# Patient Record
Sex: Female | Born: 1945
Health system: Southern US, Community
[De-identification: ages and names within clinical notes are randomized; demographics above are authoritative.]

## PROBLEM LIST (undated history)

## (undated) DIAGNOSIS — E785 Hyperlipidemia, unspecified: Secondary | ICD-10-CM

## (undated) DIAGNOSIS — I4719 Other supraventricular tachycardia: Secondary | ICD-10-CM

## (undated) DIAGNOSIS — F419 Anxiety disorder, unspecified: Secondary | ICD-10-CM

## (undated) DIAGNOSIS — Z5189 Encounter for other specified aftercare: Secondary | ICD-10-CM

## (undated) DIAGNOSIS — M1990 Unspecified osteoarthritis, unspecified site: Secondary | ICD-10-CM

## (undated) DIAGNOSIS — I10 Essential (primary) hypertension: Secondary | ICD-10-CM

## (undated) DIAGNOSIS — N1831 Chronic kidney disease, stage 3a: Secondary | ICD-10-CM

## (undated) DIAGNOSIS — IMO0002 Reserved for concepts with insufficient information to code with codable children: Secondary | ICD-10-CM

## (undated) DIAGNOSIS — K2270 Barrett's esophagus without dysplasia: Secondary | ICD-10-CM

## (undated) DIAGNOSIS — K219 Gastro-esophageal reflux disease without esophagitis: Secondary | ICD-10-CM

## (undated) DIAGNOSIS — M810 Age-related osteoporosis without current pathological fracture: Secondary | ICD-10-CM

## (undated) DIAGNOSIS — I491 Atrial premature depolarization: Secondary | ICD-10-CM

## (undated) DIAGNOSIS — I517 Cardiomegaly: Secondary | ICD-10-CM

## (undated) DIAGNOSIS — I2510 Atherosclerotic heart disease of native coronary artery without angina pectoris: Secondary | ICD-10-CM

## (undated) DIAGNOSIS — N200 Calculus of kidney: Secondary | ICD-10-CM

## (undated) DIAGNOSIS — H269 Unspecified cataract: Secondary | ICD-10-CM

## (undated) DIAGNOSIS — K449 Diaphragmatic hernia without obstruction or gangrene: Secondary | ICD-10-CM

## (undated) DIAGNOSIS — D689 Coagulation defect, unspecified: Secondary | ICD-10-CM

## (undated) HISTORY — DX: Diaphragmatic hernia without obstruction or gangrene: K44.9

## (undated) HISTORY — DX: Unspecified osteoarthritis, unspecified site: M19.90

## (undated) HISTORY — DX: Anxiety disorder, unspecified: F41.9

## (undated) HISTORY — DX: Chronic kidney disease, stage 3a: N18.31

## (undated) HISTORY — DX: Barrett's esophagus without dysplasia: K22.70

## (undated) HISTORY — DX: Age-related osteoporosis without current pathological fracture: M81.0

## (undated) HISTORY — PX: COLOSTOMY: SHX63

## (undated) HISTORY — DX: Unspecified cataract: H26.9

## (undated) HISTORY — PX: ABDOMINAL HYSTERECTOMY: SHX81

## (undated) HISTORY — DX: Other supraventricular tachycardia: I47.19

## (undated) HISTORY — DX: Atrial premature depolarization: I49.1

## (undated) HISTORY — PX: UPPER GASTROINTESTINAL ENDOSCOPY: SHX188

## (undated) HISTORY — PX: LITHOTRIPSY: SUR834

## (undated) HISTORY — DX: Cardiomegaly: I51.7

## (undated) HISTORY — DX: Encounter for other specified aftercare: Z51.89

## (undated) HISTORY — DX: Essential (primary) hypertension: I10

## (undated) HISTORY — DX: Hyperlipidemia, unspecified: E78.5

## (undated) HISTORY — DX: Atherosclerotic heart disease of native coronary artery without angina pectoris: I25.10

## (undated) HISTORY — PX: EYE SURGERY: SHX253

## (undated) HISTORY — DX: Calculus of kidney: N20.0

## (undated) HISTORY — DX: Reserved for concepts with insufficient information to code with codable children: IMO0002

## (undated) HISTORY — DX: Coagulation defect, unspecified: D68.9

## (undated) HISTORY — PX: HERNIA REPAIR: SHX51

---
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
## 2007-03-26 HISTORY — PX: CATARACT EXTRACTION: SUR2

## 2009-02-04 ENCOUNTER — Other Ambulatory Visit: Payer: Self-pay | Admitting: Emergency Medicine

## 2009-02-04 ENCOUNTER — Encounter: Payer: Self-pay | Admitting: Internal Medicine

## 2009-02-04 LAB — CONVERTED CEMR LAB
ALT: 20 units/L
Albumin: 4.3 g/dL
Alkaline Phosphatase: 101 units/L
Basophils Relative: 0 %
CO2: 40 meq/L
Creatinine, Ser: 1.5 mg/dL
Eosinophils Relative: 2 %
HCT: 47.9 %
Neutrophils Relative %: 71 %
RBC: 5.2 M/uL
Total Bilirubin: 0.7 mg/dL
WBC: 10.7 10*3/uL

## 2009-02-05 ENCOUNTER — Inpatient Hospital Stay (HOSPITAL_COMMUNITY): Admission: EM | Admit: 2009-02-05 | Discharge: 2009-02-07 | Payer: Self-pay | Admitting: Internal Medicine

## 2009-02-05 ENCOUNTER — Other Ambulatory Visit: Payer: Self-pay | Admitting: Emergency Medicine

## 2009-02-05 ENCOUNTER — Encounter: Payer: Self-pay | Admitting: Internal Medicine

## 2009-02-05 DIAGNOSIS — R55 Syncope and collapse: Secondary | ICD-10-CM

## 2009-02-05 LAB — CONVERTED CEMR LAB
BUN: 21 mg/dL
Chloride: 93 meq/L
Glucose, Bld: 111 mg/dL
MCV: 92.2 fL
Platelets: 288 10*3/uL
TSH: 2.532 microintl units/mL

## 2009-02-06 ENCOUNTER — Encounter (INDEPENDENT_AMBULATORY_CARE_PROVIDER_SITE_OTHER): Payer: Self-pay | Admitting: Internal Medicine

## 2009-02-06 ENCOUNTER — Ambulatory Visit: Payer: Self-pay | Admitting: Vascular Surgery

## 2009-02-14 ENCOUNTER — Encounter: Payer: Self-pay | Admitting: Internal Medicine

## 2009-02-14 DIAGNOSIS — K449 Diaphragmatic hernia without obstruction or gangrene: Secondary | ICD-10-CM | POA: Insufficient documentation

## 2009-02-14 DIAGNOSIS — I10 Essential (primary) hypertension: Secondary | ICD-10-CM | POA: Insufficient documentation

## 2009-02-20 ENCOUNTER — Encounter (INDEPENDENT_AMBULATORY_CARE_PROVIDER_SITE_OTHER): Payer: Self-pay | Admitting: *Deleted

## 2009-02-20 ENCOUNTER — Ambulatory Visit: Payer: Self-pay | Admitting: Internal Medicine

## 2009-02-20 DIAGNOSIS — K219 Gastro-esophageal reflux disease without esophagitis: Secondary | ICD-10-CM | POA: Insufficient documentation

## 2009-02-20 DIAGNOSIS — Z87442 Personal history of urinary calculi: Secondary | ICD-10-CM | POA: Insufficient documentation

## 2009-02-20 DIAGNOSIS — F4321 Adjustment disorder with depressed mood: Secondary | ICD-10-CM

## 2009-02-20 DIAGNOSIS — Z9189 Other specified personal risk factors, not elsewhere classified: Secondary | ICD-10-CM | POA: Insufficient documentation

## 2009-02-21 ENCOUNTER — Encounter (INDEPENDENT_AMBULATORY_CARE_PROVIDER_SITE_OTHER): Payer: Self-pay | Admitting: *Deleted

## 2009-03-13 ENCOUNTER — Telehealth: Payer: Self-pay | Admitting: Internal Medicine

## 2009-03-23 ENCOUNTER — Ambulatory Visit: Payer: Self-pay | Admitting: Internal Medicine

## 2009-03-25 HISTORY — PX: HIATAL HERNIA REPAIR: SHX195

## 2009-03-29 ENCOUNTER — Telehealth: Payer: Self-pay | Admitting: Gastroenterology

## 2009-03-29 ENCOUNTER — Encounter: Payer: Self-pay | Admitting: Internal Medicine

## 2009-04-12 ENCOUNTER — Ambulatory Visit: Payer: Self-pay | Admitting: Gastroenterology

## 2009-04-14 ENCOUNTER — Ambulatory Visit: Payer: Self-pay | Admitting: Gastroenterology

## 2009-04-18 ENCOUNTER — Encounter: Payer: Self-pay | Admitting: Gastroenterology

## 2009-04-20 ENCOUNTER — Telehealth: Payer: Self-pay | Admitting: Internal Medicine

## 2009-04-21 ENCOUNTER — Inpatient Hospital Stay (HOSPITAL_COMMUNITY): Admission: AD | Admit: 2009-04-21 | Discharge: 2009-04-25 | Payer: Self-pay | Admitting: Surgery

## 2009-05-12 ENCOUNTER — Ambulatory Visit: Payer: Self-pay | Admitting: Gastroenterology

## 2009-05-17 ENCOUNTER — Encounter: Payer: Self-pay | Admitting: Gastroenterology

## 2009-06-30 ENCOUNTER — Encounter: Payer: Self-pay | Admitting: Internal Medicine

## 2009-10-03 ENCOUNTER — Ambulatory Visit: Payer: Self-pay | Admitting: Internal Medicine

## 2009-10-31 ENCOUNTER — Ambulatory Visit: Payer: Self-pay | Admitting: Internal Medicine

## 2010-03-12 ENCOUNTER — Encounter: Payer: Self-pay | Admitting: Gastroenterology

## 2010-04-19 ENCOUNTER — Encounter: Payer: Self-pay | Admitting: Gastroenterology

## 2010-04-24 NOTE — Assessment & Plan Note (Signed)
Summary: 4 wk fu  stc   Vital Signs:  Patient profile:   65 year old female Height:      69 inches (175.26 cm) Weight:      195.12 pounds (88.69 kg) O2 Sat:      96 % on Room air Temp:     98.9 degrees F (37.17 degrees C) oral Pulse rate:   72 / minute BP sitting:   132 / 76  (left arm) Cuff size:   large  Vitals Entered By: Orlan Leavens RMA (October 31, 2009 3:53 PM)  O2 Flow:  Room air CC: 4 week follow-up Is Patient Diabetic? No Pain Assessment Patient in pain? no        Primary Care Provider:  Newt Lukes MD  CC:  4 week follow-up.  History of Present Illness: here for f/u -  HTN - reports compliance with ongoing medical treatment and no changes in medication dose or frequency. denies adverse side effects related to current therapy. believes usually controlled but now up due to stress - see next  insomnia  - worse with stress (moving demented spouse to nursing facility s/p hospitalization) - also increase stressors also at work - using temp xanax with good relief of symptoms -denies depression - no unexplained sadness, deep despair or unexplained tearfulness -  GERD - surg for [facility] reviewed - no daily symptoms - no meds at this time - for GI f/u soon  Clinical Review Panels:  CBC   WBC:  9.6 (02/05/2009)   RBC:  4.76 (02/05/2009)   Hgb:  14.9 (02/05/2009)   Hct:  43.9 (02/05/2009)   Platelets:  288 (02/05/2009)   MCV  92.2 (02/05/2009)   RDW  13.8 (02/05/2009)   PMN:  71 (02/04/2009)   Monos:  10 (02/04/2009)   Eosinophils:  2 (02/04/2009)   Basophil:  0 (02/04/2009)  Complete Metabolic Panel   Glucose:  111 (02/05/2009)   Sodium:  142 (02/05/2009)   Potassium:  3.3 (02/05/2009)   Chloride:  93 (02/05/2009)   CO2:  40 (02/05/2009)   BUN:  21 (02/05/2009)   Creatinine:  1.67 (02/05/2009)   Albumin:  4.3 (02/04/2009)   Total Protein:  8.0 (02/04/2009)   Calcium:  9.3 (02/05/2009)   Total Bili:  0.7 (02/04/2009)   Alk Phos:  101 (02/04/2009)  SGPT (ALT):  20 (02/04/2009)   SGOT (AST):  22 (02/04/2009)   Current Medications (verified): 1)  Amlodipine Besylate 2.5 Mg Tabs (Amlodipine Besylate) .Marland Kitchen.. 1 By Mouth Once Daily 2)  Alprazolam 0.5 Mg Tabs (Alprazolam) .Marland Kitchen.. 1 By Mouth Three Times A Day As Needed  Allergies (verified): No Known Drug Allergies  Past History:  Past Medical History: Hypertension GERD with HH (large)   MD roster: GI - [facility] - [facility]in  Review of Systems  The patient denies fever, vision loss, chest pain, and headaches.    Physical Exam  General:  alert, well-developed, well-nourished, and cooperative to examination.    Lungs:  normal respiratory effort, no intercostal retractions or use of accessory muscles; normal breath sounds bilaterally - no crackles and no wheezes.    Heart:  normal rate, regular rhythm, no murmur, and no rub. BLE without edema.  Psych:  Oriented X3, memory intact for recent and remote, normally interactive, good eye contact, not anxious appearing, not depressed appearing, and not agitated.     Impression & Recommendations:  Problem # 1:  HYPERTENSION (ICD-401.9) Assessment Improved  Her updated medication list for  this problem includes:    Amlodipine Besylate 2.5 Mg Tabs (Amlodipine besylate) .Marland Kitchen... 1 by mouth once daily  improved from pretx but inc now - ?stress related -  if not improved next OV, consider titrate amlodipine, add diuretic or other antiHTN med d/w pt who agrees  BP today: 132/76 Prior BP: 148/92 (10/03/2009)  Labs Reviewed: K+: 3.3 (02/05/2009) Creat: : 1.67 (02/05/2009)     Problem # 2:  DEPRESSION, SITUATIONAL (ICD-309.0)  sig stress and anxiety re: decline in spouse health and memory  previously stopped SSRI as not helpful and was not taking - (celexa) now using short term xanax  as needed - support provided  Complete Medication List: 1)  Amlodipine Besylate 2.5 Mg Tabs (Amlodipine besylate) .Marland Kitchen.. 1 by mouth once daily 2)   Alprazolam 0.5 Mg Tabs (Alprazolam) .Marland Kitchen.. 1 by mouth three times a day as needed  Patient Instructions: 1)  it was good to see you today. 2)  continue the low dose temporary xanax to use as discussed for stress and sleep - use as needed  - 3)  blood pressure looks better today - keep on amlodipine for blood pressure control 4)  Please schedule a follow-up appointment in 3-36months  to recheck blood pressure and stress, call sooner if problems.

## 2010-04-24 NOTE — Letter (Signed)
Summary: [facility]   Imported By: Lester Jermyn 07/17/2009 10:37:23  _____________________________________________________________________  External Attachment:    Type:   Image     Comment:   External Document

## 2010-04-24 NOTE — Assessment & Plan Note (Signed)
Summary: PER PT 6 MTH FU  STC  RS'D PER PT/NWS   Vital Signs:  Patient profile:   65 year old female Height:      69 inches (175.26 cm) Weight:      194.12 pounds (88.24 kg) O2 Sat:      95 % on Room air Temp:     98.8 degrees F (37.11 degrees C) oral Pulse rate:   66 / minute BP sitting:   148 / 92  (left arm) Cuff size:   large  Vitals Entered By: Orlan Leavens (October 03, 2009 3:46 PM)  O2 Flow:  Room air CC: 6 month follow-up Is Patient Diabetic? No Pain Assessment Patient in pain? no        Primary Care Provider:  Newt Lukes MD  CC:  6 month follow-up.  History of Present Illness: here for f/u -  HTN - reports compliance with ongoing medical treatment and no changes in medication dose or frequency. denies adverse side effects related to current therapy. believes usually controlled but now up due to stress - see next  insomnia  - worse in last few weeks with stress - moving demented spouse to nursing facility - increase stressors also at work - has uses temp xanax in past with good relief of symptoms - ?try again now - demnies depression - no unexplained sadness, deep despair or unexplained tearfulness -  GERD - surg for [facility] reviewed - no daily symptoms - no meds at this time - for GI f/u soon  Clinical Review Panels:  CBC   WBC:  9.6 (02/05/2009)   RBC:  4.76 (02/05/2009)   Hgb:  14.9 (02/05/2009)   Hct:  43.9 (02/05/2009)   Platelets:  288 (02/05/2009)   MCV  92.2 (02/05/2009)   RDW  13.8 (02/05/2009)   PMN:  71 (02/04/2009)   Monos:  10 (02/04/2009)   Eosinophils:  2 (02/04/2009)   Basophil:  0 (02/04/2009)  Complete Metabolic Panel   Glucose:  111 (02/05/2009)   Sodium:  142 (02/05/2009)   Potassium:  3.3 (02/05/2009)   Chloride:  93 (02/05/2009)   CO2:  40 (02/05/2009)   BUN:  21 (02/05/2009)   Creatinine:  1.67 (02/05/2009)   Albumin:  4.3 (02/04/2009)   Total Protein:  8.0 (02/04/2009)   Calcium:  9.3 (02/05/2009)   Total Bili:  0.7  (02/04/2009)   Alk Phos:  101 (02/04/2009)   SGPT (ALT):  20 (02/04/2009)   SGOT (AST):  22 (02/04/2009)   Current Medications (verified): 1)  Amlodipine Besylate 2.5 Mg Tabs (Amlodipine Besylate) .Marland Kitchen.. 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    Physical Exam  General:  alert, well-developed, well-nourished, and cooperative to examination.    Lungs:  normal respiratory effort, no intercostal retractions or use of accessory muscles; normal breath sounds bilaterally - no crackles and no wheezes.    Heart:  normal rate, regular rhythm, no murmur, and no rub. BLE without edema.  Psych:  Oriented X3, memory intact for recent and remote, normally interactive, good eye contact, not anxious appearing, not depressed appearing, and not agitated.   occ tearful discussing situation with spouse moving to nursing home   Impression & Recommendations:  Problem # 1:  HYPERTENSION (ICD-401.9) improved from pretx but inc now - ?stress related - tx anxiety/stress adn recheckfew weeks - if not improved titrate amlodipine, add diuretic or other antiHTN med d/w pt who agrees Her updated medication list for this problem includes:    Amlodipine Besylate  2.5 Mg Tabs (Amlodipine besylate) .Marland Kitchen... 1 by mouth once daily  BP today: 148/92 Prior BP: 130/62 (05/12/2009)  Labs Reviewed: K+: 3.3 (02/05/2009) Creat: : 1.67 (02/05/2009)     Problem # 2:  DEPRESSION, SITUATIONAL (ICD-309.0) sig stress and anxiety re: decline in spouse health and memory  prev stop SSRI as not helpful and was not taking - (celexa) short term xanax to use as needed - support provided  Problem # 3:  GERD (ICD-530.81)  dx by symptoms and CT changes -  s/p hernia surg by  dr. Daphine Deutscher 05/2009 -  f/u GI as planned  Complete Medication List: 1)  Amlodipine Besylate 2.5 Mg Tabs (Amlodipine besylate) .Marland Kitchen.. 1 by mouth once daily 2)  Alprazolam 0.5 Mg Tabs (Alprazolam) .Marland Kitchen.. 1 by mouth three times a day as needed  Patient Instructions: 1)  it was good to see you today. 2)  temporary xanax to use as discussed for stress and sleep - use as needed  - your prescription has been given to submit to your pharmacy. Please take as directed. Contact our office if you believe you're having problems with the medication(s).  3)  Please schedule a follow-up appointment in 4 weeks to recheck blood pressure, sooner if problems.  4)  good luck Prescriptions: ALPRAZOLAM 0.5 MG TABS (ALPRAZOLAM) 1 by mouth three times a day as needed  #40 x 1   Entered and Authorized by:   Newt Lukes MD   Signed by:   Newt Lukes MD on 10/03/2009   Method used:   Print then Give to Patient   RxID:   [id number]

## 2010-04-24 NOTE — Letter (Signed)
Summary: [facility]   Imported By: Sherian Rein 05/31/2009 14:19:28  _____________________________________________________________________  External Attachment:    Type:   Image     Comment:   External Document

## 2010-04-24 NOTE — Progress Notes (Signed)
Summary: ? egd  Phone Note Call from Patient Call back at [phone]   Caller: Patient Call For: Sara [name] Summary of Call: Dr. Daphine Deutscher and he suggested that she has a repeat EGD befre he repairs her [facility]. She would like to set that up. I that Sara [name] will have to get the note from [facility] and have Dr. Christella Hartigan look at it then give her a call back.  Initial call taken by: Harlow Mares CMA (AAMA),  March 29, 2009 12:58 PM  Follow-up for Phone Call        I spoke with the pt and she is going to have [facility] send over her records for Dr Christella Hartigan review. Follow-up by: Chales Abrahams CMA Duncan Dull),  March 29, 2009 1:48 PM     Appended Document: ? egd received faxed note from Dr. Daphine Deutscher about hiatal hernia repair surgery and ? of EGD before the surgery.  I see she has cancelled apts with myself last week and with Dr. Sheryn Bison 2-3 months ago.  Please call her, ask if she is still interested in office visit to discuss EGD.  Appended Document: ? egd appt scheduled for 04/12/09

## 2010-04-24 NOTE — Assessment & Plan Note (Signed)
  Review of gastrointestinal problems: 1. Barrett's esophagus, 10 cm, diagnosed originally January 2011. Biopsies showed intestinal metaplasia without dysplasia. Next EGD January 2012 2. large hiatal hernia, surgically repaired January 28th, 2011.  This significantly improved her SOB, energy level, eating more normally (although only on puree's still).    History of Present Illness Visit Type: Follow-up Visit Primary GI MD: Rob Bunting MD Primary Provider: Newt Lukes MD Requesting Provider: Renato Battles Chief Complaint: 4-5 week f/u History of Present Illness:      Very pleasant 65 year old woman whom I last saw about a month ago time of her EGD, preoperative before her very large symptomatic hiatal hernia was repaired by Dr. Luretha Murphy.  She has felt very well since the surgery however her diet has not been advanced to normal solid foods yet. She is much less short of breath, her stomach was impinging on her lung function.           Current Medications (verified): 1)  Amlodipine Besylate 2.5 Mg Tabs (Amlodipine Besylate) .Marland Kitchen.. 1 By Mouth Once Daily  Allergies (verified): No Known Drug Allergies  Vital Signs:  Patient profile:   65 year old female Height:      69 inches Weight:      176.50 pounds BMI:     26.16 Pulse rate:   72 / minute Pulse rhythm:   regular BP sitting:   130 / 62  (left arm)  Vitals Entered By: Chales Abrahams CMA Duncan Dull) (May 12, 2009 2:47 PM)  Physical Exam  Additional Exam:  Constitutional: generally well appearing Psychiatric: alert and oriented times 3 Abdomen: soft, non-tender, non-distended, normal bowel sounds    Impression & Recommendations:  Problem # 1:  Barrett's esophagus she has a long segment of Barrett's esophagus. I discussed with her that this probably puts her at increased risk for esophageal cancer and told her that this was probably about 0.5% per year. She understands that there is debate about how to  follow people with Barrett's esophagus such as hers and I recommended that we repeat her upper endoscopy at one year from now. At that point if no dysplasia is noted then I would probably put her out to a 3 year interval. She understands to call sooner if she has any concerns including worsening dysphasia, unexplained weight loss.  Patient Instructions: 1)  You will have repeat EGD in one year. 2)  Call Dr. Christella Hartigan office sooner if any concerns arise. 3)  A copy of this information will be sent to Drs. Maryjean Morn. 4)  The medication list was reviewed and reconciled.  All changed / newly prescribed medications were explained.  A complete medication list was provided to the patient / caregiver.

## 2010-04-24 NOTE — Letter (Signed)
Summary: EGD Instructions  [facility]  [address] [place], [place] [address]   Phone: [phone]  Fax: [phone]       Sara [name]    March 13, 1946    MRN: [id number]       Procedure Day /Date:04/14/09     Arrival Time: 730 am     Procedure Time:830 am     Location of Procedure:                    [facility] ([facility])   PREPARATION FOR ENDOSCOPY   On1/21/11THE DAY OF THE PROCEDURE:  1.   No solid foods, milk or milk products are allowed after midnight the night before your procedure.  2.   Do not drink anything colored red or purple.  Avoid juices with pulp.  No orange juice.  3.  You may drink clear liquids until630 am, which is 2 hours before your procedure.                                                                                                CLEAR LIQUIDS INCLUDE: Water Jello Ice Popsicles Tea (sugar ok, no milk/cream) Powdered fruit flavored drinks Coffee (sugar ok, no milk/cream) Gatorade Juice: apple, white grape, white cranberry  Lemonade Clear bullion, consomm, broth Carbonated beverages (any kind) Strained chicken noodle soup Hard Candy   MEDICATION INSTRUCTIONS  Unless otherwise instructed, you should take regular prescription medications with a small sip of water as early as possible the morning of your procedure.               OTHER INSTRUCTIONS  You will need a responsible adult at least 63 years of age to accompany you and drive you home.   This person must remain in the waiting room during your procedure.  Wear loose fitting clothing that is easily removed.  Leave jewelry and other valuables at home.  However, you may wish to bring a book to read or an iPod/MP3 player to listen to music as you wait for your procedure to start.  Remove all body piercing jewelry and leave at home.  Total time from sign-in until discharge is approximately 2-3 hours.  You should go home directly after your  procedure and rest.  You can resume normal activities the day after your procedure.  The day of your procedure you should not:   Drive   Make legal decisions   Operate machinery   Drink alcohol   Return to work  You will receive specific instructions about eating, activities and medications before you leave.    The above instructions have been reviewed and explained to me by   _______________________    I fully understand and can verbalize these instructions _____________________________ Date _________

## 2010-04-24 NOTE — Progress Notes (Signed)
Summary: BP side effect  Phone Note Call from Patient Call back at Home Phone [phone]   Caller: Patient Summary of Call: pt called stating that she has not had any problems with BP meds until this week when her legs started to swell. Pt is scheduled to have surgery tomorrow and had her pre-op appt today and was told that she may need to have BP medications changed. Initial call taken by: Margaret Pyle, CMA,  April 20, 2009 10:06 AM  Follow-up for Phone Call        it may or may not be necessary to change medications -  if anesthesia (preop evaluator) has cancelled her surgery because of this issue, please have pt schedule OV here to review and make changes as needed  -  if she is still on for surg tomorrow, continue medication as is, have surgery and then make OV to review in the week post op - thanks Follow-up by: Newt Lukes MD,  April 20, 2009 10:48 AM  Additional Follow-up for Phone Call Additional follow up Details #1::        pt's surgery is still scheduled for tomorrow. Pt advised to continue meds and make OV. pt will call back after she is released from hospital to schedule Additional Follow-up by: Margaret Pyle, CMA,  April 20, 2009 11:00 AM

## 2010-04-24 NOTE — Letter (Signed)
Summary: Large hiatal hernia/[facility]  Large hiatal hernia/[facility]   Imported By: Sherian Rein 04/10/2009 11:38:21  _____________________________________________________________________  External Attachment:    Type:   Image     Comment:   External Document

## 2010-04-24 NOTE — Miscellaneous (Signed)
Summary: recall EGD  Clinical Lists Changes  Observations: Added new observation of EGD DUE: 04/2010 (05/12/2009 15:07)

## 2010-04-24 NOTE — Letter (Signed)
Summary: Results Letter  [facility]  [address] [place], [place] [address]   Phone: [phone]  Fax: [phone]        April 18, 2009 MRN: [id number]    Sara [name] [address] [place], [place]  [address]    Dear Ms. Sara [name],   The biopsies during your recent procedure showed Barrett's mucosa, but NO sign of the pre-cancerous change called "dysplasia."   Therefore, unless new symptoms arise you will not need another upper endoscopy for 1 year.  We will therefore put your information in our reminder system and will contact you in 1 year to schedule a repeat procedure.  Please call with any questions or concerns.       Sincerely,  Rachael Fee MD  This letter has been electronically signed by your physician.  Appended Document: Results Letter Letter mailed 1.27.11

## 2010-04-24 NOTE — Letter (Signed)
Summary: Appt Reminder 2   [facility]  [address] [place], [place] [address]   Phone: [phone]  Fax: [phone]        April 14, 2009 MRN: [id number]    Sara [name] [address] [place], [place]  [address]    Dear Ms. Sara [name],   You have a return appointment with Dr. Christella Hartigan on 05/12/09 at 3:00pm.  Please remember to bring a complete list of the medicines you are taking, your insurance card and your co-pay.  If you have to cancel or reschedule this appointment, please call before 5:00 pm the evening before to avoid a cancellation fee.  If you have any questions or concerns, please call [phone].    Sincerely,    Chales Abrahams CMA (AAMA)  Appended Document: Appt Reminder 2 letter mailed

## 2010-04-24 NOTE — Assessment & Plan Note (Signed)
History of Present Illness Visit Type: Initial Consult Primary GI MD: Rob Bunting MD Primary Provider: Newt Lukes MD Requesting Provider: Renato Battles Chief Complaint: discuss EGD History of Present Illness:      very pleasant 65 year old woman who has had approximately 3-4 months of increasingly worsening nausea and vomitting. She has modified her diet because solid foods will reliably cause vomitting, regurge.  workup for this revealed a very large hiatal hernia on upper GI barium test. She has approximately 2/3 of her stomach in her chest.  she does have intermittent pyrosis but this is very well controlled on once daily proton pump inhibitor.  She saw a Careers adviser, Dr. Luretha Murphy [facility] who is planning to perform hiatal hernia repair for her next Friday.  he sent her here to consider EGD preoperatively.  Having HH repair on 04/21/09.  on PPI for about 3 months, helps her mild pyrosis.  she has never had EGD before, she has never had colonoscopy for colon cancer screening.  No overt bleeding, no constipation, diarrhea.           Current Medications (verified): 1)  Protonix 40 Mg Tbec (Pantoprazole Sodium) .... Take 1 By Mouth Once Daily 2)  Amlodipine Besylate 2.5 Mg Tabs (Amlodipine Besylate) .Marland Kitchen.. 1 By Mouth Once Daily  Allergies (verified): No Known Drug Allergies  Past History:  Past Medical History: Hypertension GERD with HH (large)  Past Surgical History: Cataract extraction 2007-08-13) Tonsillectomy 1966/08/13)   Family History: Family History Breast cancer 1st degree relative <50 (grandparent) Family History Hypertension (parent) Heart disease (parent & grandparent) mom died 2006-08-13 with "cancer everywhere - bones" - ?stomach or other GI origin suspected but not confirmed before death dad died "massive heart attack"   Social History: Never Smoked married and lives with spouse (who has mild-mod dementia) 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  All other review of systems was otherwise negative.   Vital Signs:  Patient profile:   65 year old female Height:      69 inches Weight:      187 pounds BMI:     27.71 BSA:     2.01 Pulse rate:   88 / minute Pulse rhythm:   regular BP sitting:   122 / 82  (left arm)  Vitals Entered By: Merri Ray CMA (AAMA) (April 12, 2009 8:14 AM)  Physical Exam  Additional Exam:  Constitutional: generally well appearing Psychiatric: alert and oriented times 3 Eyes: extraocular movements intact Mouth: oropharynx moist, no lesions Neck: supple, no lymphadenopathy Cardiovascular: heart regular rate and rythm Lungs: CTA bilaterally Abdomen: soft, non-tender, non-distended, no obvious ascites, no peritoneal signs, normal bowel sounds Extremities: no lower extremity edema bilaterally Skin: no lesions on visible extremities    Impression & Recommendations:  Problem # 1:  Large hiatal hernia she has symptoms of regurgitation, nausea, vomiting that are likely do to this quite large hiatal hernia. I do agree with the plan for surgical repair. I think EGD preoperatively is a very good idea to rule out other pathology of her stomach, ulcers, significant gastritis that could also contribute to her symptoms. We will arrange for EGD to be done later this week.  Problem # 2:  routine risk for colon  cancer I offered colonoscopy for at the same time as her upper endoscopy, she wants to hold on this until after she is through with her hiatal hernia repair. She will contact my office when she is ready to proceed with screening colonoscopy.  Patient Instructions: 1)  You will be scheduled to have an upper endoscopy this Friday. 2)  A copy of this information will be sent to Dr. Daphine Deutscher. 3)  Please  contact Dr. Christella Hartigan office when you are ready to reconsider colonoscopy for colon cancer screening. 4)  The medication list was reviewed and reconciled.  All changed / newly prescribed medications were explained.  A complete medication list was provided to the patient / caregiver.  Appended Document: Orders Update/EGD    Clinical Lists Changes  Orders: Added new Test order of EGD (EGD) - Signed

## 2010-04-24 NOTE — Procedures (Signed)
Summary: Upper Endoscopy  Patient: Sara [name] Note: All result statuses are Final unless otherwise noted.  Tests: (1) Upper Endoscopy (EGD)   EGD Upper Endoscopy       DONE     [facility]     [address]     [place], [place]  [address]           ENDOSCOPY PROCEDURE REPORT           PATIENT:  Sara, [name]  MR#:  [id number]     BIRTHDATE:  1945-10-14, 63 yrs. old  GENDER:  female           ENDOSCOPIST:  Rachael Fee, MD     Referred by:  Luretha Murphy, M.D.           PROCEDURE DATE:  04/14/2009     PROCEDURE:  EGD with biopsy     ASA CLASS:  Class II     INDICATIONS:  GERD, known hiatal hernia, chest pains           MEDICATIONS:  Fentanyl 75 mcg IV, Versed 7 mg IV     TOPICAL ANESTHETIC:  Exactacain Spray           DESCRIPTION OF PROCEDURE:   After the risks benefits and     alternatives of the procedure were thoroughly explained, informed     consent was obtained.  The LB GIF-H180 [id number] endoscope was     introduced through the mouth and advanced to the second portion of     the duodenum, without limitations.  The instrument was slowly     withdrawn as the mucosa was fully examined.     <<PROCEDUREIMAGES>>           There was a very large hiatal hernia, the majority of stomach was     above the diaphragm. There were some minor superficial erosions     within the hernia but no clear ulcers (see image3 and image7).     There was abnormal, salmon colored mucosa from GE junction (33cm)     up to 23cm from incisors. There were no associated nodules or     masses. Biopsies were taken to check for Barrett's mucosda,     dysplasia. These were done in 4 quadrants, every two centimeters,     6 separate pathology jars (see image8, image9, and image1).     Otherwise the examination was normal (see image4, image5, and     image6).    Retroflexed views revealed no abnormalities.    The     scope was then withdrawn from the patient and the procedure      completed.           COMPLICATIONS:  None           ENDOSCOPIC IMPRESSION:     1) Very large hiatal hernia, majority of stomach is above     diaphragm     2) Long segment of non-nodular, Barrett's appearing mucosa;     extensively biopseid to check for intestinal metaplasia and     dysplasia     3) Otherwise normal examination           RECOMMENDATIONS:     1) OK to proceed with hiatal hernia repair next week with Dr.     Daphine Deutscher     2) Await final pathology to determin follow up EGD interval for     (likely) Barrett's mucosa     3)  Dr. Christella Hartigan office will contact you about follow up office appt     in 4-5 weeks (will discuss surveillance strategies for Barrett's,     other treatment options).           ______________________________     Rachael Fee, MD           cc: Rene Paci, MD           n.     eSIGNED:   Rachael Fee at 04/14/2009 09:04 AM           Alessandra Bevels, [id number]  Note: An exclamation mark (!) indicates a result that was not dispersed into the flowsheet. Document Creation Date: 04/14/2009 9:05 AM _______________________________________________________________________  (1) Order result status: Final Collection or observation date-time: 04/14/2009 08:55 Requested date-time:  Receipt date-time:  Reported date-time:  Referring Physician:   Ordering Physician: Rob Bunting [phone]) Specimen Source:  Source: Launa Grill Order Number: [id number] Lab site:

## 2010-04-26 NOTE — Letter (Signed)
Summary: Endoscopy Letter  [facility]  [address] [place], [place] [address]   Phone: [phone]  Fax: [phone]      April 19, 2010 MRN: [id number]   Sara [name] [address] [place], [place]  [address]   Dear Ms. Sara [name],   According to your medical record, it is time for you to schedule an Endoscopy. Endoscopic screening is recommended for patients with certain upper digestive tract conditions because of associated increased risk for cancers of the upper digestive system.  This letter has been generated based on the recommendations made at the time of your prior procedure. If you feel that in your particular situation this may no longer apply, please contact our office.  Please call our office at [phone]) to schedule this appointment or to update your records at your earliest convenience.  Thank you for cooperating with [place] to provide you with the very best care possible.   Sincerely,  Rachael Fee, M.D.  [facility] [phone]

## 2010-05-02 NOTE — Procedures (Signed)
Summary: Recall Assessment/[facility]  Recall Assessment/[facility]   Imported By: Sherian Rein 04/23/2010 11:53:28  _____________________________________________________________________  External Attachment:    Type:   Image     Comment:   External Document

## 2010-05-29 ENCOUNTER — Ambulatory Visit: Payer: Self-pay | Admitting: Internal Medicine

## 2010-06-11 LAB — BASIC METABOLIC PANEL
BUN: 12 mg/dL (ref 6–23)
Chloride: 106 mEq/L (ref 96–112)
Glucose, Bld: 95 mg/dL (ref 70–99)
Potassium: 4.3 mEq/L (ref 3.5–5.1)

## 2010-06-11 LAB — CBC
HCT: 35.1 % — ABNORMAL LOW (ref 36.0–46.0)
MCHC: 33.1 g/dL (ref 30.0–36.0)
MCV: 91.8 fL (ref 78.0–100.0)
Platelets: 206 10*3/uL (ref 150–400)
WBC: 10.4 10*3/uL (ref 4.0–10.5)

## 2010-06-11 LAB — DIFFERENTIAL
Eosinophils Absolute: 0 10*3/uL (ref 0.0–0.7)
Lymphocytes Relative: 6 % — ABNORMAL LOW (ref 12–46)
Lymphs Abs: 0.6 10*3/uL — ABNORMAL LOW (ref 0.7–4.0)
Monocytes Relative: 7 % (ref 3–12)
Neutrophils Relative %: 87 % — ABNORMAL HIGH (ref 43–77)

## 2010-06-27 LAB — DIFFERENTIAL
Basophils Relative: 0 % (ref 0–1)
Eosinophils Absolute: 0.2 10*3/uL (ref 0.0–0.7)
Neutro Abs: 7.7 10*3/uL (ref 1.7–7.7)
Neutrophils Relative %: 71 % (ref 43–77)

## 2010-06-27 LAB — CBC
MCHC: 33.8 g/dL (ref 30.0–36.0)
MCV: 92 fL (ref 78.0–100.0)
Platelets: 319 10*3/uL (ref 150–400)
RBC: 4.76 MIL/uL (ref 3.87–5.11)
RBC: 5.2 MIL/uL — ABNORMAL HIGH (ref 3.87–5.11)
RDW: 12.7 % (ref 11.5–15.5)
WBC: 9.6 10*3/uL (ref 4.0–10.5)

## 2010-06-27 LAB — CARDIAC PANEL(CRET KIN+CKTOT+MB+TROPI)
CK, MB: 1 ng/mL (ref 0.3–4.0)
CK, MB: 1.1 ng/mL (ref 0.3–4.0)
Relative Index: INVALID (ref 0.0–2.5)
Total CK: 64 U/L (ref 7–177)
Total CK: 71 U/L (ref 7–177)
Total CK: 81 U/L (ref 7–177)
Troponin I: 0.01 ng/mL (ref 0.00–0.06)

## 2010-06-27 LAB — COMPREHENSIVE METABOLIC PANEL
ALT: 20 U/L (ref 0–35)
AST: 20 U/L (ref 0–37)
Albumin: 3.6 g/dL (ref 3.5–5.2)
BUN: 21 mg/dL (ref 6–23)
BUN: 26 mg/dL — ABNORMAL HIGH (ref 6–23)
CO2: 40 mEq/L — ABNORMAL HIGH (ref 19–32)
CO2: 40 mEq/L — ABNORMAL HIGH (ref 19–32)
Calcium: 9.3 mg/dL (ref 8.4–10.5)
Chloride: 91 mEq/L — ABNORMAL LOW (ref 96–112)
Chloride: 93 mEq/L — ABNORMAL LOW (ref 96–112)
Creatinine, Ser: 1.5 mg/dL — ABNORMAL HIGH (ref 0.4–1.2)
Creatinine, Ser: 1.67 mg/dL — ABNORMAL HIGH (ref 0.4–1.2)
GFR calc Af Amer: 43 mL/min — ABNORMAL LOW (ref >60)
GFR calc non Af Amer: 31 mL/min — ABNORMAL LOW (ref >60)
GFR calc non Af Amer: 35 mL/min — ABNORMAL LOW (ref >60)
Glucose, Bld: 146 mg/dL — ABNORMAL HIGH (ref 70–99)
Potassium: 3 mEq/L — ABNORMAL LOW (ref 3.5–5.1)
Potassium: 3.3 mEq/L — ABNORMAL LOW (ref 3.5–5.1)
Total Bilirubin: 0.7 mg/dL (ref 0.3–1.2)

## 2010-06-27 LAB — POCT CARDIAC MARKERS
Troponin i, poc: <0.05 ng/mL (ref 0.00–0.09)
Troponin i, poc: <0.05 ng/mL (ref 0.00–0.09)

## 2010-06-27 LAB — TSH: TSH: 2.532 u[IU]/mL (ref 0.350–4.500)

## 2010-06-27 LAB — LIPASE, BLOOD: Lipase: 25 U/L (ref 11–59)

## 2010-07-31 ENCOUNTER — Ambulatory Visit: Payer: Self-pay | Admitting: Internal Medicine

## 2010-08-06 ENCOUNTER — Ambulatory Visit: Payer: Self-pay | Admitting: Internal Medicine

## 2010-09-24 ENCOUNTER — Encounter: Payer: Self-pay | Admitting: Internal Medicine

## 2010-11-07 ENCOUNTER — Ambulatory Visit: Payer: Self-pay | Admitting: Internal Medicine

## 2011-01-21 ENCOUNTER — Ambulatory Visit: Payer: Self-pay | Admitting: Internal Medicine

## 2011-04-23 LAB — BASIC METABOLIC PANEL
Creatinine: 0.9 mg/dL (ref 0.5–1.1)
Glucose: 98 mg/dL
Potassium: 4.3 mmol/L (ref 3.4–5.3)

## 2011-04-23 LAB — TSH: TSH: 1.19 u[IU]/mL (ref 0.41–5.90)

## 2011-04-23 LAB — HEPATIC FUNCTION PANEL
ALT: 13 U/L (ref 7–35)
AST: 14 U/L (ref 13–35)
Bilirubin, Total: 0.5 mg/dL

## 2011-04-23 LAB — CBC AND DIFFERENTIAL: Platelets: 450 10*3/uL — AB (ref 150–399)

## 2011-04-23 LAB — HEMOGLOBIN A1C: Hgb A1c MFr Bld: 5.7 % (ref 4.0–6.0)

## 2011-09-11 ENCOUNTER — Ambulatory Visit: Payer: Self-pay | Admitting: Internal Medicine

## 2011-10-16 ENCOUNTER — Ambulatory Visit: Payer: Self-pay | Admitting: Internal Medicine

## 2011-12-06 ENCOUNTER — Encounter: Payer: Self-pay | Admitting: Gastroenterology

## 2012-02-07 ENCOUNTER — Encounter: Payer: Self-pay | Admitting: Internal Medicine

## 2012-02-07 ENCOUNTER — Ambulatory Visit (INDEPENDENT_AMBULATORY_CARE_PROVIDER_SITE_OTHER): Payer: PRIVATE HEALTH INSURANCE | Admitting: Internal Medicine

## 2012-02-07 VITALS — BP 132/78 | HR 69 | Temp 98.3°F | Ht 67.0 in | Wt 200.4 lb

## 2012-02-07 DIAGNOSIS — Z0000 Encounter for general adult medical examination without abnormal findings: Secondary | ICD-10-CM

## 2012-02-07 DIAGNOSIS — I10 Essential (primary) hypertension: Secondary | ICD-10-CM

## 2012-02-07 DIAGNOSIS — Z1239 Encounter for other screening for malignant neoplasm of breast: Secondary | ICD-10-CM

## 2012-02-07 DIAGNOSIS — Z23 Encounter for immunization: Secondary | ICD-10-CM

## 2012-02-07 DIAGNOSIS — Z1231 Encounter for screening mammogram for malignant neoplasm of breast: Secondary | ICD-10-CM

## 2012-02-07 DIAGNOSIS — N814 Uterovaginal prolapse, unspecified: Secondary | ICD-10-CM

## 2012-02-07 MED ORDER — VITAMIN D 1000 UNITS PO TABS: 1000.0000 [IU] | ORAL_TABLET | Freq: Every day | ORAL | Status: DC

## 2012-02-07 NOTE — Assessment & Plan Note (Signed)
BP Readings from Last 3 Encounters:  02/07/12 132/78  10/31/09 132/76  10/03/09 148/92   The current medical regimen is effective;  continue present plan and medications.

## 2012-02-07 NOTE — Progress Notes (Signed)
Subjective:    Patient ID: Sara [name], female    DOB: [birth year], 66 y.o.   MRN: [id number]  HPI  Last OV 10/2009 patient is here today for annual physical. Patient feels well and has no complaints.  also reviewed chronic medical issues:  HTN - reports compliance with ongoing medical treatment and no changes in medication dose or frequency. denies adverse side effects related to current therapy.    GERD - hx surg for [facility] 2011 - no meds at this time -  ?uterine prolapse - pressure sensation, worse with Valsalva during BM - no urinary incontinence or overflow symptoms    Past Medical History  Diagnosis Date  . DEPRESSION, SITUATIONAL   . GERD   . HIATAL HERNIA   . HYPERTENSION   . SYNCOPE    Family History  Problem Relation Age of Onset  . Cancer Mother   . Hypertension Other     Parent  . Cancer Other     Breast, Grandparent  . Heart disease Other     parent, grandparent   History  Substance Use Topics  . Smoking status: Never Smoker   . Smokeless tobacco: Not on file     Comment: widowed since late 2011 (spouse had mod-adv dementia) 5 grown children, 3 nearby-,many g-kids  . Alcohol Use: Not on file   Review of Systems Constitutional: Negative for fever or weight change.  Respiratory: Negative for cough and shortness of breath.   Cardiovascular: Negative for chest pain or palpitations.  Gastrointestinal: Negative for abdominal pain, no bowel changes.  Musculoskeletal: Negative for gait problem or joint swelling.  Skin: Negative for rash.  Neurological: Negative for dizziness or headache.  No other specific complaints in a complete review of systems (except as listed in HPI above).     Objective:   Physical Exam BP 132/78  Pulse 69  Temp 98.3 F (36.8 C) (Oral)  Ht 5\' 7"  (1.702 m)  Wt 200 lb 6.4 oz (90.901 kg)  BMI 31.39 kg/m2  SpO2 94% Wt Readings from Last 3 Encounters:  02/07/12 200 lb 6.4 oz (90.901 kg)  10/31/09 195 lb 1.9 oz (88.505 kg)    10/03/09 194 lb 1.9 oz (88.052 kg)   Constitutional: She appears well-developed and well-nourished. No distress.  HENT: Head: Normocephalic and atraumatic. Ears: B TMs ok, no erythema or effusion; Nose: Nose normal. Mouth/Throat: Oropharynx is clear and moist. No oropharyngeal exudate.  Eyes: Conjunctivae and EOM are normal. Pupils are equal, round, and reactive to light. No scleral icterus.  Neck: Normal range of motion. Neck supple. No JVD present. No thyromegaly present.  Cardiovascular: Normal rate, regular rhythm and normal heart sounds.  No murmur heard. No BLE edema. Pulmonary/Chest: Effort normal and breath sounds normal. No respiratory distress. She has no wheezes.  Abdominal: Soft. Bowel sounds are normal. She exhibits no distension. There is no tenderness. no masses GU: defer to gyn Musculoskeletal: Normal range of motion, no joint effusions. No gross deformities Neurological: She is alert and oriented to person, place, and time. No cranial nerve deficit. Coordination normal.  Skin: Skin is warm and dry. No rash noted. No erythema.  Psychiatric: She has a normal mood and affect. Her behavior is normal. Judgment and thought content normal.     Lab Results  Component Value Date   WBC 10.4 04/22/2009   HGB 11.6* 04/22/2009   HCT 35.1* 04/22/2009   PLT 206 04/22/2009   GLUCOSE 95 04/20/2009   ALT 15 02/05/2009  AST 20 02/05/2009   NA 146* 04/20/2009   K 4.3 04/20/2009   CL 106 04/20/2009   CREATININE 0.80 04/20/2009   BUN 12 04/20/2009   CO2 30 04/20/2009   TSH 2.532 *Test methodology is 3rd generation TSH* 02/05/2009       Assessment & Plan:  CPX/v70.0 - Patient has been counseled on age-appropriate routine health concerns for screening and prevention. These are reviewed and up-to-date. Immunizations are up-to-date or declined. Labs ordered (to be done at [facility] thru work) and reviewed.  ?uterine prolapse - refer to gn for eval/tx of same  Also see problem list. Medications  and labs reviewed today.

## 2012-02-07 NOTE — Patient Instructions (Addendum)
It was good to see you today. We have reviewed your prior records including labs and tests today Medications reviewed, no changes at this time. Test(s) ordered today. Prescription given to take to work -Your results will be called to you after review, usually within 72hours after test completion. If any changes need to be made, you will be notified at that same time. we'll make referral to gynecologist. Our office will contact you regarding appointment(s) once made. Health Maintenance reviewed - flu shot given today - refer for mammogram -all recommended immunizations and age-appropriate screenings are up-to-date. we'll make referral for mammogram . Our office will contact you regarding appointment(s) once made. Health Maintenance, Females A healthy lifestyle and preventative care can promote health and wellness.  Maintain regular health, dental, and eye exams.   Eat a healthy diet. Foods like vegetables, fruits, whole grains, low-fat dairy products, and lean protein foods contain the nutrients you need without too many calories. Decrease your intake of foods high in solid fats, added sugars, and salt. Get information about a proper diet from your caregiver, if necessary.   Regular physical exercise is one of the most important things you can do for your health. Most adults should get at least 150 minutes of moderate-intensity exercise (any activity that increases your heart rate and causes you to sweat) each week. In addition, most adults need muscle-strengthening exercises on 2 or more days a week.     Maintain a healthy weight. The body mass index (BMI) is a screening tool to identify possible weight problems. It provides an estimate of body fat based on height and weight. Your caregiver can help determine your BMI, and can help you achieve or maintain a healthy weight. For adults 20 years and older:   A BMI below 18.5 is considered underweight.   A BMI of 18.5 to 24.9 is normal.   A BMI of  25 to 29.9 is considered overweight.   A BMI of 30 and above is considered obese.   Maintain normal blood lipids and cholesterol by exercising and minimizing your intake of saturated fat. Eat a balanced diet with plenty of fruits and vegetables. Blood tests for lipids and cholesterol should begin at age 1 and be repeated every 5 years. If your lipid or cholesterol levels are high, you are over 50, or you are a high risk for heart disease, you may need your cholesterol levels checked more frequently. Ongoing high lipid and cholesterol levels should be treated with medicines if diet and exercise are not effective.   If you smoke, find out from your caregiver how to quit. If you do not use tobacco, do not start.   If you are pregnant, do not drink alcohol. If you are breastfeeding, be very cautious about drinking alcohol. If you are not pregnant and choose to drink alcohol, do not exceed 1 drink per day. One drink is considered to be 12 ounces (355 mL) of beer, 5 ounces (148 mL) of wine, or 1.5 ounces (44 mL) of liquor.   Avoid use of street drugs. Do not share needles with anyone. Ask for help if you need support or instructions about stopping the use of drugs.   High blood pressure causes heart disease and increases the risk of stroke. Blood pressure should be checked at least every 1 to 2 years. Ongoing high blood pressure should be treated with medicines, if weight loss and exercise are not effective.   If you are 63 to 64 years old,  ask your caregiver if you should take aspirin to prevent strokes.   Diabetes screening involves taking a blood sample to check your fasting blood sugar level. This should be done once every 3 years, after age 81, if you are within normal weight and without risk factors for diabetes. Testing should be considered at a younger age or be carried out more frequently if you are overweight and have at least 1 risk factor for diabetes.   Breast cancer screening is essential  preventative care for women. You should practice "breast self-awareness." This means understanding the normal appearance and feel of your breasts and may include breast self-examination. Any changes detected, no matter how small, should be reported to a caregiver. Women in their 42s and 30s should have a clinical breast exam (CBE) by a caregiver as part of a regular health exam every 1 to 3 years. After age 54, women should have a CBE every year. Starting at age 90, women should consider having a mammogram (breast X-ray) every year. Women who have a family history of breast cancer should talk to their caregiver about genetic screening. Women at a high risk of breast cancer should talk to their caregiver about having an MRI and a mammogram every year.   The Pap test is a screening test for cervical cancer. Women should have a Pap test starting at age 12. Between ages 72 and 48, Pap tests should be repeated every 2 years. Beginning at age 82, you should have a Pap test every 3 years as long as the past 3 Pap tests have been normal. If you had a hysterectomy for a problem that was not cancer or a condition that could lead to cancer, then you no longer need Pap tests. If you are between ages 19 and 13, and you have had normal Pap tests going back 10 years, you no longer need Pap tests. If you have had past treatment for cervical cancer or a condition that could lead to cancer, you need Pap tests and screening for cancer for at least 20 years after your treatment. If Pap tests have been discontinued, risk factors (such as a new sexual partner) need to be reassessed to determine if screening should be resumed. Some women have medical problems that increase the chance of getting cervical cancer. In these cases, your caregiver may recommend more frequent screening and Pap tests.   The human papillomavirus (HPV) test is an additional test that may be used for cervical cancer screening. The HPV test looks for the virus  that can cause the cell changes on the cervix. The cells collected during the Pap test can be tested for HPV. The HPV test could be used to screen women aged 21 years and older, and should be used in women of any age who have unclear Pap test results. After the age of 25, women should have HPV testing at the same frequency as a Pap test.   Colorectal cancer can be detected and often prevented. Most routine colorectal cancer screening begins at the age of 7 and continues through age 14. However, your caregiver may recommend screening at an earlier age if you have risk factors for colon cancer. On a yearly basis, your caregiver may provide home test kits to check for hidden blood in the stool. Use of a small camera at the end of a tube, to directly examine the colon (sigmoidoscopy or colonoscopy), can detect the earliest forms of colorectal cancer. Talk to your caregiver about this  at age 55, when routine screening begins. Direct examination of the colon should be repeated every 5 to 10 years through age 53, unless early forms of pre-cancerous polyps or small growths are found.   Hepatitis C blood testing is recommended for all people born from [birth year] through 1963 and any individual with known risks for hepatitis C.   Practice safe sex. Use condoms and avoid high-risk sexual practices to reduce the spread of sexually transmitted infections (STIs). Sexually active women aged 91 and younger should be checked for Chlamydia, which is a common sexually transmitted infection. Older women with new or multiple partners should also be tested for Chlamydia. Testing for other STIs is recommended if you are sexually active and at increased risk.   Osteoporosis is a disease in which the bones lose minerals and strength with aging. This can result in serious bone fractures. The risk of osteoporosis can be identified using a bone density scan. Women ages 71 and over and women at risk for fractures or osteoporosis should  discuss screening with their caregivers. Ask your caregiver whether you should be taking a calcium supplement or vitamin D to reduce the rate of osteoporosis.   Menopause can be associated with physical symptoms and risks. Hormone replacement therapy is available to decrease symptoms and risks. You should talk to your caregiver about whether hormone replacement therapy is right for you.   Use sunscreen with a sun protection factor (SPF) of 30 or greater. Apply sunscreen liberally and repeatedly throughout the day. You should seek shade when your shadow is shorter than you. Protect yourself by wearing long sleeves, pants, a wide-brimmed hat, and sunglasses year round, whenever you are outdoors.   Notify your caregiver of new moles or changes in moles, especially if there is a change in shape or color. Also notify your caregiver if a mole is larger than the size of a pencil eraser.   Stay current with your immunizations.  Document Released: 09/24/2010 Document Revised: 06/03/2011 Document Reviewed: 09/24/2010 [facility] Patient Information 2013 [place], [place].

## 2012-02-25 LAB — LIPID PANEL: LDL Cholesterol: 118 mg/dL

## 2012-02-25 LAB — HEPATIC FUNCTION PANEL
AST: 13 U/L (ref 13–35)
Alkaline Phosphatase: 84 U/L (ref 25–125)
Bilirubin, Total: 0.4 mg/dL

## 2012-02-25 LAB — CBC AND DIFFERENTIAL
HCT: 41 % (ref 36–46)
Hemoglobin: 14.2 g/dL (ref 12.0–16.0)
Platelets: 280 10*3/uL (ref 150–399)
WBC: 8.7 10^3/mL

## 2012-02-27 ENCOUNTER — Encounter: Payer: Self-pay | Admitting: Internal Medicine

## 2012-03-13 ENCOUNTER — Ambulatory Visit
Admission: RE | Admit: 2012-03-13 | Discharge: 2012-03-13 | Disposition: A | Discharge status: Home / Self Care | Payer: PRIVATE HEALTH INSURANCE | Source: Ambulatory Visit | Attending: Internal Medicine | Admitting: Internal Medicine

## 2012-03-13 DIAGNOSIS — Z1231 Encounter for screening mammogram for malignant neoplasm of breast: Secondary | ICD-10-CM

## 2012-05-29 ENCOUNTER — Encounter (HOSPITAL_COMMUNITY): Payer: Self-pay | Admitting: Pharmacist

## 2012-06-01 ENCOUNTER — Other Ambulatory Visit: Payer: Self-pay | Admitting: Obstetrics & Gynecology

## 2012-06-05 ENCOUNTER — Encounter (HOSPITAL_COMMUNITY): Payer: Self-pay

## 2012-06-05 ENCOUNTER — Encounter (HOSPITAL_COMMUNITY)
Admission: RE | Admit: 2012-06-05 | Discharge: 2012-06-05 | Disposition: A | Discharge status: Home / Self Care | Payer: PRIVATE HEALTH INSURANCE | Source: Ambulatory Visit | Attending: Obstetrics & Gynecology | Admitting: Obstetrics & Gynecology

## 2012-06-05 LAB — SURGICAL PCR SCREEN
MRSA, PCR: NEGATIVE
Staphylococcus aureus: POSITIVE — AB

## 2012-06-05 LAB — CBC
Hemoglobin: 14 g/dL (ref 12.0–15.0)
RBC: 4.71 MIL/uL (ref 3.87–5.11)

## 2012-06-05 LAB — BASIC METABOLIC PANEL
GFR calc Af Amer: 78 mL/min — ABNORMAL LOW (ref >90)
GFR calc non Af Amer: 67 mL/min — ABNORMAL LOW (ref >90)
Glucose, Bld: 93 mg/dL (ref 70–99)
Potassium: 4 mEq/L (ref 3.5–5.1)
Sodium: 139 mEq/L (ref 135–145)

## 2012-06-05 LAB — PROTIME-INR: INR: 0.95 (ref 0.00–1.49)

## 2012-06-05 NOTE — Patient Instructions (Addendum)
   Your procedure is scheduled [id number] March 21st  Enter through the Main Entrance of [facility] at: 7:15am Pick up the phone at the desk and dial [phone] and inform us of your arrival.  Please call this number if you have any problems the morning of surgery: [phone]  Remember: Do not eat or drink anything after midnight on Thursday Please take your Amlodipine morning of surgery with sips of water  Do not wear jewelry, make-up, or FINGER nail polish No metal in your hair or on your body. Do not wear lotions, powders, perfumes. You may wear deodorant.  Please use your CHG wash as directed prior to surgery.  Do not shave anywhere for at least 12 hours prior to first CHG shower.  Do not bring valuables to the hospital. Contacts, dentures or bridgework may not be worn into surgery.  Leave suitcase in the car. After Surgery it may be brought to your room. For patients being admitted to the hospital, checkout time is 11:00am the day of discharge.  Patients discharged on the day of surgery will not be allowed to drive home.

## 2012-06-11 NOTE — H&P (Signed)
Sara [name] is an 67 y.o. female G5P5, active woman with well controlled HTN, GERD, who has symptomatic uterovaginal prolapse; stage 2 uterovaginal prolapse and stage 3 cystocele who is here for surgical repair.  Declined pessary and expectant management. She is using Estrace vaginal cream but is not on systemic HRT.  She denies urinary incontinence but has large post-void residual and recent recurrent UTIs due to that. She has slight occult stress incontinence when cystocele corrected manually. Office Urodynamics noted abdominal strain for voiding and large PVR.   She had Urology consult, PVR seems to be mainly from large cystocele and was advised not to have stress incontinence prophylactic surgery due to high risk for post-op retention. Uro office Cystoscopy was normal with bilateral ureteral efflux.   Menopausal, no abnormal vaginal bleeding, normal Paps, currently single and not sexually active. No breast complaints, gets regular mammograms, has FamHx of breast cancer (grandmother) ObH- 5 SVDs, large babies.    No LMP recorded. Patient is postmenopausal.    Past Medical History  Diagnosis Date  . HIATAL HERNIA     s/p surgical repair  . HYPERTENSION   GERD Depression Renal stones  Past Surgical History  Procedure Laterality Date  . Tonsillectomy  1968  . Cataract extraction  2009  . Hernia repair  2011  . Lithotripsy      Family History  Problem Relation Age of Onset  . Cancer Mother   . Hypertension Other     Parent  . Cancer Other     Breast, Grandparent  . Heart disease Other     parent, grandparent    Social History:  reports that she has never smoked. She does not have any smokeless tobacco history on file. She reports that she does not drink alcohol or use illicit drugs.  Allergies: No Known Allergies  Review of Systems  Constitutional: Negative for fever.  Eyes: Negative for blurred vision.  Respiratory: Negative for shortness of breath.    Cardiovascular: Negative for chest pain.  Gastrointestinal: Negative for heartburn.  Genitourinary: Negative for dysuria.  Skin: Negative for rash.  Neurological: Negative for dizziness and headaches.  Endo/Heme/Allergies: Does not bruise/bleed easily.    There were no vitals taken for this visit. Physical Exam A&O x 3, no acute distress. Pleasant HEENT neg, no masses Lungs CTA bilat CV RRR, S1S2 normal Abdo soft, non tender, non acute Extr no edema/ tenderness Pelvic- Uterine prolapse stage 2, cystocele stage 3. No rectocele. No pelvic/adnexal masses. Healthy vaginal walls with slight atrophic changes.  Assessment/Plan: 67 yo menopausal woman with symptomatic utero-vaginal prolapse, large cystocele. Here for total vaginal hysterectomy, possible bilateral salpingoophorectomy, anterior colporrhaphy. Pt decline synthetic mesh for any repair.  Due to high post-residual volumes, she will not have concomitant mid-urethral sling for slight occult stress incontinence.   Risks/complications of surgery reviewed incl infection, bleeding, damage to internal organs including bladder, bowels, ureters, blood vessels, other risks from anesthesia, VTE and delayed complications of any surgery, complications in future surgery reviewed. Also reviewed possible recurrence of prolapse and need for additional surgeries as well as urinary incontinence surgery in future. Risk of retention due to high PVR and need for indwelling catheter until voiding improved reviewed.  Patient understands and agrees.    Luane Rochon R 06/11/2012, 7:41 PM

## 2012-06-12 ENCOUNTER — Ambulatory Visit (HOSPITAL_COMMUNITY): Payer: PRIVATE HEALTH INSURANCE | Admitting: Anesthesiology

## 2012-06-12 ENCOUNTER — Encounter (HOSPITAL_COMMUNITY): Payer: Self-pay | Admitting: Anesthesiology

## 2012-06-12 ENCOUNTER — Encounter (HOSPITAL_COMMUNITY): Payer: Self-pay | Admitting: *Deleted

## 2012-06-12 ENCOUNTER — Encounter (HOSPITAL_COMMUNITY)
Admission: RE | Disposition: A | Discharge status: Home / Self Care | Payer: Self-pay | Source: Ambulatory Visit | Attending: Obstetrics & Gynecology

## 2012-06-12 ENCOUNTER — Observation Stay (HOSPITAL_COMMUNITY)
Admission: RE | Admit: 2012-06-12 | Discharge: 2012-06-13 | Disposition: A | Discharge status: Home / Self Care | Payer: PRIVATE HEALTH INSURANCE | Source: Ambulatory Visit | Attending: Obstetrics & Gynecology | Admitting: Obstetrics & Gynecology

## 2012-06-12 DIAGNOSIS — Z9189 Other specified personal risk factors, not elsewhere classified: Secondary | ICD-10-CM

## 2012-06-12 DIAGNOSIS — Z87442 Personal history of urinary calculi: Secondary | ICD-10-CM

## 2012-06-12 DIAGNOSIS — R55 Syncope and collapse: Secondary | ICD-10-CM

## 2012-06-12 DIAGNOSIS — F4321 Adjustment disorder with depressed mood: Secondary | ICD-10-CM

## 2012-06-12 DIAGNOSIS — K219 Gastro-esophageal reflux disease without esophagitis: Secondary | ICD-10-CM

## 2012-06-12 DIAGNOSIS — Z90710 Acquired absence of both cervix and uterus: Secondary | ICD-10-CM | POA: Diagnosis not present

## 2012-06-12 DIAGNOSIS — D250 Submucous leiomyoma of uterus: Secondary | ICD-10-CM | POA: Insufficient documentation

## 2012-06-12 DIAGNOSIS — K449 Diaphragmatic hernia without obstruction or gangrene: Secondary | ICD-10-CM

## 2012-06-12 DIAGNOSIS — N812 Incomplete uterovaginal prolapse: Principal | ICD-10-CM | POA: Insufficient documentation

## 2012-06-12 DIAGNOSIS — N841 Polyp of cervix uteri: Secondary | ICD-10-CM | POA: Insufficient documentation

## 2012-06-12 DIAGNOSIS — N72 Inflammatory disease of cervix uteri: Secondary | ICD-10-CM | POA: Insufficient documentation

## 2012-06-12 DIAGNOSIS — I10 Essential (primary) hypertension: Secondary | ICD-10-CM | POA: Insufficient documentation

## 2012-06-12 HISTORY — PX: CYSTOCELE REPAIR: SHX163

## 2012-06-12 HISTORY — PX: VAGINAL HYSTERECTOMY: SHX2639

## 2012-06-12 LAB — BASIC METABOLIC PANEL
BUN: 16 mg/dL (ref 6–23)
CO2: 23 mEq/L (ref 19–32)
Glucose, Bld: 126 mg/dL — ABNORMAL HIGH (ref 70–99)
Potassium: 4 mEq/L (ref 3.5–5.1)
Sodium: 135 mEq/L (ref 135–145)

## 2012-06-12 LAB — CBC
HCT: 39.4 % (ref 36.0–46.0)
Hemoglobin: 13.2 g/dL (ref 12.0–15.0)
RBC: 4.43 MIL/uL (ref 3.87–5.11)

## 2012-06-12 SURGERY — HYSTERECTOMY, VAGINAL
Anesthesia: General | Site: Vagina | Wound class: Clean Contaminated

## 2012-06-12 MED ORDER — LIDOCAINE HCL (CARDIAC) 20 MG/ML IV SOLN
INTRAVENOUS | Status: AC
  Filled 2012-06-12: qty 5

## 2012-06-12 MED ORDER — PROPOFOL INFUSION 10 MG/ML OPTIME: INTRAVENOUS | Status: DC | PRN

## 2012-06-12 MED ORDER — LACTATED RINGERS IV SOLN
INTRAVENOUS | Status: DC
  Administered 2012-06-12 (×3): via INTRAVENOUS

## 2012-06-12 MED ORDER — KETOROLAC TROMETHAMINE 30 MG/ML IJ SOLN: 30.0000 mg | Freq: Four times a day (QID) | INTRAMUSCULAR | Status: AC | PRN

## 2012-06-12 MED ORDER — DEXAMETHASONE SODIUM PHOSPHATE 10 MG/ML IJ SOLN
INTRAMUSCULAR | Status: AC
  Filled 2012-06-12: qty 1

## 2012-06-12 MED ORDER — IBUPROFEN 600 MG PO TABS: 600.0000 mg | ORAL_TABLET | Freq: Four times a day (QID) | ORAL | Status: DC | PRN

## 2012-06-12 MED ORDER — ESTRADIOL 0.1 MG/GM VA CREA
TOPICAL_CREAM | VAGINAL | Status: DC | PRN
  Administered 2012-06-12: 1 via VAGINAL

## 2012-06-12 MED ORDER — MORPHINE SULFATE (PF) 0.5 MG/ML IJ SOLN
INTRAMUSCULAR | Status: DC | PRN
  Administered 2012-06-12: .1 mg via INTRATHECAL

## 2012-06-12 MED ORDER — SCOPOLAMINE 1 MG/3DAYS TD PT72
MEDICATED_PATCH | TRANSDERMAL | Status: AC
Start: 2012-06-12 — End: 2012-06-13
  Filled 2012-06-12: qty 1

## 2012-06-12 MED ORDER — DEXAMETHASONE SODIUM PHOSPHATE 4 MG/ML IJ SOLN
INTRAMUSCULAR | Status: DC | PRN
  Administered 2012-06-12: 5 mg via INTRAVENOUS

## 2012-06-12 MED ORDER — SCOPOLAMINE 1 MG/3DAYS TD PT72
1.0000 | MEDICATED_PATCH | Freq: Once | TRANSDERMAL | Status: DC
  Administered 2012-06-12: 1.5 mg via TRANSDERMAL

## 2012-06-12 MED ORDER — ONDANSETRON HCL 4 MG/2ML IJ SOLN: 4.0000 mg | Freq: Three times a day (TID) | INTRAMUSCULAR | Status: DC | PRN

## 2012-06-12 MED ORDER — MIDAZOLAM HCL 2 MG/2ML IJ SOLN
INTRAMUSCULAR | Status: AC
  Filled 2012-06-12: qty 2

## 2012-06-12 MED ORDER — MIDAZOLAM HCL 5 MG/5ML IJ SOLN
INTRAMUSCULAR | Status: DC | PRN
  Administered 2012-06-12: 2 mg via INTRAVENOUS
  Administered 2012-06-12 (×2): 1 mg via INTRAVENOUS

## 2012-06-12 MED ORDER — SODIUM BICARBONATE 8.4 % IV SOLN
INTRAVENOUS | Status: AC
  Filled 2012-06-12: qty 50

## 2012-06-12 MED ORDER — ONDANSETRON HCL 4 MG/2ML IJ SOLN: 4.0000 mg | Freq: Four times a day (QID) | INTRAMUSCULAR | Status: DC | PRN

## 2012-06-12 MED ORDER — DEXTROSE 5 % IV SOLN
1.0000 ug/kg/h | INTRAVENOUS | Status: DC | PRN
  Filled 2012-06-12: qty 2

## 2012-06-12 MED ORDER — ONDANSETRON HCL 4 MG PO TABS: 4.0000 mg | ORAL_TABLET | Freq: Four times a day (QID) | ORAL | Status: DC | PRN

## 2012-06-12 MED ORDER — ACETAMINOPHEN 160 MG/5ML PO SOLN
975.0000 mg | Freq: Once | ORAL | Status: AC
  Administered 2012-06-12: 975 mg via ORAL
  Filled 2012-06-12: qty 40.6

## 2012-06-12 MED ORDER — NITROFURANTOIN MONOHYD MACRO 100 MG PO CAPS
100.0000 mg | ORAL_CAPSULE | Freq: Every day | ORAL | Status: DC
  Filled 2012-06-12 (×2): qty 1

## 2012-06-12 MED ORDER — KETOROLAC TROMETHAMINE 30 MG/ML IJ SOLN: 30.0000 mg | Freq: Once | INTRAMUSCULAR | Status: AC

## 2012-06-12 MED ORDER — BUPIVACAINE IN DEXTROSE 0.75-8.25 % IT SOLN
INTRATHECAL | Status: DC | PRN
  Administered 2012-06-12: 1.5 mL via INTRATHECAL

## 2012-06-12 MED ORDER — DIPHENHYDRAMINE HCL 50 MG/ML IJ SOLN: 25.0000 mg | INTRAMUSCULAR | Status: DC | PRN

## 2012-06-12 MED ORDER — CEFAZOLIN SODIUM-DEXTROSE 2-3 GM-% IV SOLR
INTRAVENOUS | Status: AC
  Filled 2012-06-12: qty 50

## 2012-06-12 MED ORDER — SODIUM CHLORIDE 0.9 % IV SOLN
0.0000 mL/h | INTRAVENOUS | Status: DC
  Administered 2012-06-12: 10 mL/h via EPIDURAL
  Filled 2012-06-12 (×6): qty 17

## 2012-06-12 MED ORDER — PROPOFOL 10 MG/ML IV EMUL
INTRAVENOUS | Status: AC
  Filled 2012-06-12: qty 20

## 2012-06-12 MED ORDER — PROPOFOL 10 MG/ML IV BOLUS
INTRAVENOUS | Status: DC | PRN
  Administered 2012-06-12 (×7): 10 ug via INTRAVENOUS
  Administered 2012-06-12 (×2): 20 ug via INTRAVENOUS
  Administered 2012-06-12: 10 ug via INTRAVENOUS
  Administered 2012-06-12: 20 ug via INTRAVENOUS
  Administered 2012-06-12 (×2): 10 ug via INTRAVENOUS
  Administered 2012-06-12 (×2): 20 ug via INTRAVENOUS

## 2012-06-12 MED ORDER — NALBUPHINE HCL 10 MG/ML IJ SOLN
5.0000 mg | INTRAMUSCULAR | Status: DC | PRN
  Filled 2012-06-12: qty 1

## 2012-06-12 MED ORDER — MORPHINE SULFATE 0.5 MG/ML IJ SOLN
INTRAMUSCULAR | Status: AC
  Filled 2012-06-12: qty 10

## 2012-06-12 MED ORDER — GLYCOPYRROLATE 0.2 MG/ML IJ SOLN
INTRAMUSCULAR | Status: AC
  Filled 2012-06-12: qty 2

## 2012-06-12 MED ORDER — KETOROLAC TROMETHAMINE 30 MG/ML IJ SOLN
INTRAMUSCULAR | Status: AC
  Filled 2012-06-12: qty 1

## 2012-06-12 MED ORDER — FENTANYL CITRATE 0.05 MG/ML IJ SOLN
INTRAMUSCULAR | Status: DC | PRN
  Administered 2012-06-12 (×2): 50 ug via INTRAVENOUS
  Administered 2012-06-12: 15 ug via INTRATHECAL

## 2012-06-12 MED ORDER — CEFAZOLIN SODIUM-DEXTROSE 2-3 GM-% IV SOLR
2.0000 g | INTRAVENOUS | Status: AC
  Administered 2012-06-12: 2 g via INTRAVENOUS

## 2012-06-12 MED ORDER — VASOPRESSIN 20 UNIT/ML IJ SOLN
INTRAMUSCULAR | Status: AC
  Filled 2012-06-12: qty 1

## 2012-06-12 MED ORDER — MENTHOL 3 MG MT LOZG: 1.0000 | LOZENGE | OROMUCOSAL | Status: DC | PRN

## 2012-06-12 MED ORDER — SODIUM CHLORIDE 0.9 % IV SOLN
INTRAVENOUS | Status: DC
  Administered 2012-06-12: 10 mL/h via EPIDURAL
  Filled 2012-06-12 (×8): qty 20

## 2012-06-12 MED ORDER — NEOSTIGMINE METHYLSULFATE 1 MG/ML IJ SOLN
INTRAMUSCULAR | Status: AC
  Filled 2012-06-12: qty 1

## 2012-06-12 MED ORDER — FENTANYL CITRATE 0.05 MG/ML IJ SOLN
INTRAMUSCULAR | Status: AC
  Filled 2012-06-12: qty 5

## 2012-06-12 MED ORDER — DIPHENHYDRAMINE HCL 25 MG PO CAPS: 25.0000 mg | ORAL_CAPSULE | ORAL | Status: DC | PRN

## 2012-06-12 MED ORDER — KETOROLAC TROMETHAMINE 30 MG/ML IJ SOLN
INTRAMUSCULAR | Status: AC
  Administered 2012-06-12: 30 mg via INTRAVENOUS
  Filled 2012-06-12: qty 1

## 2012-06-12 MED ORDER — LIDOCAINE-EPINEPHRINE (PF) 2 %-1:200000 IJ SOLN
INTRAMUSCULAR | Status: AC
  Filled 2012-06-12: qty 20

## 2012-06-12 MED ORDER — SODIUM BICARBONATE 8.4 % IV SOLN
INTRAVENOUS | Status: DC | PRN
  Administered 2012-06-12: 3 mL via EPIDURAL

## 2012-06-12 MED ORDER — DIPHENHYDRAMINE HCL 50 MG/ML IJ SOLN: 12.5000 mg | INTRAMUSCULAR | Status: DC | PRN

## 2012-06-12 MED ORDER — ONDANSETRON HCL 4 MG/2ML IJ SOLN
INTRAMUSCULAR | Status: AC
  Filled 2012-06-12: qty 2

## 2012-06-12 MED ORDER — NALOXONE HCL 0.4 MG/ML IJ SOLN: 0.4000 mg | INTRAMUSCULAR | Status: DC | PRN

## 2012-06-12 MED ORDER — ESTRADIOL 0.1 MG/GM VA CREA
TOPICAL_CREAM | VAGINAL | Status: AC
  Filled 2012-06-12: qty 42.5

## 2012-06-12 MED ORDER — BUPIVACAINE HCL (PF) 0.25 % IJ SOLN
INTRAMUSCULAR | Status: AC
  Filled 2012-06-12: qty 30

## 2012-06-12 MED ORDER — SODIUM CHLORIDE 0.9 % IJ SOLN: 3.0000 mL | INTRAMUSCULAR | Status: DC | PRN

## 2012-06-12 MED ORDER — DEXTROSE IN LACTATED RINGERS 5 % IV SOLN
INTRAVENOUS | Status: DC
  Administered 2012-06-12 – 2012-06-13 (×2): via INTRAVENOUS

## 2012-06-12 MED ORDER — MEPERIDINE HCL 25 MG/ML IJ SOLN: 6.2500 mg | INTRAMUSCULAR | Status: DC | PRN

## 2012-06-12 MED ORDER — METOCLOPRAMIDE HCL 5 MG/ML IJ SOLN: 10.0000 mg | Freq: Three times a day (TID) | INTRAMUSCULAR | Status: DC | PRN

## 2012-06-12 MED ORDER — KETOROLAC TROMETHAMINE 30 MG/ML IJ SOLN
15.0000 mg | Freq: Once | INTRAMUSCULAR | Status: AC | PRN
  Administered 2012-06-12: 30 mg via INTRAVENOUS

## 2012-06-12 MED ORDER — ROCURONIUM BROMIDE 50 MG/5ML IV SOLN
INTRAVENOUS | Status: AC
  Filled 2012-06-12: qty 1

## 2012-06-12 MED ORDER — ACETAMINOPHEN 10 MG/ML IV SOLN
INTRAVENOUS | Status: AC
  Filled 2012-06-12: qty 100

## 2012-06-12 MED ORDER — AMLODIPINE BESYLATE 5 MG PO TABS
5.0000 mg | ORAL_TABLET | Freq: Every day | ORAL | Status: DC
  Filled 2012-06-12 (×2): qty 1

## 2012-06-12 MED ORDER — VASOPRESSIN 20 UNIT/ML IJ SOLN
INTRAMUSCULAR | Status: DC | PRN
  Administered 2012-06-12: 20 [IU]

## 2012-06-12 MED ORDER — 0.9 % SODIUM CHLORIDE (POUR BTL) OPTIME
TOPICAL | Status: DC | PRN
  Administered 2012-06-12: 1000 mL

## 2012-06-12 MED ORDER — ONDANSETRON HCL 4 MG/2ML IJ SOLN
INTRAMUSCULAR | Status: DC | PRN
  Administered 2012-06-12: 4 mg via INTRAVENOUS

## 2012-06-12 SURGICAL SUPPLY — 44 items
BLADE SURG 15 STRL LF C SS BP (BLADE) ×2 IMPLANT
BLADE SURG 15 STRL SS (BLADE) ×1
CANISTER SUCTION 2500CC (MISCELLANEOUS) ×3 IMPLANT
CLOTH BEACON ORANGE TIMEOUT ST (SAFETY) ×3 IMPLANT
CONT PATH 16OZ SNAP LID 3702 (MISCELLANEOUS) ×3 IMPLANT
CONTAINER PREFILL 10% NBF 60ML (FORM) IMPLANT
DECANTER SPIKE VIAL GLASS SM (MISCELLANEOUS) ×6 IMPLANT
DISSECTOR SPONGE CHERRY (GAUZE/BANDAGES/DRESSINGS) ×3 IMPLANT
DRAPE HYSTEROSCOPY (DRAPE) ×3 IMPLANT
DRAPE STERI URO 9X17 APER PCH (DRAPES) ×3 IMPLANT
DRESSING TELFA 8X3 (GAUZE/BANDAGES/DRESSINGS) IMPLANT
ELECT LIGASURE LONG (ELECTRODE) IMPLANT
ELECT LIGASURE SHORT 9 REUSE (ELECTRODE) ×3 IMPLANT
GAUZE PACKING 1 X5 YD ST (GAUZE/BANDAGES/DRESSINGS) ×3 IMPLANT
GLOVE BIO SURGEON STRL SZ7 (GLOVE) ×3 IMPLANT
GLOVE BIOGEL PI IND STRL 7.0 (GLOVE) ×2 IMPLANT
GLOVE BIOGEL PI INDICATOR 7.0 (GLOVE) ×1
GOWN PREVENTION PLUS LG XLONG (DISPOSABLE) ×12 IMPLANT
GOWN STRL REIN XL XLG (GOWN DISPOSABLE) ×9 IMPLANT
NEEDLE HYPO 22GX1.5 SAFETY (NEEDLE) ×3 IMPLANT
NEEDLE MAYO .5 CIRCLE (NEEDLE) ×3 IMPLANT
NEEDLE SPNL 22GX3.5 QUINCKE BK (NEEDLE) ×3 IMPLANT
NS IRRIG 1000ML POUR BTL (IV SOLUTION) ×3 IMPLANT
PACK VAGINAL WOMENS (CUSTOM PROCEDURE TRAY) ×3 IMPLANT
PAD OB MATERNITY 4.3X12.25 (PERSONAL CARE ITEMS) ×3 IMPLANT
RETRACTOR STAY HOOK 5MM (MISCELLANEOUS) IMPLANT
SUT CHROMIC 0 CT 1 (SUTURE) IMPLANT
SUT CHROMIC 2 0 SH (SUTURE) ×9 IMPLANT
SUT VIC AB 0 CT1 18XCR BRD8 (SUTURE) ×2 IMPLANT
SUT VIC AB 0 CT1 27 (SUTURE)
SUT VIC AB 0 CT1 27XBRD ANBCTR (SUTURE) IMPLANT
SUT VIC AB 0 CT1 8-18 (SUTURE) ×2
SUT VIC AB 2-0 CT1 (SUTURE) ×3 IMPLANT
SUT VIC AB 2-0 SH 27 (SUTURE) ×4
SUT VIC AB 2-0 SH 27XBRD (SUTURE) ×4 IMPLANT
SUT VIC AB 3-0 CT1 27 (SUTURE) ×1
SUT VIC AB 3-0 CT1 TAPERPNT 27 (SUTURE) ×2 IMPLANT
SUT VIC AB 3-0 SH 27 (SUTURE)
SUT VIC AB 3-0 SH 27X BRD (SUTURE) IMPLANT
SUT VICRYL 0 TIES 12 18 (SUTURE) ×3 IMPLANT
TOWEL OR 17X24 6PK STRL BLUE (TOWEL DISPOSABLE) ×6 IMPLANT
TRAY FOLEY CATH 14FR (SET/KITS/TRAYS/PACK) ×3 IMPLANT
TUBING CONNECTING 10 (TUBING) ×3 IMPLANT
WATER STERILE IRR 1000ML POUR (IV SOLUTION) IMPLANT

## 2012-06-12 NOTE — Anesthesia Postprocedure Evaluation (Signed)
  Anesthesia Post-op Note  Anesthesia Post Note  Patient: Sara [name]  Procedure(s) Performed: Procedure(s) (LRB): HYSTERECTOMY VAGINAL (N/A) ANTERIOR REPAIR (CYSTOCELE) (N/A)  Anesthesia type: Combined Spinal/Epidural  Patient location: PACU  Post pain: Pain level controlled  Post assessment: Post-op Vital signs reviewed  Last Vitals:  Filed Vitals:   06/12/12 1325  BP:   Pulse: 72  Temp: 36.3 C  Resp: 18    Post vital signs: stable  Level of consciousness: awake  Complications: No apparent anesthesia complications

## 2012-06-12 NOTE — Op Note (Signed)
06/12/2012  12:59 PM  PATIENT:  Sara [name]  67 y.o. female  PRE-OPERATIVE DIAGNOSIS:  Uterovaginal Prolapse, Cystocele  58260, [id number]  POST-OPERATIVE DIAGNOSIS:  Uterovaginal Prolapse, Cystocele    PROCEDURE:  TOTAL VAGINAL HYSTERECTOMY and ANTERIOR REPAIR (CYSTOCELE)  SURGEON: Robley Fries, MD      ASSISTANTS:  Genia Del, MD  ANESTHESIA:  Epidural   I/O:  In: 2000 LR, Out: 625 [Urine:475; Blood:150]  SPECIMEN:  Uterus and cervix  DISPOSITION OF SPECIMEN:  PATHOLOGY  COUNTS: Correct x 2  PATIENT DISPOSITION:  PACU - hemodynamically stable.  PLAN OF CARE: Admit for overnight observation  Procedure:  Patient brought to the Operating Room with informed written consent, with IV in place, She underwent Epidural anesthesia without complications. 2 gm Ancef given. SCDs placed, She was given dorsal lithotomy with gentle Trendelenburg position, prepped and draped in standard sterile fashion, Foley inserted, clear urine noted.  Weighted speculum placed. Cervix grasped with 2 tenaculums. Cervico-vaginal junction infiltrated with dilute Pitressin (20 units in 100 cc saline). A circumferential incision made with Bovie and vaginal walls dissected away from endocervical fascia. Posteriorly, dissection performed to find the area over cul de sac and grasped with Allis clamp, incised with Mayo scissors to open the cul de sac. Entry was confirmed and Deaver retractor placed. Bilateral uterosacral ligaments were clamped with Heaney and cut and transfixed with 0-Vicryl and ends were left long. Anterior dissection was continued and bladder reflexion was assessed by digital exam through cul de sac opening, was noted to be low due to large cystocele. Gentle blunt and sharp dissection performed to identify anterior peritoneal reflexion, was grasped and cut and peritoneal entry confirmed. Running stitch on posterior vaginal incision taken to control bleeding and then a weighted speculum placed in  posterior cul de sac and Deaver in anterior cul de sac. Using Ligasure device bilateral paracervical and parametrial tissue was clamped, desiccated and cut followed by uterine vessels. Round ligaments and cornual pedicles also clamped, desiccated and cut in steps. Hemostasis was excellent. Ovaries were palpated, were small with very short IP ligaments hence they were not removed. All pedicles were dry. Peritoneal edges grasped and peritoneal closure done with 2-0 Vicryl. Uterosacral sutured were tied to each other in the midline.  Anterior Colporrhaphy:  Anterior vaginal wall of Cystocele grasped and dilute Pitressin infiltrated to aid dissection. Allis clamps applied on either sides of the midline and vaginal walls undermined and dissected to cover the entire length of Cystocele (upto 2 cm below the urethral meatus). Vaginal walls dissected with Strully scissors until torn edges of pubovaginal fascia seen well and dissection continues laterally. Once vaginal dissection completed, the Cystocele was reduced with interrupted stitches of 0 Vicryl to approximate pubovaginal fascia in the midline incorporating fascia as well as utero-sacral ligaments as the closure was completed. Redundant vaginal walls excised and cut edges approximated with 0-Vicryl in continues vertical closure. At the end Cystocele was well reduced and vaginal vault was well suspended.   Estrace cream covered vaginal packing placed. Foley was left in placed, Urine noted to be clear.  Procedure was complete, excellent hemostasis, all counts correct x 2. Patient brought to PACU in stable condition and plan to use Epidural pain management.   I performed the entire surgery.  V.Umer Harig, MD

## 2012-06-12 NOTE — Anesthesia Preprocedure Evaluation (Addendum)
Anesthesia Evaluation  Patient identified by MRN, date of birth, ID band Patient awake    Reviewed: Allergy & Precautions, H&P , NPO status , Patient's Chart, lab work & pertinent test results, reviewed documented beta blocker date and time   History of Anesthesia Complications Negative for: history of anesthetic complications  Airway Mallampati: I TM Distance: >3 FB Neck ROM: full    Dental  (+) Teeth Intact   Pulmonary neg pulmonary ROS,  breath sounds clear to auscultation  Pulmonary exam normal       Cardiovascular hypertension, On Medications Rhythm:regular Rate:Normal     Neuro/Psych negative neurological ROS  negative psych ROS   GI/Hepatic negative GI ROS, Neg liver ROS,   Endo/Other  negative endocrine ROS  Renal/GU negative Renal ROS  Female GU complaint     Musculoskeletal   Abdominal   Peds  Hematology negative hematology ROS (+)   Anesthesia Other Findings   Reproductive/Obstetrics negative OB ROS                           Anesthesia Physical Anesthesia Plan  ASA: II  Anesthesia Plan: Combined Spinal and Epidural and MAC   Post-op Pain Management:    Induction:   Airway Management Planned:   Additional Equipment:   Intra-op Plan:   Post-operative Plan:   Informed Consent: I have reviewed the patients History and Physical, chart, labs and discussed the procedure including the risks, benefits and alternatives for the proposed anesthesia with the patient or authorized representative who has indicated his/her understanding and acceptance.   Dental Advisory Given  Plan Discussed with: CRNA and Surgeon  Anesthesia Plan Comments:        Anesthesia Quick Evaluation

## 2012-06-12 NOTE — Brief Op Note (Signed)
06/12/2012  11:48 AM  PATIENT:  Sara [name]  67 y.o. female  PRE-OPERATIVE DIAGNOSIS:  Uterovaginal Prolapse, Cystocele  58260, [id number]  POST-OPERATIVE DIAGNOSIS:  Uterovaginal Prolapse, Cystocele    PROCEDURE:  Procedure(s): HYSTERECTOMY VAGINAL (N/A) ANTERIOR REPAIR (CYSTOCELE) (N/A)  SURGEON: Robley Fries, MD      ASSISTANTS:  Genia Del, MD  ANESTHESIA:  Epidural   I/O:   Total I/O In: 1800 [I.V.:1800] Out: 625 [Urine:475; Blood:150]  SPECIMEN:  Uterus and cervix  DISPOSITION OF SPECIMEN:  PATHOLOGY  COUNTS:  YES  DICTATION: .Note written in EPIC  PLAN OF CARE: Admit for overnight observation  PATIENT DISPOSITION:  PACU - hemodynamically stable.   Delay start of Pharmacological VTE agent (>24hrs) due to surgical blood loss or risk of bleeding: not applicable

## 2012-06-12 NOTE — Transfer of Care (Signed)
Immediate Anesthesia Transfer of Care Note  Patient: Sara [name]  Procedure(s) Performed: Procedure(s): HYSTERECTOMY VAGINAL (N/A) ANTERIOR REPAIR (CYSTOCELE) (N/A)  Patient Location: PACU  Anesthesia Type:Spinal  Level of Consciousness: awake, alert  and oriented  Airway & Oxygen Therapy: Patient Spontanous Breathing  Post-op Assessment: Report given to PACU RN and Post -op Vital signs reviewed and stable  Post vital signs: Reviewed and stable  Complications: No apparent anesthesia complications

## 2012-06-12 NOTE — Anesthesia Procedure Notes (Addendum)
Spinal  Patient location during procedure: OR Start time: 06/12/2012 9:02 AM Staffing Performed by: anesthesiologist  Preanesthetic Checklist Completed: patient identified, site marked, surgical consent, pre-op evaluation, timeout performed, IV checked, risks and benefits discussed and monitors and equipment checked Spinal Block Patient position: sitting Prep: site prepped and draped and DuraPrep Patient monitoring: heart rate, cardiac monitor, continuous pulse ox and blood pressure Approach: midline Location: L3-4 Injection technique: single-shot Needle Needle type: Tuohy and Pencan  Needle gauge: 24 G Needle length: 9 cm Needle insertion depth: 5 cm Catheter type: closed end flexible Catheter size: 19 g Catheter at skin depth: 10 cm Assessment Sensory level: T4 Additional Notes LOR to air at 5 cm, 25 ga Pencan via tuohy with immediate return of clear, free flow CSF.  No paresthesia.  SAB dose given.  Pencan withdrawn, 2 ml saline to epidural space, epidural catheter threaded easily and secured at 10 cm at skin.  Epidural catheter NOT TESTED.  Patient tolerated procedure well with no apparent complications.  Jasmine December, MD

## 2012-06-13 DIAGNOSIS — Z90710 Acquired absence of both cervix and uterus: Secondary | ICD-10-CM | POA: Diagnosis not present

## 2012-06-13 LAB — CBC
HCT: 34.4 % — ABNORMAL LOW (ref 36.0–46.0)
Hemoglobin: 11.5 g/dL — ABNORMAL LOW (ref 12.0–15.0)
MCH: 29.8 pg (ref 26.0–34.0)
MCV: 89.1 fL (ref 78.0–100.0)
RBC: 3.86 MIL/uL — ABNORMAL LOW (ref 3.87–5.11)

## 2012-06-13 LAB — BASIC METABOLIC PANEL
BUN: 16 mg/dL (ref 6–23)
CO2: 25 mEq/L (ref 19–32)
Calcium: 8.9 mg/dL (ref 8.4–10.5)
Creatinine, Ser: 0.79 mg/dL (ref 0.50–1.10)
Glucose, Bld: 117 mg/dL — ABNORMAL HIGH (ref 70–99)

## 2012-06-13 MED ORDER — OXYCODONE-ACETAMINOPHEN 5-325 MG PO TABS: 1.0000 | ORAL_TABLET | Freq: Four times a day (QID) | ORAL | Status: DC | PRN

## 2012-06-13 MED ORDER — ONDANSETRON HCL 4 MG PO TABS: 4.0000 mg | ORAL_TABLET | Freq: Three times a day (TID) | ORAL | Status: DC | PRN

## 2012-06-13 NOTE — Progress Notes (Signed)
Patient ID: Sara [name], female   DOB: 06-Jun-1945, 67 y.o.   MRN: [id number] Subjective: Did well, tolerated gen diet and no nausea/vomiting. Pain well controlled, with epidural yesterday, turned off this am. Ambulated, awaiting 1st void since foley out at 7 am. Denies bleeding, vag packing out, minimal staining.   Objective: Vital signs in last 24 hours: Temp:  [97.3 F (36.3 C)-99 F (37.2 C)] 98.2 F (36.8 C) (03/22 1000) Pulse Rate:  [62-93] 62 (03/22 0547) Resp:  [11-18] 18 (03/22 1000) BP: (94-144)/(44-80) 101/65 mmHg (03/22 1000) SpO2:  [82 %-100 %] 96 % (03/22 1000) Weight:  [202 lb (91.627 kg)] 202 lb (91.627 kg) (03/21 1439) Weight change:  Last BM Date: 06/11/12  Intake/Output from previous day: 03/21 0701 - 03/22 0700 In: 3740 [P.O.:240; I.V.:3500] Out: 1595 [Urine:1445; Blood:150] Intake/Output this shift: Total I/O In: 360 [P.O.:360] Out: -   A&O x 3, no acute distress. Pleasant HEENT neg Lungs CTA bilat CV RRR, S1S2 normal Abdo soft, non tender, non acute Extr no edema/ tenderness Pelvic deferred but no active bleeding  Lab Results:  Recent Labs  06/12/12 1609 06/13/12 0532  WBC 15.4* 13.1*  HGB 13.2 11.5*  HCT 39.4 34.4*  PLT 233 212  BMET  Recent Labs  06/12/12 1609 06/13/12 0532  NA 135 137  K 4.0 4.0  CL 102 104  CO2 23 25  GLUCOSE 126* 117*  BUN 16 16  CREATININE 0.77 0.79  CALCIUM 8.9 8.9    Assessment/Plan: POD 1, s/p TVH/ A-repair. Needs to void, check PVR (since large PVR pre-op, otherwise stable for discharge.  Post op care reviewed, f/up 2 wks in office.   LOS: 1 day   Marin Milley R 06/13/2012, 10:59 AM

## 2012-06-13 NOTE — Progress Notes (Signed)
Vaginal packing d/c per Dr. Juliene Pina... Scant amt of brown drainage noted throughout.. Pt tolerated well.Marland KitchenMarland Kitchen

## 2012-06-13 NOTE — Discharge Summary (Signed)
Physician Discharge Summary  Patient ID: Sara [name] MRN: [id number] DOB/AGE: 1946-02-02 67 y.o.  Admit date: 06/12/2012 Discharge date: 06/13/2012  Admission Diagnoses:  Uterovaginal prolapse stage II and cystocele stage III  Discharge Diagnoses:  S/p Total vaginal hysterectomy Anterior Colporrhaphy on 06/12/2012  Discharged Condition: Stable  Hospital Course:  Patient has uncomplicated  Total vaginal hysterectomy Anterior Colporrhaphy on 06/12/2012. Vital remained stable, BP med was held due to low normal pressures. Epidural was continued for post-op pain management. She advanced diet without any post-op nausea/vomiting. Vaginal packing and foley catheter removed on post-op day 1. She voided with large post-void residuals (about 240-250 cc) but that was not different from pre-op values hence discharged home. Patient ambulated well and pain very well controlled.   Discharge Exam: Blood pressure 101/65, pulse 62, temperature 98.2 F (36.8 C), temperature source Oral, resp. rate 18, height 5\' 8"  (1.727 m), weight 202 lb (91.627 kg), SpO2 96.00%.  Disposition:  Home. Post-op care and reportable symptoms reviewed. Follow up in office in 2 weeks.    Discharge Orders   Future Orders Complete By Expires     (HEART FAILURE PATIENTS) Call MD:  Anytime you have any of the following symptoms: 1) 3 pound weight gain in 24 hours or 5 pounds in 1 week 2) shortness of breath, with or without a dry hacking cough 3) swelling in the hands, feet or stomach 4) if you have to sleep on extra pillows at night in order to breathe.  As directed     Call MD for:  difficulty breathing, headache or visual disturbances  As directed     Call MD for:  extreme fatigue  As directed     Call MD for:  hives  As directed     Call MD for:  persistant dizziness or light-headedness  As directed     Call MD for:  persistant nausea and vomiting  As directed     Call MD for:  redness, tenderness, or signs of infection  (pain, swelling, redness, odor or green/yellow discharge around incision site)  As directed     Call MD for:  severe uncontrolled pain  As directed     Call MD for:  temperature >100.4  As directed     Diet - low sodium heart healthy  As directed     Driving Restrictions  As directed     Comments:      2 wks    Increase activity slowly  As directed     Lifting restrictions  As directed     Comments:      6 wks    Sexual Activity Restrictions  As directed     Comments:      6 wks        Medication List    TAKE these medications       amLODipine 5 MG tablet  Commonly known as:  NORVASC  Take 5 mg by mouth daily.     Cholecalciferol 1000 UNITS tablet  Take 3,000 Units by mouth daily.     estradiol 0.1 MG/GM vaginal cream  Commonly known as:  ESTRACE  Place 0.5 g vaginally 2 (two) times a week.     multivitamin with minerals Tabs  Take 1 tablet by mouth daily.     nitrofurantoin (macrocrystal-monohydrate) 100 MG capsule  Commonly known as:  MACROBID  Take 100 mg by mouth daily at 6 PM.     ondansetron 4 MG tablet  Commonly known  as:  ZOFRAN  Take 1 tablet (4 mg total) by mouth every 8 (eight) hours as needed for nausea.     oxyCODONE-acetaminophen 5-325 MG per tablet  Commonly known as:  ROXICET  Take 1 tablet by mouth every 6 (six) hours as needed for pain.           Follow-up Information   Follow up with Zaynab Chipman R, MD. Schedule an appointment as soon as possible for a visit in 2 weeks.   Contact information:   [address] [place] [address] [phone]       Signed: Robley Fries 06/13/2012, 11:07 AM

## 2012-06-13 NOTE — Progress Notes (Signed)
Discharge instructions reviewed with patient.  Patient states understanding of home care, signs/symptoms to report to MD or seek help for, medications, activity and return MD office visit.  No home equipment needed.  Patient ambulated in stable condition with staff for discharge without incident.

## 2012-06-13 NOTE — Progress Notes (Signed)
Erasmo Downer, RN at bedside to pull epidural catheter per Dr. Juliene Pina & Dr. Malen Gauze... Pt tolerated well.Marland Kitchen

## 2012-06-15 ENCOUNTER — Encounter (HOSPITAL_COMMUNITY): Payer: Self-pay | Admitting: Obstetrics & Gynecology

## 2012-06-26 ENCOUNTER — Encounter (HOSPITAL_COMMUNITY): Payer: Self-pay | Admitting: *Deleted

## 2012-06-26 ENCOUNTER — Emergency Department (HOSPITAL_COMMUNITY): Payer: PRIVATE HEALTH INSURANCE

## 2012-06-26 ENCOUNTER — Emergency Department (HOSPITAL_COMMUNITY)
Admission: EM | Admit: 2012-06-26 | Discharge: 2012-06-27 | Disposition: A | Discharge status: Home / Self Care | Payer: PRIVATE HEALTH INSURANCE | Attending: Emergency Medicine | Admitting: Emergency Medicine

## 2012-06-26 DIAGNOSIS — Z79899 Other long term (current) drug therapy: Secondary | ICD-10-CM | POA: Insufficient documentation

## 2012-06-26 DIAGNOSIS — R112 Nausea with vomiting, unspecified: Secondary | ICD-10-CM | POA: Insufficient documentation

## 2012-06-26 DIAGNOSIS — I10 Essential (primary) hypertension: Secondary | ICD-10-CM | POA: Insufficient documentation

## 2012-06-26 DIAGNOSIS — Z8719 Personal history of other diseases of the digestive system: Secondary | ICD-10-CM | POA: Insufficient documentation

## 2012-06-26 DIAGNOSIS — R109 Unspecified abdominal pain: Secondary | ICD-10-CM | POA: Insufficient documentation

## 2012-06-26 DIAGNOSIS — M549 Dorsalgia, unspecified: Secondary | ICD-10-CM | POA: Insufficient documentation

## 2012-06-26 LAB — URINALYSIS, MICROSCOPIC ONLY
Protein, ur: NEGATIVE mg/dL
Urobilinogen, UA: 0.2 mg/dL (ref 0.0–1.0)

## 2012-06-26 LAB — LIPASE, BLOOD: Lipase: 14 U/L (ref 11–59)

## 2012-06-26 LAB — CBC WITH DIFFERENTIAL/PLATELET
Eosinophils Relative: 0 % (ref 0–5)
HCT: 41.8 % (ref 36.0–46.0)
Hemoglobin: 14.8 g/dL (ref 12.0–15.0)
Lymphocytes Relative: 6 % — ABNORMAL LOW (ref 12–46)
Lymphs Abs: 1 10*3/uL (ref 0.7–4.0)
MCH: 30.5 pg (ref 26.0–34.0)
MCV: 86 fL (ref 78.0–100.0)
Monocytes Absolute: 0.7 10*3/uL (ref 0.1–1.0)
Monocytes Relative: 4 % (ref 3–12)
RBC: 4.86 MIL/uL (ref 3.87–5.11)
WBC: 17.2 10*3/uL — ABNORMAL HIGH (ref 4.0–10.5)

## 2012-06-26 LAB — COMPREHENSIVE METABOLIC PANEL
ALT: 11 U/L (ref 0–35)
BUN: 15 mg/dL (ref 6–23)
CO2: 27 mEq/L (ref 19–32)
Calcium: 9.7 mg/dL (ref 8.4–10.5)
Creatinine, Ser: 0.83 mg/dL (ref 0.50–1.10)
GFR calc Af Amer: 85 mL/min — ABNORMAL LOW (ref >90)
GFR calc non Af Amer: 73 mL/min — ABNORMAL LOW (ref >90)
Glucose, Bld: 136 mg/dL — ABNORMAL HIGH (ref 70–99)
Sodium: 140 mEq/L (ref 135–145)

## 2012-06-26 LAB — POCT I-STAT TROPONIN I: Troponin i, poc: 0 ng/mL (ref 0.00–0.08)

## 2012-06-26 MED ORDER — ONDANSETRON HCL 4 MG/2ML IJ SOLN
4.0000 mg | Freq: Once | INTRAMUSCULAR | Status: AC
  Administered 2012-06-26: 4 mg via INTRAVENOUS
  Filled 2012-06-26: qty 2

## 2012-06-26 MED ORDER — SODIUM CHLORIDE 0.9 % IV BOLUS (SEPSIS)
1000.0000 mL | Freq: Once | INTRAVENOUS | Status: AC
  Administered 2012-06-26: 1000 mL via INTRAVENOUS

## 2012-06-26 MED ORDER — HYDROMORPHONE HCL PF 1 MG/ML IJ SOLN
0.5000 mg | Freq: Once | INTRAMUSCULAR | Status: AC
Start: 2012-06-26 — End: 2012-06-26
  Administered 2012-06-26: 0.5 mg via INTRAVENOUS
  Filled 2012-06-26: qty 1

## 2012-06-26 NOTE — ED Provider Notes (Addendum)
History     CSN: [id number]  Arrival date & time 06/26/12  1815   First MD Initiated Contact with Patient 06/26/12 2018      Chief Complaint  Patient presents with  . Abdominal Pain  . Back Pain    HPI  Patient presents with left upper quadrant and epigastric pain.  Symptoms began earlier today after eating.  Initially there is associated nausea, vomiting.  Subsequent tabs to use antiemetics, analgesics, resulted in additional vomiting.  The pain is nonradiating, sharp.  The pain is diminished since onset. There is no associated superior chest pain, dyspnea, no fever, no chills. The patient has a notable recent history of hysterectomy, cystoplexi. The hysterectomy was done secondary to prolapsed bladder. She notes that since the procedure she is doing generally well until today.   Past Medical History  Diagnosis Date  . HIATAL HERNIA     s/p surgical repair  . HYPERTENSION     Past Surgical History  Procedure Laterality Date  . Tonsillectomy  1968  . Cataract extraction  2009  . Hernia repair  2011  . Lithotripsy    . Vaginal hysterectomy N/A 06/12/2012    Procedure: HYSTERECTOMY VAGINAL;  Surgeon: Robley Fries, MD;  Location: [facility];  Service: Gynecology;  Laterality: N/A;  . Cystocele repair N/A 06/12/2012    Procedure: ANTERIOR REPAIR (CYSTOCELE);  Surgeon: Robley Fries, MD;  Location: [facility];  Service: Gynecology;  Laterality: N/A;    Family History  Problem Relation Age of Onset  . Cancer Mother   . Hypertension Other     Parent  . Cancer Other     Breast, Grandparent  . Heart disease Other     parent, grandparent    History  Substance Use Topics  . Smoking status: Never Smoker   . Smokeless tobacco: Never Used     Comment: widowed since late 2011 (spouse had mod-adv dementia) 5 grown children, 3 nearby-,many g-kids  . Alcohol Use: No    OB History   Grav Para Term Preterm Abortions TAB SAB Ect Mult Living                  Review of Systems   Constitutional:       Per HPI, otherwise negative  HENT:       Per HPI, otherwise negative  Respiratory:       Per HPI, otherwise negative  Cardiovascular:       Per HPI, otherwise negative  Gastrointestinal:       History of present illness  Endocrine:       Negative aside from HPI  Genitourinary:       Neg aside from HPI   Musculoskeletal:       Per HPI, otherwise negative  Skin: Negative.   Neurological: Negative for syncope.    Allergies  Review of patient's allergies indicates no known allergies.  Home Medications   Current Outpatient Rx  Name  Route  Sig  Dispense  Refill  . amLODipine (NORVASC) 5 MG tablet   Oral   Take 5 mg by mouth daily.         . Cholecalciferol 1000 UNITS tablet   Oral   Take 3,000 Units by mouth daily.         Marland Kitchen estradiol (ESTRACE) 0.1 MG/GM vaginal cream   Vaginal   Place 0.5 g vaginally 2 (two) times a week.         . Multiple Vitamin (MULTIVITAMIN  WITH MINERALS) TABS   Oral   Take 1 tablet by mouth daily.         . nitrofurantoin, macrocrystal-monohydrate, (MACROBID) 100 MG capsule   Oral   Take 100 mg by mouth daily at 6 PM.         . ondansetron (ZOFRAN) 4 MG tablet   Oral   Take 1 tablet (4 mg total) by mouth every 8 (eight) hours as needed for nausea.   15 tablet   0   . oxyCODONE-acetaminophen (ROXICET) 5-325 MG per tablet   Oral   Take 1 tablet by mouth every 6 (six) hours as needed for pain.   30 tablet   0     BP 140/73  Pulse 69  Temp(Src) 97.9 F (36.6 C) (Oral)  Resp 14  Ht 5\' 8"  (1.727 m)  Wt 200 lb (90.719 kg)  BMI 30.42 kg/m2  SpO2 96%  Physical Exam  Nursing note and vitals reviewed. Constitutional: She is oriented to person, place, and time. She appears well-developed and well-nourished. No distress.  HENT:  Head: Normocephalic and atraumatic.  Eyes: Conjunctivae and EOM are normal.  Cardiovascular: Normal rate and regular rhythm.   Pulmonary/Chest: Effort normal and breath  sounds normal. No stridor. No respiratory distress.  Abdominal: She exhibits no distension. There is no hepatomegaly. There is tenderness in the left upper quadrant. There is no rigidity, no rebound, no guarding and no CVA tenderness.  Minimal tenderness to palpation, with no guarding, no rebound.  The belly is not peritoneal.   surgical scars and lower abdomen are clean, dry, intact  Musculoskeletal: She exhibits no edema.  Neurological: She is alert and oriented to person, place, and time. No cranial nerve deficit.  Skin: Skin is warm and dry.  Psychiatric: She has a normal mood and affect.    ED Course  Procedures (including critical care time)  Labs Reviewed  CBC WITH DIFFERENTIAL - Abnormal; Notable for the following:    WBC 17.2 (*)    Neutrophils Relative 90 (*)    Neutro Abs 15.4 (*)    Lymphocytes Relative 6 (*)    All other components within normal limits  COMPREHENSIVE METABOLIC PANEL - Abnormal; Notable for the following:    Glucose, Bld 136 (*)    GFR calc non Af Amer 73 (*)    GFR calc Af Amer 85 (*)    All other components within normal limits  LIPASE, BLOOD  URINALYSIS, MICROSCOPIC ONLY  POCT I-STAT TROPONIN I   Dg Abd Acute W/chest  06/26/2012  *RADIOLOGY REPORT*  Clinical Data: Upper abdominal pain with nausea and vomiting.  ACUTE ABDOMEN SERIES (ABDOMEN 2 VIEW & CHEST 1 VIEW)  Comparison: CT of the abdomen and pelvis dated 02/05/2009.  Findings: The lungs are clear and show no evidence of edema or infiltrate.  Heart size is normal.  Abdominal films show no evidence of bowel obstruction or ileus.  No free air is identified.  No abnormal calcifications.  Mild degenerative changes are present in the spine.  IMPRESSION: No acute findings.   Original Report Authenticated By: Irish Lack, M.D.      No diagnosis found.  Pulse ox 98% room air normal   Date: 06/26/2012  Rate: 76  Rhythm: normal sinus rhythm  QRS Axis: normal  Intervals: normal  ST/T Wave  abnormalities: normal  Conduction Disutrbances: none  Narrative Interpretation: unremarkable     MDM  This patient presents several weeks after scheduled hysterectomy, now with acute  onset abdominal pain vomiting, following by mouth intake.  On my exam the patient is in no distress, afebrile, with unremarkable vital signs.  Given the patient's recent surgery, postoperative the patient is a consideration, though the aforementioned absence of distress is reassuring.  The patient's labs are reassuring.  There is abnormality of urinalysis consistent with urinary tract infection or possible stone.  The patient has history of kidney stones, denies any similarities between those and at this presentation.  She is also currently taking Macrobid.  This may represent urinary tract infection refractory to Macrobid, though this seems less likely.  She was counseled on return precautions, the need to follow up with her primary care physician, discharged in stable condition.        Gerhard Munch, MD 06/26/12 2231  Gerhard Munch, MD 06/27/12 Moses Manners

## 2012-06-26 NOTE — ED Notes (Signed)
PT c/o L flank pain that radiates to LUQ since 4 am.  She ate lunch and began vomiting and experiencing epigastric and sternal pain.  Unable to tolerate po since noon.  PT d/c'd from hospital after hysterectomy and bladder tack 2 weeks prior.

## 2012-06-26 NOTE — ED Notes (Addendum)
Pt stated that 2 weeks ago she had a hystorectomy and bladder tack.  She went for a follow up visit with Dr. Juliene Pina who performed the bladder tack and results were normal. After the apt she experienced severe pain with vomiting and nausea.  Unable to keep any food or medications down.  She called Dr.Mody and was instructed to come to  [facility] ER

## 2012-06-27 ENCOUNTER — Telehealth (HOSPITAL_COMMUNITY): Payer: Self-pay | Admitting: *Deleted

## 2012-06-27 MED ORDER — FAMOTIDINE 20 MG PO TABS: 20.0000 mg | ORAL_TABLET | Freq: Two times a day (BID) | ORAL | Status: DC

## 2012-06-27 MED ORDER — SUCRALFATE 1 G PO TABS: 1.0000 g | ORAL_TABLET | Freq: Four times a day (QID) | ORAL | Status: DC

## 2012-06-28 LAB — URINE CULTURE: Colony Count: 50000

## 2012-06-29 ENCOUNTER — Telehealth (HOSPITAL_COMMUNITY): Payer: Self-pay | Admitting: Emergency Medicine

## 2012-06-29 NOTE — ED Notes (Signed)
Patient calling about Urine culture results.

## 2012-07-01 ENCOUNTER — Encounter: Payer: Self-pay | Admitting: Internal Medicine

## 2012-07-01 ENCOUNTER — Ambulatory Visit (INDEPENDENT_AMBULATORY_CARE_PROVIDER_SITE_OTHER): Payer: PRIVATE HEALTH INSURANCE | Admitting: Internal Medicine

## 2012-07-01 VITALS — BP 120/72 | HR 79 | Temp 98.5°F | Wt 197.8 lb

## 2012-07-01 DIAGNOSIS — I10 Essential (primary) hypertension: Secondary | ICD-10-CM

## 2012-07-01 DIAGNOSIS — A084 Viral intestinal infection, unspecified: Secondary | ICD-10-CM

## 2012-07-01 DIAGNOSIS — A088 Other specified intestinal infections: Secondary | ICD-10-CM

## 2012-07-01 MED ORDER — AMLODIPINE BESYLATE 5 MG PO TABS: 5.0000 mg | ORAL_TABLET | Freq: Every day | ORAL | Status: DC

## 2012-07-01 NOTE — Patient Instructions (Addendum)
It was good to see you today. We have reviewed your prior records including labs and tests today Suspect your symptoms were related to viral infection ("GI bug") - no other workup needed at this time, but call if symptoms return Medications reviewed, no changes at this time. Ok to wean off Pepcid and Carafate as discussed - let me know if you have problems with this Refill on medication(s) as discussed today. Please schedule followup in 6-12 months for blood pressure check, call sooner if problems.

## 2012-07-01 NOTE — Assessment & Plan Note (Signed)
BP Readings from Last 3 Encounters:  07/01/12 120/72  06/27/12 144/67  06/13/12 101/65   The current medical regimen is effective;  continue present plan and medications.

## 2012-07-01 NOTE — Progress Notes (Signed)
  Subjective:    Patient ID: Sara [name], female    DOB: 1945/10/25, 67 y.o.   MRN: [id number]  HPI  Here for ER follow up  Since home, no residual pain or nausea and vomiting   also reviewed chronic medical issues:  HTN - reports compliance with ongoing medical treatment and no changes in medication dose or frequency. denies adverse side effects related to current therapy.    GERD - hx surg for [facility] 2011 - no meds at this time -    Past Medical History  Diagnosis Date  . HIATAL HERNIA     s/p surgical repair  . HYPERTENSION    Review of Systems  Constitutional: Negative for fever or weight change.  Respiratory: Negative for cough and shortness of breath.   Cardiovascular: Negative for chest pain or palpitations.      Objective:   Physical Exam  BP 120/72  Pulse 79  Temp(Src) 98.5 F (36.9 C) (Oral)  Wt 197 lb 12.8 oz (89.721 kg)  BMI 30.08 kg/m2  SpO2 95% Wt Readings from Last 3 Encounters:  07/01/12 197 lb 12.8 oz (89.721 kg)  06/26/12 200 lb (90.719 kg)  06/12/12 202 lb (91.627 kg)   Constitutional: She appears well-developed and well-nourished. No distress.   Neck: Normal range of motion. Neck supple. No JVD present. No thyromegaly present.  Cardiovascular: Normal rate, regular rhythm and normal heart sounds.  No murmur heard. No BLE edema. Pulmonary/Chest: Effort normal and breath sounds normal. No respiratory distress. She has no wheezes.  Abdominal: Soft. Bowel sounds are normal. She exhibits no distension. There is no tenderness. no masses Psychiatric: She has a normal mood and affect. Her behavior is normal. Judgment and thought content normal.   Lab Results  Component Value Date   WBC 17.2* 06/26/2012   HGB 14.8 06/26/2012   HCT 41.8 06/26/2012   PLT 309 06/26/2012   GLUCOSE 136* 06/26/2012   CHOL 196 02/25/2012   TRIG 159 02/25/2012   HDL 46 02/25/2012   LDLCALC 118 02/25/2012   ALT 11 06/26/2012   AST 13 06/26/2012   NA 140 06/26/2012   K 3.9 06/26/2012   CL 103  06/26/2012   CREATININE 0.83 06/26/2012   BUN 15 06/26/2012   CO2 27 06/26/2012   TSH 1.30 02/25/2012   INR 0.95 06/05/2012   HGBA1C 5.7 04/23/2011       Assessment & Plan:   L flank/LUQ pain - ER visit for same 06/29/12 reviewed, No recurrent symptoms Suspect symptoms related to viral gastroenteritis, now resolved   Labs reviewed, KUB without obstruction or stone  No symptoms of UTI, on Macrobid postoperatively pending GYN followup  Hemodynamically stable , oxygen saturation normal  No further testing or workup needed at this time, patient to call if symptoms return

## 2012-07-03 ENCOUNTER — Encounter: Payer: Self-pay | Admitting: Internal Medicine

## 2012-07-03 ENCOUNTER — Telehealth: Payer: Self-pay | Admitting: Internal Medicine

## 2012-07-03 ENCOUNTER — Ambulatory Visit (INDEPENDENT_AMBULATORY_CARE_PROVIDER_SITE_OTHER): Payer: PRIVATE HEALTH INSURANCE | Admitting: Internal Medicine

## 2012-07-03 VITALS — BP 128/60 | HR 88 | Temp 98.0°F | Ht 68.0 in | Wt 201.0 lb

## 2012-07-03 DIAGNOSIS — N390 Urinary tract infection, site not specified: Secondary | ICD-10-CM

## 2012-07-03 LAB — POCT URINALYSIS DIPSTICK
Bilirubin, UA: NEGATIVE
Ketones, UA: NEGATIVE
pH, UA: 6

## 2012-07-03 MED ORDER — CIPROFLOXACIN HCL 500 MG PO TABS: 500.0000 mg | ORAL_TABLET | Freq: Two times a day (BID) | ORAL | Status: DC

## 2012-07-03 NOTE — Progress Notes (Signed)
HPI  Pt presents to the clinic today with c/o urinary urgency, frequency and dysuria. This started 1 week ago. She recently had a bladder tack secondary to recurrent UTI's. She is on Macrobid daily. She has followed up with urology 2 weeks ago but was not having any of the symptoms. She would like a urinalysis today to see if she has a bladder infection.   Review of Systems  Past Medical History  Diagnosis Date  . HIATAL HERNIA     s/p surgical repair  . HYPERTENSION   . Kidney stone     1990s    Family History  Problem Relation Age of Onset  . Cancer Mother   . Hypertension Other     Parent  . Breast cancer Other   . Heart disease Other     parent, grandparent    History   Social History  . Marital Status: Widowed    Spouse Name: N/A    Number of Children: N/A  . Years of Education: N/A   Occupational History  . Not on file.   Social History Main Topics  . Smoking status: Never Smoker   . Smokeless tobacco: Never Used     Comment: widowed since late 2011 (spouse had mod-adv dementia) 5 grown children, 3 nearby-,many g-kids  . Alcohol Use: No  . Drug Use: No  . Sexually Active: Yes    Birth Control/ Protection: Post-menopausal   Other Topics Concern  . Not on file   Social History Narrative  . No narrative on file    No Known Allergies  Constitutional: Pt reports fever. Denies malaise, fatigue, headache or abrupt weight changes.   GU: Pt reports urgency, frequency and pain with urination. Denies burning sensation, blood in urine, odor or discharge. Skin: Denies redness, rashes, lesions or ulcercations.   No other specific complaints in a complete review of systems (except as listed in HPI above).    Objective:   Physical Exam  BP 128/60  Pulse 88  Temp(Src) 98 F (36.7 C) (Oral)  Ht 5\' 8"  (1.727 m)  Wt 201 lb (91.173 kg)  BMI 30.57 kg/m2  SpO2 96% Wt Readings from Last 3 Encounters:  07/03/12 201 lb (91.173 kg)  07/01/12 197 lb 12.8 oz  (89.721 kg)  06/26/12 200 lb (90.719 kg)    General: Appears her stated age, well developed, well nourished in NAD. Cardiovascular: Normal rate and rhythm. S1,S2 noted.  No murmur, rubs or gallops noted. No JVD or BLE edema. No carotid bruits noted. Pulmonary/Chest: Normal effort and positive vesicular breath sounds. No respiratory distress. No wheezes, rales or ronchi noted.  Abdomen: Soft and nontender. Normal bowel sounds, no bruits noted. No distention or masses noted. Liver, spleen and kidneys non palpable. Tender to palpation over the bladder area. No CVA tenderness.      Assessment & Plan:   Urinary Tract infection  eRx sent if for Cipro 500 mg BID x 5 days Tylenol as needed for fever Drink plenty of fluids  RTC as needed or if symptoms persist. If worse over the weekend, go to ER

## 2012-07-03 NOTE — Patient Instructions (Signed)
Urinary Tract Infection Urinary tract infections (UTIs) can develop anywhere along your urinary tract. Your urinary tract is your body's drainage system for removing wastes and extra water. Your urinary tract includes two kidneys, two ureters, a bladder, and a urethra. Your kidneys are a pair of bean-shaped organs. Each kidney is about the size of your fist. They are located below your ribs, one on each side of your spine. CAUSES Infections are caused by microbes, which are microscopic organisms, including fungi, viruses, and bacteria. These organisms are so small that they can only be seen through a microscope. Bacteria are the microbes that most commonly cause UTIs. SYMPTOMS  Symptoms of UTIs may vary by age and gender of the patient and by the location of the infection. Symptoms in young women typically include a frequent and intense urge to urinate and a painful, burning feeling in the bladder or urethra during urination. Older women and men are more likely to be tired, shaky, and weak and have muscle aches and abdominal pain. A fever may mean the infection is in your kidneys. Other symptoms of a kidney infection include pain in your back or sides below the ribs, nausea, and vomiting. DIAGNOSIS To diagnose a UTI, your caregiver will ask you about your symptoms. Your caregiver also will ask to provide a urine sample. The urine sample will be tested for bacteria and white blood cells. White blood cells are made by your body to help fight infection. TREATMENT  Typically, UTIs can be treated with medication. Because most UTIs are caused by a bacterial infection, they usually can be treated with the use of antibiotics. The choice of antibiotic and length of treatment depend on your symptoms and the type of bacteria causing your infection. HOME CARE INSTRUCTIONS  If you were prescribed antibiotics, take them exactly as your caregiver instructs you. Finish the medication even if you feel better after you  have only taken some of the medication.  Drink enough water and fluids to keep your urine clear or pale yellow.  Avoid caffeine, tea, and carbonated beverages. They tend to irritate your bladder.  Empty your bladder often. Avoid holding urine for long periods of time.  Empty your bladder before and after sexual intercourse.  After a bowel movement, women should cleanse from front to back. Use each tissue only once. SEEK MEDICAL CARE IF:   You have back pain.  You develop a fever.  Your symptoms do not begin to resolve within 3 days. SEEK IMMEDIATE MEDICAL CARE IF:   You have severe back pain or lower abdominal pain.  You develop chills.  You have nausea or vomiting.  You have continued burning or discomfort with urination. MAKE SURE YOU:   Understand these instructions.  Will watch your condition.  Will get help right away if you are not doing well or get worse. Document Released: 12/19/2004 Document Revised: 09/10/2011 Document Reviewed: 04/19/2011 Exit[employer] Patient Information 2013 [employer].  

## 2012-07-03 NOTE — Telephone Encounter (Signed)
Patient Information:  Caller Name: Alan  Phone: [phone]  Patient: Sara [name], Sara [name]  Gender: Female  DOB: Oct 03, 1945  Age: 67 Years  PCP: Rene Paci (Adults only)  Office Follow Up:  Does the office need to follow up with this patient?: No  Instructions For The Office: N/A  RN Note:  Drink fluids, urinate when needed. Do not delay urination.    Symptoms  Reason For Call & Symptoms: Patient states she had hysterectomy and bladder tack on 06/12/12. She has already been for her two week check up.  She states had a viral infection 06/27/12 which landed her in the ER.  She followed up with Dr. Felicity Coyer on 07/01/12.  She reports she has been having on/off issues with urination.  She is currently on macrobid 100mg  at bedtime  H.S.  She states since she was seen in the ER she has had low grade fever,  Temperature 99.5 (o) . She does not have a good "steady flow and pressure with stabbing pains"  Reviewed Health History In EMR: Yes  Reviewed Medications In EMR: Yes  Reviewed Allergies In EMR: Yes  Reviewed Surgeries / Procedures: Yes  Date of Onset of Symptoms: 07/03/2012  Treatments Tried: Macrobid  Treatments Tried Worked: No  Guideline(s) Used:  Urination Pain - Female  Disposition Per Guideline:   See Today in Office  Reason For Disposition Reached:   Age > 50 years  Advice Given:  N/A  Patient Will Follow Care Advice:  YES  Appointment Scheduled:  07/03/2012 15:45:00 Appointment Scheduled Provider:  Nicki Reaper

## 2012-08-26 ENCOUNTER — Encounter: Payer: Self-pay | Admitting: Internal Medicine

## 2012-08-26 ENCOUNTER — Ambulatory Visit (INDEPENDENT_AMBULATORY_CARE_PROVIDER_SITE_OTHER): Payer: PRIVATE HEALTH INSURANCE | Admitting: Internal Medicine

## 2012-08-26 VITALS — BP 132/84 | HR 73 | Temp 98.5°F | Wt 197.4 lb

## 2012-08-26 DIAGNOSIS — K449 Diaphragmatic hernia without obstruction or gangrene: Secondary | ICD-10-CM

## 2012-08-26 DIAGNOSIS — R1012 Left upper quadrant pain: Secondary | ICD-10-CM

## 2012-08-26 DIAGNOSIS — R1110 Vomiting, unspecified: Secondary | ICD-10-CM

## 2012-08-26 NOTE — Progress Notes (Signed)
Subjective:    Patient ID: Sara [name], female    DOB: Aug 02, 1945, 67 y.o.   MRN: [id number]  Abdominal Pain This is a recurrent problem. The current episode started 1 to 4 weeks ago. The onset quality is gradual. The problem occurs intermittently. The problem has been waxing and waning. The pain is located in the LUQ and epigastric region. The pain is moderate. The quality of the pain is dull, a sensation of fullness and colicky. The abdominal pain does not radiate. Associated symptoms include nausea and vomiting. Pertinent negatives include no anorexia, belching, constipation, diarrhea, fever, flatus, hematochezia or weight loss. The pain is aggravated by eating and movement. The pain is relieved by nothing. She has tried H2 blockers for the symptoms. The treatment provided no relief. Prior diagnostic workup includes GI consult and CT scan (In 2011 prior to hiatal hernia surgical repair). Her past medical history is significant for abdominal surgery. There is no history of gallstones or GERD.     Past Medical History  Diagnosis Date  . HIATAL HERNIA     s/p surgical repair  . HYPERTENSION   . Kidney stone     1990s     Review of Systems  Constitutional: Negative for fever and weight loss.  Gastrointestinal: Positive for nausea, vomiting and abdominal pain. Negative for diarrhea, constipation, hematochezia, anorexia and flatus.       Objective:   Physical Exam BP 132/84  Pulse 73  Temp(Src) 98.5 F (36.9 C) (Oral)  Wt 197 lb 6.4 oz (89.54 kg)  BMI 30.02 kg/m2  SpO2 97% Wt Readings from Last 3 Encounters:  08/26/12 197 lb 6.4 oz (89.54 kg)  07/03/12 201 lb (91.173 kg)  07/01/12 197 lb 12.8 oz (89.721 kg)   Constitutional: She appears well-developed and well-nourished. No distress.  Neck: Normal range of motion. Neck supple. No JVD present. No thyromegaly present.  Cardiovascular: Normal rate, regular rhythm and normal heart sounds.  No murmur heard. No BLE  edema. Pulmonary/Chest: Effort normal and breath sounds normal. No respiratory distress. She has no wheezes.  Abdominal: Soft. Bowel sounds are normal. She exhibits no distension. There is no tenderness. no masses Skin: Skin is warm and dry. No rash noted. No erythema.  Psychiatric: She has a normal mood and affect. Her behavior is normal. Judgment and thought content normal.   Lab Results  Component Value Date   WBC 17.2* 06/26/2012   HGB 14.8 06/26/2012   HCT 41.8 06/26/2012   PLT 309 06/26/2012   GLUCOSE 136* 06/26/2012   CHOL 196 02/25/2012   TRIG 159 02/25/2012   HDL 46 02/25/2012   LDLCALC 118 02/25/2012   ALT 11 06/26/2012   AST 13 06/26/2012   NA 140 06/26/2012   K 3.9 06/26/2012   CL 103 06/26/2012   CREATININE 0.83 06/26/2012   BUN 15 06/26/2012   CO2 27 06/26/2012   TSH 1.30 02/25/2012   INR 0.95 06/05/2012   HGBA1C 5.7 04/23/2011       Assessment & Plan:   Left upper quadrant and epigastric pain/fullness, postprandial History of large hiatal hernia status post surgical repair in 2011 Nausea with vomiting - described regurgitation of meals greater than 72 hours after ingestion  Recent lab data reviewed from ER evaluation for gastroenteritis Order recheck CBC, BMet and LFT - patient will have performed at her office and results faxed to [place] Check CT abdomen pelvis with contrast given prior surgical history for further anatomic evaluation Consider referral to gastroenterology  for upper endoscopy versus general surgery depending on symptoms and CT findings Medications reviewed, no changes recommended at this time

## 2012-08-26 NOTE — Patient Instructions (Signed)
It was good to see you today. Test(s) ordered today. Your results will be released to MyChart (or called to you) after review, usually within 72hours after test completion. If any changes need to be made, you will be notified at that same time. we'll make referral for CT scan . Our office will contact you regarding appointment(s) once made. Medications reviewed and updated, no changes recommended at this time. What we do next depends on what we find and how you feel - call sooner if worse

## 2012-08-31 LAB — CBC AND DIFFERENTIAL
Platelets: 391 10*3/uL (ref 150–399)
WBC: 8.3 10^3/mL

## 2012-08-31 LAB — BASIC METABOLIC PANEL
BUN: 17 mg/dL (ref 4–21)
Creatinine: 0.9 mg/dL (ref 0.5–1.1)

## 2012-09-01 ENCOUNTER — Encounter: Payer: Self-pay | Admitting: Internal Medicine

## 2012-09-02 ENCOUNTER — Ambulatory Visit (INDEPENDENT_AMBULATORY_CARE_PROVIDER_SITE_OTHER)
Admission: RE | Admit: 2012-09-02 | Discharge: 2012-09-02 | Disposition: A | Discharge status: Home / Self Care | Payer: PRIVATE HEALTH INSURANCE | Source: Ambulatory Visit | Attending: Internal Medicine | Admitting: Internal Medicine

## 2012-09-02 DIAGNOSIS — R1110 Vomiting, unspecified: Secondary | ICD-10-CM

## 2012-09-02 DIAGNOSIS — R1012 Left upper quadrant pain: Secondary | ICD-10-CM

## 2012-09-02 DIAGNOSIS — K449 Diaphragmatic hernia without obstruction or gangrene: Secondary | ICD-10-CM

## 2012-09-02 MED ORDER — IOHEXOL 300 MG/ML  SOLN
100.0000 mL | Freq: Once | INTRAMUSCULAR | Status: AC | PRN
  Administered 2012-09-02: 100 mL via INTRAVENOUS

## 2012-09-21 ENCOUNTER — Ambulatory Visit (INDEPENDENT_AMBULATORY_CARE_PROVIDER_SITE_OTHER): Payer: PRIVATE HEALTH INSURANCE | Admitting: Internal Medicine

## 2012-09-21 ENCOUNTER — Encounter: Payer: Self-pay | Admitting: Internal Medicine

## 2012-09-21 VITALS — BP 140/82 | HR 71 | Temp 98.7°F | Wt 195.8 lb

## 2012-09-21 DIAGNOSIS — R109 Unspecified abdominal pain: Secondary | ICD-10-CM

## 2012-09-21 DIAGNOSIS — K449 Diaphragmatic hernia without obstruction or gangrene: Secondary | ICD-10-CM

## 2012-09-21 DIAGNOSIS — I10 Essential (primary) hypertension: Secondary | ICD-10-CM

## 2012-09-21 DIAGNOSIS — K819 Cholecystitis, unspecified: Secondary | ICD-10-CM

## 2012-09-21 NOTE — Patient Instructions (Signed)
It was good to see you today. we'll make referral for general surgery eval to follow up on pain, hiatal hernia and gallbladder issues as seen on CT scan. Our office will contact you regarding appointment(s) once made. Medications reviewed and updated, no changes recommended at this time. What we do next depends on what we find and how you feel - call sooner if worse

## 2012-09-21 NOTE — Progress Notes (Signed)
Subjective:    Patient ID: Sara [name], female    DOB: [birth year], 67 y.o.   MRN: [id number]  HPI Patient presents for a follow up of LUQ pain.  Patient was seen in the office 08/26/12 with complaints of LUQ pain and vomiting. CT scan 09/02/12 revealed a thickened gall bladder wall and suspected cholecystitis. The presence of a hiatal hernia was also noted.  At today's visit patient reports that she has had two episodes of pain since. These episodes have been milder and tend to be located more in the epigastrium region and are unrelated to food consumption. Pain is described as shooting and stabbing and lasts for approximately 10-20 minutes. Has also noted more belching than usual and feels as though there is air trapped in her esophagus.    Review of Systems   Cardiac: Denies chest pain, chest tightness, palpitations Pulm: Denies SOB, cough GI: Reports some noted nausea, however no vomiting. Reports one incidence of diarrhea after pain episode, otherwise reports no melena, hematochezia, diarrhea, constipation, abdominal cramping, acid reflux Neuro: Denies paraesthesias  Objective:   Physical Exam BP 140/82  Pulse 71  Temp(Src) 98.7 F (37.1 C) (Oral)  Wt 195 lb 12.8 oz (88.814 kg)  BMI 29.78 kg/m2  SpO2 95% Wt Readings from Last 3 Encounters:  09/21/12 195 lb 12.8 oz (88.814 kg)  08/26/12 197 lb 6.4 oz (89.54 kg)  07/03/12 201 lb (91.173 kg)   Constitutional: She appears well-developed and well-nourished. No distress.  Cardiovascular: Normal rate, regular rhythm and normal heart sounds.  No murmur heard. No BLE edema. Pulmonary/Chest: Effort normal and breath sounds normal. No respiratory distress. She has no wheezes.  Abdominal: Soft. Bowel sounds are normal. She exhibits no distension. There is no tenderness. no masses Skin: Skin is warm and dry. No rash noted. No erythema.  Psychiatric: She has a normal mood and affect. Her behavior is normal. Judgment and thought content  normal.   Lab Results  Component Value Date   WBC 8.3 08/31/2012   HGB 13.4 08/31/2012   HCT 39 08/31/2012   PLT 391 08/31/2012   GLUCOSE 136* 06/26/2012   CHOL 196 02/25/2012   TRIG 159 02/25/2012   HDL 46 02/25/2012   LDLCALC 118 02/25/2012   ALT 10 08/31/2012   AST 14 08/31/2012   NA 143 08/31/2012   K 4.6 08/31/2012   CL 103 06/26/2012   CREATININE 0.9 08/31/2012   BUN 17 08/31/2012   CO2 27 06/26/2012   TSH 1.30 02/25/2012   INR 0.95 06/05/2012   HGBA1C 5.7 04/23/2011   Ct Abdomen Pelvis W Contrast  09/02/2012   *RADIOLOGY REPORT*  Clinical Data: Left upper abdomen , epigastric, and pelvic pain. Previous hiatal hernia repair.  CT ABDOMEN AND PELVIS WITH CONTRAST  Technique:  Multidetector CT imaging of the abdomen and pelvis was performed following the standard protocol during bolus administration of intravenous contrast.  Contrast: OMNIPAQUE IOHEXOL 300 MG/ML  SOLN  Comparison: 02/05/2009  Findings: Visualized lung bases clear.  Moderate hiatal hernia involving most the fundus, with distention of the visualized distal esophagus by ingested material.  Marked wall thickening of the gallbladder with adjacent inflammatory/edematous changes.  Mild central intrahepatic biliary ductal prominence.  The common duct can be seen down to the level of the ampulla.  Unremarkable liver parenchyma, spleen, adrenal glands, kidneys, pancreas.  Patchy aortoiliac arterial calcifications without aneurysm.  The small bowel and colon are nondilated. Normal appendix.  Scattered sigmoid diverticula without adjacent inflammatory/edematous  change.  Urinary bladder is physiologically distended.  No ascites.  No free air.  No adenopathy.  Normal bilateral renal excretion on delayed scans.  IMPRESSION:  1.  Marked circumferential gallbladder wall thickening with adjacent inflammatory/edematous changes suggesting cholecystitis. 2.  Moderate hiatal hernia. 3.  Sigmoid diverticulosis.   Original Report Authenticated By: D. Andria Rhein, MD       Assessment & Plan:  Abdominal pain, episodic Cholecystitis, changes on CT reviewed Hiatal hernia, prior repair reviewed  Referral to general surgery for assessment due to Episodic abdominal pain in setting of gall bladder wall thickening on CTas well as PMH of hiatal hernia repair with ?recurrence on CT. Patients past hiatal hernia repair surgery was done by Dr. Daphine Deutscher, and requests a referral back to him  Leandra Kern  PA-S  I have personally reviewed this case with PA student. I also personally examined this patient. I agree with history and findings as documented above. I reviewed, discussed and approve of the assessment and plan as listed above. Rene Paci, MD  Time spent with pt today 25 minutes, greater than 50% time spent counseling patient on hiatal hernia, cholecystitis on CT and abdominal pain -also medication review and review of prior records. Advised on small frequent meals and low-fat diet to help control pain symptoms given the above potential problems causing same

## 2012-09-21 NOTE — Assessment & Plan Note (Signed)
BP Readings from Last 3 Encounters:  09/21/12 140/82  08/26/12 132/84  07/03/12 128/60   The current medical regimen is effective;  continue present plan and medications.

## 2012-10-07 ENCOUNTER — Ambulatory Visit (INDEPENDENT_AMBULATORY_CARE_PROVIDER_SITE_OTHER): Payer: PRIVATE HEALTH INSURANCE | Admitting: Surgery

## 2012-10-07 ENCOUNTER — Other Ambulatory Visit (INDEPENDENT_AMBULATORY_CARE_PROVIDER_SITE_OTHER): Payer: Self-pay

## 2012-10-07 ENCOUNTER — Encounter (INDEPENDENT_AMBULATORY_CARE_PROVIDER_SITE_OTHER): Payer: Self-pay | Admitting: Surgery

## 2012-10-07 VITALS — BP 160/108 | HR 76 | Resp 14 | Ht 69.0 in | Wt 196.0 lb

## 2012-10-07 DIAGNOSIS — R109 Unspecified abdominal pain: Secondary | ICD-10-CM

## 2012-10-07 NOTE — Progress Notes (Signed)
Chief Complaint:  Thick walled gallbladder seen on CT scan. Hiatal hernia noted on CT scan as well  History of Present Illness:  Sara [name] is an 67 y.o. female who had two thirds of her stomach taken down from a large type III hiatal hernia in 2011. She has done well until recently she was having some left upper quadrant abdominal pain and underwent a CT scan which showed a very thick wall gallbladder. The patient has had no pain on the right side but just occasional sharp pains on the left. She also had a hiatal hernia noted in the CT scan and this is the site of her hiatal hernia repair 2011. I reviewed the scan and is small compared to what she had in 2011. He doesn't seem to be compromising her swallowing. I think it would be best we get an upper GI series to assess this and also to get a gallbladder ultrasound to see nature of this thickwalled change.  Past Medical History  Diagnosis Date  . HIATAL HERNIA     s/p surgical repair  . HYPERTENSION   . Kidney stone     1990s    Past Surgical History  Procedure Laterality Date  . Tonsillectomy  1968  . Cataract extraction  2009  . Hernia repair  2011  . Lithotripsy    . Vaginal hysterectomy N/A 06/12/2012    Procedure: HYSTERECTOMY VAGINAL;  Surgeon: Robley Fries, MD;  Location: [facility];  Service: Gynecology;  Laterality: N/A;  . Cystocele repair N/A 06/12/2012    Procedure: ANTERIOR REPAIR (CYSTOCELE);  Surgeon: Robley Fries, MD;  Location: [facility];  Service: Gynecology;  Laterality: N/A;    Current Outpatient Prescriptions  Medication Sig Dispense Refill  . amLODipine (NORVASC) 5 MG tablet Take 1 tablet (5 mg total) by mouth daily.  30 tablet  11  . Cholecalciferol 1000 UNITS tablet Take 3,000 Units by mouth daily.      Marland Kitchen estradiol (ESTRACE) 0.1 MG/GM vaginal cream Place 0.5 g vaginally 2 (two) times a week.      . Multiple Vitamin (MULTIVITAMIN WITH MINERALS) TABS Take 1 tablet by mouth daily.      . famotidine (PEPCID) 20  MG tablet Take 1 tablet (20 mg total) by mouth 2 (two) times daily.  30 tablet  0   No current facility-administered medications for this visit.   Review of patient's allergies indicates no known allergies. Family History  Problem Relation Age of Onset  . Cancer Mother   . Hypertension Other     Parent  . Breast cancer Other   . Heart disease Other     parent, grandparent   Social History:   reports that she has never smoked. She has never used smokeless tobacco. She reports that she does not drink alcohol or use illicit drugs.   REVIEW OF SYSTEMS - PERTINENT POSITIVES ONLY: noncontributory  Physical Exam:   Blood pressure 160/108, pulse 76, resp. rate 14, height 5\' 9"  (1.753 m), weight 196 lb (88.905 kg). Body mass index is 28.93 kg/(m^2).  Gen:  WDWN white female NAD  Neurological: Alert and oriented to person, place, and time. Motor and sensory function is grossly intact  Head: Normocephalic and atraumatic.  Eyes: Conjunctivae are normal. Pupils are equal, round, and reactive to light. No scleral icterus.  Neck: Normal range of motion. Neck supple. No tracheal deviation or thyromegaly present.   Abdomen:  nontender at present.  LABORATORY RESULTS: No results found for  this or any previous visit (from the past 48 hour(s)).  RADIOLOGY RESULTS: No results found.  Problem List: Patient Active Problem List   Diagnosis Date Noted  . S/P vaginal hysterectomy 06/13/2012  . DEPRESSION, SITUATIONAL 02/20/2009  . GERD 02/20/2009  . RENAL CALCULUS, HX OF 02/20/2009  . CHICKENPOX, HX OF 02/20/2009  . HYPERTENSION 02/14/2009  . HIATAL HERNIA 02/14/2009  . SYNCOPE 02/05/2009    Assessment & Plan: Unusual CT finding of thick wall gallbladder. Questionable recurrent hiatal hernia on CT needs to be examined with upper GI series. Will obtain those studies and then will see her back to review them.    Matt B. Daphine Deutscher, MD, [facility] [phone]  beeper [phone]  10/07/2012 1:54 PM

## 2012-10-07 NOTE — Patient Instructions (Signed)
Thanks for your patience.  If you need further assistance after leaving the office, please call our office and speak with a [facility] nurse.  [phone].  If you want to leave a message for Dr. Mackenzee Becvar, please call his office phone at [phone]. 

## 2012-10-12 ENCOUNTER — Telehealth (INDEPENDENT_AMBULATORY_CARE_PROVIDER_SITE_OTHER): Payer: Self-pay | Admitting: General Surgery

## 2012-10-12 NOTE — Telephone Encounter (Signed)
Spoke with patient and explained that she has an [place] and UGI scheduled on 10/15/12 at 7:45 at [facility].

## 2012-10-15 ENCOUNTER — Ambulatory Visit
Admission: RE | Admit: 2012-10-15 | Discharge: 2012-10-15 | Disposition: A | Discharge status: Home / Self Care | Payer: PRIVATE HEALTH INSURANCE | Source: Ambulatory Visit | Attending: Surgery | Admitting: Surgery

## 2012-10-20 ENCOUNTER — Ambulatory Visit (INDEPENDENT_AMBULATORY_CARE_PROVIDER_SITE_OTHER): Payer: PRIVATE HEALTH INSURANCE | Admitting: Surgery

## 2012-10-20 ENCOUNTER — Encounter (INDEPENDENT_AMBULATORY_CARE_PROVIDER_SITE_OTHER): Payer: Self-pay | Admitting: Surgery

## 2012-10-20 VITALS — BP 150/78 | HR 84 | Resp 16 | Ht 68.0 in | Wt 196.0 lb

## 2012-10-20 DIAGNOSIS — K811 Chronic cholecystitis: Secondary | ICD-10-CM | POA: Insufficient documentation

## 2012-10-20 NOTE — Patient Instructions (Signed)

## 2012-10-20 NOTE — Progress Notes (Signed)
Chief Complaint:  Gallbladder wall thickening and recurrent attacks of upper abdominal pain  History of Present Illness:  Sara [name] is an 67 y.o. female on whom I performed a large type III hiatal hernia repair in 2011. She has had some bouts of abdominal pain ultrasound has gallbladder wall thickening and evidence of chronic cholecystitis. I have discussed laparoscopic cholecystectomy with her and she was going proceed to have that repaired. She had an upper GI series that suggested that she might have some delayed gastric emptying. There is also question of some distal gastric ulcers. I think that at the time of her laparoscopic cholecystectomy on her perform an upper endoscopy. We'll go ahead and schedule the [employer] at her convenience.  Past Medical History  Diagnosis Date  . HIATAL HERNIA     s/p surgical repair  . HYPERTENSION   . Kidney stone     1990s    Past Surgical History  Procedure Laterality Date  . Tonsillectomy  1968  . Cataract extraction  2009  . Hernia repair  2011  . Lithotripsy    . Vaginal hysterectomy N/A 06/12/2012    Procedure: HYSTERECTOMY VAGINAL;  Surgeon: Robley Fries, MD;  Location: [facility];  Service: Gynecology;  Laterality: N/A;  . Cystocele repair N/A 06/12/2012    Procedure: ANTERIOR REPAIR (CYSTOCELE);  Surgeon: Robley Fries, MD;  Location: [facility];  Service: Gynecology;  Laterality: N/A;    Current Outpatient Prescriptions  Medication Sig Dispense Refill  . amLODipine (NORVASC) 5 MG tablet Take 1 tablet (5 mg total) by mouth daily.  30 tablet  11  . Cholecalciferol 1000 UNITS tablet Take 3,000 Units by mouth daily.      Marland Kitchen estradiol (ESTRACE) 0.1 MG/GM vaginal cream Place 0.5 g vaginally 2 (two) times a week.      . famotidine (PEPCID) 20 MG tablet Take 1 tablet (20 mg total) by mouth 2 (two) times daily.  30 tablet  0  . Multiple Vitamin (MULTIVITAMIN WITH MINERALS) TABS Take 1 tablet by mouth daily.       No current  facility-administered medications for this visit.   Review of patient's allergies indicates no known allergies. Family History  Problem Relation Age of Onset  . Cancer Mother   . Hypertension Other     Parent  . Breast cancer Other   . Heart disease Other     parent, grandparent   Social History:   reports that she has never smoked. She has never used smokeless tobacco. She reports that she does not drink alcohol or use illicit drugs.   REVIEW OF SYSTEMS - PERTINENT POSITIVES ONLY: No complaints of GERD   Physical Exam:   Blood pressure 150/78, pulse 84, resp. rate 16, height 5\' 8"  (1.727 m), weight 196 lb (88.905 kg). Body mass index is 29.81 kg/(m^2).  Gen:  WDWN WF NAD  Neurological: Alert and oriented to person, place, and time. Motor and sensory function is grossly intact  Head: Normocephalic and atraumatic.  Eyes: Conjunctivae are normal. Pupils are equal, round, and reactive to light. No scleral icterus.  Neck: Normal range of motion. Neck supple. No tracheal deviation or thyromegaly present.  Cardiovascular:  SR without murmurs or gallops.  No carotid bruits Respiratory: Effort normal.  No respiratory distress. No chest wall tenderness. Breath sounds normal.  No wheezes, rales or rhonchi.  Abdomen:  nontender GU: Musculoskeletal: Normal range of motion. Extremities are nontender. No cyanosis, edema or clubbing noted Lymphadenopathy: No  cervical, preauricular, postauricular or axillary adenopathy is present Skin: Skin is warm and dry. No rash noted. No diaphoresis. No erythema. No pallor. Pscyh: Normal mood and affect. Behavior is normal. Judgment and thought content normal.   LABORATORY RESULTS: No results found for this or any previous visit (from the past 48 hour(s)).  RADIOLOGY RESULTS: No results found.  Problem List: Patient Active Problem List   Diagnosis Date Noted  . Chronic cholecystitis 10/20/2012  . S/P vaginal hysterectomy 06/13/2012  . DEPRESSION,  SITUATIONAL 02/20/2009  . GERD 02/20/2009  . RENAL CALCULUS, HX OF 02/20/2009  . CHICKENPOX, HX OF 02/20/2009  . HYPERTENSION 02/14/2009  . HIATAL HERNIA 02/14/2009  . SYNCOPE 02/05/2009    Assessment & Plan: Chronic cholecystitis;  UGI suggesting distal gastritis  Lap chole and upper endoscopy at [facility] B. Daphine Deutscher, MD, [facility] [phone] beeper [phone]  10/20/2012 3:03 PM

## 2012-10-23 ENCOUNTER — Encounter (HOSPITAL_COMMUNITY): Payer: Self-pay | Admitting: Pharmacy Technician

## 2012-10-28 ENCOUNTER — Other Ambulatory Visit: Payer: Self-pay

## 2012-10-28 NOTE — Patient Instructions (Signed)
SHANTAY SONN  10/28/2012   Your procedure is scheduled on: 11/02/12              Surgery 0945am-1215pm   Report to [facility] a    [id number]  AM.  Call this number if you have problems the morning of surgery: [phone]   Remember: Fleets enema nite before surgery    Do not eat food or drink liquids after midnight.   Take these medicines the morning of surgery with A SIP OF WATER:    Do not wear jewelry, make-up or nail polish.  Do not wear lotions, powders, or perfumes.   Do not shave 48 hours prior to surgery.   Do not bring valuables to the hospital.  Contacts, dentures or bridgework may not be worn into surgery.      Patients discharged the day of surgery will not be allowed to drive  home.  Name and phone number of your driver:    SEE CHG INSTRUCTION SHEET    Please read over the following fact sheets that you were given:  coughing and deep breathing exercises, leg exercises               Failure to comply with these instructions may result in cancellation of your surgery.                Patient Signature ____________________________              Nurse Signature _____________________________

## 2012-10-29 ENCOUNTER — Inpatient Hospital Stay (HOSPITAL_COMMUNITY): Admission: RE | Admit: 2012-10-29 | Payer: PRIVATE HEALTH INSURANCE | Source: Ambulatory Visit

## 2012-10-29 ENCOUNTER — Encounter (HOSPITAL_COMMUNITY): Payer: Self-pay

## 2012-10-29 ENCOUNTER — Encounter (HOSPITAL_COMMUNITY)
Admission: RE | Admit: 2012-10-29 | Discharge: 2012-10-29 | Disposition: A | Discharge status: Home / Self Care | Payer: PRIVATE HEALTH INSURANCE | Source: Ambulatory Visit | Attending: Surgery | Admitting: Surgery

## 2012-10-29 DIAGNOSIS — Z01812 Encounter for preprocedural laboratory examination: Secondary | ICD-10-CM | POA: Insufficient documentation

## 2012-10-29 HISTORY — DX: Gastro-esophageal reflux disease without esophagitis: K21.9

## 2012-10-29 LAB — CBC
HCT: 41.7 % (ref 36.0–46.0)
MCHC: 32.9 g/dL (ref 30.0–36.0)
MCV: 85.3 fL (ref 78.0–100.0)
RDW: 15.3 % (ref 11.5–15.5)

## 2012-10-29 LAB — BASIC METABOLIC PANEL
BUN: 13 mg/dL (ref 6–23)
Creatinine, Ser: 0.95 mg/dL (ref 0.50–1.10)
GFR calc Af Amer: 71 mL/min — ABNORMAL LOW (ref >90)
GFR calc non Af Amer: 62 mL/min — ABNORMAL LOW (ref >90)

## 2012-11-02 ENCOUNTER — Encounter (HOSPITAL_COMMUNITY): Payer: Self-pay | Admitting: Anesthesiology

## 2012-11-02 ENCOUNTER — Ambulatory Visit (HOSPITAL_COMMUNITY): Payer: PRIVATE HEALTH INSURANCE

## 2012-11-02 ENCOUNTER — Encounter (HOSPITAL_COMMUNITY)
Admission: RE | Disposition: A | Discharge status: Home / Self Care | Payer: Self-pay | Source: Ambulatory Visit | Attending: Surgery

## 2012-11-02 ENCOUNTER — Encounter (HOSPITAL_COMMUNITY): Payer: Self-pay | Admitting: *Deleted

## 2012-11-02 ENCOUNTER — Inpatient Hospital Stay (HOSPITAL_COMMUNITY)
Admission: RE | Admit: 2012-11-02 | Discharge: 2012-11-05 | DRG: 416 | Disposition: A | Discharge status: Home / Self Care | Payer: PRIVATE HEALTH INSURANCE | Source: Ambulatory Visit | Attending: Surgery | Admitting: Surgery

## 2012-11-02 ENCOUNTER — Ambulatory Visit (HOSPITAL_COMMUNITY): Payer: PRIVATE HEALTH INSURANCE | Admitting: Anesthesiology

## 2012-11-02 DIAGNOSIS — K811 Chronic cholecystitis: Secondary | ICD-10-CM

## 2012-11-02 DIAGNOSIS — K828 Other specified diseases of gallbladder: Secondary | ICD-10-CM

## 2012-11-02 DIAGNOSIS — K8000 Calculus of gallbladder with acute cholecystitis without obstruction: Principal | ICD-10-CM | POA: Diagnosis present

## 2012-11-02 DIAGNOSIS — Z87442 Personal history of urinary calculi: Secondary | ICD-10-CM

## 2012-11-02 DIAGNOSIS — I10 Essential (primary) hypertension: Secondary | ICD-10-CM | POA: Diagnosis present

## 2012-11-02 DIAGNOSIS — Z9049 Acquired absence of other specified parts of digestive tract: Secondary | ICD-10-CM

## 2012-11-02 DIAGNOSIS — K8010 Calculus of gallbladder with chronic cholecystitis without obstruction: Secondary | ICD-10-CM | POA: Diagnosis present

## 2012-11-02 DIAGNOSIS — F4321 Adjustment disorder with depressed mood: Secondary | ICD-10-CM | POA: Diagnosis present

## 2012-11-02 DIAGNOSIS — Z5331 Laparoscopic surgical procedure converted to open procedure: Secondary | ICD-10-CM

## 2012-11-02 DIAGNOSIS — Z79899 Other long term (current) drug therapy: Secondary | ICD-10-CM

## 2012-11-02 DIAGNOSIS — K660 Peritoneal adhesions (postprocedural) (postinfection): Secondary | ICD-10-CM | POA: Diagnosis present

## 2012-11-02 DIAGNOSIS — K8020 Calculus of gallbladder without cholecystitis without obstruction: Secondary | ICD-10-CM

## 2012-11-02 DIAGNOSIS — K219 Gastro-esophageal reflux disease without esophagitis: Secondary | ICD-10-CM | POA: Diagnosis present

## 2012-11-02 HISTORY — PX: UPPER GI ENDOSCOPY: SHX6162

## 2012-11-02 HISTORY — PX: CHOLECYSTECTOMY: SHX55

## 2012-11-02 LAB — CREATININE, SERUM
Creatinine, Ser: 0.99 mg/dL (ref 0.50–1.10)
GFR calc non Af Amer: 59 mL/min — ABNORMAL LOW (ref >90)

## 2012-11-02 LAB — CBC
Hemoglobin: 12.7 g/dL (ref 12.0–15.0)
MCH: 28.4 pg (ref 26.0–34.0)
Platelets: 221 10*3/uL (ref 150–400)
RBC: 4.47 MIL/uL (ref 3.87–5.11)
WBC: 15.9 10*3/uL — ABNORMAL HIGH (ref 4.0–10.5)

## 2012-11-02 SURGERY — CHOLECYSTECTOMY
Anesthesia: General | Site: Esophagus | Wound class: Dirty or Infected

## 2012-11-02 MED ORDER — DEXAMETHASONE SODIUM PHOSPHATE 10 MG/ML IJ SOLN
INTRAMUSCULAR | Status: DC | PRN
  Administered 2012-11-02: 10 mg via INTRAVENOUS

## 2012-11-02 MED ORDER — CHLORHEXIDINE GLUCONATE 4 % EX LIQD
1.0000 "application " | Freq: Once | CUTANEOUS | Status: DC
  Filled 2012-11-02: qty 15

## 2012-11-02 MED ORDER — HYDROMORPHONE HCL PF 1 MG/ML IJ SOLN
INTRAMUSCULAR | Status: AC
  Filled 2012-11-02: qty 1

## 2012-11-02 MED ORDER — AMLODIPINE BESYLATE 5 MG PO TABS
5.0000 mg | ORAL_TABLET | Freq: Every morning | ORAL | Status: DC
  Administered 2012-11-03 – 2012-11-04 (×2): 5 mg via ORAL
  Filled 2012-11-02 (×3): qty 1

## 2012-11-02 MED ORDER — FENTANYL CITRATE 0.05 MG/ML IJ SOLN
INTRAMUSCULAR | Status: DC | PRN
  Administered 2012-11-02 (×3): 50 ug via INTRAVENOUS
  Administered 2012-11-02: 100 ug via INTRAVENOUS

## 2012-11-02 MED ORDER — CEFAZOLIN SODIUM-DEXTROSE 2-3 GM-% IV SOLR
INTRAVENOUS | Status: AC
  Filled 2012-11-02: qty 50

## 2012-11-02 MED ORDER — IOHEXOL 300 MG/ML  SOLN
INTRAMUSCULAR | Status: AC
  Filled 2012-11-02: qty 1

## 2012-11-02 MED ORDER — PROMETHAZINE HCL 25 MG/ML IJ SOLN: 6.2500 mg | INTRAMUSCULAR | Status: DC | PRN

## 2012-11-02 MED ORDER — LACTATED RINGERS IV SOLN
INTRAVENOUS | Status: DC
  Administered 2012-11-02: 1000 mL via INTRAVENOUS

## 2012-11-02 MED ORDER — NEOSTIGMINE METHYLSULFATE 1 MG/ML IJ SOLN
INTRAMUSCULAR | Status: DC | PRN
  Administered 2012-11-02: 5 mg via INTRAVENOUS

## 2012-11-02 MED ORDER — BUPIVACAINE-EPINEPHRINE 0.25% -1:200000 IJ SOLN
INTRAMUSCULAR | Status: DC | PRN
  Administered 2012-11-02: 30 mL

## 2012-11-02 MED ORDER — HEPARIN SODIUM (PORCINE) 5000 UNIT/ML IJ SOLN
5000.0000 [IU] | Freq: Three times a day (TID) | INTRAMUSCULAR | Status: DC
  Administered 2012-11-02 – 2012-11-05 (×8): 5000 [IU] via SUBCUTANEOUS
  Filled 2012-11-02 (×12): qty 1

## 2012-11-02 MED ORDER — BUPIVACAINE-EPINEPHRINE 0.25% -1:200000 IJ SOLN
INTRAMUSCULAR | Status: AC
  Filled 2012-11-02: qty 1

## 2012-11-02 MED ORDER — ONDANSETRON HCL 4 MG/2ML IJ SOLN
INTRAMUSCULAR | Status: DC | PRN
  Administered 2012-11-02: 4 mg via INTRAVENOUS

## 2012-11-02 MED ORDER — LACTATED RINGERS IV SOLN
INTRAVENOUS | Status: DC | PRN
  Administered 2012-11-02: 1000 mL via INTRAVENOUS

## 2012-11-02 MED ORDER — ROCURONIUM BROMIDE 100 MG/10ML IV SOLN
INTRAVENOUS | Status: DC | PRN
  Administered 2012-11-02 (×2): 10 mg via INTRAVENOUS
  Administered 2012-11-02: 30 mg via INTRAVENOUS
  Administered 2012-11-02: 10 mg via INTRAVENOUS

## 2012-11-02 MED ORDER — PROPOFOL 10 MG/ML IV BOLUS
INTRAVENOUS | Status: DC | PRN
  Administered 2012-11-02: 200 mg via INTRAVENOUS

## 2012-11-02 MED ORDER — GLYCOPYRROLATE 0.2 MG/ML IJ SOLN
INTRAMUSCULAR | Status: DC | PRN
  Administered 2012-11-02: 0.6 mg via INTRAVENOUS

## 2012-11-02 MED ORDER — ONDANSETRON HCL 4 MG PO TABS: 4.0000 mg | ORAL_TABLET | Freq: Four times a day (QID) | ORAL | Status: DC | PRN

## 2012-11-02 MED ORDER — ONDANSETRON HCL 4 MG/2ML IJ SOLN
4.0000 mg | Freq: Four times a day (QID) | INTRAMUSCULAR | Status: DC | PRN
  Administered 2012-11-02 – 2012-11-03 (×3): 4 mg via INTRAVENOUS
  Filled 2012-11-02 (×3): qty 2

## 2012-11-02 MED ORDER — HYDROMORPHONE HCL PF 1 MG/ML IJ SOLN
INTRAMUSCULAR | Status: DC | PRN
  Administered 2012-11-02: 1 mg via INTRAVENOUS

## 2012-11-02 MED ORDER — EPHEDRINE SULFATE 50 MG/ML IJ SOLN
INTRAMUSCULAR | Status: DC | PRN
  Administered 2012-11-02: 15 mg via INTRAVENOUS
  Administered 2012-11-02 (×2): 5 mg via INTRAVENOUS
  Administered 2012-11-02: 10 mg via INTRAVENOUS

## 2012-11-02 MED ORDER — CEFAZOLIN SODIUM-DEXTROSE 2-3 GM-% IV SOLR
2.0000 g | INTRAVENOUS | Status: AC
  Administered 2012-11-02: 2 g via INTRAVENOUS

## 2012-11-02 MED ORDER — LACTATED RINGERS IV SOLN
INTRAVENOUS | Status: DC | PRN
  Administered 2012-11-02 (×2): via INTRAVENOUS

## 2012-11-02 MED ORDER — DEXTROSE 5 % IV SOLN
1.0000 g | Freq: Four times a day (QID) | INTRAVENOUS | Status: AC
  Administered 2012-11-02 – 2012-11-03 (×3): 1 g via INTRAVENOUS
  Filled 2012-11-02 (×3): qty 1

## 2012-11-02 MED ORDER — HYDROMORPHONE HCL PF 1 MG/ML IJ SOLN
0.2500 mg | INTRAMUSCULAR | Status: DC | PRN
  Administered 2012-11-02: 0.25 mg via INTRAVENOUS
  Administered 2012-11-02: 0.5 mg via INTRAVENOUS
  Administered 2012-11-02: 0.25 mg via INTRAVENOUS
  Administered 2012-11-02: 0.5 mg via INTRAVENOUS

## 2012-11-02 MED ORDER — MORPHINE SULFATE 2 MG/ML IJ SOLN
1.0000 mg | INTRAMUSCULAR | Status: DC | PRN
  Administered 2012-11-02 – 2012-11-04 (×9): 1 mg via INTRAVENOUS
  Filled 2012-11-02 (×9): qty 1

## 2012-11-02 MED ORDER — HEPARIN SODIUM (PORCINE) 5000 UNIT/ML IJ SOLN
5000.0000 [IU] | Freq: Once | INTRAMUSCULAR | Status: AC
  Administered 2012-11-02: 5000 [IU] via SUBCUTANEOUS
  Filled 2012-11-02: qty 1

## 2012-11-02 MED ORDER — SUCCINYLCHOLINE CHLORIDE 20 MG/ML IJ SOLN
INTRAMUSCULAR | Status: DC | PRN
  Administered 2012-11-02: 100 mg via INTRAVENOUS

## 2012-11-02 MED ORDER — LIDOCAINE HCL (CARDIAC) 20 MG/ML IV SOLN
INTRAVENOUS | Status: DC | PRN
  Administered 2012-11-02: 100 mg via INTRAVENOUS

## 2012-11-02 MED ORDER — MIDAZOLAM HCL 5 MG/5ML IJ SOLN
INTRAMUSCULAR | Status: DC | PRN
  Administered 2012-11-02: 2 mg via INTRAVENOUS

## 2012-11-02 SURGICAL SUPPLY — 53 items
ADH SKN CLS APL DERMABOND .7 (GAUZE/BANDAGES/DRESSINGS) ×1
APPLIER CLIP 5 13 M/L LIGAMAX5 (MISCELLANEOUS)
APPLIER CLIP ROT 10 11.4 M/L (STAPLE) ×4
APR CLP MED LRG 5 ANG JAW (MISCELLANEOUS)
BENZOIN TINCTURE PRP APPL 2/3 (GAUZE/BANDAGES/DRESSINGS) IMPLANT
CABLE HIGH FREQUENCY MONO STRZ (ELECTRODE) ×4 IMPLANT
CANISTER SUCTION 2500CC (MISCELLANEOUS) IMPLANT
CATH REDDICK CHOLANGI 4FR 50CM (CATHETERS) IMPLANT
CLIP APPLIE 5 13 M/L LIGAMAX5 (MISCELLANEOUS) IMPLANT
CLIP APPLIE ROT 10 11.4 M/L (STAPLE) ×3 IMPLANT
CLOTH BEACON ORANGE TIMEOUT ST (SAFETY) ×4 IMPLANT
COVER MAYO STAND STRL (DRAPES) ×4 IMPLANT
COVER SURGICAL LIGHT HANDLE (MISCELLANEOUS) IMPLANT
DECANTER SPIKE VIAL GLASS SM (MISCELLANEOUS) ×4 IMPLANT
DERMABOND ADVANCED (GAUZE/BANDAGES/DRESSINGS) ×1
DERMABOND ADVANCED .7 DNX12 (GAUZE/BANDAGES/DRESSINGS) ×3 IMPLANT
DRAIN CHANNEL 19F RND (DRAIN) ×4 IMPLANT
DRAPE C-ARM 42X120 X-RAY (DRAPES) ×4 IMPLANT
DRAPE LAPAROSCOPIC ABDOMINAL (DRAPES) ×4 IMPLANT
ELECT REM PT RETURN 9FT ADLT (ELECTROSURGICAL) ×4
ELECTRODE REM PT RTRN 9FT ADLT (ELECTROSURGICAL) ×3 IMPLANT
EVACUATOR DRAINAGE 7X20 100CC (MISCELLANEOUS) ×3 IMPLANT
EVACUATOR SILICONE 100CC (MISCELLANEOUS) ×2
GLOVE BIOGEL M 8.0 STRL (GLOVE) ×4 IMPLANT
GOWN STRL NON-REIN LRG LVL3 (GOWN DISPOSABLE) IMPLANT
GOWN STRL REIN XL XLG (GOWN DISPOSABLE) ×20 IMPLANT
HEMOSTAT SURGICEL 4X8 (HEMOSTASIS) IMPLANT
IV CATH 14GX2 1/4 (CATHETERS) ×4 IMPLANT
KIT BASIN OR (CUSTOM PROCEDURE TRAY) ×4 IMPLANT
MANIFOLD NEPTUNE II (INSTRUMENTS) ×4 IMPLANT
NEEDLE BIOPSY 14GX4.5 SOFT TIS (NEEDLE) ×4 IMPLANT
NS IRRIG 1000ML POUR BTL (IV SOLUTION) ×4 IMPLANT
POUCH SPECIMEN RETRIEVAL 10MM (ENDOMECHANICALS) ×4 IMPLANT
SET CHOLANGIOGRAPH MIX (MISCELLANEOUS) ×4 IMPLANT
SET IRRIG TUBING LAPAROSCOPIC (IRRIGATION / IRRIGATOR) ×4 IMPLANT
SLEEVE ENDOPATH XCEL 5M (ENDOMECHANICALS) ×4 IMPLANT
SLEEVE Z-THREAD 5X100MM (TROCAR) IMPLANT
SOLUTION ANTI FOG 6CC (MISCELLANEOUS) ×4 IMPLANT
SPONGE GAUZE 4X4 12PLY (GAUZE/BANDAGES/DRESSINGS) ×4 IMPLANT
STRIP CLOSURE SKIN 1/2X4 (GAUZE/BANDAGES/DRESSINGS) IMPLANT
SUT ETHILON 3 0 PS 1 (SUTURE) ×4 IMPLANT
SUT PDS AB 1 CTX 36 (SUTURE) ×12 IMPLANT
SUT VIC AB 4-0 SH 18 (SUTURE) ×4 IMPLANT
SYR 30ML LL (SYRINGE) ×4 IMPLANT
SYR BULB IRRIGATION 50ML (SYRINGE) ×4 IMPLANT
TAPE CLOTH SURG 4X10 WHT LF (GAUZE/BANDAGES/DRESSINGS) ×4 IMPLANT
TRAY LAP CHOLE (CUSTOM PROCEDURE TRAY) ×4 IMPLANT
TROCAR BLADELESS OPT 5 100 (ENDOMECHANICALS) IMPLANT
TROCAR BLADELESS OPT 5 75 (ENDOMECHANICALS) IMPLANT
TROCAR XCEL BLUNT TIP 100MML (ENDOMECHANICALS) ×4 IMPLANT
TROCAR XCEL NON-BLD 11X100MML (ENDOMECHANICALS) ×4 IMPLANT
TROCAR XCEL NON-BLD 5MMX100MML (ENDOMECHANICALS) ×4 IMPLANT
TUBING INSUFFLATION 10FT LAP (TUBING) ×4 IMPLANT

## 2012-11-02 NOTE — H&P (View-Only) (Signed)
Chief Complaint:  Gallbladder wall thickening and recurrent attacks of upper abdominal pain  History of Present Illness:  Sara [name] is an 67 y.o. female on whom I performed a large type III hiatal hernia repair in 2011. She has had some bouts of abdominal pain ultrasound has gallbladder wall thickening and evidence of chronic cholecystitis. I have discussed laparoscopic cholecystectomy with her and she was going proceed to have that repaired. She had an upper GI series that suggested that she might have some delayed gastric emptying. There is also question of some distal gastric ulcers. I think that at the time of her laparoscopic cholecystectomy on her perform an upper endoscopy. We'll go ahead and schedule the [place] at her convenience.  Past Medical History  Diagnosis Date  . HIATAL HERNIA     s/p surgical repair  . HYPERTENSION   . Kidney stone     1990s    Past Surgical History  Procedure Laterality Date  . Tonsillectomy  1968  . Cataract extraction  2009  . Hernia repair  2011  . Lithotripsy    . Vaginal hysterectomy N/A 06/12/2012    Procedure: HYSTERECTOMY VAGINAL;  Surgeon: Vaishali R Mody, MD;  Location: [facility];  Service: Gynecology;  Laterality: N/A;  . Cystocele repair N/A 06/12/2012    Procedure: ANTERIOR REPAIR (CYSTOCELE);  Surgeon: Vaishali R Mody, MD;  Location: [facility];  Service: Gynecology;  Laterality: N/A;    Current Outpatient Prescriptions  Medication Sig Dispense Refill  . amLODipine (NORVASC) 5 MG tablet Take 1 tablet (5 mg total) by mouth daily.  30 tablet  11  . Cholecalciferol 1000 UNITS tablet Take 3,000 Units by mouth daily.      . estradiol (ESTRACE) 0.1 MG/GM vaginal cream Place 0.5 g vaginally 2 (two) times a week.      . famotidine (PEPCID) 20 MG tablet Take 1 tablet (20 mg total) by mouth 2 (two) times daily.  30 tablet  0  . Multiple Vitamin (MULTIVITAMIN WITH MINERALS) TABS Take 1 tablet by mouth daily.       No current  facility-administered medications for this visit.   Review of patient's allergies indicates no known allergies. Family History  Problem Relation Age of Onset  . Cancer Mother   . Hypertension Other     Parent  . Breast cancer Other   . Heart disease Other     parent, grandparent   Social History:   reports that she has never smoked. She has never used smokeless tobacco. She reports that she does not drink alcohol or use illicit drugs.   REVIEW OF SYSTEMS - PERTINENT POSITIVES ONLY: No complaints of GERD   Physical Exam:   Blood pressure 150/78, pulse 84, resp. rate 16, height 5' 8" (1.727 m), weight 196 lb (88.905 kg). Body mass index is 29.81 kg/(m^2).  Gen:  WDWN WF NAD  Neurological: Alert and oriented to person, place, and time. Motor and sensory function is grossly intact  Head: Normocephalic and atraumatic.  Eyes: Conjunctivae are normal. Pupils are equal, round, and reactive to light. No scleral icterus.  Neck: Normal range of motion. Neck supple. No tracheal deviation or thyromegaly present.  Cardiovascular:  SR without murmurs or gallops.  No carotid bruits Respiratory: Effort normal.  No respiratory distress. No chest wall tenderness. Breath sounds normal.  No wheezes, rales or rhonchi.  Abdomen:  nontender GU: Musculoskeletal: Normal range of motion. Extremities are nontender. No cyanosis, edema or clubbing noted Lymphadenopathy: No   cervical, preauricular, postauricular or axillary adenopathy is present Skin: Skin is warm and dry. No rash noted. No diaphoresis. No erythema. No pallor. Pscyh: Normal mood and affect. Behavior is normal. Judgment and thought content normal.   LABORATORY RESULTS: No results found for this or any previous visit (from the past 48 hour(s)).  RADIOLOGY RESULTS: No results found.  Problem List: Patient Active Problem List   Diagnosis Date Noted  . Chronic cholecystitis 10/20/2012  . S/P vaginal hysterectomy 06/13/2012  . DEPRESSION,  SITUATIONAL 02/20/2009  . GERD 02/20/2009  . RENAL CALCULUS, HX OF 02/20/2009  . CHICKENPOX, HX OF 02/20/2009  . HYPERTENSION 02/14/2009  . HIATAL HERNIA 02/14/2009  . SYNCOPE 02/05/2009    Assessment & Plan: Chronic cholecystitis;  UGI suggesting distal gastritis  Lap chole and upper endoscopy at [facility]    Matt B. Lavontay Kirk, MD, FACS  [facility] [phone] beeper [phone]  10/20/2012 3:03 PM     

## 2012-11-02 NOTE — Interval H&P Note (Signed)
History and Physical Interval Note:  11/02/2012 9:06 AM  Rosemary Holms  has presented today for surgery, with the diagnosis of gallstones  The various methods of treatment have been discussed with the patient and family. After consideration of risks, benefits and other options for treatment, the patient has consented to  Procedure(s): LAPAROSCOPIC CHOLECYSTECTOMY WITH INTRA OP ENDOSCOPY (N/A) as a surgical intervention .  The patient's history has been reviewed, patient examined, no change in status, stable for surgery.  I have reviewed the patient's chart and labs.  Questions were answered to the patient's satisfaction.     Najmah Carradine B

## 2012-11-02 NOTE — Preoperative (Signed)
Beta Blockers   Reason not to administer Beta Blockers:Not Applicable 

## 2012-11-02 NOTE — Brief Op Note (Signed)
11/02/2012  1:05 PM  PATIENT:  Rosemary Holms  67 y.o. female  PRE-OPERATIVE DIAGNOSIS:  gallstones  POST-OPERATIVE DIAGNOSIS:  gallstones  PROCEDURE:  Procedure(s): CHOLECYSTECTOMY UPPER GI ENDOSCOPY  SURGEON:  Surgeon(s) and Role:    * Valarie Merino, MD - Primary    * Kandis Cocking, MD - Assisting  PHYSICIAN ASSISTANT:   ASSISTANTS: Ovidio Kin, MD, FACS   ANESTHESIA:   general  EBL:  Total I/O In: 1500 [I.V.:1500] Out: 25 [Blood:25]  BLOOD ADMINISTERED:none  DRAINS: (19) Jackson-Pratt drain(s) with closed bulb suction in the GB fossae   LOCAL MEDICATIONS USED:  MARCAINE     SPECIMEN:  Excision  DISPOSITION OF SPECIMEN:  PATHOLOGY  COUNTS:  YES  TOURNIQUET:  * No tourniquets in log *  DICTATION: .Other Dictation: Dictation Number [id number]  PLAN OF CARE: Admit to inpatient   PATIENT DISPOSITION:  PACU - hemodynamically stable.   Delay start of Pharmacological VTE agent (>24hrs) due to surgical blood loss or risk of bleeding: no

## 2012-11-02 NOTE — Anesthesia Preprocedure Evaluation (Addendum)
Anesthesia Evaluation  Patient identified by MRN, date of birth, ID band Patient awake    Reviewed: Allergy & Precautions, H&P , NPO status , Patient's Chart, lab work & pertinent test results  Airway Mallampati: II TM Distance: >3 FB Neck ROM: Full    Dental no notable dental hx.    Pulmonary neg pulmonary ROS,  breath sounds clear to auscultation  Pulmonary exam normal       Cardiovascular Exercise Tolerance: Good hypertension, Pt. on medications negative cardio ROS  Rhythm:Regular Rate:Normal     Neuro/Psych PSYCHIATRIC DISORDERS  Neuromuscular disease    GI/Hepatic Neg liver ROS, GERD-  Medicated,  Endo/Other  negative endocrine ROS  Renal/GU Renal disease  negative genitourinary   Musculoskeletal negative musculoskeletal ROS (+)   Abdominal   Peds negative pediatric ROS (+)  Hematology negative hematology ROS (+)   Anesthesia Other Findings   Reproductive/Obstetrics negative OB ROS                           Anesthesia Physical Anesthesia Plan  ASA: II  Anesthesia Plan: General   Post-op Pain Management:    Induction: Intravenous  Airway Management Planned: Oral ETT  Additional Equipment:   Intra-op Plan:   Post-operative Plan: Extubation in OR  Informed Consent: I have reviewed the patients History and Physical, chart, labs and discussed the procedure including the risks, benefits and alternatives for the proposed anesthesia with the patient or authorized representative who has indicated his/her understanding and acceptance.   Dental advisory given  Plan Discussed with: CRNA  Anesthesia Plan Comments:         Anesthesia Quick Evaluation

## 2012-11-02 NOTE — Anesthesia Postprocedure Evaluation (Signed)
  Anesthesia Post-op Note  Patient: Sara [name]  Procedure(s) Performed: Procedure(s): CHOLECYSTECTOMY UPPER GI ENDOSCOPY  Patient Location: PACU  Anesthesia Type: General  Level of Consciousness: awake and alert   Airway and Oxygen Therapy: Patient Spontanous Breathing  Post-op Pain: mild  Post-op Assessment: Post-op Vital signs reviewed, Patient's Cardiovascular Status Stable, Respiratory Function Stable, Patent Airway and No signs of Nausea or vomiting  Last Vitals:  Filed Vitals:   11/02/12 1430  BP: 107/70  Pulse: 103  Temp: 36.3 C  Resp: 20    Post-op Vital Signs: stable   Complications: No apparent anesthesia complications

## 2012-11-02 NOTE — Transfer of Care (Signed)
Immediate Anesthesia Transfer of Care Note  Patient: Sara [name]  Procedure(s) Performed: Procedure(s): CHOLECYSTECTOMY UPPER GI ENDOSCOPY  Patient Location: PACU  Anesthesia Type:General  Level of Consciousness: sedated, patient cooperative and responds to stimulation  Airway & Oxygen Therapy: Patient Spontanous Breathing and Patient connected to face mask oxygen  Post-op Assessment: Report given to PACU RN and Post -op Vital signs reviewed and stable  Post vital signs: Reviewed and stable  Complications: No apparent anesthesia complications

## 2012-11-03 ENCOUNTER — Encounter (HOSPITAL_COMMUNITY): Payer: Self-pay | Admitting: Surgery

## 2012-11-03 LAB — BASIC METABOLIC PANEL
CO2: 27 mEq/L (ref 19–32)
Calcium: 9.1 mg/dL (ref 8.4–10.5)
Chloride: 100 mEq/L (ref 96–112)
Creatinine, Ser: 0.94 mg/dL (ref 0.50–1.10)
GFR calc Af Amer: 72 mL/min — ABNORMAL LOW (ref >90)
Sodium: 134 mEq/L — ABNORMAL LOW (ref 135–145)

## 2012-11-03 LAB — CBC
MCV: 85.4 fL (ref 78.0–100.0)
Platelets: 252 10*3/uL (ref 150–400)
RBC: 4.26 MIL/uL (ref 3.87–5.11)
RDW: 15.5 % (ref 11.5–15.5)
WBC: 15.9 10*3/uL — ABNORMAL HIGH (ref 4.0–10.5)

## 2012-11-03 NOTE — Op Note (Signed)
NAMEJOHANY, HANSMAN               ACCOUNT NO.:  [id number]  MEDICAL RECORD NO.:  [id number]  LOCATION:  1524                         FACILITY:  [facility]  PHYSICIAN:  Thornton Park. Daphine Deutscher, MD  DATE OF BIRTH:  17-Dec-1945  DATE OF PROCEDURE:  11/02/2012 DATE OF DISCHARGE:                              OPERATIVE REPORT   PREOPERATIVE DIAGNOSIS:  Gallstones with irregularities on upper gastrointestinal series.  POSTOPERATIVE DIAGNOSIS:  Severe subacute and chronic cholecystitis with early cholecystoduodenal fistula formation.  PROCEDURE:  Laparoscopic converted to open cholecystectomy with upper endoscopy and placement of a drain.  FINDINGS:  Hard, fixed, puckered, scarred-down gallbladder that apparently had an abscess and had been walled off by the duodenum and omentum.  SURGEON:  Thornton Park. Daphine Deutscher, MD  ASSISTANT:  Dr. Ezzard Standing  ANESTHESIA:  General endotracheal.  DESCRIPTION OF PROCEDURE:  The patient was taken to room 1, given general anesthesia.  The abdomen was prepped with PCMX and draped sterilely.  After a time-out, I entered the abdomen through the right upper quadrant using a 5-mm 0-degree Optiview without difficulty.  The abdomen was insufflated, and at first glance looked unremarkable except for the liver appeared puckered where the gallbladder was, and there was omentum walling that area off.  I surveyed and saw I had performed a previous Nissen fundoplication on her and without having problems with reflux, she was doing well, but I do see some evidence that there is a hiatus opening visible.  The pyloric channel and duodenum were stuck over to the gallbladder as I began exposing this.  We had a total of 4 trocars in place, and I began by taking down the omentum using sharp dissection, cutting it off this hard fibrotic mass.  Initially, I was thinking this might be a gallbladder cancer.  We were unable to grasp it but held it and pulled it up gently to the point where the  duodenum was fused to the infundibulum, and at that point, I opened the patient.  Dr. Ezzard Standing endoscoped the patient while I was concomitantly dissecting this area, as I suspected there was a fistula.  We were able to take the gallbladder wall and leave it on the duodenum but saw no fistula in the end.  We elected to leave that in place after cauterizing the mucosa of gallbladder tissue that was left on the duodenum.  Meanwhile, I took off and amputated the top portion of this gallbladder and sent it for frozen section which showed severe chronic fibrosis and inflammation.  I then pulled out several well-formed 1 cm cholesterol stones.  I never got any bile refluxing back up from the infundibulum cystic duct region which is likely there are small stones that get impacted in that region.  I elected to remove the anterior portion of the gallbladder but did not try to get down near the common duct to maintain that as it was well walled off and to keep out of harm's way.  I elected to put a 19 Blake drain in this gallbladder bed which I also cauterized with the electrocautery again to ablate the mucosa.  This was secured, and it was brought out through a lateral port  on the right.  Prior to closing, we also re-endoscoped the patient, inflated, and I looked for a evidence of a leak, but no bubbles were seen.  The posterior sheath was then closed with running #1 PDS.  The anterior sheath was closed with running #1 PDS and the fascia was injected with 0.25% Marcaine with epinephrine. Wound was irrigated and closed with staples.  The patient tolerated the procedure well and was taken to the recovery room in satisfactory condition.     Thornton Park Daphine Deutscher, MD     MBM/MEDQ  D:  11/02/2012  T:  11/03/2012  Job:  [id number]

## 2012-11-04 LAB — CBC
HCT: 36.2 % (ref 36.0–46.0)
MCV: 87.4 fL (ref 78.0–100.0)
Platelets: 221 10*3/uL (ref 150–400)
RBC: 4.14 MIL/uL (ref 3.87–5.11)
RDW: 16.1 % — ABNORMAL HIGH (ref 11.5–15.5)
WBC: 9.8 10*3/uL (ref 4.0–10.5)

## 2012-11-04 LAB — BASIC METABOLIC PANEL
CO2: 31 mEq/L (ref 19–32)
Chloride: 103 mEq/L (ref 96–112)
GFR calc Af Amer: 60 mL/min — ABNORMAL LOW (ref >90)
Potassium: 3.9 mEq/L (ref 3.5–5.1)

## 2012-11-04 NOTE — Progress Notes (Signed)
Patient ID: Sara [name], female   DOB: [birth year], 67 y.o.   MRN: [id number] [facility] Surgery Progress Note:   2 Days Post-Op  Subjective: Mental status is clear.  Had some nausea last night with attempts to advance diet Objective: Vital signs in last 24 hours: Temp:  [97.9 F (36.6 C)-99.2 F (37.3 C)] 97.9 F (36.6 C) (08/13 0610) Pulse Rate:  [69-84] 84 (08/13 0610) Resp:  [16-20] 18 (08/13 0610) BP: (110-136)/(67-79) 124/77 mmHg (08/13 0610) SpO2:  [93 %-99 %] 98 % (08/13 0610)  Intake/Output from previous day: 08/12 0701 - 08/13 0700 In: 780 [P.O.:780] Out: 3940 [Urine:3850; Drains:90] Intake/Output this shift:    Physical Exam: Work of breathing is normal.  JP is serosanguinous  Lab Results:  Results for orders placed during the hospital encounter of 11/02/12 (from the past 48 hour(s))  CBC     Status: Abnormal   Collection Time    11/02/12  2:22 PM      Result Value Range   WBC 15.9 (*) 4.0 - 10.5 K/uL   RBC 4.47  3.87 - 5.11 MIL/uL   Hemoglobin 12.7  12.0 - 15.0 g/dL   HCT 40.9  81.1 - 91.4 %   MCV 85.2  78.0 - 100.0 fL   MCH 28.4  26.0 - 34.0 pg   MCHC 33.3  30.0 - 36.0 g/dL   RDW 78.2  95.6 - 21.3 %   Platelets 221  150 - 400 K/uL  CREATININE, SERUM     Status: Abnormal   Collection Time    11/02/12  2:22 PM      Result Value Range   Creatinine, Ser 0.99  0.50 - 1.10 mg/dL   GFR calc non Af Amer 59 (*) >90 mL/min   GFR calc Af Amer 68 (*) >90 mL/min   Comment:            The eGFR has been calculated     using the CKD EPI equation.     This calculation has not been     validated in all clinical     situations.     eGFR's persistently     <90 mL/min signify     possible Chronic Kidney Disease.  CBC     Status: Abnormal   Collection Time    11/03/12  3:42 AM      Result Value Range   WBC 15.9 (*) 4.0 - 10.5 K/uL   RBC 4.26  3.87 - 5.11 MIL/uL   Hemoglobin 11.9 (*) 12.0 - 15.0 g/dL   HCT 08.6  57.8 - 46.9 %   MCV 85.4  78.0 - 100.0 fL    MCH 27.9  26.0 - 34.0 pg   MCHC 32.7  30.0 - 36.0 g/dL   RDW 62.9  52.8 - 41.3 %   Platelets 252  150 - 400 K/uL  BASIC METABOLIC PANEL     Status: Abnormal   Collection Time    11/03/12  3:42 AM      Result Value Range   Sodium 134 (*) 135 - 145 mEq/L   Potassium 4.1  3.5 - 5.1 mEq/L   Chloride 100  96 - 112 mEq/L   CO2 27  19 - 32 mEq/L   Glucose, Bld 129 (*) 70 - 99 mg/dL   BUN 15  6 - 23 mg/dL   Creatinine, Ser 2.44  0.50 - 1.10 mg/dL   Calcium 9.1  8.4 - 01.0 mg/dL   GFR calc  non Af Amer 62 (*) >90 mL/min   GFR calc Af Amer 72 (*) >90 mL/min   Comment:            The eGFR has been calculated     using the CKD EPI equation.     This calculation has not been     validated in all clinical     situations.     eGFR's persistently     <90 mL/min signify     possible Chronic Kidney Disease.  CBC     Status: Abnormal   Collection Time    11/04/12  3:50 AM      Result Value Range   WBC 9.8  4.0 - 10.5 K/uL   RBC 4.14  3.87 - 5.11 MIL/uL   Hemoglobin 11.8 (*) 12.0 - 15.0 g/dL   HCT 16.1  09.6 - 04.5 %   MCV 87.4  78.0 - 100.0 fL   MCH 28.5  26.0 - 34.0 pg   MCHC 32.6  30.0 - 36.0 g/dL   RDW 40.9 (*) 81.1 - 91.4 %   Platelets 221  150 - 400 K/uL  BASIC METABOLIC PANEL     Status: Abnormal   Collection Time    11/04/12  3:50 AM      Result Value Range   Sodium 140  135 - 145 mEq/L   Potassium 3.9  3.5 - 5.1 mEq/L   Chloride 103  96 - 112 mEq/L   CO2 31  19 - 32 mEq/L   Glucose, Bld 94  70 - 99 mg/dL   BUN 14  6 - 23 mg/dL   Creatinine, Ser 7.82  0.50 - 1.10 mg/dL   Calcium 9.4  8.4 - 95.6 mg/dL   GFR calc non Af Amer 52 (*) >90 mL/min   GFR calc Af Amer 60 (*) >90 mL/min   Comment:            The eGFR has been calculated     using the CKD EPI equation.     This calculation has not been     validated in all clinical     situations.     eGFR's persistently     <90 mL/min signify     possible Chronic Kidney Disease.    Radiology/Results: No results  found.  Anti-infectives: Anti-infectives   Start     Dose/Rate Route Frequency Ordered Stop   11/02/12 1600  cefOXitin (MEFOXIN) 1 g in dextrose 5 % 50 mL IVPB     1 g 100 mL/hr over 30 Minutes Intravenous Every 6 hours 11/02/12 1350 11/03/12 0419   11/02/12 0715  ceFAZolin (ANCEF) IVPB 2 g/50 mL premix     2 g 100 mL/hr over 30 Minutes Intravenous On call to O.R. 11/02/12 0701 11/02/12 0945      Assessment/Plan: Problem List: Patient Active Problem List   Diagnosis Date Noted  . Chronic cholecystitis 10/20/2012  . S/P vaginal hysterectomy 06/13/2012  . DEPRESSION, SITUATIONAL 02/20/2009  . GERD 02/20/2009  . RENAL CALCULUS, HX OF 02/20/2009  . CHICKENPOX, HX OF 02/20/2009  . HYPERTENSION 02/14/2009  . HIATAL HERNIA 02/14/2009  . SYNCOPE 02/05/2009    Not ready for discharge today.  Will hopefully discharge tomorrow 2 Days Post-Op    LOS: 2 days   Matt B. Daphine Deutscher, MD, [facility] [phone] beeper [phone]  11/04/2012 8:35 AM

## 2012-11-05 DIAGNOSIS — Z9049 Acquired absence of other specified parts of digestive tract: Secondary | ICD-10-CM

## 2012-11-05 LAB — CBC WITH DIFFERENTIAL/PLATELET
Basophils Absolute: 0 10*3/uL (ref 0.0–0.1)
Eosinophils Relative: 3 % (ref 0–5)
Lymphocytes Relative: 25 % (ref 12–46)
MCV: 86.7 fL (ref 78.0–100.0)
Neutro Abs: 5.4 10*3/uL (ref 1.7–7.7)
Neutrophils Relative %: 63 % (ref 43–77)
Platelets: 236 10*3/uL (ref 150–400)
RDW: 15.7 % — ABNORMAL HIGH (ref 11.5–15.5)
WBC: 8.6 10*3/uL (ref 4.0–10.5)

## 2012-11-05 LAB — COMPREHENSIVE METABOLIC PANEL
ALT: 11 U/L (ref 0–35)
AST: 15 U/L (ref 0–37)
CO2: 30 mEq/L (ref 19–32)
Calcium: 9.3 mg/dL (ref 8.4–10.5)
GFR calc non Af Amer: 56 mL/min — ABNORMAL LOW (ref >90)
Potassium: 3.8 mEq/L (ref 3.5–5.1)
Sodium: 138 mEq/L (ref 135–145)
Total Protein: 6.5 g/dL (ref 6.0–8.3)

## 2012-11-05 NOTE — Discharge Summary (Signed)
Physician Discharge Summary  Patient ID: Sara [name] MRN: [id number] DOB/AGE: Jul 27, 1945 67 y.o.  Admit date: 11/02/2012 Discharge date: 11/05/2012  Admission Diagnoses:  cholecystitis  Discharge Diagnoses:  Severe cholecystitis with marked adhesions to duodenum  Principal Problem:   S/P laparoscopic cholecystectomy-for advanced necrotic gallbladder August 2014   Surgery:  Lap/open cholecystectomy with placement of drain  Discharged Condition: improved  Hospital Course:   Had surgery; JP left in place because cystic duct could not be isolated.    Consults: none  Significant Diagnostic Studies: IOC    Discharge Exam: Blood pressure 116/68, pulse 62, temperature 98.4 F (36.9 C), temperature source Oral, resp. rate 16, height 5\' 8"  (1.727 m), weight 196 lb 12.5 oz (89.26 kg), SpO2 98.00%. JP with serosanguinous drainage  Disposition: 01-Home or Self Care  Discharge Orders   Future Appointments Provider Department Dept Phone   11/25/2012 3:20 PM Valarie Merino, MD [facility], [place] [phone]   Future Orders Complete By Expires   Diet - low sodium heart healthy  As directed    Discharge instructions  As directed    Comments:     May shower ad lib. Recharge JP as necessary Make "nurse only appt" at [facility] for Mon/Tues for staple removal   Discharge wound care:  As directed    Comments:     Staples out next week May leave incision undressed and apply neosporin ointment until staples out   Increase activity slowly  As directed        Medication List         amLODipine 5 MG tablet  Commonly known as:  NORVASC  Take 5 mg by mouth every morning.     calcium-vitamin D 500-200 MG-UNIT per tablet  Take 1 tablet by mouth 2 (two) times daily with a meal.     Cholecalciferol 1000 UNITS tablet  Take 1,000 Units by mouth daily.     estradiol 0.1 MG/GM vaginal cream  Commonly known as:  ESTRACE  Place 0.5 g vaginally 2 (two) times a week. Tuesday and  Thursday     multivitamin with minerals Tabs tablet  Take 1 tablet by mouth daily.           Follow-up Information   Call Valarie Merino, MD. (nurse only next [facility])    Specialty:  General Surgery   Contact information:   [address] [place] [address] [phone]       Signed: Valarie Merino 11/05/2012, 8:16 AM

## 2012-11-10 ENCOUNTER — Ambulatory Visit (INDEPENDENT_AMBULATORY_CARE_PROVIDER_SITE_OTHER): Payer: PRIVATE HEALTH INSURANCE

## 2012-11-10 ENCOUNTER — Encounter (INDEPENDENT_AMBULATORY_CARE_PROVIDER_SITE_OTHER): Payer: Self-pay

## 2012-11-10 VITALS — BP 140/88 | HR 68 | Temp 97.8°F | Resp 16 | Ht 68.0 in | Wt 187.8 lb

## 2012-11-10 DIAGNOSIS — Z4802 Encounter for removal of sutures: Secondary | ICD-10-CM

## 2012-11-10 DIAGNOSIS — Z4803 Encounter for change or removal of drains: Secondary | ICD-10-CM

## 2012-11-10 DIAGNOSIS — Z4889 Encounter for other specified surgical aftercare: Secondary | ICD-10-CM

## 2012-11-10 NOTE — Progress Notes (Signed)
Patient comes into office today for staple removal and possible drain removal.  Patient s/p Cholecystectomy w/IOC and drain placement on 11/02/12.  Patient incisions appear to be healing well without any redness, drainage or swelling.  Proceeded to remove staples.  Patient incision intact, placed steri-strips over 3 different intact abdominal incisions.  Patient tolerated staple removal well.  Patient reports that her drain output has been less than 10 cc's over the last 72 hours.  Proceeded to remove suture and then proceeded to removed JP drain.  Placed dry gauze over drain site.  Patient tolerated well.  Patient was verbally given instructions that she may shower, pat area dry and if she has any drainage from drain site to place band aid or dry gauze over the area.  Patient advised that the steri-strips that were placed may peel off and this is to be expected.  Patient was also instructed that if she develops a fever, redness, swelling or an increase in drainage to contact our office for further assessment.  Patient is scheduled to return for post op appointment on 11/25/12 @ 3:20 pm w/Dr. Daphine Deutscher. Patient verbalized understanding.

## 2012-11-25 ENCOUNTER — Ambulatory Visit (INDEPENDENT_AMBULATORY_CARE_PROVIDER_SITE_OTHER): Payer: PRIVATE HEALTH INSURANCE | Admitting: Surgery

## 2012-11-25 ENCOUNTER — Encounter (INDEPENDENT_AMBULATORY_CARE_PROVIDER_SITE_OTHER): Payer: Self-pay | Admitting: Surgery

## 2012-11-25 VITALS — BP 138/82 | HR 60 | Resp 16 | Ht 68.0 in | Wt 188.8 lb

## 2012-11-25 DIAGNOSIS — Z9049 Acquired absence of other specified parts of digestive tract: Secondary | ICD-10-CM

## 2012-11-25 NOTE — Patient Instructions (Addendum)
Cholecystitis Cholecystitis is an inflammation of your gallbladder. It is usually caused by a buildup of gallstones or sludge (cholelithiasis) in your gallbladder. The gallbladder stores a fluid that helps digest fats (bile). Cholecystitis is serious and needs treatment right away.  CAUSES   Gallstones. Gallstones can block the tube that leads to your gallbladder, causing bile to build up. As bile builds up, the gallbladder becomes inflamed.  Bile duct problems, such as blockage from scarring or kinking.  Tumors. Tumors can stop bile from leaving your gallbladder correctly, causing bile to build up. As bile builds up, the gallbladder becomes inflamed. SYMPTOMS   Nausea.  Vomiting.  Abdominal pain, especially in the upper right area of your abdomen.  Abdominal tenderness or bloating.  Sweating.  Chills.  Fever.  Yellowing of the skin and the whites of the eyes (jaundice). DIAGNOSIS  Your caregiver may order blood tests to look for infection or gallbladder problems. Your caregiver may also order imaging tests, such as an ultrasound or computed tomography (CT) scan. Further tests may include a hepatobiliary iminodiacetic acid (HIDA) scan. This scan allows your caregiver to see your bile move from the liver to the gallbladder and to the small intestine. TREATMENT  A hospital stay is usually necessary to lessen the inflammation of your gallbladder. You may be required to not eat or drink (fast) for a certain amount of time. You may be given medicine to treat pain or an antibiotic medicine to treat an infection. Surgery may be needed to remove your gallbladder (cholecystectomy) once the inflammation has gone down. Surgery may be needed right away if you develop complications such as death of gallbladder tissue (gangrene) or a tear (perforation) of the gallbladder.  HOME CARE INSTRUCTIONS  Home care will depend on your treatment. In general:  If you were given antibiotics, take them as  directed. Finish them even if you start to feel better.  Only take over-the-counter or prescription medicines for pain, discomfort, or fever as directed by your caregiver.  Follow a low-fat diet until you see your caregiver again.  Keep all follow-up visits as directed by your caregiver. SEEK IMMEDIATE MEDICAL CARE IF:   Your pain is increasing and not controlled by medicines.  Your pain moves to another part of your abdomen or to your back.  You have a fever.  You have nausea and vomiting. MAKE SURE YOU:  Understand these instructions.  Will watch your condition.  Will get help right away if you are not doing well or get worse. Document Released: 03/11/2005 Document Revised: 06/03/2011 Document Reviewed: 01/25/2011 [employer] Patient Information 2014 [employer].  

## 2012-11-25 NOTE — Progress Notes (Signed)
Sara [name] 67 y.o.  Body mass index is 28.71 kg/(m^2).  Patient Active Problem List   Diagnosis Date Noted  . S/P laparoscopic cholecystectomy-for advanced necrotic gallbladder August 2014 11/05/2012  . S/P vaginal hysterectomy 06/13/2012  . DEPRESSION, SITUATIONAL 02/20/2009  . GERD 02/20/2009  . RENAL CALCULUS, HX OF 02/20/2009  . CHICKENPOX, HX OF 02/20/2009  . HYPERTENSION 02/14/2009  . HIATAL HERNIA 02/14/2009  . SYNCOPE 02/05/2009    No Known Allergies  Past Surgical History  Procedure Laterality Date  . Tonsillectomy  1968  . Cataract extraction  2009  . Hernia repair  2011  . Lithotripsy    . Vaginal hysterectomy N/A 06/12/2012    Procedure: HYSTERECTOMY VAGINAL;  Surgeon: Robley Fries, MD;  Location: [facility];  Service: Gynecology;  Laterality: N/A;  . Cystocele repair N/A 06/12/2012    Procedure: ANTERIOR REPAIR (CYSTOCELE);  Surgeon: Robley Fries, MD;  Location: [facility];  Service: Gynecology;  Laterality: N/A;  . Hiatal hernia repair    . Abdominal hysterectomy    . Cholecystectomy  11/02/2012    Procedure: CHOLECYSTECTOMY;  Surgeon: Valarie Merino, MD;  Location: [facility];  Service: General;;  . Upper gi endoscopy  11/02/2012    Procedure: UPPER GI ENDOSCOPY;  Surgeon: Valarie Merino, MD;  Location: [facility];  Service: General;;   Rene Paci, MD No diagnosis found.  Doing very well after open cholecystectomy for severely fibrotic GB with attempted fistula formation.  JP was removed in the office.   I think that she is doing very well.  Will see again PRN.   Matt B. Daphine Deutscher, MD, [facility] [phone] beeper [phone]  11/25/2012 4:55 PM

## 2013-01-28 ENCOUNTER — Other Ambulatory Visit: Payer: Self-pay

## 2013-06-02 ENCOUNTER — Encounter: Payer: Self-pay | Admitting: Physician Assistant

## 2013-06-02 ENCOUNTER — Other Ambulatory Visit: Payer: Medicare Other

## 2013-06-02 ENCOUNTER — Ambulatory Visit (INDEPENDENT_AMBULATORY_CARE_PROVIDER_SITE_OTHER): Payer: Medicare Other | Admitting: Physician Assistant

## 2013-06-02 VITALS — BP 142/80 | HR 82 | Temp 98.4°F | Ht 66.0 in | Wt 195.0 lb

## 2013-06-02 DIAGNOSIS — R300 Dysuria: Secondary | ICD-10-CM

## 2013-06-02 DIAGNOSIS — N390 Urinary tract infection, site not specified: Secondary | ICD-10-CM

## 2013-06-02 DIAGNOSIS — R309 Painful micturition, unspecified: Secondary | ICD-10-CM

## 2013-06-02 LAB — POCT URINALYSIS DIPSTICK
Bilirubin, UA: NEGATIVE
Glucose, UA: NEGATIVE
KETONES UA: NEGATIVE
NITRITE UA: NEGATIVE
PH UA: 5
PROTEIN UA: NEGATIVE
Spec Grav, UA: 1.015
UROBILINOGEN UA: 0.2

## 2013-06-02 MED ORDER — NITROFURANTOIN MONOHYD MACRO 100 MG PO CAPS: 100.0000 mg | ORAL_CAPSULE | Freq: Two times a day (BID) | ORAL | Status: DC

## 2013-06-02 MED ORDER — AMLODIPINE BESYLATE 5 MG PO TABS: 5.0000 mg | ORAL_TABLET | Freq: Every morning | ORAL | Status: DC

## 2013-06-02 NOTE — Progress Notes (Signed)
   Subjective:    Patient ID: Sara [name], female    DOB: [birth year], 68 y.o.   MRN: [id number]  Urinary Tract Infection  This is a new problem. The current episode started in the past 7 days. The problem occurs intermittently. The problem has been unchanged. The quality of the pain is described as aching and burning. The pain is mild. There has been no fever. Associated symptoms include frequency. Pertinent negatives include no chills, discharge, flank pain, hematuria, hesitancy, nausea, sweats, urgency or vomiting. She has tried increased fluids for the symptoms. The treatment provided no relief. bladder tack procedure nearly one year ago, had frequent UTI's prior to procedure    Review of Systems  Constitutional: Negative for chills.  Gastrointestinal: Negative for nausea and vomiting.  Genitourinary: Positive for frequency. Negative for hesitancy, urgency, hematuria and flank pain.       Urine is cloudy   Past Medical History  Diagnosis Date  . HIATAL HERNIA     s/p surgical repair  . HYPERTENSION   . Kidney stone     1990s  . GERD (gastroesophageal reflux disease)     hx of        Objective:   Physical Exam  Vitals reviewed. Constitutional: She is oriented to person, place, and time. She appears well-developed and well-nourished. No distress.  HENT:  Head: Normocephalic and atraumatic.  Eyes: Conjunctivae are normal.  Neck: Normal range of motion.  Cardiovascular: Normal rate and regular rhythm.  Exam reveals no gallop and no friction rub.   No murmur heard. Pulmonary/Chest: Effort normal and breath sounds normal. She has no wheezes. She has no rales.  Abdominal: Soft. Normal appearance and bowel sounds are normal. There is no tenderness. There is no rigidity, no rebound, no guarding and no CVA tenderness.  Musculoskeletal: Normal range of motion.  Neurological: She is alert and oriented to person, place, and time.  Skin: Skin is warm and dry. She is not  diaphoretic.  Psychiatric: She has a normal mood and affect.    Results for orders placed in visit on 06/02/13  POCT URINALYSIS DIPSTICK      Result Value Ref Range   Color, UA light yellow     Clarity, UA cloudy     Glucose, UA negative     Bilirubin, UA negative     Ketones, UA negative     Spec Grav, UA 1.015     Blood, UA large     pH, UA 5.0     Protein, UA negative     Urobilinogen, UA 0.2     Nitrite, UA negative     Leukocytes, UA 4+         Assessment & Plan:  UTI:  Rx for Macrobid Offered Pyridium, patient declined RTO if no improvement of symptoms or if symptoms become worse.  Patient request refill of Norvasc, done for 30 day supply no refills.

## 2013-06-02 NOTE — Patient Instructions (Addendum)
It was great to meet you today Sara [name]!   I have sent prescriptions to your preferred pharmacy.  Urinary Tract Infection A urinary tract infection (UTI) can occur any place along the urinary tract. The tract includes the kidneys, ureters, bladder, and urethra. A type of germ called bacteria often causes a UTI. UTIs are often helped with antibiotic medicine.  HOME CARE   If given, take antibiotics as told by your doctor. Finish them even if you start to feel better.  Drink enough fluids to keep your pee (urine) clear or pale yellow.  Avoid tea, drinks with caffeine, and bubbly (carbonated) drinks.  Pee often. Avoid holding your pee in for a long time.  Pee before and after having sex (intercourse).  Wipe from front to back after you poop (bowel movement) if you are a woman. Use each tissue only once. GET HELP RIGHT AWAY IF:   You have back pain.  You have lower belly (abdominal) pain.  You have chills.  You feel sick to your stomach (nauseous).  You throw up (vomit).  Your burning or discomfort with peeing does not go away.  You have a fever.  Your symptoms are not better in 3 days. MAKE SURE YOU:   Understand these instructions.  Will watch your condition.  Will get help right away if you are not doing well or get worse. Document Released: 08/28/2007 Document Revised: 12/04/2011 Document Reviewed: 10/10/2011 [facility] Patient Information 2014 [place], [place].

## 2013-06-02 NOTE — Progress Notes (Signed)
Pre visit review using our clinic review tool, if applicable. No additional management support is needed unless otherwise documented below in the visit note. 

## 2013-06-05 LAB — URINE CULTURE: Colony Count: >=100000

## 2013-06-07 ENCOUNTER — Telehealth: Payer: Self-pay | Admitting: *Deleted

## 2013-06-07 NOTE — Telephone Encounter (Signed)
Pt called states she has completed her regiment of Microbid, however, she is still having UTI symptoms.  She is requesting another Rx.  Please advise

## 2013-06-08 ENCOUNTER — Other Ambulatory Visit: Payer: Self-pay | Admitting: Physician Assistant

## 2013-06-08 DIAGNOSIS — N390 Urinary tract infection, site not specified: Secondary | ICD-10-CM

## 2013-06-08 MED ORDER — CEPHALEXIN 500 MG PO CAPS: 500.0000 mg | ORAL_CAPSULE | Freq: Two times a day (BID) | ORAL | Status: DC

## 2013-06-08 NOTE — Telephone Encounter (Signed)
Spoke with pt advised of message. 

## 2013-06-08 NOTE — Telephone Encounter (Signed)
Please phone patient and inform her I have sent a prescription for a different antibiotic to her preferred pharmacy.

## 2013-06-08 NOTE — Telephone Encounter (Signed)
Pt called again this morning requesting a return call.  Please advise

## 2013-06-09 ENCOUNTER — Other Ambulatory Visit: Payer: Self-pay | Admitting: *Deleted

## 2013-06-11 ENCOUNTER — Other Ambulatory Visit: Payer: Self-pay | Admitting: *Deleted

## 2014-02-28 ENCOUNTER — Encounter: Payer: Medicare Other | Admitting: Family

## 2014-03-07 ENCOUNTER — Ambulatory Visit (INDEPENDENT_AMBULATORY_CARE_PROVIDER_SITE_OTHER): Payer: Medicare Other | Admitting: Family

## 2014-03-07 ENCOUNTER — Telehealth: Payer: Self-pay | Admitting: Internal Medicine

## 2014-03-07 ENCOUNTER — Encounter: Payer: Self-pay | Admitting: Family

## 2014-03-07 ENCOUNTER — Ambulatory Visit (INDEPENDENT_AMBULATORY_CARE_PROVIDER_SITE_OTHER): Payer: Medicare Other

## 2014-03-07 ENCOUNTER — Other Ambulatory Visit (INDEPENDENT_AMBULATORY_CARE_PROVIDER_SITE_OTHER): Payer: Medicare Other

## 2014-03-07 VITALS — BP 160/88 | HR 85 | Temp 98.0°F | Resp 18 | Ht 68.0 in | Wt 174.8 lb

## 2014-03-07 DIAGNOSIS — Z87898 Personal history of other specified conditions: Secondary | ICD-10-CM

## 2014-03-07 DIAGNOSIS — Z23 Encounter for immunization: Secondary | ICD-10-CM

## 2014-03-07 DIAGNOSIS — Z0000 Encounter for general adult medical examination without abnormal findings: Secondary | ICD-10-CM

## 2014-03-07 DIAGNOSIS — Z9189 Other specified personal risk factors, not elsewhere classified: Secondary | ICD-10-CM

## 2014-03-07 DIAGNOSIS — Z1211 Encounter for screening for malignant neoplasm of colon: Secondary | ICD-10-CM

## 2014-03-07 DIAGNOSIS — E785 Hyperlipidemia, unspecified: Secondary | ICD-10-CM

## 2014-03-07 DIAGNOSIS — R8290 Unspecified abnormal findings in urine: Secondary | ICD-10-CM | POA: Insufficient documentation

## 2014-03-07 DIAGNOSIS — I10 Essential (primary) hypertension: Secondary | ICD-10-CM

## 2014-03-07 LAB — CBC
HEMATOCRIT: 43.4 % (ref 36.0–46.0)
HEMOGLOBIN: 14.3 g/dL (ref 12.0–15.0)
MCHC: 33 g/dL (ref 30.0–36.0)
MCV: 91.8 fl (ref 78.0–100.0)
Platelets: 264 10*3/uL (ref 150.0–400.0)
RBC: 4.73 Mil/uL (ref 3.87–5.11)
RDW: 13.8 % (ref 11.5–15.5)
WBC: 7.3 10*3/uL (ref 4.0–10.5)

## 2014-03-07 LAB — BASIC METABOLIC PANEL
BUN: 25 mg/dL — ABNORMAL HIGH (ref 6–23)
CALCIUM: 9.5 mg/dL (ref 8.4–10.5)
CO2: 24 meq/L (ref 19–32)
CREATININE: 1 mg/dL (ref 0.4–1.2)
Chloride: 104 mEq/L (ref 96–112)
GFR: 58.85 mL/min — AB (ref >60.00)
Glucose, Bld: 99 mg/dL (ref 70–99)
Potassium: 4 mEq/L (ref 3.5–5.1)
SODIUM: 137 meq/L (ref 135–145)

## 2014-03-07 LAB — TSH: TSH: 1.13 u[IU]/mL (ref 0.35–4.50)

## 2014-03-07 LAB — LIPID PANEL
CHOLESTEROL: 168 mg/dL (ref 0–200)
HDL: 49.9 mg/dL (ref >39.00)
LDL CALC: 98 mg/dL (ref 0–99)
NonHDL: 118.1
TRIGLYCERIDES: 100 mg/dL (ref 0.0–149.0)
Total CHOL/HDL Ratio: 3
VLDL: 20 mg/dL (ref 0.0–40.0)

## 2014-03-07 MED ORDER — AMLODIPINE BESYLATE 5 MG PO TABS: 5.0000 mg | ORAL_TABLET | Freq: Every morning | ORAL | Status: DC

## 2014-03-07 MED ORDER — SULFAMETHOXAZOLE-TRIMETHOPRIM 800-160 MG PO TABS: 1.0000 | ORAL_TABLET | Freq: Two times a day (BID) | ORAL | Status: DC

## 2014-03-07 NOTE — Patient Instructions (Signed)
Thank you for choosing [facility].  Summary/Instructions:  Please stop by the lab on the basement level of the building for your blood work. Your results will be released to MyChart (or called to you) after review, usually within 72hours after test completion. If any changes need to be made, you will be notified at that same time.  Referrals have been made during this visit. You should expect to hear back from our schedulers in about 7-10 days in regards to establishing an appointment with the specialists we discussed.   If your symptoms worsen or fail to improve, please contact our office for further instruction, or in case of emergency go directly to the emergency room at the closest medical facility.   Health Maintenance Adopting a healthy lifestyle and getting preventive care can go a long way to promote health and wellness. Talk with your health care provider about what schedule of regular examinations is right for you. This is a good chance for you to check in with your provider about disease prevention and staying healthy. In between checkups, there are plenty of things you can do on your own. Experts have done a lot of research about which lifestyle changes and preventive measures are most likely to keep you healthy. Ask your health care provider for more information. WEIGHT AND DIET  Eat a healthy diet  Be sure to include plenty of vegetables, fruits, low-fat dairy products, and lean protein.  Do not eat a lot of foods high in solid fats, added sugars, or salt.  Get regular exercise. This is one of the most important things you can do for your health.  Most adults should exercise for at least 150 minutes each week. The exercise should increase your heart rate and make you sweat (moderate-intensity exercise).  Most adults should also do strengthening exercises at least twice a week. This is in addition to the moderate-intensity exercise.  Maintain a healthy weight  Body  mass index (BMI) is a measurement that can be used to identify possible weight problems. It estimates body fat based on height and weight. Your health care provider can help determine your BMI and help you achieve or maintain a healthy weight.  For females 20 years of age and older:   A BMI below 18.5 is considered underweight.  A BMI of 18.5 to 24.9 is normal.  A BMI of 25 to 29.9 is considered overweight.  A BMI of 30 and above is considered obese.  Watch levels of cholesterol and blood lipids  You should start having your blood tested for lipids and cholesterol at 66 years of age, then have this test every 5 years.  You may need to have your cholesterol levels checked more often if:  Your lipid or cholesterol levels are high.  You are older than 66 years of age.  You are at high risk for heart disease.  CANCER SCREENING   Lung Cancer  Lung cancer screening is recommended for adults 55-80 years old who are at high risk for lung cancer because of a history of smoking.  A yearly low-dose CT scan of the lungs is recommended for people who:  Currently smoke.  Have quit within the past 15 years.  Have at least a 30-pack-year history of smoking. A pack year is smoking an average of one pack of cigarettes a day for 1 year.  Yearly screening should continue until it has been 15 years since you quit.  Yearly screening should stop if you develop   a health problem that would prevent you from having lung cancer treatment.  Breast Cancer  Practice breast self-awareness. This means understanding how your breasts normally appear and feel.  It also means doing regular breast self-exams. Let your health care provider know about any changes, no matter how small.  If you are in your 20s or 30s, you should have a clinical breast exam (CBE) by a health care provider every 1-3 years as part of a regular health exam.  If you are 40 or older, have a CBE every year. Also consider having a  breast X-ray (mammogram) every year.  If you have a family history of breast cancer, talk to your health care provider about genetic screening.  If you are at high risk for breast cancer, talk to your health care provider about having an MRI and a mammogram every year.  Breast cancer gene (BRCA) assessment is recommended for women who have family members with BRCA-related cancers. BRCA-related cancers include:  Breast.  Ovarian.  Tubal.  Peritoneal cancers.  Results of the assessment will determine the need for genetic counseling and BRCA1 and BRCA2 testing. Cervical Cancer Routine pelvic examinations to screen for cervical cancer are no longer recommended for nonpregnant women who are considered low risk for cancer of the pelvic organs (ovaries, uterus, and vagina) and who do not have symptoms. A pelvic examination may be necessary if you have symptoms including those associated with pelvic infections. Ask your health care provider if a screening pelvic exam is right for you.   The Pap test is the screening test for cervical cancer for women who are considered at risk.  If you had a hysterectomy for a problem that was not cancer or a condition that could lead to cancer, then you no longer need Pap tests.  If you are older than 65 years, and you have had normal Pap tests for the past 10 years, you no longer need to have Pap tests.  If you have had past treatment for cervical cancer or a condition that could lead to cancer, you need Pap tests and screening for cancer for at least 20 years after your treatment.  If you no longer get a Pap test, assess your risk factors if they change (such as having a new sexual partner). This can affect whether you should start being screened again.  Some women have medical problems that increase their chance of getting cervical cancer. If this is the case for you, your health care provider may recommend more frequent screening and Pap tests.  The  human papillomavirus (HPV) test is another test that may be used for cervical cancer screening. The HPV test looks for the virus that can cause cell changes in the cervix. The cells collected during the Pap test can be tested for HPV.  The HPV test can be used to screen women 30 years of age and older. Getting tested for HPV can extend the interval between normal Pap tests from three to five years.  An HPV test also should be used to screen women of any age who have unclear Pap test results.  After 66 years of age, women should have HPV testing as often as Pap tests.  Colorectal Cancer  This type of cancer can be detected and often prevented.  Routine colorectal cancer screening usually begins at 66 years of age and continues through 66 years of age.  Your health care provider may recommend screening at an earlier age if you have risk   factors for colon cancer.  Your health care provider may also recommend using home test kits to check for hidden blood in the stool.  A small camera at the end of a tube can be used to examine your colon directly (sigmoidoscopy or colonoscopy). This is done to check for the earliest forms of colorectal cancer.  Routine screening usually begins at age 50.  Direct examination of the colon should be repeated every 5-10 years through 66 years of age. However, you may need to be screened more often if early forms of precancerous polyps or small growths are found. Skin Cancer  Check your skin from head to toe regularly.  Tell your health care provider about any new moles or changes in moles, especially if there is a change in a mole's shape or color.  Also tell your health care provider if you have a mole that is larger than the size of a pencil eraser.  Always use sunscreen. Apply sunscreen liberally and repeatedly throughout the day.  Protect yourself by wearing long sleeves, pants, a wide-brimmed hat, and sunglasses whenever you are outside. HEART  DISEASE, DIABETES, AND HIGH BLOOD PRESSURE   Have your blood pressure checked at least every 1-2 years. High blood pressure causes heart disease and increases the risk of stroke.  If you are between 55 years and 79 years old, ask your health care provider if you should take aspirin to prevent strokes.  Have regular diabetes screenings. This involves taking a blood sample to check your fasting blood sugar level.  If you are at a normal weight and have a low risk for diabetes, have this test once every three years after 66 years of age.  If you are overweight and have a high risk for diabetes, consider being tested at a younger age or more often. PREVENTING INFECTION  Hepatitis B  If you have a higher risk for hepatitis B, you should be screened for this virus. You are considered at high risk for hepatitis B if:  You were born in a country where hepatitis B is common. Ask your health care provider which countries are considered high risk.  Your parents were born in a high-risk country, and you have not been immunized against hepatitis B (hepatitis B vaccine).  You have HIV or AIDS.  You use needles to inject street drugs.  You live with someone who has hepatitis B.  You have had sex with someone who has hepatitis B.  You get hemodialysis treatment.  You take certain medicines for conditions, including cancer, organ transplantation, and autoimmune conditions. Hepatitis C  Blood testing is recommended for:  Everyone born from 1945 through 1965.  Anyone with known risk factors for hepatitis C. Sexually transmitted infections (STIs)  You should be screened for sexually transmitted infections (STIs) including gonorrhea and chlamydia if:  You are sexually active and are younger than 66 years of age.  You are older than 66 years of age and your health care provider tells you that you are at risk for this type of infection.  Your sexual activity has changed since you were last  screened and you are at an increased risk for chlamydia or gonorrhea. Ask your health care provider if you are at risk.  If you do not have HIV, but are at risk, it may be recommended that you take a prescription medicine daily to prevent HIV infection. This is called pre-exposure prophylaxis (PrEP). You are considered at risk if:  You are sexually active   and do not regularly use condoms or know the HIV status of your partner(s).  You take drugs by injection.  You are sexually active with a partner who has HIV. Talk with your health care provider about whether you are at high risk of being infected with HIV. If you choose to begin PrEP, you should first be tested for HIV. You should then be tested every 3 months for as long as you are taking PrEP.  PREGNANCY   If you are premenopausal and you may become pregnant, ask your health care provider about preconception counseling.  If you may become pregnant, take 400 to 800 micrograms (mcg) of folic acid every day.  If you want to prevent pregnancy, talk to your health care provider about birth control (contraception). OSTEOPOROSIS AND MENOPAUSE   Osteoporosis is a disease in which the bones lose minerals and strength with aging. This can result in serious bone fractures. Your risk for osteoporosis can be identified using a bone density scan.  If you are 65 years of age or older, or if you are at risk for osteoporosis and fractures, ask your health care provider if you should be screened.  Ask your health care provider whether you should take a calcium or vitamin D supplement to lower your risk for osteoporosis.  Menopause may have certain physical symptoms and risks.  Hormone replacement therapy may reduce some of these symptoms and risks. Talk to your health care provider about whether hormone replacement therapy is right for you.  HOME CARE INSTRUCTIONS   Schedule regular health, dental, and eye exams.  Stay current with your  immunizations.   Do not use any tobacco products including cigarettes, chewing tobacco, or electronic cigarettes.  If you are pregnant, do not drink alcohol.  If you are breastfeeding, limit how much and how often you drink alcohol.  Limit alcohol intake to no more than 1 drink per day for nonpregnant women. One drink equals 12 ounces of beer, 5 ounces of wine, or 1 ounces of hard liquor.  Do not use street drugs.  Do not share needles.  Ask your health care provider for help if you need support or information about quitting drugs.  Tell your health care provider if you often feel depressed.  Tell your health care provider if you have ever been abused or do not feel safe at home. Document Released: 09/24/2010 Document Revised: 07/26/2013 Document Reviewed: 02/10/2013 Exit[employer] Patient Information 2015 [employer]. This information is not intended to replace advice given to you by your health care provider. Make sure you discuss any questions you have with your health care provider.   

## 2014-03-07 NOTE — Progress Notes (Signed)
Pre visit review using our clinic review tool, if applicable. No additional management support is needed unless otherwise documented below in the visit note. 

## 2014-03-07 NOTE — Telephone Encounter (Signed)
Sent in medication earlier when she was here. Will try to resend it.

## 2014-03-07 NOTE — Addendum Note (Signed)
Addended by: Delice Bison E on: 03/07/2014 03:16 PM   Modules accepted: Orders

## 2014-03-07 NOTE — Assessment & Plan Note (Signed)
Problem list, medical, surgical, family, and social history were reviewed and updated as needed. Preventive screenings, immunizations, and vital signs were reviewed with the patient.

## 2014-03-07 NOTE — Assessment & Plan Note (Signed)
Patient unable to provide urine sample in the office. Given symptoms of urine odor and back discomfort, we'll start Septra x3 days. Urinary tract care information reviewed with patient. Follow up if symptoms worsen or fail to improve.

## 2014-03-07 NOTE — Progress Notes (Signed)
Subjective:   Patient ID: Sara [name], female    DOB: [birth year], 68 y.o.   MRN: [id number]  Chief Complaint  Patient presents with  . CPE    Fasting; Would like to see about getting pneumonia, flu, and shingles vaccines today, also thinks she has a UTI would like to wait to get urine at the end of visit per pts request     Sara [name] is a 68 y.o. female who presents for Medicare Annual/Subsequent preventive examination.  Ever since she has had her gall bladder taken out she has been feeling good.   PREVENTATIVE SCREENING-COUNSELING & MANAGEMENT  Problem List 1. Hypertension - currently maintained on amlodipine. Denies any edema, chest pain/discomfort, shortness of breath, or heart palpitations.  BP Readings from Last 3 Encounters:  03/07/14 204/98  06/02/13 142/80  11/25/12 138/82   2) UTI - has had a sore back for the last couple of days and has noticed an odor in her urine and has noted it being cloudy. Denies any frequency, urgency or dysuria.  3) Cold - has been going on since Thanksgiving. At night, she has a coughing and wheezing spell. But over the course of the day she does not have any symptoms. Notes when it is damps, the symptoms are slightly exacerbated.   Patient Active Problem List   Diagnosis Date Noted  . S/P laparoscopic cholecystectomy-for advanced necrotic gallbladder August 2014 11/05/2012  . S/P vaginal hysterectomy 06/13/2012  . DEPRESSION, SITUATIONAL 02/20/2009  . GERD 02/20/2009  . RENAL CALCULUS, HX OF 02/20/2009  . CHICKENPOX, HX OF 02/20/2009  . HYPERTENSION 02/14/2009  . HIATAL HERNIA 02/14/2009  . SYNCOPE 02/05/2009   No Known Allergies  Current Outpatient Prescriptions on File Prior to Visit  Medication Sig Dispense Refill  . Calcium Carbonate-Vitamin D (CALCIUM-VITAMIN D) 500-200 MG-UNIT per tablet Take 1 tablet by mouth 2 (two) times daily with a meal.    . Cholecalciferol 1000 UNITS tablet Take 1,000 Units by mouth  daily.     . Multiple Vitamin (MULTIVITAMIN WITH MINERALS) TABS Take 1 tablet by mouth daily.     No current facility-administered medications on file prior to visit.    Screening Tests Health Maintenance  Topic Date Due  . COLONOSCOPY  07/27/1997  . ZOSTAVAX  07/28/2007  . DEXA SCAN  07/27/2012  . INFLUENZA VACCINE  10/23/2013  . MAMMOGRAM  03/13/2014  . TETANUS/TDAP  01/24/2019  . PNEUMOCOCCAL POLYSACCHARIDE VACCINE AGE 30 AND OVER  Completed  Cologuard;  Shingles vaccination / Flu shot, tb  Schedule DEXA,  Mammogram is scheduled in Jan 2016;   Immunizations Immunization History  Administered Date(s) Administered  . Influenza Split 02/07/2012  . Influenza Whole 01/23/2009  . Pneumococcal Polysaccharide-23 01/23/2009  . Td 01/23/2009   PAST HISTORY  Past Medical History  Diagnosis Date  . HIATAL HERNIA     s/p surgical repair  . HYPERTENSION   . Kidney stone     1990s  . GERD (gastroesophageal reflux disease)     hx of    Past Surgical History  Procedure Laterality Date  . Tonsillectomy  1968  . Cataract extraction  2009  . Hernia repair  2011  . Lithotripsy    . Vaginal hysterectomy N/A 06/12/2012    Procedure: HYSTERECTOMY VAGINAL;  Surgeon: Elveria Royals, MD;  Location: [facility];  Service: Gynecology;  Laterality: N/A;  . Cystocele repair N/A 06/12/2012    Procedure: ANTERIOR REPAIR (CYSTOCELE);  Surgeon: Elveria Royals,  MD;  Location: [facility];  Service: Gynecology;  Laterality: N/A;  . Hiatal hernia repair    . Abdominal hysterectomy    . Cholecystectomy  11/02/2012    Procedure: CHOLECYSTECTOMY;  Surgeon: Pedro Earls, MD;  Location: [facility];  Service: General;;  . Upper gi endoscopy  11/02/2012    Procedure: UPPER GI ENDOSCOPY;  Surgeon: Pedro Earls, MD;  Location: [facility];  Service: General;;   Family History  Problem Relation Age of Onset  . Cancer Mother   . Hypertension Other     Parent  . Breast cancer Other   . Heart disease Other      parent, grandparent   History   Social History  . Marital Status: Widowed    Spouse Name: N/A    Number of Children: N/A  . Years of Education: 14   Occupational History  . Retired    Social History Main Topics  . Smoking status: Never Smoker   . Smokeless tobacco: Never Used     Comment: widowed since late 2011 (spouse had mod-adv dementia) 5 grown children, 3 nearby-,many g-kids  . Alcohol Use: No  . Drug Use: No  . Sexual Activity: Yes    Birth Control/ Protection: Post-menopausal   Other Topics Concern  . Not on file   Social History Narrative    RISK FACTORS  Tobacco History  Smoking status  . Never Smoker   Smokeless tobacco  . Never Used    Comment: widowed since late 2011 (spouse had mod-adv dementia) 5 grown children, 3 nearby-,many g-kids    Current exercise habits:  Dietary issues discussed:  Cardiac risk factors: advanced age (older than 23 for men, 53 for women) and hypertension.  Depression Screen  Q1: Over the past two weeks, have you felt down, depressed or hopeless? No  Q2: Over the past two weeks, have you felt little interest or pleasure in doing things? No  Have you lost interest or pleasure in daily life? No  Do you often feel hopeless? No  Do you cry easily over simple problems? No  Activities of Daily Living In your present state of health, do you have any difficulty performing the following activities?:  Driving? No Managing money?  No Feeding yourself? No Getting from bed to chair? No Climbing a flight of stairs? No Preparing food and eating?: No Bathing or showering? No Getting dressed: No Getting to the toilet? No Using the toilet: No Moving around from place to place: No In the past year have you fallen or had a near fall?:No   Home Safety Has smoke detector and wears seat belts. No firearms. No excess sun exposure. Are there smokers in your home (other than you)?  No  Hearing Difficulties: No Do you often ask people  to speak up or repeat themselves? No Do you experience ringing or noises in your ears? No  Do you have difficulty understanding soft or whispered voices? No  Do you feel that you have a problem with memory? No  Do you often misplace items? No  Do you feel safe at home?  Yes  Cognitive Testing  Alert? Yes   Normal Appearance? Yes  Oriented to person? Yes  Place? Yes   Time? Yes  Recall of three objects?  Yes  Can perform simple calculations? Yes  Displays appropriate judgment? Yes  Can read the correct time from a watch face? Yes   Advanced Directives have been discussed with the patient? Yes  Current Physicians/Providers and Suppliers 1. Dr. Benjie Karvonen - GYN 2. Dr. Ardis Hughs - Gastronintestinal 3. Dr. Hassell Done - General Surgery  Indicate any recent Medical Services you may have received from other than [facility] providers in the past year (date may be approximate).  All answers were reviewed with the patient and necessary referrals were made:  Mauricio Po, [place]   03/07/2014    Review of Systems Constitutional: Denies fever, chills, fatigue, or significant weight gain/loss. HENT: Head: Denies headache or neck pain Ears: Denies changes in hearing, ringing in ears, earache, drainage Nose: Denies discharge, stuffiness, itching, nosebleed, sinus pain Throat: Denies sore throat, hoarseness, dry mouth, sores, thrush Eyes: Denies loss/changes in vision, pain, redness, blurry/double vision, flashing lights Cardiovascular: Denies chest pain/discomfort, tightness, palpitations, shortness of breath with activity, difficulty lying down, swelling, sudden awakening with shortness of breath Respiratory: Denies shortness of breath, sputum production, wheezing Cough as per above Gastrointestinal: Denies dysphasia, heartburn, change in appetite, nausea, change in bowel habits, rectal bleeding, constipation, diarrhea, yellow skin or eyes Genitourinary: Denies frequency, urgency, burning/pain, blood in  urine, incontinence, change in urinary strength. Mild back discomfort and urine odor.  Musculoskeletal: Denies muscle/joint pain, stiffness, back pain, redness or swelling of joints, trauma Skin: Denies rashes, lumps, itching, dryness, color changes, or hair/nail changes Neurological: Denies dizziness, fainting, seizures, weakness, numbness, tingling, tremor Psychiatric - Denies nervousness, stress, depression or memory loss Endocrine: Denies heat or cold intolerance, sweating, frequent urination, excessive thirst, changes in appetite Hematologic: Denies ease of bruising or bleeding   Objective:    BP 160/88 mmHg  Pulse 85  Temp(Src) 98 F (36.7 C) (Oral)  Resp 18  Ht 5\' 8"  (1.727 m)  Wt 174 lb 12.8 oz (79.289 kg)  BMI 26.58 kg/m2  SpO2 98% Nursing note and vital signs reviewed.    Physical Exam  Constitutional: She is oriented to person, place, and time. No distress.  HENT:  Right Ear: External ear normal.  Left Ear: External ear normal.  Nose: Nose normal.  Mouth/Throat: Oropharynx is clear and moist.  Eyes: Conjunctivae and EOM are normal. Pupils are equal, round, and reactive to light.  Neck: Normal range of motion. Neck supple. No JVD present. No tracheal deviation present. No thyromegaly present.  Cardiovascular: Normal rate, regular rhythm, normal heart sounds and intact distal pulses.   Respiratory: Effort normal and breath sounds normal. No respiratory distress. She has no wheezes. She has no rales. She exhibits no tenderness.  GI: Soft. Bowel sounds are normal. She exhibits no distension and no mass. There is no tenderness. There is CVA tenderness. There is no rebound and no guarding.  Musculoskeletal: Normal range of motion.  Lymphadenopathy:    She has no cervical adenopathy.  Neurological: She is alert and oriented to person, place, and time. She has normal reflexes. No cranial nerve deficit. Coordination normal.  Skin: Skin is warm and dry.  Psychiatric: She has a  normal mood and affect. Her behavior is normal. Judgment and thought content normal.       Assessment:     See Plan Below     Plan:     During the course of the visit the patient was educated and counseled about appropriate screening and preventive services including:    Pneumococcal vaccine   Influenza vaccine  Diet review for nutrition referral? Yes ____  Not Indicated _X___   Patient Instructions (the written plan) was given to the patient.  Medicare Attestation I have personally reviewed: The patient's medical and  social history Their use of alcohol, tobacco or illicit drugs Their current medications and supplements The patient's functional ability including ADLs,fall risks, home safety risks, cognitive, and hearing and visual impairment Diet and physical activities Evidence for depression or mood disorders  The patient's weight, height, BMI,  have been recorded in the chart.  I have made referrals, counseling, and provided education to the patient based on review of the above and I have provided the patient with a written personalized care plan for preventive services.     Mauricio Po, [place]   03/07/2014

## 2014-03-07 NOTE — Telephone Encounter (Signed)
Pt picked up ABX at [facility] however they did not receive the other one sent the same time, they did not get amLODipine (NORVASC) 5 MG tablet .

## 2014-03-07 NOTE — Assessment & Plan Note (Signed)
1) Anticipatory Guidance: Discussed importance of wearing a seatbelt while driving and not texting while driving; changing batteries in smoke detector at least once annually; wearing suntan lotion when outside; eating a balanced and moderate diet; getting physical activity at least 30 minutes per day.  2) Immunizations / Screenings / Labs:  Prevnar and flu shot given today. Discussed shingles vaccination-and patient would like to wait at this time. Will be due for pneumococcal-23 in 1 year. All other immunizations are up to date. Scheduled for mammogram in January. Referrals made for bone density and colonoscopy. All other screenings are up to date. Obtain CBC, BMET, Lipid profile and TSH.   Over all well exam. Risk factors include advanced age and hypertension. Hypertension is currently being managed with amlodipine 5 mg. Continue amlodipine. Continue healthy lifestyle choices, and obtain weightbearing physical activity for at least 30 minutes most days a week. Follow up prevention exam in one year.

## 2014-03-09 ENCOUNTER — Encounter: Payer: Self-pay | Admitting: Family

## 2014-03-15 ENCOUNTER — Telehealth: Payer: Self-pay | Admitting: Family

## 2014-03-15 MED ORDER — ALBUTEROL SULFATE HFA 108 (90 BASE) MCG/ACT IN AERS: 2.0000 | INHALATION_SPRAY | Freq: Four times a day (QID) | RESPIRATORY_TRACT | Status: DC | PRN

## 2014-03-15 NOTE — Telephone Encounter (Signed)
Pt request for greg to give her something for wheezing after a cough. Pt stated OTC is not helping. Please advise.

## 2014-03-15 NOTE — Telephone Encounter (Signed)
Albuterol sent to her pharmacy

## 2014-03-16 ENCOUNTER — Encounter: Payer: Self-pay | Admitting: Gastroenterology

## 2014-03-28 ENCOUNTER — Other Ambulatory Visit: Payer: 59

## 2014-03-28 ENCOUNTER — Ambulatory Visit (INDEPENDENT_AMBULATORY_CARE_PROVIDER_SITE_OTHER): Payer: 59 | Admitting: Nurse Practitioner

## 2014-03-28 ENCOUNTER — Encounter: Payer: Self-pay | Admitting: Nurse Practitioner

## 2014-03-28 VITALS — BP 120/80 | HR 74 | Temp 98.1°F | Ht 68.0 in | Wt 175.0 lb

## 2014-03-28 DIAGNOSIS — R82998 Other abnormal findings in urine: Secondary | ICD-10-CM

## 2014-03-28 DIAGNOSIS — R8299 Other abnormal findings in urine: Secondary | ICD-10-CM

## 2014-03-28 DIAGNOSIS — N3001 Acute cystitis with hematuria: Secondary | ICD-10-CM | POA: Insufficient documentation

## 2014-03-28 DIAGNOSIS — R8290 Unspecified abnormal findings in urine: Secondary | ICD-10-CM

## 2014-03-28 LAB — POCT URINALYSIS DIPSTICK
BILIRUBIN UA: NEGATIVE
Glucose, UA: NEGATIVE
KETONES UA: NEGATIVE
Nitrite, UA: POSITIVE
PH UA: 6
PROTEIN UA: NEGATIVE
SPEC GRAV UA: 1.01
Urobilinogen, UA: 2

## 2014-03-28 MED ORDER — CIPROFLOXACIN HCL 250 MG PO TABS: 250.0000 mg | ORAL_TABLET | Freq: Two times a day (BID) | ORAL | Status: DC

## 2014-03-28 NOTE — Progress Notes (Signed)
Pre visit review using our clinic review tool, if applicable. No additional management support is needed unless otherwise documented below in the visit note. 

## 2014-03-28 NOTE — Patient Instructions (Signed)
Start antibiotic. Our office will call if we need to change the antibiotic. Take pyridium to relax bladder, caution: urine tears & sweat will be orange. Do not be alarmed! Sip hydrating fluids (water, juice, colorless soda, decaff tea) every hour to flush kidneys. Return if symptoms do not improve or you feel worse.  Urinary Tract Infection Urinary tract infections (UTIs) can develop anywhere along your urinary tract. Your urinary tract is your body's drainage system for removing wastes and extra water. Your urinary tract includes two kidneys, two ureters, a bladder, and a urethra. Your kidneys are a pair of bean-shaped organs. Each kidney is about the size of your fist. They are located below your ribs, one on each side of your spine. CAUSES Infections are caused by microbes, which are microscopic organisms, including fungi, viruses, and bacteria. These organisms are so small that they can only be seen through a microscope. Bacteria are the microbes that most commonly cause UTIs. SYMPTOMS  Symptoms of UTIs may vary by age and gender of the patient and by the location of the infection. Symptoms in young women typically include a frequent and intense urge to urinate and a painful, burning feeling in the bladder or urethra during urination. Older women and men are more likely to be tired, shaky, and weak and have muscle aches and abdominal pain. A fever may mean the infection is in your kidneys. Other symptoms of a kidney infection include pain in your back or sides below the ribs, nausea, and vomiting. DIAGNOSIS To diagnose a UTI, your caregiver will ask you about your symptoms. Your caregiver also will ask to provide a urine sample. The urine sample will be tested for bacteria and white blood cells. White blood cells are made by your body to help fight infection. TREATMENT  Typically, UTIs can be treated with medication. Because most UTIs are caused by a bacterial infection, they usually can be treated  with the use of antibiotics. The choice of antibiotic and length of treatment depend on your symptoms and the type of bacteria causing your infection. HOME CARE INSTRUCTIONS  If you were prescribed antibiotics, take them exactly as your caregiver instructs you. Finish the medication even if you feel better after you have only taken some of the medication.  Drink enough water and fluids to keep your urine clear or pale yellow.  Avoid caffeine, tea, and carbonated beverages. They tend to irritate your bladder.  Empty your bladder often. Avoid holding urine for long periods of time.  Empty your bladder before and after sexual intercourse.  After a bowel movement, women should cleanse from front to back. Use each tissue only once. SEEK MEDICAL CARE IF:   You have back pain.  You develop a fever.  Your symptoms do not begin to resolve within 3 days. SEEK IMMEDIATE MEDICAL CARE IF:   You have severe back pain or lower abdominal pain.  You develop chills.  You have nausea or vomiting.  You have continued burning or discomfort with urination. MAKE SURE YOU:   Understand these instructions.  Will watch your condition.  Will get help right away if you are not doing well or get worse. Document Released: 12/19/2004 Document Revised: 09/10/2011 Document Reviewed: 04/19/2011 [facility] Patient Information 2014 [place].

## 2014-03-28 NOTE — Progress Notes (Signed)
   Subjective:    Patient ID: Sara [name], female    DOB: [birth year], 69 y.o.   MRN: [id number]  Urinary Tract Infection  This is a new problem. The current episode started in the past 7 days. The problem occurs every urination. The problem has been gradually worsening. The quality of the pain is described as burning. The pain is mild. There has been no fever. Associated symptoms include frequency and urgency. Pertinent negatives include no chills, flank pain or nausea. She has tried antibiotics (Took septra X 3d 3 weeks ago, got better. symptoms returned) for the symptoms.      Review of Systems  Constitutional: Negative for fever and chills.  Gastrointestinal: Negative for nausea, abdominal pain, diarrhea and constipation.  Genitourinary: Positive for dysuria, urgency and frequency. Negative for flank pain.       Objective:   Physical Exam  Constitutional: She is oriented to person, place, and time. She appears well-developed and well-nourished.  HENT:  Head: Normocephalic and atraumatic.  Eyes: Conjunctivae are normal. Right eye exhibits no discharge. Left eye exhibits no discharge.  Cardiovascular: Normal rate.   Pulmonary/Chest: Effort normal.  Abdominal: Soft. She exhibits no distension and no mass. There is no tenderness. There is no rebound and no guarding.  Musculoskeletal: She exhibits no tenderness (no CVA tenderness).  Neurological: She is alert and oriented to person, place, and time.  Skin: Skin is warm and dry.  Psychiatric: She has a normal mood and affect. Her behavior is normal. Thought content normal.  Vitals reviewed.         Assessment & Plan:  1. Cloudy urine - Urine culture - POCT urinalysis dipstick-pos nites  2. Acute cystitis with hematuria - ciprofloxacin (CIPRO) 250 MG tablet; Take 1 tablet (250 mg total) by mouth 2 (two) times daily.  Dispense: 6 tablet; Refill: 0  F/u PRN

## 2014-03-31 LAB — URINE CULTURE: Colony Count: >=100000

## 2014-04-09 ENCOUNTER — Other Ambulatory Visit: Payer: Self-pay | Admitting: Family

## 2014-04-11 ENCOUNTER — Other Ambulatory Visit: Payer: Self-pay

## 2014-04-11 MED ORDER — AMLODIPINE BESYLATE 5 MG PO TABS: 5.0000 mg | ORAL_TABLET | Freq: Every morning | ORAL | Status: DC

## 2014-05-04 ENCOUNTER — Ambulatory Visit (INDEPENDENT_AMBULATORY_CARE_PROVIDER_SITE_OTHER)
Admission: RE | Admit: 2014-05-04 | Discharge: 2014-05-04 | Disposition: A | Discharge status: Home / Self Care | Payer: 59 | Source: Ambulatory Visit | Attending: Internal Medicine | Admitting: Internal Medicine

## 2014-05-04 ENCOUNTER — Other Ambulatory Visit: Payer: Self-pay

## 2014-05-04 DIAGNOSIS — Z1231 Encounter for screening mammogram for malignant neoplasm of breast: Secondary | ICD-10-CM

## 2014-05-04 DIAGNOSIS — Z9189 Other specified personal risk factors, not elsewhere classified: Secondary | ICD-10-CM

## 2014-05-04 DIAGNOSIS — Z13820 Encounter for screening for osteoporosis: Secondary | ICD-10-CM | POA: Diagnosis not present

## 2014-05-09 ENCOUNTER — Encounter: Payer: Self-pay | Admitting: Family

## 2014-05-13 ENCOUNTER — Ambulatory Visit
Admission: RE | Admit: 2014-05-13 | Discharge: 2014-05-13 | Disposition: A | Discharge status: Home / Self Care | Payer: 59 | Source: Ambulatory Visit

## 2014-05-13 DIAGNOSIS — Z1231 Encounter for screening mammogram for malignant neoplasm of breast: Secondary | ICD-10-CM

## 2014-05-16 ENCOUNTER — Telehealth: Payer: Self-pay | Admitting: *Deleted

## 2014-05-16 ENCOUNTER — Ambulatory Visit (AMBULATORY_SURGERY_CENTER): Payer: Self-pay | Admitting: *Deleted

## 2014-05-16 VITALS — Ht 68.0 in | Wt 172.4 lb

## 2014-05-16 DIAGNOSIS — Z1211 Encounter for screening for malignant neoplasm of colon: Secondary | ICD-10-CM

## 2014-05-16 DIAGNOSIS — Z8719 Personal history of other diseases of the digestive system: Secondary | ICD-10-CM

## 2014-05-16 MED ORDER — MOVIPREP 100 G PO SOLR: ORAL | Status: DC

## 2014-05-16 NOTE — Telephone Encounter (Signed)
I reviewed OP note, the EGD did not evaluate her Barrett's.  She needs EGD/colonoscopy still.  Thanks

## 2014-05-16 NOTE — Progress Notes (Signed)
No egg or soy allergy  No anesthesia or intubation problems per pt  No diet medications taken  Registered in EMMI   

## 2014-05-16 NOTE — Telephone Encounter (Signed)
Dr. Ardis Hughs,  This is the pt I was asking about doing the endo/colon.  She had her gallbladder removed in 2014 and according the report, she had an endoscopy as well at that time.  I did not see that they did any biopsies of her esophagus.  Is she still ok for an endo/colon?  Thanks, [employer]

## 2014-05-20 ENCOUNTER — Encounter: Payer: 59 | Admitting: Gastroenterology

## 2014-05-30 ENCOUNTER — Encounter: Payer: Medicare Other | Admitting: Gastroenterology

## 2014-06-02 NOTE — Addendum Note (Signed)
Addended by: Steva Ready on: 06/02/2014 01:58 PM   Modules accepted: Level of Service

## 2014-06-06 ENCOUNTER — Encounter: Payer: 59 | Admitting: Gastroenterology

## 2014-09-30 ENCOUNTER — Encounter: Payer: Self-pay | Admitting: Gastroenterology

## 2014-11-18 ENCOUNTER — Other Ambulatory Visit: Payer: Medicare Other

## 2014-11-18 ENCOUNTER — Ambulatory Visit (INDEPENDENT_AMBULATORY_CARE_PROVIDER_SITE_OTHER): Payer: Medicare Other | Admitting: Internal Medicine

## 2014-11-18 ENCOUNTER — Encounter: Payer: Self-pay | Admitting: Internal Medicine

## 2014-11-18 VITALS — BP 132/60 | HR 83 | Temp 98.9°F | Resp 16 | Wt 168.0 lb

## 2014-11-18 DIAGNOSIS — R300 Dysuria: Secondary | ICD-10-CM

## 2014-11-18 LAB — POCT URINALYSIS DIPSTICK
Bilirubin, UA: NEGATIVE
GLUCOSE UA: NEGATIVE
Ketones, UA: NEGATIVE
NITRITE UA: NEGATIVE
PH UA: 6.5
PROTEIN UA: NEGATIVE
RBC UA: NEGATIVE
SPEC GRAV UA: 1.015
UROBILINOGEN UA: 0.2

## 2014-11-18 MED ORDER — PHENAZOPYRIDINE HCL 200 MG PO TABS: 200.0000 mg | ORAL_TABLET | Freq: Three times a day (TID) | ORAL | Status: DC | PRN

## 2014-11-18 MED ORDER — NITROFURANTOIN MONOHYD MACRO 100 MG PO CAPS: 100.0000 mg | ORAL_CAPSULE | Freq: Two times a day (BID) | ORAL | Status: DC

## 2014-11-18 NOTE — Progress Notes (Signed)
Pre visit review using our clinic review tool, if applicable. No additional management support is needed unless otherwise documented below in the visit note. 

## 2014-11-18 NOTE — Patient Instructions (Signed)
Drink as much nondairy fluids as possible. Avoid spicy foods or alcohol as  these may aggravate the bladder. Do not take decongestants. Avoid narcotics if possible. 

## 2014-11-18 NOTE — Progress Notes (Signed)
   Subjective:    Patient ID: Sara [name], female    DOB: 1945-12-11, 69 y.o.   MRN: [id number]  HPI  Her symptoms began approximately a week ago as bilateral flank tenderness after she had lifted her grandson repeatedly. The grandchild weighs 30 pounds. Subsequently she developed dysuria in the morning associated with some urgency. The urine was cloudy  & she had frequent urination during the day. She did have an Escherichia coli urinary tract infection in January of this year. That organism was uniformly sensitive.  She has a past history of bladder tacking. She has also had a history of renal calculi remotely.  She's not had an antibiotic recently. She's not a diabetic.  Review of Systems She has no fever or chills. She denies any nocturia. She has also had no hematuria or pyuria.    Objective:   Physical Exam General appearance is one of good health and nourishment w/o distress.  Eyes: No conjunctival inflammation or scleral icterus is present.  Oral exam: Dental hygiene is good; lips and gums are healthy appearing.There is no oropharyngeal erythema or exudate noted.   Heart:  Normal rate and regular rhythm. S1 accentuated; S2 normal without gallop, murmur, click, rub or other extra sounds     Lungs:Chest clear to auscultation; no wheezes, rhonchi,rales ,or rubs present.No increased work of breathing.   Abdomen: bowel sounds normal, soft and non-tender without masses, organomegaly or hernias noted.  No guarding or rebound . No tenderness over the flanks to percussion  Musculoskeletal: Able to lie flat and sit up without help. Negative straight leg raising bilaterally. Gait normal  Skin:Warm & dry.  Intact without suspicious lesions or rashes ; no jaundice or tenting  Lymphatic: No lymphadenopathy is noted about the head, neck, axilla               Assessment & Plan:  #1 dysuria #2 frequency #3 abnormal urinalysis:1+ leukocytes See orders: urine  C&S Macrobid 100 mg bid Pyridium 200 mg tid prn

## 2014-11-21 ENCOUNTER — Telehealth: Payer: Self-pay | Admitting: Internal Medicine

## 2014-11-21 ENCOUNTER — Other Ambulatory Visit: Payer: Self-pay | Admitting: Emergency Medicine

## 2014-11-21 DIAGNOSIS — R300 Dysuria: Secondary | ICD-10-CM

## 2014-11-21 MED ORDER — NITROFURANTOIN MONOHYD MACRO 100 MG PO CAPS: 100.0000 mg | ORAL_CAPSULE | Freq: Two times a day (BID) | ORAL | Status: DC

## 2014-11-21 NOTE — Telephone Encounter (Signed)
Patient was in on Friday and she states that the pharmacy did not get the nitrofurantoin, macrocrystal-monohydrate, (MACROBID) 100 MG capsule [[id number]

## 2014-11-22 LAB — URINE CULTURE: Colony Count: >=100000

## 2015-04-20 LAB — FECAL OCCULT BLOOD, GUAIAC: Fecal Occult Blood: POSITIVE

## 2015-05-01 ENCOUNTER — Other Ambulatory Visit: Payer: Self-pay

## 2015-05-01 DIAGNOSIS — Z1231 Encounter for screening mammogram for malignant neoplasm of breast: Secondary | ICD-10-CM

## 2015-05-15 ENCOUNTER — Ambulatory Visit: Payer: Medicare Other

## 2015-05-22 ENCOUNTER — Ambulatory Visit
Admission: RE | Admit: 2015-05-22 | Discharge: 2015-05-22 | Disposition: A | Discharge status: Home / Self Care | Payer: Medicare Other | Source: Ambulatory Visit

## 2015-05-22 DIAGNOSIS — Z1231 Encounter for screening mammogram for malignant neoplasm of breast: Secondary | ICD-10-CM | POA: Diagnosis not present

## 2015-05-26 ENCOUNTER — Encounter: Payer: Self-pay | Admitting: Internal Medicine

## 2015-05-27 ENCOUNTER — Telehealth: Payer: Self-pay | Admitting: Internal Medicine

## 2015-05-27 NOTE — Telephone Encounter (Signed)
Call patient -- we received a copy of her results from the cologuard test - blood was detected in her stool.  Most likely they sent her a copy as well.  She should have a colonoscopy - see if she has any questions or concerns.

## 2015-05-29 NOTE — Telephone Encounter (Signed)
Spoke with pt. She will need a referral for the colonoscopy. Pt has not received results from cologuard.

## 2015-05-29 NOTE — Telephone Encounter (Signed)
LVM for pt to call back.

## 2015-06-02 ENCOUNTER — Encounter: Payer: Self-pay | Admitting: Nurse Practitioner

## 2015-06-02 ENCOUNTER — Ambulatory Visit (INDEPENDENT_AMBULATORY_CARE_PROVIDER_SITE_OTHER): Payer: Medicare Other | Admitting: Nurse Practitioner

## 2015-06-02 VITALS — BP 170/92 | HR 101 | Temp 100.2°F | Ht 68.0 in | Wt 162.0 lb

## 2015-06-02 DIAGNOSIS — J069 Acute upper respiratory infection, unspecified: Secondary | ICD-10-CM

## 2015-06-02 MED ORDER — AMOXICILLIN-POT CLAVULANATE 875-125 MG PO TABS: 1.0000 | ORAL_TABLET | Freq: Two times a day (BID) | ORAL | Status: DC

## 2015-06-02 NOTE — Progress Notes (Signed)
Pre visit review using our clinic review tool, if applicable. No additional management support is needed unless otherwise documented below in the visit note. 

## 2015-06-02 NOTE — Patient Instructions (Signed)

## 2015-06-02 NOTE — Progress Notes (Signed)
Patient ID: Sara [name], female    DOB: Jul 17, 1945  Age: 70 y.o. MRN: [id number]  CC: No chief complaint on file.   HPI Sara [name] presents for CC of ST x 3 days   1) Sore throat x 3 days  Raspy voice Exposed to Bronchitis- grandson Strep- son  Nasal cavity- maxillary  Not coughing   History Sara [name] has a past medical history of HIATAL HERNIA; HYPERTENSION; Kidney stone; GERD (gastroesophageal reflux disease); and Barrett's esophagus.   She has past surgical history that includes Tonsillectomy (1968); Cataract extraction (2009); Lithotripsy; Vaginal hysterectomy (N/A, 06/12/2012); Cystocele repair (N/A, 06/12/2012); Hiatal hernia repair; Cholecystectomy (11/02/2012); Upper gi endoscopy (11/02/2012); and Upper gastrointestinal endoscopy.   Her family history includes Breast cancer in her other; Cancer in her mother; Colon cancer in her paternal aunt; Heart disease in her other; Hypertension in her other. There is no history of Esophageal cancer, Rectal cancer, or Stomach cancer.She reports that she has never smoked. She has never used smokeless tobacco. She reports that she does not drink alcohol or use illicit drugs.  Outpatient Prescriptions Prior to Visit  Medication Sig Dispense Refill  . amLODipine (NORVASC) 5 MG tablet Take 1 tablet (5 mg total) by mouth every morning. 90 tablet 3  . Calcium Carbonate-Vitamin D (CALCIUM-VITAMIN D) 500-200 MG-UNIT per tablet Take 1 tablet by mouth 2 (two) times daily with a meal.    . Cholecalciferol 1000 UNITS tablet Take 1,000 Units by mouth daily.     . Cyanocobalamin (VITAMIN B-12 CR PO) Take by mouth daily.    Marland Kitchen MOVIPREP 100 G SOLR Moviprep as directed, no substitutions 1 kit 0  . Multiple Vitamin (MULTIVITAMIN WITH MINERALS) TABS Take 1 tablet by mouth daily.    . nitrofurantoin, macrocrystal-monohydrate, (MACROBID) 100 MG capsule Take 1 capsule (100 mg total) by mouth 2 (two) times daily. 14 capsule 0  . phenazopyridine (PYRIDIUM)  200 MG tablet Take 1 tablet (200 mg total) by mouth 3 (three) times daily as needed for pain. 12 tablet 0  . Probiotic Product (PROBIOTIC DAILY PO) Take by mouth daily.     No facility-administered medications prior to visit.    ROS Review of Systems  Constitutional: Positive for fatigue. Negative for fever, chills and diaphoresis.  HENT: Positive for congestion, postnasal drip, rhinorrhea, sinus pressure, sore throat and voice change. Negative for trouble swallowing.   Respiratory: Negative for cough, chest tightness, shortness of breath and wheezing.   Cardiovascular: Negative for chest pain, palpitations and leg swelling.  Gastrointestinal: Negative for nausea, vomiting and diarrhea.  Skin: Negative for rash.  Neurological: Negative for dizziness and headaches.    Objective:  BP 170/92 mmHg  Pulse 101  Temp(Src) 100.2 F (37.9 C) (Oral)  Ht '5\' 8"'$  (1.727 m)  Wt 162 lb (73.483 kg)  BMI 24.64 kg/m2  SpO2 96%  Physical Exam  Constitutional: She is oriented to person, place, and time. She appears well-developed and well-nourished. No distress.  HENT:  Head: Normocephalic and atraumatic.  Right Ear: External ear normal.  Left Ear: External ear normal.  Mouth/Throat: Oropharynx is clear and moist. No oropharyngeal exudate.  TMs clear bilaterally  Eyes: EOM are normal. Pupils are equal, round, and reactive to light. Right eye exhibits no discharge. Left eye exhibits no discharge. No scleral icterus.  Neck: Normal range of motion. Neck supple.  Cardiovascular: Regular rhythm and normal heart sounds.  Exam reveals no gallop and no friction rub.   No murmur heard. Slightly tachy  today  Pulmonary/Chest: Effort normal and breath sounds normal. No respiratory distress. She has no wheezes. She has no rales. She exhibits no tenderness.  Lymphadenopathy:    She has no cervical adenopathy.  Neurological: She is alert and oriented to person, place, and time. No cranial nerve deficit. She  exhibits normal muscle tone. Coordination normal.  Skin: Skin is warm and dry. No rash noted. She is not diaphoretic.  Psychiatric: She has a normal mood and affect. Her behavior is normal. Judgment and thought content normal.   Assessment & Plan:   Diagnoses and all orders for this visit:  Acute URI  Other orders -     amoxicillin-clavulanate (AUGMENTIN) 875-125 MG tablet; Take 1 tablet by mouth 2 (two) times daily.   I am having Ms. Deist start on amoxicillin-clavulanate. I am also having her maintain her Cholecalciferol, multivitamin with minerals, calcium-vitamin D, amLODipine, Cyanocobalamin (VITAMIN B-12 CR PO), Probiotic Product (PROBIOTIC DAILY PO), MOVIPREP, phenazopyridine, and nitrofurantoin (macrocrystal-monohydrate).  Meds ordered this encounter  Medications  . amoxicillin-clavulanate (AUGMENTIN) 875-125 MG tablet    Sig: Take 1 tablet by mouth 2 (two) times daily.    Dispense:  14 tablet    Refill:  0    Order Specific Question:  Supervising Provider    Answer:  Crecencio Mc [[id number]]     Follow-up: Return if symptoms worsen or fail to improve.

## 2015-06-06 ENCOUNTER — Telehealth: Payer: Self-pay | Admitting: Emergency Medicine

## 2015-06-06 DIAGNOSIS — Z1211 Encounter for screening for malignant neoplasm of colon: Secondary | ICD-10-CM

## 2015-06-06 DIAGNOSIS — J069 Acute upper respiratory infection, unspecified: Secondary | ICD-10-CM | POA: Insufficient documentation

## 2015-06-06 NOTE — Telephone Encounter (Signed)
LVm informing pt.  

## 2015-06-06 NOTE — Telephone Encounter (Signed)
ordered

## 2015-06-06 NOTE — Assessment & Plan Note (Signed)
New Onset Due to length of symptoms with worsening will treat empirically  Augmentin was sent to the pharmacy Encouraged Probiotics Continue OTC measures  FU prn worsening/failure to improve.

## 2015-06-06 NOTE — Telephone Encounter (Signed)
Pt called in and is requesting orders be placed for Colonoscopy.

## 2015-06-07 ENCOUNTER — Encounter: Payer: Self-pay | Admitting: Gastroenterology

## 2015-07-04 ENCOUNTER — Ambulatory Visit (INDEPENDENT_AMBULATORY_CARE_PROVIDER_SITE_OTHER): Payer: Medicare Other | Admitting: Internal Medicine

## 2015-07-04 ENCOUNTER — Encounter: Payer: Self-pay | Admitting: Internal Medicine

## 2015-07-04 VITALS — BP 140/72 | HR 59 | Temp 97.7°F | Resp 16 | Wt 158.0 lb

## 2015-07-04 DIAGNOSIS — Z1159 Encounter for screening for other viral diseases: Secondary | ICD-10-CM

## 2015-07-04 DIAGNOSIS — I10 Essential (primary) hypertension: Secondary | ICD-10-CM | POA: Diagnosis not present

## 2015-07-04 MED ORDER — AMLODIPINE BESYLATE 5 MG PO TABS: 5.0000 mg | ORAL_TABLET | Freq: Every morning | ORAL | Status: DC

## 2015-07-04 NOTE — Assessment & Plan Note (Addendum)
BP well controlled Current regimen effective and well tolerated Continue current medications at current doses Check cmp and other basic blood work Continue monitor BP at home

## 2015-07-04 NOTE — Progress Notes (Signed)
Subjective:    Patient ID: Sara [name], female    DOB: [birth year], 70 y.o.   MRN: [id number]  HPI She is here to establish with a new pcp.  Her BP was elevated when she was here last - it was for a sick visit.   Hypertension: She is taking her medication daily. She is compliant with a low sodium diet.  She denies chest pain, palpitations, edema, shortness of breath and regular headaches. She is exercising -walking.  She does monitor her blood pressure at home 135-140/78-80.      She has no other concerns.   Medications and allergies reviewed with patient and updated if appropriate.  Patient Active Problem List   Diagnosis Date Noted  . S/P laparoscopic cholecystectomy-for advanced necrotic gallbladder August 2014 11/05/2012  . S/P vaginal hysterectomy 06/13/2012  . DEPRESSION, SITUATIONAL 02/20/2009  . GERD 02/20/2009  . RENAL CALCULUS, HX OF 02/20/2009  . CHICKENPOX, HX OF 02/20/2009  . HYPERTENSION 02/14/2009  . HIATAL HERNIA 02/14/2009  . SYNCOPE 02/05/2009    Current Outpatient Prescriptions on File Prior to Visit  Medication Sig Dispense Refill  . amLODipine (NORVASC) 5 MG tablet Take 1 tablet (5 mg total) by mouth every morning. 90 tablet 3  . Calcium Carbonate-Vitamin D (CALCIUM-VITAMIN D) 500-200 MG-UNIT per tablet Take 1 tablet by mouth 2 (two) times daily with a meal.    . Cholecalciferol 1000 UNITS tablet Take 1,000 Units by mouth daily.     . Cyanocobalamin (VITAMIN B-12 CR PO) Take by mouth daily.    Marland Kitchen MOVIPREP 100 G SOLR Moviprep as directed, no substitutions 1 kit 0  . Multiple Vitamin (MULTIVITAMIN WITH MINERALS) TABS Take 1 tablet by mouth daily.    . Probiotic Product (PROBIOTIC DAILY PO) Take by mouth daily.     No current facility-administered medications on file prior to visit.    Past Medical History  Diagnosis Date  . HIATAL HERNIA     s/p surgical repair  . HYPERTENSION   . Kidney stone     1990s  . GERD (gastroesophageal reflux  disease)     hx of   . Barrett's esophagus     Past Surgical History  Procedure Laterality Date  . Tonsillectomy  1968  . Cataract extraction  2009    both eyes  . Lithotripsy    . Vaginal hysterectomy N/A 06/12/2012    Procedure: HYSTERECTOMY VAGINAL;  Surgeon: Elveria Royals, MD;  Location: [facility];  Service: Gynecology;  Laterality: N/A;  . Cystocele repair N/A 06/12/2012    Procedure: ANTERIOR REPAIR (CYSTOCELE);  Surgeon: Elveria Royals, MD;  Location: [facility];  Service: Gynecology;  Laterality: N/A;  . Hiatal hernia repair    . Cholecystectomy  11/02/2012    Procedure: CHOLECYSTECTOMY;  Surgeon: Pedro Earls, MD;  Location: [facility];  Service: General;;  . Upper gi endoscopy  11/02/2012    Procedure: UPPER GI ENDOSCOPY;  Surgeon: Pedro Earls, MD;  Location: [facility];  Service: General;;  . Upper gastrointestinal endoscopy      Social History   Social History  . Marital Status: Widowed    Spouse Name: N/A  . Number of Children: N/A  . Years of Education: 14   Occupational History  . Retired    Social History Main Topics  . Smoking status: Never Smoker   . Smokeless tobacco: Never Used     Comment: widowed since late 2011 (spouse had mod-adv dementia) 5 grown  children, 3 nearby-,many g-kids  . Alcohol Use: No  . Drug Use: No  . Sexual Activity: Yes    Birth Control/ Protection: Post-menopausal   Other Topics Concern  . None   Social History Narrative    Family History  Problem Relation Age of Onset  . Cancer Mother   . Hypertension Other     Parent  . Breast cancer Other   . Heart disease Other     parent, grandparent  . Colon cancer Paternal Aunt   . Esophageal cancer Neg Hx   . Rectal cancer Neg Hx   . Stomach cancer Neg Hx     Review of Systems  Constitutional: Negative for fever and chills.  Respiratory: Positive for cough (allergies). Negative for shortness of breath and wheezing.   Cardiovascular: Positive for leg swelling (sometimes in  summer). Negative for chest pain and palpitations.  Neurological: Negative for dizziness, light-headedness and headaches.       Objective:   Filed Vitals:   07/04/15 0828  BP: 140/72  Pulse: 59  Temp: 97.7 F (36.5 C)  Resp: 16   Filed Weights   07/04/15 0828  Weight: 158 lb (71.668 kg)   Body mass index is 24.03 kg/(m^2).   Physical Exam Constitutional: Appears well-developed and well-nourished. No distress.  Neck: Neck supple. No tracheal deviation present. No thyromegaly present.  No carotid bruit. No cervical adenopathy.   Cardiovascular: Normal rate, regular rhythm and normal heart sounds.   No murmur heard.  No edema Pulmonary/Chest: Effort normal and breath sounds normal. No respiratory distress. No wheezes.       Assessment & Plan:   See Problem List for Assessment and Plan of chronic medical problems.  Follow up annually

## 2015-07-04 NOTE — Progress Notes (Signed)
Pre visit review using our clinic review tool, if applicable. No additional management support is needed unless otherwise documented below in the visit note. 

## 2015-07-04 NOTE — Patient Instructions (Addendum)
  Test(s) ordered today. Your results will be released to MyChart (or called to you) after review, usually within 72hours after test completion. If any changes need to be made, you will be notified at that same time.  All other Health Maintenance issues reviewed.   All recommended immunizations and age-appropriate screenings are up-to-date or discussed.  No immunizations administered today.   Medications reviewed and updated.  No changes recommended at this time.  Your prescription(s) have been submitted to your pharmacy. Please take as directed and contact our office if you believe you are having problem(s) with the medication(s).   Please followup in one year   

## 2015-07-24 ENCOUNTER — Encounter: Payer: Medicare Other | Admitting: Gastroenterology

## 2015-08-24 ENCOUNTER — Encounter: Payer: Self-pay | Admitting: Gastroenterology

## 2015-09-11 ENCOUNTER — Encounter: Payer: Medicare Other | Admitting: Gastroenterology

## 2015-10-05 ENCOUNTER — Ambulatory Visit: Payer: Medicare Other | Admitting: Family

## 2015-10-06 ENCOUNTER — Other Ambulatory Visit: Payer: Medicare Other

## 2015-10-06 ENCOUNTER — Encounter: Payer: Self-pay | Admitting: Internal Medicine

## 2015-10-06 ENCOUNTER — Ambulatory Visit (INDEPENDENT_AMBULATORY_CARE_PROVIDER_SITE_OTHER): Payer: Medicare Other | Admitting: Internal Medicine

## 2015-10-06 VITALS — BP 138/78 | HR 81 | Temp 98.4°F | Resp 20 | Wt 163.0 lb

## 2015-10-06 DIAGNOSIS — M545 Low back pain, unspecified: Secondary | ICD-10-CM

## 2015-10-06 DIAGNOSIS — N390 Urinary tract infection, site not specified: Secondary | ICD-10-CM | POA: Insufficient documentation

## 2015-10-06 DIAGNOSIS — R8290 Unspecified abnormal findings in urine: Secondary | ICD-10-CM

## 2015-10-06 DIAGNOSIS — I10 Essential (primary) hypertension: Secondary | ICD-10-CM | POA: Diagnosis not present

## 2015-10-06 DIAGNOSIS — R300 Dysuria: Secondary | ICD-10-CM

## 2015-10-06 LAB — POCT URINALYSIS DIPSTICK
BILIRUBIN UA: NEGATIVE
GLUCOSE UA: NEGATIVE
Ketones, UA: NEGATIVE
Leukocytes, UA: NEGATIVE
Nitrite, UA: NEGATIVE
Protein, UA: NEGATIVE
RBC UA: NEGATIVE
Spec Grav, UA: 1.02
UROBILINOGEN UA: NEGATIVE
pH, UA: 6

## 2015-10-06 NOTE — Patient Instructions (Signed)
It does not appear you have a UTI based on your exam today  The specimen will be sent for culture, however, and an antibiotic should be sent (and you should be notified) by Monday if the culture returns as significantly abnormal  Please continue all other medications as before, and refills have been done if requested.  Please have the pharmacy call with any other refills you may need.  Please keep your appointments with your specialists as you may have planned

## 2015-10-06 NOTE — Assessment & Plan Note (Signed)
?   Cloudy per pt, udip neg, for urine cx, and tx pending results

## 2015-10-06 NOTE — Assessment & Plan Note (Signed)
stable overall by history and exam, recent data reviewed with pt, and pt to continue medical treatment as before,  to f/u any worsening symptoms or concerns BP Readings from Last 3 Encounters:  10/06/15 138/78  07/04/15 140/72  06/02/15 170/92

## 2015-10-06 NOTE — Progress Notes (Signed)
Subjective:    Patient ID: Sara [name], female    DOB: [birth year], 70 y.o.   MRN: [id number]  HPI  Here to f/u, wondering about another UTI similar to that she had in aug 2016; came in early due to primarily an aching of the lower back intermittent, mild, without radicular symptoms or gait change, also noted some ? Cloudy urine.  Leaving for [place] next tues for several days, just trying to be treated if needed prior to the trip.  Denies urinary symptoms such as dysuria, frequency, urgency, flank pain, hematuria or n/v, fever, chills. Pt denies fever, wt loss, night sweats, loss of appetite, or other constitutional symptoms   Past Medical History  Diagnosis Date  . HIATAL HERNIA     s/p surgical repair  . HYPERTENSION   . Kidney stone     1990s  . GERD (gastroesophageal reflux disease)     hx of   . Barrett's esophagus    Past Surgical History  Procedure Laterality Date  . Tonsillectomy  1968  . Cataract extraction  2009    both eyes  . Lithotripsy    . Vaginal hysterectomy N/A 06/12/2012    Procedure: HYSTERECTOMY VAGINAL;  Surgeon: Elveria Royals, MD;  Location: [facility];  Service: Gynecology;  Laterality: N/A;  . Cystocele repair N/A 06/12/2012    Procedure: ANTERIOR REPAIR (CYSTOCELE);  Surgeon: Elveria Royals, MD;  Location: [facility];  Service: Gynecology;  Laterality: N/A;  . Hiatal hernia repair    . Cholecystectomy  11/02/2012    Procedure: CHOLECYSTECTOMY;  Surgeon: Pedro Earls, MD;  Location: [facility];  Service: General;;  . Upper gi endoscopy  11/02/2012    Procedure: UPPER GI ENDOSCOPY;  Surgeon: Pedro Earls, MD;  Location: [facility];  Service: General;;  . Upper gastrointestinal endoscopy      reports that she has never smoked. She has never used smokeless tobacco. She reports that she does not drink alcohol or use illicit drugs. family history includes Breast cancer in her other; Cancer in her mother; Colon cancer in her paternal aunt; Heart disease in her  other; Hypertension in her other. There is no history of Esophageal cancer, Rectal cancer, or Stomach cancer. No Known Allergies Current Outpatient Prescriptions on File Prior to Visit  Medication Sig Dispense Refill  . amLODipine (NORVASC) 5 MG tablet Take 1 tablet (5 mg total) by mouth every morning. 90 tablet 3  . Calcium Carbonate-Vitamin D (CALCIUM-VITAMIN D) 500-200 MG-UNIT per tablet Take 1 tablet by mouth 2 (two) times daily with a meal.    . Cholecalciferol 1000 UNITS tablet Take 1,000 Units by mouth daily.     . Cyanocobalamin (VITAMIN B-12 CR PO) Take by mouth daily.    Marland Kitchen MOVIPREP 100 G SOLR Moviprep as directed, no substitutions 1 kit 0  . Multiple Vitamin (MULTIVITAMIN WITH MINERALS) TABS Take 1 tablet by mouth daily.    . Probiotic Product (PROBIOTIC DAILY PO) Take by mouth daily.     No current facility-administered medications on file prior to visit.   Review of Systems All otherwise neg per pt     Objective:   Physical Exam BP 138/78 mmHg  Pulse 81  Temp(Src) 98.4 F (36.9 C) (Oral)  Resp 20  Wt 163 lb (73.936 kg)  SpO2 95% VS noted, not ill appearing Constitutional: Pt appears in no apparent distress HENT: Head: NCAT.  Right Ear: External ear normal.  Left Ear: External ear normal.  Eyes: .  Pupils are equal, round, and reactive to light. Conjunctivae and EOM are normal Neck: Normal range of motion. Neck supple.  Cardiovascular: Normal rate and regular rhythm.   Pulmonary/Chest: Effort normal and breath sounds without rales or wheezing.  Abd:  Soft, NT, ND, + BS, no flank tender Neurological: Pt is alert. Not confused , motor grossly intact Skin: Skin is warm. No rash, no LE edema Psychiatric: Pt behavior is normal. No agitation.   POCT Urinalysis Dipstick  Status: Finalresult Visible to patient:  Not Released Dx:  Dysuria   Normal           Ref Range 11:31 AM  62moago  117yrgo  [id number]     Color, UA  yellow yellow Yellow light  yellow    Clarity, UA  cloudy cloudy Cloudy cloudy    Glucose, UA  negative neg Neg negative    Bilirubin, UA  negative neg Neg negative    Ketones, UA  negative neg Neg negative    Spec Grav, UA  1.020 1.015 1.010 1.015    Blood, UA  negative neg Trace large    pH, UA  6.0 6.5 6.0 5.0    Protein, UA  negative neg Neg negative    Urobilinogen, UA  negative 0.2 2.0 0.2    Nitrite, UA  negative neg Positive negative    Leukocytes, UA Negative  Negative               Assessment & Plan:

## 2015-10-06 NOTE — Assessment & Plan Note (Signed)
Etiology unclear, exam benign, for tylenol prn, asympt now, for urine studies as well as above

## 2015-10-07 LAB — CULTURE, URINE COMPREHENSIVE

## 2015-11-20 ENCOUNTER — Encounter: Payer: Medicare Other | Admitting: Gastroenterology

## 2015-12-12 ENCOUNTER — Other Ambulatory Visit (INDEPENDENT_AMBULATORY_CARE_PROVIDER_SITE_OTHER): Payer: Medicare Other

## 2015-12-12 DIAGNOSIS — I10 Essential (primary) hypertension: Secondary | ICD-10-CM | POA: Diagnosis not present

## 2015-12-12 LAB — COMPREHENSIVE METABOLIC PANEL
ALK PHOS: 70 U/L (ref 39–117)
ALT: 10 U/L (ref 0–35)
AST: 14 U/L (ref 0–37)
Albumin: 3.7 g/dL (ref 3.5–5.2)
BUN: 32 mg/dL — AB (ref 6–23)
CHLORIDE: 105 meq/L (ref 96–112)
CO2: 29 meq/L (ref 19–32)
Calcium: 9.2 mg/dL (ref 8.4–10.5)
Creatinine, Ser: 1.1 mg/dL (ref 0.40–1.20)
GFR: 52.44 mL/min — ABNORMAL LOW (ref >60.00)
GLUCOSE: 90 mg/dL (ref 70–99)
POTASSIUM: 4.5 meq/L (ref 3.5–5.1)
SODIUM: 140 meq/L (ref 135–145)
TOTAL PROTEIN: 6.7 g/dL (ref 6.0–8.3)
Total Bilirubin: 0.5 mg/dL (ref 0.2–1.2)

## 2015-12-12 LAB — LIPID PANEL
CHOL/HDL RATIO: 3
Cholesterol: 162 mg/dL (ref 0–200)
HDL: 48 mg/dL (ref >39.00)
LDL CALC: 93 mg/dL (ref 0–99)
NONHDL: 113.73
Triglycerides: 105 mg/dL (ref 0.0–149.0)
VLDL: 21 mg/dL (ref 0.0–40.0)

## 2015-12-12 LAB — CBC WITH DIFFERENTIAL/PLATELET
BASOS PCT: 0.4 % (ref 0.0–3.0)
Basophils Absolute: 0 10*3/uL (ref 0.0–0.1)
EOS ABS: 0.4 10*3/uL (ref 0.0–0.7)
EOS PCT: 6 % — AB (ref 0.0–5.0)
HEMATOCRIT: 42 % (ref 36.0–46.0)
Hemoglobin: 14.4 g/dL (ref 12.0–15.0)
LYMPHS PCT: 27 % (ref 12.0–46.0)
Lymphs Abs: 1.9 10*3/uL (ref 0.7–4.0)
MCHC: 34.3 g/dL (ref 30.0–36.0)
MCV: 89.9 fl (ref 78.0–100.0)
MONOS PCT: 7.3 % (ref 3.0–12.0)
Monocytes Absolute: 0.5 10*3/uL (ref 0.1–1.0)
NEUTROS ABS: 4.1 10*3/uL (ref 1.4–7.7)
Neutrophils Relative %: 59.3 % (ref 43.0–77.0)
PLATELETS: 265 10*3/uL (ref 150.0–400.0)
RBC: 4.68 Mil/uL (ref 3.87–5.11)
RDW: 14.2 % (ref 11.5–15.5)
WBC: 7 10*3/uL (ref 4.0–10.5)

## 2015-12-12 LAB — TSH: TSH: 0.84 u[IU]/mL (ref 0.35–4.50)

## 2015-12-16 ENCOUNTER — Encounter: Payer: Self-pay | Admitting: Internal Medicine

## 2015-12-16 DIAGNOSIS — N183 Chronic kidney disease, stage 3 unspecified: Secondary | ICD-10-CM | POA: Insufficient documentation

## 2015-12-20 ENCOUNTER — Ambulatory Visit (INDEPENDENT_AMBULATORY_CARE_PROVIDER_SITE_OTHER): Payer: Medicare Other | Admitting: Internal Medicine

## 2015-12-20 ENCOUNTER — Encounter: Payer: Self-pay | Admitting: Internal Medicine

## 2015-12-20 VITALS — BP 152/82 | HR 93 | Temp 99.5°F | Resp 16 | Ht 68.5 in | Wt 168.0 lb

## 2015-12-20 DIAGNOSIS — R300 Dysuria: Secondary | ICD-10-CM

## 2015-12-20 DIAGNOSIS — N3000 Acute cystitis without hematuria: Secondary | ICD-10-CM

## 2015-12-20 LAB — POCT URINALYSIS DIPSTICK
Bilirubin, UA: NEGATIVE
Glucose, UA: NEGATIVE
NITRITE UA: POSITIVE
PH UA: 6
Spec Grav, UA: >=1.03
UROBILINOGEN UA: NEGATIVE

## 2015-12-20 MED ORDER — CIPROFLOXACIN HCL 500 MG PO TABS: 500.0000 mg | ORAL_TABLET | Freq: Two times a day (BID) | ORAL | 0 refills | Status: DC

## 2015-12-20 NOTE — Progress Notes (Signed)
Pre visit review using our clinic review tool, if applicable. No additional management support is needed unless otherwise documented below in the visit note. 

## 2015-12-20 NOTE — Patient Instructions (Signed)
You do have signs of infection in the urine.   We have sent in ciprofloxacin for the infection. Take 1 pill twice a day for 5 days to clear the infection.

## 2015-12-20 NOTE — Progress Notes (Signed)
   Subjective:    Patient ID: Sara [name], female    DOB: June 08, 1945, 70 y.o.   MRN: [id number]  HPI The patient is a 70 YO female coming in for possible UTI, she is having fevers and frequency the last 2 days. She is also having some pressure with urination and some mild pain in the mid back during this time. Has not tried anything for it. Overall it is worsening.   Review of Systems  Constitutional: Positive for fever. Negative for activity change, appetite change, chills and unexpected weight change.  Respiratory: Negative.   Cardiovascular: Negative.   Gastrointestinal: Negative.   Genitourinary: Positive for dysuria, flank pain, frequency and urgency. Negative for difficulty urinating.  Skin: Negative.       Objective:   Physical Exam  Constitutional: She is oriented to person, place, and time. She appears well-developed and well-nourished.  HENT:  Head: Normocephalic and atraumatic.  Eyes: EOM are normal.  Cardiovascular: Normal rate and regular rhythm.   Pulmonary/Chest: Effort normal and breath sounds normal.  Abdominal: Soft. Bowel sounds are normal.  Mild suprapubic tenderness, mild flank discomfort  Musculoskeletal: She exhibits no edema.  Neurological: She is alert and oriented to person, place, and time.   Vitals:   12/20/15 1111  BP: (!) 152/82  Pulse: 93  Resp: 16  Temp: 99.5 F (37.5 C)  TempSrc: Oral  SpO2: 98%  Weight: 168 lb (76.2 kg)  Height: 5' 8.5" (1.74 m)      Assessment & Plan:

## 2015-12-21 NOTE — Assessment & Plan Note (Signed)
U/A with signs of infection. Given the mild flank pain will treat with ciprofloxacin 5 days.

## 2015-12-22 ENCOUNTER — Telehealth: Payer: Self-pay | Admitting: Internal Medicine

## 2015-12-22 MED ORDER — SULFAMETHOXAZOLE-TRIMETHOPRIM 800-160 MG PO TABS: 1.0000 | ORAL_TABLET | Freq: Two times a day (BID) | ORAL | 0 refills | Status: DC

## 2015-12-22 NOTE — Telephone Encounter (Signed)
Pt called stating that she took Cipro to help with uti, but this med making her nausea and diarrhea. Please advise,

## 2015-12-22 NOTE — Telephone Encounter (Signed)
Sent in bactrim 2 day supply.

## 2015-12-22 NOTE — Telephone Encounter (Signed)
Patient said she only took four doses. Please send in something else.

## 2015-12-22 NOTE — Telephone Encounter (Signed)
How many doses has she taken? If 5 then she can just stop. Symptoms should resolve. If not 5 forward back to me and can rx something else for another 1-2 days of treatment.

## 2015-12-25 NOTE — Telephone Encounter (Signed)
Patient took the medication and she says she thinks the infection is not all the way cleared up yet. She is going to drink plenty of water and will call us if she is still having problems.

## 2016-02-29 ENCOUNTER — Telehealth: Payer: Self-pay | Admitting: Internal Medicine

## 2016-02-29 NOTE — Telephone Encounter (Signed)
Spoke with patient regarding annual wellness appt. Pt stated that she will call office back to schedule appt.

## 2016-04-11 ENCOUNTER — Telehealth: Payer: Medicare Other | Admitting: Physician Assistant

## 2016-04-11 DIAGNOSIS — B9789 Other viral agents as the cause of diseases classified elsewhere: Secondary | ICD-10-CM

## 2016-04-11 DIAGNOSIS — J329 Chronic sinusitis, unspecified: Secondary | ICD-10-CM

## 2016-04-11 MED ORDER — FLUTICASONE PROPIONATE 50 MCG/ACT NA SUSP: 2.0000 | Freq: Every day | NASAL | 6 refills | Status: DC

## 2016-04-11 NOTE — Progress Notes (Signed)

## 2016-05-10 ENCOUNTER — Encounter: Payer: Self-pay | Admitting: Internal Medicine

## 2016-05-10 ENCOUNTER — Ambulatory Visit (INDEPENDENT_AMBULATORY_CARE_PROVIDER_SITE_OTHER): Payer: Medicare Other | Admitting: Internal Medicine

## 2016-05-10 VITALS — BP 142/78 | HR 84 | Temp 98.5°F | Ht 68.5 in | Wt 169.8 lb

## 2016-05-10 DIAGNOSIS — R0789 Other chest pain: Secondary | ICD-10-CM | POA: Diagnosis not present

## 2016-05-10 DIAGNOSIS — Z23 Encounter for immunization: Secondary | ICD-10-CM | POA: Diagnosis not present

## 2016-05-10 DIAGNOSIS — R300 Dysuria: Secondary | ICD-10-CM

## 2016-05-10 LAB — POC URINALSYSI DIPSTICK (AUTOMATED)
BILIRUBIN UA: NEGATIVE
GLUCOSE UA: NEGATIVE
KETONES UA: NEGATIVE
Nitrite, UA: NEGATIVE
PH UA: 5
Protein, UA: NEGATIVE
RBC UA: NEGATIVE
Spec Grav, UA: >=1.03
Urobilinogen, UA: 0.2

## 2016-05-10 MED ORDER — SULFAMETHOXAZOLE-TRIMETHOPRIM 800-160 MG PO TABS: 1.0000 | ORAL_TABLET | Freq: Two times a day (BID) | ORAL | 0 refills | Status: DC

## 2016-05-10 NOTE — Progress Notes (Signed)
Pre visit review using our clinic review tool, if applicable. No additional management support is needed unless otherwise documented below in the visit note. 

## 2016-05-10 NOTE — Assessment & Plan Note (Signed)
Suspect related to UTI. EKG without changes. Will treat UTI and if symptoms recurrent stress test is reasonable.

## 2016-05-10 NOTE — Patient Instructions (Addendum)
We have sent in the bactrim for the bladder infection. Take 1 pill twice a day for 3 days.   We have done the EKG and it looks the same as before.   We have given you the flu and pneumonia shot today.

## 2016-05-10 NOTE — Progress Notes (Signed)
   Subjective:    Patient ID: Sara [name], female    DOB: 08/25/1945, 71 y.o.   MRN: [id number]  HPI The patient is a 71 YO female coming in for mid back pain and cloudy urine. She has had kidney infection in the past and this feels similar. She is also having frequency and urgency since Monday. Overall stable since onset. Tried taking azo and this helped slightly. She is slightly concerned because the pain in the left mid back is coming around the flank and she is worried about the heart. She does have significant family history for cardiac disease.   She denies that the pain in her chest worsens with activity and has been relieved with ibuprofen. She has several family members that died of heart attack in 64s-60. She is able to walk a flight of stairs without symptoms. No diaphoresis.   Review of Systems  Constitutional: Negative for activity change, appetite change, fatigue, fever and unexpected weight change.  Respiratory: Negative.   Cardiovascular: Negative.   Gastrointestinal: Positive for abdominal distention.  Genitourinary: Positive for dysuria and frequency.  Musculoskeletal: Positive for back pain. Negative for arthralgias, gait problem, joint swelling and myalgias.  Skin: Negative.       Objective:   Physical Exam  Constitutional: She is oriented to person, place, and time. She appears well-developed and well-nourished.  HENT:  Head: Normocephalic and atraumatic.  Eyes: EOM are normal.  Neck: Normal range of motion.  Cardiovascular: Normal rate and regular rhythm.   Pulmonary/Chest: Effort normal and breath sounds normal.  Abdominal: Soft. Bowel sounds are normal. She exhibits no distension. There is no tenderness. There is no rebound.  Musculoskeletal: She exhibits no tenderness.  Neurological: She is alert and oriented to person, place, and time.  Skin: Skin is warm and dry.   Vitals:   05/10/16 0810  BP: (!) 142/78  Pulse: 84  Temp: 98.5 F (36.9 C)    TempSrc: Oral  SpO2: 99%  Weight: 169 lb 12 oz (77 kg)  Height: 5' 8.5" (1.74 m)   EKG: Rate 76, axis normal, intervals normal, no st or t wave changes, no change from 2014 EKG    Assessment & Plan:  Flu and and pneumonia 23 given at visit.

## 2016-05-13 ENCOUNTER — Telehealth: Payer: Self-pay | Admitting: Internal Medicine

## 2016-05-13 ENCOUNTER — Other Ambulatory Visit: Payer: Self-pay | Admitting: Internal Medicine

## 2016-05-13 DIAGNOSIS — Z1231 Encounter for screening mammogram for malignant neoplasm of breast: Secondary | ICD-10-CM

## 2016-05-13 MED ORDER — SULFAMETHOXAZOLE-TRIMETHOPRIM 800-160 MG PO TABS: 1.0000 | ORAL_TABLET | Freq: Two times a day (BID) | ORAL | 0 refills | Status: DC

## 2016-05-13 NOTE — Telephone Encounter (Signed)
Yes Dr. Quay Burow, please enjoy vacation. Watt Climes that is why I sent this to you. Please just talk with Dr. Sharlet Salina about her. Dr. Sharlet Salina is the one who told her if no better to call back. Thank you.

## 2016-05-13 NOTE — Telephone Encounter (Signed)
Patient states she is still not feeling any better since seeing you. She states you wanted her to call back if no better. She wants to know what she can do next. Please advise. Thank you.

## 2016-05-13 NOTE — Telephone Encounter (Signed)
She saw Dr Sharlet Salina last week.  I last saw her 06/2015.  Find out what symptoms she is still having and consult with Dr Sharlet Salina please since I am out of the office.

## 2016-05-13 NOTE — Telephone Encounter (Signed)
Accidentally sent to Dr.Burns, couldn't get it back about I sent it. sorry

## 2016-05-13 NOTE — Telephone Encounter (Signed)
Patient contacted and stated awareness 

## 2016-05-13 NOTE — Telephone Encounter (Signed)
Have sent in another 4 days of bactrim for her and if still symptoms afterwards will have her bring in another sample.

## 2016-05-17 ENCOUNTER — Encounter: Payer: Self-pay | Admitting: Internal Medicine

## 2016-05-17 ENCOUNTER — Other Ambulatory Visit (INDEPENDENT_AMBULATORY_CARE_PROVIDER_SITE_OTHER): Payer: Medicare Other

## 2016-05-17 ENCOUNTER — Ambulatory Visit (INDEPENDENT_AMBULATORY_CARE_PROVIDER_SITE_OTHER): Payer: Medicare Other | Admitting: Internal Medicine

## 2016-05-17 VITALS — BP 160/86 | HR 86 | Temp 98.2°F | Ht 68.5 in | Wt 167.0 lb

## 2016-05-17 DIAGNOSIS — R1013 Epigastric pain: Secondary | ICD-10-CM

## 2016-05-17 LAB — COMPREHENSIVE METABOLIC PANEL
ALT: 12 U/L (ref 0–35)
AST: 18 U/L (ref 0–37)
Albumin: 4 g/dL (ref 3.5–5.2)
Alkaline Phosphatase: 74 U/L (ref 39–117)
BUN: 25 mg/dL — ABNORMAL HIGH (ref 6–23)
CALCIUM: 10.7 mg/dL — AB (ref 8.4–10.5)
CHLORIDE: 99 meq/L (ref 96–112)
CO2: 29 meq/L (ref 19–32)
Creatinine, Ser: 2.1 mg/dL — ABNORMAL HIGH (ref 0.40–1.20)
GFR: 24.83 mL/min — AB (ref >60.00)
GLUCOSE: 97 mg/dL (ref 70–99)
Potassium: 5.1 mEq/L (ref 3.5–5.1)
Sodium: 134 mEq/L — ABNORMAL LOW (ref 135–145)
Total Bilirubin: 0.3 mg/dL (ref 0.2–1.2)
Total Protein: 7 g/dL (ref 6.0–8.3)

## 2016-05-17 LAB — CBC
HCT: 40.2 % (ref 36.0–46.0)
HEMOGLOBIN: 13.4 g/dL (ref 12.0–15.0)
MCHC: 33.2 g/dL (ref 30.0–36.0)
MCV: 91.5 fl (ref 78.0–100.0)
Platelets: 378 10*3/uL (ref 150.0–400.0)
RBC: 4.4 Mil/uL (ref 3.87–5.11)
RDW: 12.7 % (ref 11.5–15.5)
WBC: 12.5 10*3/uL — ABNORMAL HIGH (ref 4.0–10.5)

## 2016-05-17 LAB — LIPASE: Lipase: 18 U/L (ref 11.0–59.0)

## 2016-05-17 MED ORDER — PANTOPRAZOLE SODIUM 40 MG PO TBEC: 40.0000 mg | DELAYED_RELEASE_TABLET | Freq: Every day | ORAL | 3 refills | Status: DC

## 2016-05-17 NOTE — Progress Notes (Signed)
   Subjective:    Patient ID: Sara [name], female    DOB: 06-Jan-1946, 72 y.o.   MRN: [id number]  HPI The patient is a 71 YO female coming in for follow up of her recent uti. She has finished the antibiotics and her urinary symptoms are gone. She is still having some upper back pain. Starts in the epigastric region and goes around the left flank. She is having it daily and mostly starts after eating but can come other times. She has tried ibuprofen or tylenol and they help for a certain amount of time. She denies fevers or chills. No nausea or vomiting. No urinary symptoms. No diarrhea or constipation. She has not tried tums or other to see if they would help. Overall is stable since onset.   Review of Systems  Constitutional: Negative for activity change, appetite change, chills, fatigue and fever.  HENT: Negative.   Eyes: Negative.   Respiratory: Negative.   Cardiovascular: Negative.   Gastrointestinal: Positive for abdominal pain. Negative for abdominal distention, anal bleeding, blood in stool, constipation, diarrhea, nausea and vomiting.  Musculoskeletal: Positive for back pain. Negative for arthralgias, gait problem, joint swelling and neck stiffness.  Skin: Negative.   Neurological: Negative.       Objective:   Physical Exam  Constitutional: She is oriented to person, place, and time. She appears well-developed and well-nourished.  HENT:  Head: Normocephalic and atraumatic.  Eyes: EOM are normal.  Neck: Normal range of motion.  Cardiovascular: Normal rate and regular rhythm.   Pulmonary/Chest: Effort normal and breath sounds normal.  Abdominal: Soft. Bowel sounds are normal. She exhibits no distension and no mass. There is tenderness. There is no rebound and no guarding.  Mild tenderness in the epigastric region with some radiation into the flank  Neurological: She is alert and oriented to person, place, and time.  Skin: Skin is warm and dry.   Vitals:   05/17/16 1615    BP: (!) 160/86  Pulse: 86  Temp: 98.2 F (36.8 C)  TempSrc: Oral  SpO2: 99%  Weight: 167 lb (75.8 kg)  Height: 5' 8.5" (1.74 m)       Assessment & Plan:

## 2016-05-17 NOTE — Patient Instructions (Signed)
We have sent in the medicine to try once a day called protonix, give it until Monday and see if this helps.   If it helps take it for 2-4 weeks and then try coming off it.   If it does not help call back on Monday and we will get an imaging test of the stomach.

## 2016-05-17 NOTE — Progress Notes (Signed)
Pre visit review using our clinic review tool, if applicable. No additional management support is needed unless otherwise documented below in the visit note. 

## 2016-05-19 ENCOUNTER — Encounter: Payer: Self-pay | Admitting: Internal Medicine

## 2016-05-19 ENCOUNTER — Other Ambulatory Visit: Payer: Self-pay | Admitting: Internal Medicine

## 2016-05-19 DIAGNOSIS — R1013 Epigastric pain: Secondary | ICD-10-CM | POA: Insufficient documentation

## 2016-05-19 DIAGNOSIS — R7989 Other specified abnormal findings of blood chemistry: Secondary | ICD-10-CM

## 2016-05-19 NOTE — Assessment & Plan Note (Signed)
Gallbladder previously removed due to necrotic. Most likely GERD related symptoms although this could represent pancreatitis. Checking CBC, CMP, lipase to rule out metabolic etiology. Rx for protonix and if no relief in several days will order CT abdomen.

## 2016-05-20 ENCOUNTER — Other Ambulatory Visit (INDEPENDENT_AMBULATORY_CARE_PROVIDER_SITE_OTHER): Payer: Medicare Other

## 2016-05-20 DIAGNOSIS — R7989 Other specified abnormal findings of blood chemistry: Secondary | ICD-10-CM

## 2016-05-20 LAB — BASIC METABOLIC PANEL
BUN: 28 mg/dL — AB (ref 6–23)
CHLORIDE: 104 meq/L (ref 96–112)
CO2: 26 meq/L (ref 19–32)
CREATININE: 1.41 mg/dL — AB (ref 0.40–1.20)
Calcium: 9.7 mg/dL (ref 8.4–10.5)
GFR: 39.33 mL/min — ABNORMAL LOW (ref >60.00)
Glucose, Bld: 97 mg/dL (ref 70–99)
POTASSIUM: 4.7 meq/L (ref 3.5–5.1)
Sodium: 140 mEq/L (ref 135–145)

## 2016-05-27 ENCOUNTER — Encounter: Payer: Self-pay | Admitting: Internal Medicine

## 2016-05-27 DIAGNOSIS — R1012 Left upper quadrant pain: Secondary | ICD-10-CM

## 2016-06-03 ENCOUNTER — Ambulatory Visit
Admission: RE | Admit: 2016-06-03 | Discharge: 2016-06-03 | Disposition: A | Discharge status: Home / Self Care | Payer: Medicare Other | Source: Ambulatory Visit | Attending: Internal Medicine | Admitting: Internal Medicine

## 2016-06-03 DIAGNOSIS — R1012 Left upper quadrant pain: Secondary | ICD-10-CM

## 2016-06-05 ENCOUNTER — Ambulatory Visit
Admission: RE | Admit: 2016-06-05 | Discharge: 2016-06-05 | Disposition: A | Discharge status: Home / Self Care | Payer: Medicare Other | Source: Ambulatory Visit | Attending: Internal Medicine | Admitting: Internal Medicine

## 2016-06-05 DIAGNOSIS — Z1231 Encounter for screening mammogram for malignant neoplasm of breast: Secondary | ICD-10-CM | POA: Diagnosis not present

## 2016-06-16 NOTE — Progress Notes (Signed)
Subjective:    Patient ID: Sara [name], female    DOB: 1946-02-25, 71 y.o.   MRN: [id number]  HPI She is here for follow up of her recent results.   She has moderate hiatal hernia seen on Ct scan.  She does have a history of a large hiatal hernia and had it repaired in 2011.  When she had her GB removed 2014 - it was small at that time.  She has been having epigastric pain intermittently  - about every 2 days.  She gets occasional GERD.  She is compliant GERD.  She is taking protonix and her GERD is controlled.  She does get some pain radiating to her back.    Back pain, hip pain:  She has posterior hip ( SI joint ) pain x 3 weeks.  When she changes position it catches.  Walking does not cause pain.  Being on her feet for a long period of time or bending causes some discomfort.  She denies lateral hip pain.  She had left groin pain once, but no chronic pain.   Medications and allergies reviewed with patient and updated if appropriate.  Patient Active Problem List   Diagnosis Date Noted  . Epigastric pain 05/19/2016  . Atypical chest pain 05/10/2016  . Renal insufficiency 12/16/2015  . Low back pain 10/06/2015  . UTI (urinary tract infection) 10/06/2015  . S/P laparoscopic cholecystectomy-for advanced necrotic gallbladder August 2014 11/05/2012  . DEPRESSION, SITUATIONAL 02/20/2009  . GERD 02/20/2009  . RENAL CALCULUS, HX OF 02/20/2009  . Essential hypertension 02/14/2009  . Diaphragmatic hernia 02/14/2009  . SYNCOPE 02/05/2009    Current Outpatient Prescriptions on File Prior to Visit  Medication Sig Dispense Refill  . amLODipine (NORVASC) 5 MG tablet Take 1 tablet (5 mg total) by mouth every morning. 90 tablet 3  . Calcium Carbonate-Vitamin D (CALCIUM-VITAMIN D) 500-200 MG-UNIT per tablet Take 1 tablet by mouth 2 (two) times daily with a meal.    . Cholecalciferol 1000 UNITS tablet Take 1,000 Units by mouth daily.     . Cyanocobalamin (VITAMIN B-12 CR PO) Take by mouth  daily.    . fluticasone (FLONASE) 50 MCG/ACT nasal spray Place 2 sprays into both nostrils daily. 16 g 6  . MOVIPREP 100 G SOLR Moviprep as directed, no substitutions 1 kit 0  . Multiple Vitamin (MULTIVITAMIN WITH MINERALS) TABS Take 1 tablet by mouth daily.    . pantoprazole (PROTONIX) 40 MG tablet Take 1 tablet (40 mg total) by mouth daily. 30 tablet 3  . Probiotic Product (PROBIOTIC DAILY PO) Take by mouth daily.     No current facility-administered medications on file prior to visit.     Past Medical History:  Diagnosis Date  . Barrett's esophagus   . GERD (gastroesophageal reflux disease)    hx of   . HIATAL HERNIA    s/p surgical repair  . HYPERTENSION   . Kidney stone    1990s    Past Surgical History:  Procedure Laterality Date  . CATARACT EXTRACTION  2009   both eyes  . CHOLECYSTECTOMY  11/02/2012   Procedure: CHOLECYSTECTOMY;  Surgeon: Pedro Earls, MD;  Location: [facility];  Service: General;;  . [place] REPAIR N/A 06/12/2012   Procedure: ANTERIOR REPAIR (CYSTOCELE);  Surgeon: Elveria Royals, MD;  Location: [facility];  Service: Gynecology;  Laterality: N/A;  . HIATAL HERNIA REPAIR    . LITHOTRIPSY    . TONSILLECTOMY  1968  . UPPER  GASTROINTESTINAL ENDOSCOPY    . UPPER GI ENDOSCOPY  11/02/2012   Procedure: UPPER GI ENDOSCOPY;  Surgeon: Pedro Earls, MD;  Location: [facility];  Service: General;;  . VAGINAL HYSTERECTOMY N/A 06/12/2012   Procedure: HYSTERECTOMY VAGINAL;  Surgeon: Elveria Royals, MD;  Location: [facility];  Service: Gynecology;  Laterality: N/A;    Social History   Social History  . Marital status: Widowed    Spouse name: N/A  . Number of children: N/A  . Years of education: 63   Occupational History  . Retired    Social History Main Topics  . Smoking status: Never Smoker  . Smokeless tobacco: Never Used     Comment: widowed since late 2011 (spouse had mod-adv dementia) 5 grown children, 3 nearby-,many g-kids  . Alcohol use No  . Drug use: No    . Sexual activity: Yes    Birth control/ protection: Post-menopausal   Other Topics Concern  . None   Social History Narrative  . None    Family History  Problem Relation Age of Onset  . Cancer Mother   . Hypertension Other     Parent  . Breast cancer Other   . Heart disease Other     parent, grandparent  . Colon cancer Paternal Aunt   . Breast cancer Maternal Grandmother   . Esophageal cancer Neg Hx   . Rectal cancer Neg Hx   . Stomach cancer Neg Hx     Review of Systems  Constitutional: Negative for fever.  Respiratory: Negative for shortness of breath.   Cardiovascular: Negative for chest pain and palpitations.  Gastrointestinal: Positive for abdominal pain (epigastric intermittent). Negative for blood in stool (no black stool).       Gerd controlled  Musculoskeletal: Positive for arthralgias and back pain (w/o radiation).       Objective:   Vitals:   06/17/16 1309  BP: 134/72  Pulse: 70  Resp: 16  Temp: 98.6 F (37 C)   Filed Weights   06/17/16 1309  Weight: 170 lb (77.1 kg)   Body mass index is 25.47 kg/m.  Wt Readings from Last 3 Encounters:  06/17/16 170 lb (77.1 kg)  05/17/16 167 lb (75.8 kg)  05/10/16 169 lb 12 oz (77 kg)     Physical Exam  Constitutional: She appears well-developed and well-nourished. No distress.  HENT:  Head: Normocephalic and atraumatic.  Abdominal: Soft. She exhibits no distension and no mass. There is no tenderness. There is no rebound and no guarding.  Musculoskeletal: She exhibits no edema.  No vertebral tenderness, left SI joint tenderness - minimal, no lateral hip pain, pain with movement in certain ways  Skin: She is not diaphoretic.          Assessment & Plan:    See Problem List for Assessment and Plan of chronic medical problems.

## 2016-06-17 ENCOUNTER — Ambulatory Visit (INDEPENDENT_AMBULATORY_CARE_PROVIDER_SITE_OTHER): Payer: Medicare Other | Admitting: Internal Medicine

## 2016-06-17 ENCOUNTER — Other Ambulatory Visit: Payer: Self-pay | Admitting: Internal Medicine

## 2016-06-17 ENCOUNTER — Encounter: Payer: Self-pay | Admitting: Internal Medicine

## 2016-06-17 ENCOUNTER — Telehealth: Payer: Self-pay

## 2016-06-17 VITALS — BP 134/72 | HR 70 | Temp 98.6°F | Resp 16 | Wt 170.0 lb

## 2016-06-17 DIAGNOSIS — K449 Diaphragmatic hernia without obstruction or gangrene: Secondary | ICD-10-CM

## 2016-06-17 DIAGNOSIS — M47818 Spondylosis without myelopathy or radiculopathy, sacral and sacrococcygeal region: Secondary | ICD-10-CM | POA: Insufficient documentation

## 2016-06-17 DIAGNOSIS — M4698 Unspecified inflammatory spondylopathy, sacral and sacrococcygeal region: Secondary | ICD-10-CM | POA: Diagnosis not present

## 2016-06-17 MED ORDER — PANTOPRAZOLE SODIUM 40 MG PO TBEC: 40.0000 mg | DELAYED_RELEASE_TABLET | Freq: Every day | ORAL | 1 refills | Status: DC

## 2016-06-17 MED ORDER — AMLODIPINE BESYLATE 5 MG PO TABS: 5.0000 mg | ORAL_TABLET | Freq: Every morning | ORAL | 3 refills | Status: DC

## 2016-06-17 MED FILL — AMLODIPINE BESYLATE 5 MG TA: 5 | 90 days supply | Qty: 90 | Fill #0

## 2016-06-17 NOTE — Telephone Encounter (Signed)
Patient request 90 day supply

## 2016-06-17 NOTE — Assessment & Plan Note (Addendum)
Recurrent hiatal hernia - causing discomfort No gerd - controlled with diet and protonix Small bland meals If pain increases or persists - she will see the surgeon who did her first surgery Monitor for signs of GI bleeding

## 2016-06-17 NOTE — Patient Instructions (Signed)
Your blood pressure medication was sent to the pharmacy.   If your hiatal hernia pain gets worse think about seeing the surgeon who did your first surgery.   If your left lower back pain, SI joint pain, does not get better, let me know and we can refer you to our sports medicine doctor.    Sacroiliac Joint Dysfunction Sacroiliac joint dysfunction is a condition that causes inflammation on one or both sides of the sacroiliac (SI) joint. The SI joint connects the lower part of the spine (sacrum) with the two upper portions of the pelvis (ilium). This condition causes deep aching or burning pain in the low back. In some cases, the pain may also spread into one or both buttocks or hips or spread down the legs. What are the causes? This condition may be caused by:  Pregnancy. During pregnancy, extra stress is put on the SI joints because the pelvis widens.  Injury, such as:  Car accidents.  Sport-related injuries.  Work-related injuries.  Having one leg that is shorter than the other.  Conditions that affect the joints, such as:  Rheumatoid arthritis.  Gout.  Psoriatic arthritis.  Joint infection (septic arthritis). Sometimes, the cause of SI joint dysfunction is not known. What are the signs or symptoms? Symptoms of this condition include:  Aching or burning pain in the lower back. The pain may also spread to other areas, such as:  Buttocks.  Groin.  Thighs and legs.  Muscle spasms in or around the painful areas.  Increased pain when standing, walking, running, stair climbing, bending, or lifting. How is this diagnosed? Your health care provider will do a physical exam and take your medical history. During the exam, the health care provider may move one or both of your legs to different positions to check for pain. Various tests may be done to help verify the diagnosis, including:  Imaging tests to look for other causes of pain. These may include:  MRI.  CT  scan.  Bone scan.  Diagnostic injection. A numbing medicine is injected into the SI joint using a needle. If the pain is temporarily improved or stopped after the injection, this can indicate that SI joint dysfunction is the problem. How is this treated? Treatment may vary depending on the cause and severity of your condition. Treatment options may include:  Applying ice or heat to the lower back area. This can help to reduce pain and muscle spasms.  Medicines to relieve pain or inflammation or to relax the muscles.  Wearing a back brace (sacroiliac brace) to help support the joint while your back is healing.  Physical therapy to increase muscle strength around the joint and flexibility at the joint. This may also involve learning proper body positions and ways of moving to relieve stress on the joint.  Direct manipulation of the SI joint.  Injections of steroid medicine into the joint in order to reduce pain and swelling.  Radiofrequency ablation to burn away nerves that are carrying pain messages from the joint.  Use of a device that provides electrical stimulation in order to reduce pain at the joint.  Surgery to put in screws and plates that limit or prevent joint motion. This is rare. Follow these instructions at home:  Rest as needed. Limit your activities as directed by your health care provider.  Take medicines only as directed by your health care provider.  If directed, apply ice to the affected area:  Put ice in a plastic bag.  Place a towel between your skin and the bag.  Leave the ice on for 20 minutes, 2-3 times per day.  Use a heating pad or a moist heat pack as directed by your health care provider.  Exercise as directed by your health care provider or physical therapist.  Keep all follow-up visits as directed by your health care provider. This is important. Contact a health care provider if:  Your pain is not controlled with medicine.  You have a  fever.  You have increasingly severe pain. Get help right away if:  You have weakness, numbness, or tingling in your legs or feet.  You lose control of your bladder or bowel. This information is not intended to replace advice given to you by your health care provider. Make sure you discuss any questions you have with your health care provider. Document Released: 06/07/2008 Document Revised: 08/17/2015 Document Reviewed: 11/16/2013 Elsevier Interactive Patient Education  2017 [employer].

## 2016-06-17 NOTE — Progress Notes (Signed)
Pre visit review using our clinic review tool, if applicable. No additional management support is needed unless otherwise documented below in the visit note. 

## 2016-06-17 NOTE — Assessment & Plan Note (Signed)
Pain suggestive of SI joint arthritis/ dysfunction Deferred sports medicine referral today - but if pain persists she will let me know

## 2016-06-19 MED ORDER — PANTOPRAZOLE SODIUM 40 MG PO TBEC: 40.0000 mg | DELAYED_RELEASE_TABLET | Freq: Every day | ORAL | 1 refills | Status: DC

## 2016-06-19 NOTE — Addendum Note (Signed)
Addended by: Earnstine Regal on: 06/19/2016 03:20 PM   Modules accepted: Orders

## 2016-06-19 NOTE — Telephone Encounter (Signed)
Pharmacy called for status on pantoprazole. Per chart rx was refilled but sent to wrong pharmacy. Resent to [facility] in [place]...Sara [name]

## 2016-09-11 ENCOUNTER — Other Ambulatory Visit: Payer: Medicare Other

## 2016-09-11 ENCOUNTER — Encounter: Payer: Self-pay | Admitting: Family Medicine

## 2016-09-11 ENCOUNTER — Ambulatory Visit (INDEPENDENT_AMBULATORY_CARE_PROVIDER_SITE_OTHER): Payer: Medicare Other | Admitting: Family Medicine

## 2016-09-11 VITALS — BP 122/70 | HR 65 | Temp 98.0°F | Wt 172.0 lb

## 2016-09-11 DIAGNOSIS — N3090 Cystitis, unspecified without hematuria: Secondary | ICD-10-CM | POA: Diagnosis not present

## 2016-09-11 DIAGNOSIS — R300 Dysuria: Secondary | ICD-10-CM | POA: Diagnosis not present

## 2016-09-11 LAB — POCT URINALYSIS DIPSTICK
Bilirubin, UA: NEGATIVE
Blood, UA: NEGATIVE
GLUCOSE UA: NEGATIVE
KETONES UA: NEGATIVE
Nitrite, UA: NEGATIVE
PROTEIN UA: NEGATIVE
Spec Grav, UA: >=1.03 — AB (ref 1.010–1.025)
Urobilinogen, UA: 0.2 E.U./dL
pH, UA: 6 (ref 5.0–8.0)

## 2016-09-11 MED ORDER — NITROFURANTOIN MONOHYD MACRO 100 MG PO CAPS: 100.0000 mg | ORAL_CAPSULE | Freq: Two times a day (BID) | ORAL | 0 refills | Status: DC

## 2016-09-11 MED FILL — NITROFURANTOIN MONO-MCR 100: 100 | 7 days supply | Qty: 14 | Fill #0

## 2016-09-11 NOTE — Progress Notes (Signed)
Subjective:    Patient ID: Sara [name], female    DOB: May 08, 1945, 71 y.o.   MRN: [id number]  HPI This is a 71 yo female who presents today with low back pain, urine odor and cloudy for 10 days. No fevers. No dysuria, no frequency. No nausea or vomiting or abdominal pain.   Past Medical History:  Diagnosis Date  . Barrett's esophagus   . GERD (gastroesophageal reflux disease)    hx of   . HIATAL HERNIA    s/p surgical repair  . HYPERTENSION   . Kidney stone    1990s   Past Surgical History:  Procedure Laterality Date  . CATARACT EXTRACTION  2009   both eyes  . CHOLECYSTECTOMY  11/02/2012   Procedure: CHOLECYSTECTOMY;  Surgeon: Pedro Earls, MD;  Location: [facility];  Service: General;;  . Lester Hammond REPAIR N/A 06/12/2012   Procedure: ANTERIOR REPAIR (CYSTOCELE);  Surgeon: Elveria Royals, MD;  Location: [facility];  Service: Gynecology;  Laterality: N/A;  . HIATAL HERNIA REPAIR  2011  . LITHOTRIPSY    . TONSILLECTOMY  1968  . UPPER GASTROINTESTINAL ENDOSCOPY    . UPPER GI ENDOSCOPY  11/02/2012   Procedure: UPPER GI ENDOSCOPY;  Surgeon: Pedro Earls, MD;  Location: [facility];  Service: General;;  . VAGINAL HYSTERECTOMY N/A 06/12/2012   Procedure: HYSTERECTOMY VAGINAL;  Surgeon: Elveria Royals, MD;  Location: [facility];  Service: Gynecology;  Laterality: N/A;   Family History  Problem Relation Age of Onset  . Cancer Mother   . Hypertension Other        Parent  . Breast cancer Other   . Heart disease Other        parent, grandparent  . Colon cancer Paternal Aunt   . Breast cancer Maternal Grandmother   . Esophageal cancer Neg Hx   . Rectal cancer Neg Hx   . Stomach cancer Neg Hx    Social History  Substance Use Topics  . Smoking status: Never Smoker  . Smokeless tobacco: Never Used     Comment: widowed since late 2011 (spouse had mod-adv dementia) 5 grown children, 3 nearby-,many g-kids  . Alcohol use No     Review of Systems Per HPI    Objective:   Physical  Exam  Constitutional: She is oriented to person, place, and time. She appears well-developed and well-nourished. No distress.  HENT:  Head: Normocephalic and atraumatic.  Cardiovascular: Normal rate, regular rhythm and normal heart sounds.   Pulmonary/Chest: Effort normal and breath sounds normal.  Abdominal: Soft. Bowel sounds are normal. There is no CVA tenderness.  Neurological: She is alert and oriented to person, place, and time.  Skin: Skin is warm and dry. She is not diaphoretic.  Psychiatric: She has a normal mood and affect. Her behavior is normal. Judgment and thought content normal.      BP 122/70 (BP Location: Right Arm, Patient Position: Sitting, Cuff Size: Large)   Pulse 65   Temp 98 F (36.7 C) (Oral)   Wt 172 lb (78 kg)   SpO2 98%   BMI 25.77 kg/m  Wt Readings from Last 3 Encounters:  09/11/16 172 lb (78 kg)  06/17/16 170 lb (77.1 kg)  05/17/16 167 lb (75.8 kg)   . Results for orders placed or performed in visit on 09/11/16  POCT urinalysis dipstick  Result Value Ref Range   Color, UA yellow    Clarity, UA cloudy    Glucose, UA neg  Bilirubin, UA neg    Ketones, UA neg    Spec Grav, UA >=1.030 (A) 1.010 - 1.025   Blood, UA neg    pH, UA 6.0 5.0 - 8.0   Protein, UA neg    Urobilinogen, UA 0.2 0.2 or 1.0 E.U./dL   Nitrite, UA neg    Leukocytes, UA Small (1+) (A) Negative       Assessment & Plan:  1. Dysuria - POCT urinalysis dipstick  2. Cystitis - Provided written and verbal information regarding diagnosis and treatment. - RTC precautions reviewed - nitrofurantoin, macrocrystal-monohydrate, (MACROBID) 100 MG capsule; Take 1 capsule (100 mg total) by mouth 2 (two) times daily.  Dispense: 14 capsule; Refill: 0 - Urine Culture; Future   Clarene Reamer, FNP-BC  [facility] [place]  09/11/2016 9:36 AM

## 2016-09-11 NOTE — Patient Instructions (Signed)

## 2016-09-12 LAB — URINE CULTURE: Organism ID, Bacteria: NO GROWTH

## 2016-10-30 ENCOUNTER — Ambulatory Visit (INDEPENDENT_AMBULATORY_CARE_PROVIDER_SITE_OTHER): Payer: Medicare Other | Admitting: Internal Medicine

## 2016-10-30 ENCOUNTER — Encounter: Payer: Self-pay | Admitting: Internal Medicine

## 2016-10-30 VITALS — BP 152/88 | HR 72 | Temp 98.3°F | Resp 16 | Wt 175.0 lb

## 2016-10-30 DIAGNOSIS — K219 Gastro-esophageal reflux disease without esophagitis: Secondary | ICD-10-CM

## 2016-10-30 DIAGNOSIS — M7661 Achilles tendinitis, right leg: Secondary | ICD-10-CM | POA: Insufficient documentation

## 2016-10-30 MED ORDER — RANITIDINE HCL 150 MG PO TABS: 150.0000 mg | ORAL_TABLET | Freq: Two times a day (BID) | ORAL | 3 refills | Status: DC

## 2016-10-30 MED ORDER — MELOXICAM 15 MG PO TABS: 15.0000 mg | ORAL_TABLET | Freq: Every day | ORAL | 0 refills | Status: DC

## 2016-10-30 NOTE — Progress Notes (Signed)
Subjective:    Patient ID: Sara [name], female    DOB: 1945/10/09, 71 y.o.   MRN: [id number]  HPI She is here for an acute visit.   Right Achilles pain: A few weeks ago she started having some pain and a popping sensation in the right Achilles. She stands a lot at work, which aggravates it. She was also walking for exercise, which also causes pain. For the past few days she has stopped walking and has reduced her activity. She has been icing the area and has been taking ibuprofen. There is an area of the tendon that is swollen and tender to touch. She denies any numbness or tingling in the foot. She denies pain in the actual ankle joint or foot.   GERD: She does take pantoprazole daily as prescribed. She has noticed some increased GERD recently.  Medications and allergies reviewed with patient and updated if appropriate.  Patient Active Problem List   Diagnosis Date Noted  . SI joint arthritis ([place]) 06/17/2016  . Epigastric pain 05/19/2016  . Atypical chest pain 05/10/2016  . Renal insufficiency 12/16/2015  . Low back pain 10/06/2015  . UTI (urinary tract infection) 10/06/2015  . S/P laparoscopic cholecystectomy-for advanced necrotic gallbladder August 2014 11/05/2012  . DEPRESSION, SITUATIONAL 02/20/2009  . GERD 02/20/2009  . RENAL CALCULUS, HX OF 02/20/2009  . Essential hypertension 02/14/2009  . Diaphragmatic hernia 02/14/2009  . SYNCOPE 02/05/2009    Current Outpatient Prescriptions on File Prior to Visit  Medication Sig Dispense Refill  . amLODipine (NORVASC) 5 MG tablet Take 1 tablet (5 mg total) by mouth every morning. Yearly physical is due in April must see MD for refills 30 tablet 0  . Calcium Carbonate-Vitamin D (CALCIUM-VITAMIN D) 500-200 MG-UNIT per tablet Take 1 tablet by mouth 2 (two) times daily with a meal.    . Cholecalciferol 1000 UNITS tablet Take 1,000 Units by mouth daily.     . Cyanocobalamin (VITAMIN B-12 CR PO) Take by mouth daily.    .  fluticasone (FLONASE) 50 MCG/ACT nasal spray Place 2 sprays into both nostrils daily. 16 g 6  . MOVIPREP 100 G SOLR Moviprep as directed, no substitutions 1 kit 0  . Multiple Vitamin (MULTIVITAMIN WITH MINERALS) TABS Take 1 tablet by mouth daily.    . nitrofurantoin, macrocrystal-monohydrate, (MACROBID) 100 MG capsule Take 1 capsule (100 mg total) by mouth 2 (two) times daily. 14 capsule 0  . pantoprazole (PROTONIX) 40 MG tablet Take 1 tablet (40 mg total) by mouth daily. 90 tablet 1  . Probiotic Product (PROBIOTIC DAILY PO) Take by mouth daily.     No current facility-administered medications on file prior to visit.     Past Medical History:  Diagnosis Date  . Barrett's esophagus   . GERD (gastroesophageal reflux disease)    hx of   . HIATAL HERNIA    s/p surgical repair  . HYPERTENSION   . Kidney stone    1990s    Past Surgical History:  Procedure Laterality Date  . CATARACT EXTRACTION  2009   both eyes  . CHOLECYSTECTOMY  11/02/2012   Procedure: CHOLECYSTECTOMY;  Surgeon: Pedro Earls, MD;  Location: [facility];  Service: General;;  . Lester Denham REPAIR N/A 06/12/2012   Procedure: ANTERIOR REPAIR (CYSTOCELE);  Surgeon: Elveria Royals, MD;  Location: [facility];  Service: Gynecology;  Laterality: N/A;  . HIATAL HERNIA REPAIR  2011  . LITHOTRIPSY    . TONSILLECTOMY  1968  . UPPER  GASTROINTESTINAL ENDOSCOPY    . UPPER GI ENDOSCOPY  11/02/2012   Procedure: UPPER GI ENDOSCOPY;  Surgeon: Pedro Earls, MD;  Location: [facility];  Service: General;;  . VAGINAL HYSTERECTOMY N/A 06/12/2012   Procedure: HYSTERECTOMY VAGINAL;  Surgeon: Elveria Royals, MD;  Location: [facility];  Service: Gynecology;  Laterality: N/A;    Social History   Social History  . Marital status: Widowed    Spouse name: N/A  . Number of children: N/A  . Years of education: 34   Occupational History  . Retired    Social History Main Topics  . Smoking status: Never Smoker  . Smokeless tobacco: Never Used      Comment: widowed since late 2011 (spouse had mod-adv dementia) 5 grown children, 3 nearby-,many g-kids  . Alcohol use No  . Drug use: No  . Sexual activity: Yes    Birth control/ protection: Post-menopausal   Other Topics Concern  . Not on file   Social History Narrative  . No narrative on file    Family History  Problem Relation Age of Onset  . Cancer Mother   . Hypertension Other        Parent  . Breast cancer Other   . Heart disease Other        parent, grandparent  . Colon cancer Paternal Aunt   . Breast cancer Maternal Grandmother   . Esophageal cancer Neg Hx   . Rectal cancer Neg Hx   . Stomach cancer Neg Hx     Review of Systems  Constitutional: Negative for fever.  Musculoskeletal: Negative for joint swelling.       Pain, mild swelling right Achilles  Skin: Negative for color change, rash and wound.  Neurological: Negative for weakness and numbness.       Objective:   Vitals:   10/30/16 0817  BP: (!) 152/88  Pulse: 72  Resp: 16  Temp: 98.3 F (36.8 C)   Filed Weights   10/30/16 0817  Weight: 175 lb (79.4 kg)   Body mass index is 26.22 kg/m.  Wt Readings from Last 3 Encounters:  10/30/16 175 lb (79.4 kg)  09/11/16 172 lb (78 kg)  06/17/16 170 lb (77.1 kg)     Physical Exam  Constitutional: She appears well-developed and well-nourished. No distress.  Musculoskeletal: She exhibits no edema.  Slight swelling mid Achilles on the right foot. This area is tender to palpation. Increased pain with flexion of foot. No calf tenderness or tenderness at the insertion of the Achilles on the heel, no foot tenderness  Neurological:  Normal sensation right foot. Normal strength in foot, but increased pain with flexion of foot  Skin: She is not diaphoretic.          Assessment & Plan:   See Problem List for Assessment and Plan of chronic medical problems.

## 2016-10-30 NOTE — Assessment & Plan Note (Signed)
She is having some increased GERD, which may be related to taking ibuprofen for her stomach Continue pantoprazole daily Start Zantac twice daily and decreased once GERD controlled She may need to discontinue the NSAIDs if her GERD does not improve or worsens

## 2016-10-30 NOTE — Patient Instructions (Signed)
Take the meloxicam daily with food.  Start the zantac 1-2 times a day for heartburn.   Ice at least twice a day.  Avoid excessive walking.   Wrap the ankle.   Follow up in 2 weeks, sooner if needed.    Achilles Tendinitis Achilles tendinitis is inflammation of the tough, cord-like band that attaches the lower leg muscles to the heel bone (Achilles tendon). This is usually caused by overusing the tendon and the ankle joint. Achilles tendinitis usually gets better over time with treatment and caring for yourself at home. It can take weeks or months to heal completely. What are the causes? This condition may be caused by:  A sudden increase in exercise or activity, such as running.  Doing the same exercises or activities (such as jumping) over and over.  Not warming up calf muscles before exercising.  Exercising in shoes that are worn out or not made for exercise.  Having arthritis or a bone growth (spur) on the back of the heel bone. This can rub against the tendon and hurt it.  Age-related wear and tear. Tendons become less flexible with age and more likely to be injured.  What are the signs or symptoms? Common symptoms of this condition include:  Pain in the Achilles tendon or in the back of the leg, just above the heel. The pain usually gets worse with exercise.  Stiffness or soreness in the back of the leg, especially in the morning.  Swelling of the skin over the Achilles tendon.  Thickening of the tendon.  Bone spurs at the bottom of the Achilles tendon, near the heel.  Trouble standing on tiptoe.  How is this diagnosed? This condition is diagnosed based on your symptoms and a physical exam. You may have tests, including:  X-rays.  MRI.  How is this treated? The goal of treatment is to relieve symptoms and help your injury heal. Treatment may include:  Decreasing or stopping activities that caused the tendinitis. This may mean switching to low-impact exercises  like biking or swimming.  Icing the injured area.  Doing physical therapy, including strengthening and stretching exercises.  NSAIDs to help relieve pain and swelling.  Using supportive shoes, wraps, heel lifts, or a walking boot (air cast).  Surgery. This may be done if your symptoms do not improve after 6 months.  Using high-energy shock wave impulses to stimulate the healing process (extracorporeal shock wave therapy). This is rare.  Injection of medicines to help relieve inflammation (corticosteroids). This is rare.  Follow these instructions at home: If you have an air cast:  Wear the cast as told by your health care provider. Remove it only as told by your health care provider.  Loosen the cast if your toes tingle, become numb, or turn cold and blue. Activity  Gradually return to your normal activities once your health care provider approves. Do not do activities that cause pain. ? Consider doing low-impact exercises, like cycling or swimming.  If you have an air cast, ask your health care provider when it is safe for you to drive.  If physical therapy was prescribed, do exercises as told by your health care provider or physical therapist. Managing pain, stiffness, and swelling  Raise (elevate) your foot above the level of your heart while you are sitting or lying down.  Move your toes often to avoid stiffness and to lessen swelling.  If directed, put ice on the injured area: ? Put ice in a plastic bag. ?  Place a towel between your skin and the bag. ? Leave the ice on for 20 minutes, 2-3 times a day General instructions  If directed, wrap your foot with an elastic bandage or other wrap. This can help keep your tendon from moving too much while it heals. Your health care provider will show you how to wrap your foot correctly.  Wear supportive shoes or heel lifts only as told by your health care provider.  Take over-the-counter and prescription medicines only as told  by your health care provider.  Keep all follow-up visits as told by your health care provider. This is important. Contact a health care provider if:  You have symptoms that gets worse.  You have pain that does not get better with medicine.  You develop new, unexplained symptoms.  You develop warmth and swelling in your foot.  You have a fever. Get help right away if:  You have a sudden popping sound or sensation in your Achilles tendon followed by severe pain.  You cannot move your toes or foot.  You cannot put any weight on your foot. Summary  Achilles tendinitis is inflammation of the tough, cord-like band that attaches the lower leg muscles to the heel bone (Achilles tendon).  This condition is usually caused by overusing the tendon and the ankle joint. It can also be caused by arthritis or normal aging.  The most common symptoms of this condition include pain, swelling, or stiffness in the Achilles tendon or in the back of the leg.  This condition is usually treated with rest, NSAIDs, and physical therapy. This information is not intended to replace advice given to you by your health care provider. Make sure you discuss any questions you have with your health care provider. Document Released: 12/19/2004 Document Revised: 01/29/2016 Document Reviewed: 01/29/2016 Elsevier Interactive Patient Education  2017 [employer].

## 2016-10-30 NOTE — Assessment & Plan Note (Signed)
Start Meloxicam 15 mg daily Ice the tendon at least twice a day She will try wrapping the foot She is already decreased her walking and will avoid walking and prolonged standing as much as possible Follow-up with sports medicine-Dr. Raeford Razor in 2 weeks-we will move up her appointment of her pain is not improving or her pain worsens

## 2016-11-14 MED FILL — AMLODIPINE BESYLATE 5 MG TA: 5 | 90 days supply | Qty: 90 | Fill #1

## 2016-11-15 ENCOUNTER — Encounter: Payer: Self-pay | Admitting: Family Medicine

## 2016-11-15 ENCOUNTER — Ambulatory Visit (INDEPENDENT_AMBULATORY_CARE_PROVIDER_SITE_OTHER): Payer: Medicare Other | Admitting: Family Medicine

## 2016-11-15 VITALS — BP 130/82 | HR 67 | Temp 98.1°F | Ht 68.5 in | Wt 174.0 lb

## 2016-11-15 DIAGNOSIS — M7661 Achilles tendinitis, right leg: Secondary | ICD-10-CM | POA: Diagnosis not present

## 2016-11-15 DIAGNOSIS — S86011A Strain of right Achilles tendon, initial encounter: Secondary | ICD-10-CM | POA: Insufficient documentation

## 2016-11-15 MED ORDER — NITROGLYCERIN 0.2 MG/HR TD PT24: MEDICATED_PATCH | TRANSDERMAL | 11 refills | Status: DC

## 2016-11-15 NOTE — Assessment & Plan Note (Signed)
Findings could be consistent with a partial tear of the Achilles tendon itself. She has significant thickening as well as hypoechoic change within the intracellular matrix of the Achilles tendon itself. - If she has no improvement with conservative measures may need to place her in a cam walker for some time - Advised follow-up in 2 weeks for re-imaging with ultrasound

## 2016-11-15 NOTE — Assessment & Plan Note (Addendum)
Finding on ultrasound are consistent with tendinopathy - Advised obtaining heel lift - Nitroglycerin patches - Can try body helix - Provided a work note today. - Provided home exercises. Advised to not start until pain was under control. - If no improvement may need to have formal physical therapy.

## 2016-11-15 NOTE — Patient Instructions (Addendum)
Thank you for coming in,   Please follow up with me in two weeks if you don't have any improvement. Continue taking the mobic.   Nitroglycerin Protocol   Apply 1/4 nitroglycerin patch to affected area daily.  Change position of patch within the affected area every 24 hours.  You may experience a headache during the first 1-2 weeks of using the patch, these should subside.  If you experience headaches after beginning nitroglycerin patch treatment, you may take your preferred over the counter pain reliever.  Another side effect of the nitroglycerin patch is skin irritation or rash related to patch adhesive.  Please notify our office if you develop more severe headaches or rash, and stop the patch.  Tendon healing with nitroglycerin patch may require 12 to 24 weeks depending on the extent of injury.  Men should not use if taking Viagra, Cialis, or Levitra.   Do not use if you have migraines or rosacea.     Please feel free to call with any questions or concerns at any time, at [phone]. --Dr. Raeford Razor

## 2016-11-15 NOTE — Progress Notes (Signed)
Sherly Brodbeck - 71 y.o. female MRN [id number]  Date of birth: Nov 10, 1945  SUBJECTIVE:  Including CC & ROS.  Chief Complaint  Patient presents with  . Follow-up    right achilles tendonitis, feeling better and swelling went down. patient states it still hurts to wear tennis shoes and foot swells when she wears them. can only work about five hours before she has to go home   Ms. Windle is a 71 year old female is presenting with right Achilles tendinitis. The pain is occurring over the dorsal aspect of the right Achilles. It is at the mid substance area roughly 4 cm proximal from the insertion. She reports the symptoms present for about a month and a half now. She denies any initial injury to her Achilles. The pain is significant in nature. The pain has become more constant. It is worse if she is going up or down stairs. She has has noticeable swelling of her Achilles. She's had improvement of her pain since started on the anti-inflammatory. She works a job and has to stand for most of the day. She denies any prior history of similar symptoms.   Patient was seen by Dr. Quay Burow on 8/8 kg tendinitis. She was started on mobic at that time. No imaging to review  Review of Systems  Cardiovascular: Negative for leg swelling.  Musculoskeletal: Negative for gait problem and joint swelling.  Skin: Negative for color change.  Neurological: Negative for weakness and numbness.  Hematological: Negative for adenopathy.    HISTORY: Past Medical, Surgical, Social, and Family History Reviewed & Updated per EMR.   Pertinent Historical Findings include:  Past Medical History:  Diagnosis Date  . Barrett's esophagus   . GERD (gastroesophageal reflux disease)    hx of   . HIATAL HERNIA    s/p surgical repair  . HYPERTENSION   . Kidney stone    1990s    Past Surgical History:  Procedure Laterality Date  . CATARACT EXTRACTION  2009   both eyes  . CHOLECYSTECTOMY  11/02/2012   Procedure:  CHOLECYSTECTOMY;  Surgeon: Pedro Earls, MD;  Location: [facility];  Service: General;;  . Lester [place] REPAIR N/A 06/12/2012   Procedure: ANTERIOR REPAIR (CYSTOCELE);  Surgeon: Elveria Royals, MD;  Location: [facility];  Service: Gynecology;  Laterality: N/A;  . HIATAL HERNIA REPAIR  2011  . LITHOTRIPSY    . TONSILLECTOMY  1968  . UPPER GASTROINTESTINAL ENDOSCOPY    . UPPER GI ENDOSCOPY  11/02/2012   Procedure: UPPER GI ENDOSCOPY;  Surgeon: Pedro Earls, MD;  Location: [facility];  Service: General;;  . VAGINAL HYSTERECTOMY N/A 06/12/2012   Procedure: HYSTERECTOMY VAGINAL;  Surgeon: Elveria Royals, MD;  Location: [facility];  Service: Gynecology;  Laterality: N/A;    No Known Allergies  Family History  Problem Relation Age of Onset  . Cancer Mother   . Hypertension Other        Parent  . Breast cancer Other   . Heart disease Other        parent, grandparent  . Colon cancer Paternal Aunt   . Breast cancer Maternal Grandmother   . Esophageal cancer Neg Hx   . Rectal cancer Neg Hx   . Stomach cancer Neg Hx      Social History   Social History  . Marital status: Widowed    Spouse name: N/A  . Number of children: N/A  . Years of education: 65   Occupational History  . Retired  Social History Main Topics  . Smoking status: Never Smoker  . Smokeless tobacco: Never Used     Comment: widowed since late 2011 (spouse had mod-adv dementia) 5 grown children, 3 nearby-,many g-kids  . Alcohol use No  . Drug use: No  . Sexual activity: Yes    Birth control/ protection: Post-menopausal   Other Topics Concern  . Not on file   Social History Narrative  . No narrative on file     PHYSICAL EXAM:  VS: BP 130/82 (BP Location: Left Arm, Patient Position: Sitting, Cuff Size: Normal)   Pulse 67   Temp 98.1 F (36.7 C) (Oral)   Ht 5' 8.5" (1.74 m)   Wt 174 lb (78.9 kg)   SpO2 98%   BMI 26.07 kg/m  Physical Exam Gen: NAD, alert, cooperative with exam, well-appearing ENT: normal  lips, normal nasal mucosa,  Eye: normal EOM, normal conjunctiva and lids CV:  no edema, +2 pedal pulses   Resp: no accessory muscle use, non-labored,  Skin: no rashes, no areas of induration  Neuro: normal tone, normal sensation to touch Psych:  normal insight, alert and oriented MSK:  Right Achilles: Significant tenderness to palpation of the mid substance Achilles.  Obvious swelling of the Achilles. Pain with range of motion of the ankle. Normal strength. Has some splaying of the first and second digit on each toe. Some loss of the transverse arch. Longitudinal arch is fairly well-preserved. Neurovascular intact.  Limited ultrasound: Right Achilles:  A mild spur at the insertion  Significant thickening of the mid substance of the Achilles measured at 1.41 centimeters Transverse views shows mucus changes within the intracellular matrix of the Achilles  Summary: Findings consistent with either tendinopathy versus a partial Achilles tear.  Ultrasound and interpretation by Clearance Coots, MD              ASSESSMENT & PLAN:   Achilles tendinitis of right lower extremity Finding on ultrasound are consistent with tendinopathy - Advised obtaining heel lift - Nitroglycerin patches - Can try body helix - Provided a work note today. - Provided home exercises. Advised to not start until pain was under control. - If no improvement may need to have formal physical therapy.   Partial tear of right Achilles tendon Findings could be consistent with a partial tear of the Achilles tendon itself. She has significant thickening as well as hypoechoic change within the intracellular matrix of the Achilles tendon itself. - If she has no improvement with conservative measures may need to place her in a cam walker for some time - Advised follow-up in 2 weeks for re-imaging with ultrasound

## 2016-12-09 ENCOUNTER — Encounter: Payer: Self-pay | Admitting: Family Medicine

## 2016-12-09 ENCOUNTER — Ambulatory Visit (INDEPENDENT_AMBULATORY_CARE_PROVIDER_SITE_OTHER): Payer: Medicare Other | Admitting: Family Medicine

## 2016-12-09 VITALS — BP 148/92 | HR 75 | Temp 98.8°F | Ht 68.5 in | Wt 179.4 lb

## 2016-12-09 DIAGNOSIS — M7671 Peroneal tendinitis, right leg: Secondary | ICD-10-CM | POA: Diagnosis not present

## 2016-12-09 DIAGNOSIS — S86011D Strain of right Achilles tendon, subsequent encounter: Secondary | ICD-10-CM

## 2016-12-09 DIAGNOSIS — M7661 Achilles tendinitis, right leg: Secondary | ICD-10-CM

## 2016-12-09 MED ORDER — MELOXICAM 15 MG PO TABS: 15.0000 mg | ORAL_TABLET | Freq: Every day | ORAL | 0 refills | Status: DC

## 2016-12-09 NOTE — Assessment & Plan Note (Signed)
Tendon seems to be improving but slowly

## 2016-12-09 NOTE — Progress Notes (Signed)
Sara [name] - 71 y.o. female MRN [id number]  Date of birth: 02-Feb-1946  SUBJECTIVE:  Including CC & ROS.  Chief Complaint  Patient presents with  . Follow-up    Patient is here today to F/U with right ankle. She states that a new pain has started on the right back side of ankle and shoots up just behind the ankle bone.  She is walking better but is just concerned.  The pain keeps her up at night. She ran out of Mobic 15mg  on Friday and would like a refill as it seemed helpful.    Sara [name] is a 71 year old female and is following up for Achilles tendinopathy and partial tear. She reports that she has had some improvement of the pain. She has been using nitroglycerin patches and taking meloxicam. The pain seems to be worse after a shift at her work. She is able to walk short distances without pain. She does have significant pain while going downstairs. She feels like she has had some mild improvement but still significant swelling of her Achilles. She has been doing the exercises does not seem to have any pain associated with them.She has use nitroglycerin therapy for 3 weeks. Her initial complaints started roughly 8-10 weeks ago.  She is also having some pain on the lateral aspect of her ankle that radiates down to the lateral aspect of her foot. She also has some radiation proximally on the lateral aspect of her right distal leg. She denies any injury.   Patient was seen by Dr. Quay Burow on 8/8 for tendinitis. Reviewed previous ultrasound images that showed a thickened Achilles and mucoid change of the central matrix.  Review of Systems  Constitutional: Negative for fever.  Musculoskeletal: Positive for gait problem.  Skin: Negative for color change.  Neurological: Negative for weakness and numbness.    HISTORY: Past Medical, Surgical, Social, and Family History Reviewed & Updated per EMR.   Pertinent Historical Findings include:  Past Medical History:  Diagnosis Date  . Barrett's  esophagus   . GERD (gastroesophageal reflux disease)    hx of   . HIATAL HERNIA    s/p surgical repair  . HYPERTENSION   . Kidney stone    1990s    Past Surgical History:  Procedure Laterality Date  . CATARACT EXTRACTION  2009   both eyes  . CHOLECYSTECTOMY  11/02/2012   Procedure: CHOLECYSTECTOMY;  Surgeon: Pedro Earls, MD;  Location: [facility];  Service: General;;  . Lester [place] REPAIR N/A 06/12/2012   Procedure: ANTERIOR REPAIR (CYSTOCELE);  Surgeon: Elveria Royals, MD;  Location: [facility];  Service: Gynecology;  Laterality: N/A;  . HIATAL HERNIA REPAIR  2011  . LITHOTRIPSY    . TONSILLECTOMY  1968  . UPPER GASTROINTESTINAL ENDOSCOPY    . UPPER GI ENDOSCOPY  11/02/2012   Procedure: UPPER GI ENDOSCOPY;  Surgeon: Pedro Earls, MD;  Location: [facility];  Service: General;;  . VAGINAL HYSTERECTOMY N/A 06/12/2012   Procedure: HYSTERECTOMY VAGINAL;  Surgeon: Elveria Royals, MD;  Location: [facility];  Service: Gynecology;  Laterality: N/A;    No Known Allergies  Family History  Problem Relation Age of Onset  . Cancer Mother   . Hypertension Other        Parent  . Breast cancer Other   . Heart disease Other        parent, grandparent  . Colon cancer Paternal Aunt   . Breast cancer Maternal Grandmother   . Esophageal cancer  Neg Hx   . Rectal cancer Neg Hx   . Stomach cancer Neg Hx      Social History   Social History  . Marital status: Widowed    Spouse name: N/A  . Number of children: N/A  . Years of education: 10   Occupational History  . Retired    Social History Main Topics  . Smoking status: Never Smoker  . Smokeless tobacco: Never Used     Comment: widowed since late 2011 (spouse had mod-adv dementia) 5 grown children, 3 nearby-,many g-kids  . Alcohol use No  . Drug use: No  . Sexual activity: Yes    Birth control/ protection: Post-menopausal   Other Topics Concern  . Not on file   Social History Narrative  . No narrative on file     PHYSICAL EXAM:    VS: BP (!) 148/92 (BP Location: Left Arm, Patient Position: Sitting, Cuff Size: Normal)   Pulse 75   Temp 98.8 F (37.1 C) (Oral)   Ht 5' 8.5" (1.74 m)   Wt 179 lb 6.4 oz (81.4 kg)   SpO2 98%   BMI 26.88 kg/m  Physical Exam Gen: NAD, alert, cooperative with exam, well-appearing ENT: normal lips, normal nasal mucosa,  Eye: normal EOM, normal conjunctiva and lids CV:  no edema, +2 pedal pulses   Resp: no accessory muscle use, non-labored,  Skin: no rashes, no areas of induration  Neuro: normal tone, normal sensation to touch Psych:  normal insight, alert and oriented MSK:  Right Achilles: Swelling of the mid substance of the Achilles is still present. Able to rise up on her tiptoes on her right foot. Normal ankle range of motion. Plantarflexion with [place] Silos test Normal strength resistance. Negative talar tilt. Negative anterior drawer. Neurovascularly intact.  Limited ultrasound: Right ankle:  Achilles is still thickened with mucoid change within the intracellular matrix. Peroneal tendons showing some edema encompassing the tendon at the lateral malleolus  Summary: Ongoing Achilles tendinopathy and peroneal tendinitis  Ultrasound and interpretation by Clearance Coots, MD             ASSESSMENT & PLAN:   Achilles tendinitis of right lower extremity She has had improvement of her imaging as well as her symptomatology. Her Achilles is still severely thickened. Has been using nitroglycerin for the past 3 weeks. - Refilled mobic - Continue nitroglycerin - Placed in Cam Walker - Provided work note to allow use of Cam Walker - Follow-up in 2 weeks to reimage - Withholding from home exercise program currently  Partial tear of right Achilles tendon Tendon seems to be improving but slowly  Peroneal tendinitis of right lower extremity This appears to be the result of her compensating for her Achilles tendinopathy. She has some swelling observed on ultrasound and  pain on the lateral aspect of her foot and ankle. - She is taking mobic - Using the cam walker should help alleviate the symptoms as well.

## 2016-12-09 NOTE — Assessment & Plan Note (Signed)
This appears to be the result of her compensating for her Achilles tendinopathy. She has some swelling observed on ultrasound and pain on the lateral aspect of her foot and ankle. - She is taking mobic - Using the cam walker should help alleviate the symptoms as well.

## 2016-12-09 NOTE — Assessment & Plan Note (Signed)
She has had improvement of her imaging as well as her symptomatology. Her Achilles is still severely thickened. Has been using nitroglycerin for the past 3 weeks. - Refilled mobic - Continue nitroglycerin - Placed in Cam Walker - Provided work note to allow use of Cam Walker - Follow-up in 2 weeks to reimage - Withholding from home exercise program currently

## 2016-12-09 NOTE — Patient Instructions (Signed)
Thank you for coming in,   Please follow up with me in two weeks.   Please hold off on the exercises for now.    Please feel free to call with any questions or concerns at any time, at [phone]. --Dr. Raeford Razor

## 2016-12-23 ENCOUNTER — Ambulatory Visit (INDEPENDENT_AMBULATORY_CARE_PROVIDER_SITE_OTHER): Payer: Medicare Other | Admitting: Family Medicine

## 2016-12-23 ENCOUNTER — Encounter: Payer: Self-pay | Admitting: Family Medicine

## 2016-12-23 VITALS — BP 140/80 | HR 67 | Temp 98.6°F | Ht 68.5 in | Wt 178.0 lb

## 2016-12-23 DIAGNOSIS — M7671 Peroneal tendinitis, right leg: Secondary | ICD-10-CM

## 2016-12-23 DIAGNOSIS — M7661 Achilles tendinitis, right leg: Secondary | ICD-10-CM

## 2016-12-23 NOTE — Progress Notes (Signed)
Sara [name] - 71 y.o. female MRN [id number]  Date of birth: [birth year]  SUBJECTIVE:  Including CC & ROS.  No chief complaint on file.   Sara [name] is a 71 year old female is following up for Achilles tendinitis and peroneal tendinitis. She has been taking mobic and nitroglycerin patches. She was placed and a cam walker on 9/17. She has been taking nitroglycerin for 5 weeks. She feels better with the boot and has had improvement of her swelling. She still feels pain towards the end of a shift, roughly after 3 and half hours. She denies any subsequent injury. She has not tried any exercises yet. She is no longer having any pain on the lateral aspect of her foot. The pain in the Achilles is that the midsubstance portion roughly 4 cm proximally from the insertion.     Review of Systems  Musculoskeletal: Positive for gait problem. Negative for joint swelling.  Skin: Negative for color change.  Neurological: Negative for weakness and numbness.    HISTORY: Past Medical, Surgical, Social, and Family History Reviewed & Updated per EMR.   Pertinent Historical Findings include:  Past Medical History:  Diagnosis Date  . Barrett's esophagus   . GERD (gastroesophageal reflux disease)    hx of   . HIATAL HERNIA    s/p surgical repair  . HYPERTENSION   . Kidney stone    1990s    Past Surgical History:  Procedure Laterality Date  . CATARACT EXTRACTION  2009   both eyes  . CHOLECYSTECTOMY  11/02/2012   Procedure: CHOLECYSTECTOMY;  Surgeon: Pedro Earls, MD;  Location: [facility];  Service: General;;  . Lester [place] REPAIR N/A 06/12/2012   Procedure: ANTERIOR REPAIR (CYSTOCELE);  Surgeon: Elveria Royals, MD;  Location: [facility];  Service: Gynecology;  Laterality: N/A;  . HIATAL HERNIA REPAIR  2011  . LITHOTRIPSY    . TONSILLECTOMY  1968  . UPPER GASTROINTESTINAL ENDOSCOPY    . UPPER GI ENDOSCOPY  11/02/2012   Procedure: UPPER GI ENDOSCOPY;  Surgeon: Pedro Earls, MD;  Location: [facility];   Service: General;;  . VAGINAL HYSTERECTOMY N/A 06/12/2012   Procedure: HYSTERECTOMY VAGINAL;  Surgeon: Elveria Royals, MD;  Location: [facility];  Service: Gynecology;  Laterality: N/A;    No Known Allergies  Family History  Problem Relation Age of Onset  . Cancer Mother   . Hypertension Other        Parent  . Breast cancer Other   . Heart disease Other        parent, grandparent  . Colon cancer Paternal Aunt   . Breast cancer Maternal Grandmother   . Esophageal cancer Neg Hx   . Rectal cancer Neg Hx   . Stomach cancer Neg Hx      Social History   Social History  . Marital status: Widowed    Spouse name: N/A  . Number of children: N/A  . Years of education: 94   Occupational History  . Retired    Social History Main Topics  . Smoking status: Never Smoker  . Smokeless tobacco: Never Used     Comment: widowed since late 2011 (spouse had mod-adv dementia) 5 grown children, 3 nearby-,many g-kids  . Alcohol use No  . Drug use: No  . Sexual activity: Yes    Birth control/ protection: Post-menopausal   Other Topics Concern  . Not on file   Social History Narrative  . No narrative on file     PHYSICAL  EXAM:  VS: BP 140/80 (BP Location: Left Arm, Patient Position: Sitting, Cuff Size: Large)   Pulse 67   Temp 98.6 F (37 C) (Oral)   Ht 5' 8.5" (1.74 m)   Wt 178 lb (80.7 kg)   SpO2 96%   BMI 26.67 kg/m  Physical Exam Gen: NAD, alert, cooperative with exam, well-appearing ENT: normal lips, normal nasal mucosa,  Eye: normal EOM, normal conjunctiva and lids CV:  no edema, +2 pedal pulses   Resp: no accessory muscle use, non-labored,  Skin: no rashes, no areas of induration  Neuro: normal tone, normal sensation to touch Psych:  normal insight, alert and oriented MSK:  Right foot: Swelling of the midsubstance of the Achilles is present. Normal flexion and extension. Normal range of motion. Normal strength. No tenderness to palpation of the lateral aspect of the  foot or ankle.  Neurovascularly intact  Limited ultrasound: Right foot:  Achilles is still thickened on long axis. Intracellular change within the tendon seems to improved on the short axis Peroneal tendons appear to be normal and short axis  Summary: Ongoing Achilles tendinopathy  Ultrasound and interpretation by Clearance Coots, MD          ASSESSMENT & PLAN:   Peroneal tendinitis of right lower extremity This seems much improved after the use of the day. Most likely it was a compensation when she was walking without the boot.  Achilles tendinitis of right lower extremity Has had some improvement of the thickness of her Achilles. The pain has improved as well. She is still taking meloxicam and using nitroglycerin patches. This will be 5 weeks of nitroglycerin therapy. Has used the cam walker for 2 weeks now. Her initial symptoms started about 12 weeks ago. - Counseled to use the cam walker for 2 more weeks. After that she can start doing exercises and weaning out of it for short durations of time. - Continue nitroglycerin patches - can follow up in 3-4 weeks. Would re-image. May need to refer for formal PT. Will likely stop the CAM walker. Transition to heel lift

## 2016-12-23 NOTE — Patient Instructions (Signed)
Thank you for coming in,   Please wear the boot for 2 more weeks.  Please try exercises in three weeks and start to wean out of the boot.   Please follow up with me in 3-4 weeks to re-image.    Please feel free to call with any questions or concerns at any time, at [phone]. --Dr. Raeford Razor

## 2016-12-23 NOTE — Assessment & Plan Note (Signed)
This seems much improved after the use of the day. Most likely it was a compensation when she was walking without the boot.

## 2016-12-23 NOTE — Assessment & Plan Note (Signed)
Has had some improvement of the thickness of her Achilles. The pain has improved as well. She is still taking meloxicam and using nitroglycerin patches. This will be 5 weeks of nitroglycerin therapy. Has used the cam walker for 2 weeks now. Her initial symptoms started about 12 weeks ago. - Counseled to use the cam walker for 2 more weeks. After that she can start doing exercises and weaning out of it for short durations of time. - Continue nitroglycerin patches - can follow up in 3-4 weeks. Would re-image. May need to refer for formal PT. Will likely stop the CAM walker. Transition to heel lift

## 2017-01-01 ENCOUNTER — Encounter: Payer: Self-pay | Admitting: Internal Medicine

## 2017-01-01 ENCOUNTER — Ambulatory Visit (INDEPENDENT_AMBULATORY_CARE_PROVIDER_SITE_OTHER): Payer: Medicare Other | Admitting: Internal Medicine

## 2017-01-01 ENCOUNTER — Other Ambulatory Visit: Payer: Medicare Other

## 2017-01-01 VITALS — BP 156/94 | HR 87 | Temp 98.1°F | Resp 16 | Wt 178.0 lb

## 2017-01-01 DIAGNOSIS — N390 Urinary tract infection, site not specified: Secondary | ICD-10-CM | POA: Diagnosis not present

## 2017-01-01 DIAGNOSIS — R8290 Unspecified abnormal findings in urine: Secondary | ICD-10-CM

## 2017-01-01 LAB — POCT URINALYSIS DIPSTICK
Bilirubin, UA: NEGATIVE
Blood, UA: NEGATIVE
Glucose, UA: NEGATIVE
Ketones, UA: NEGATIVE
NITRITE UA: NEGATIVE
PH UA: 6 (ref 5.0–8.0)
PROTEIN UA: NEGATIVE
Spec Grav, UA: 1.02 (ref 1.010–1.025)
Urobilinogen, UA: 0.2 E.U./dL

## 2017-01-01 MED ORDER — SULFAMETHOXAZOLE-TRIMETHOPRIM 800-160 MG PO TABS: 1.0000 | ORAL_TABLET | Freq: Two times a day (BID) | ORAL | 0 refills | Status: DC

## 2017-01-01 NOTE — Progress Notes (Signed)
Subjective:    Patient ID: Sara [name], female    DOB: February 29, 1946, 71 y.o.   MRN: [id number]  HPI She is here for an acute visit.   Over the past couple of day her urine has been cloudy and has a odor.   She has some discomfort in her lower back but thinks that may be related to the cam boot.  She has some increased urinary frequency and dribbling, but denies fever, chills, nausea and abdominal pain.     Medications and allergies reviewed with patient and updated if appropriate.  Patient Active Problem List   Diagnosis Date Noted  . Peroneal tendinitis of right lower extremity 12/09/2016  . Partial tear of right Achilles tendon 11/15/2016  . Achilles tendinitis of right lower extremity 10/30/2016  . SI joint arthritis ([place]) 06/17/2016  . Epigastric pain 05/19/2016  . Atypical chest pain 05/10/2016  . Renal insufficiency 12/16/2015  . Low back pain 10/06/2015  . UTI (urinary tract infection) 10/06/2015  . S/P laparoscopic cholecystectomy-for advanced necrotic gallbladder August 2014 11/05/2012  . DEPRESSION, SITUATIONAL 02/20/2009  . GERD 02/20/2009  . RENAL CALCULUS, HX OF 02/20/2009  . Essential hypertension 02/14/2009  . Diaphragmatic hernia 02/14/2009  . SYNCOPE 02/05/2009    Current Outpatient Prescriptions on File Prior to Visit  Medication Sig Dispense Refill  . amLODipine (NORVASC) 5 MG tablet Take 1 tablet (5 mg total) by mouth every morning. Yearly physical is due in April must see MD for refills 30 tablet 0  . Calcium Carbonate-Vitamin D (CALCIUM-VITAMIN D) 500-200 MG-UNIT per tablet Take 1 tablet by mouth 2 (two) times daily with a meal.    . Cholecalciferol 1000 UNITS tablet Take 1,000 Units by mouth daily.     . Cyanocobalamin (VITAMIN B-12 CR PO) Take by mouth daily.    . fluticasone (FLONASE) 50 MCG/ACT nasal spray Place 2 sprays into both nostrils daily. 16 g 6  . MOVIPREP 100 G SOLR Moviprep as directed, no substitutions 1 kit 0  . Multiple Vitamin  (MULTIVITAMIN WITH MINERALS) TABS Take 1 tablet by mouth daily.    . nitroGLYCERIN (NITRODUR - DOSED IN MG/24 HR) 0.2 mg/hr patch Cut and apply 1/4 patch to most painful area q24h. 30 patch 11  . pantoprazole (PROTONIX) 40 MG tablet Take 1 tablet (40 mg total) by mouth daily. 90 tablet 1  . Probiotic Product (PROBIOTIC DAILY PO) Take by mouth daily.    . ranitidine (ZANTAC) 150 MG tablet Take 1 tablet (150 mg total) by mouth 2 (two) times daily. 60 tablet 3   No current facility-administered medications on file prior to visit.     Past Medical History:  Diagnosis Date  . Barrett's esophagus   . GERD (gastroesophageal reflux disease)    hx of   . HIATAL HERNIA    s/p surgical repair  . HYPERTENSION   . Kidney stone    1990s    Past Surgical History:  Procedure Laterality Date  . CATARACT EXTRACTION  2009   both eyes  . CHOLECYSTECTOMY  11/02/2012   Procedure: CHOLECYSTECTOMY;  Surgeon: Pedro Earls, MD;  Location: [facility];  Service: General;;  . Lester [place] REPAIR N/A 06/12/2012   Procedure: ANTERIOR REPAIR (CYSTOCELE);  Surgeon: Elveria Royals, MD;  Location: [facility];  Service: Gynecology;  Laterality: N/A;  . HIATAL HERNIA REPAIR  2011  . LITHOTRIPSY    . TONSILLECTOMY  1968  . UPPER GASTROINTESTINAL ENDOSCOPY    . UPPER GI  ENDOSCOPY  11/02/2012   Procedure: UPPER GI ENDOSCOPY;  Surgeon: Pedro Earls, MD;  [facility]: [facility];  Service: General;;  . VAGINAL HYSTERECTOMY N/A 06/12/2012   Procedure: HYSTERECTOMY VAGINAL;  Surgeon: Elveria Royals, MD;  Location: [facility];  Service: Gynecology;  Laterality: N/A;    Social History   Social History  . Marital status: Widowed    Spouse name: N/A  . Number of children: N/A  . Years of education: 48   Occupational History  . Retired    Social History Main Topics  . Smoking status: Never Smoker  . Smokeless tobacco: Never Used     Comment: widowed since late 2011 (spouse had mod-adv dementia) 5 grown children, 3 nearby-,many  g-kids  . Alcohol use No  . Drug use: No  . Sexual activity: Yes    Birth control/ protection: Post-menopausal   Other Topics Concern  . Not on file   Social History Narrative  . No narrative on file    Family History  Problem Relation Age of Onset  . Cancer Mother   . Hypertension Other        Parent  . Breast cancer Other   . Heart disease Other        parent, grandparent  . Colon cancer Paternal Aunt   . Breast cancer Maternal Grandmother   . Esophageal cancer Neg Hx   . Rectal cancer Neg Hx   . Stomach cancer Neg Hx     Review of Systems  Constitutional: Negative for fever.  Gastrointestinal: Negative for abdominal pain and nausea.  Genitourinary: Positive for frequency. Negative for dysuria and hematuria.  Musculoskeletal: Positive for back pain.  Neurological: Positive for headaches.       Objective:   Vitals:   01/01/17 1123  BP: (!) 156/94  Pulse: 87  Resp: 16  Temp: 98.1 F (36.7 C)  SpO2: 98%   Filed Weights   01/01/17 1123  Weight: 178 lb (80.7 kg)   Body mass index is 26.67 kg/m.  Wt Readings from Last 3 Encounters:  01/01/17 178 lb (80.7 kg)  12/23/16 178 lb (80.7 kg)  12/09/16 179 lb 6.4 oz (81.4 kg)     Physical Exam        Assessment & Plan:   See Problem List for Assessment and Plan of chronic medical problems.

## 2017-01-01 NOTE — Assessment & Plan Note (Signed)
Urine dip consistent with UTI Will send urine for culture Take the antibiotic as prescribed.   Take tylenol if needed.   Increase your water intake.  Call if no improvement   

## 2017-01-01 NOTE — Patient Instructions (Signed)
Take the antibiotic as prescribed.  Take tylenol if needed.  Increase your water intake.      Urinary Tract Infection, Adult A urinary tract infection (UTI) is an infection of any part of the urinary tract, which includes the kidneys, ureters, bladder, and urethra. These organs make, store, and get rid of urine in the body. UTI can be a bladder infection (cystitis) or kidney infection (pyelonephritis). What are the causes? This infection may be caused by fungi, viruses, or bacteria. Bacteria are the most common cause of UTIs. This condition can also be caused by repeated incomplete emptying of the bladder during urination. What increases the risk? This condition is more likely to develop if:  You ignore your need to urinate or hold urine for long periods of time.  You do not empty your bladder completely during urination.  You wipe back to front after urinating or having a bowel movement, if you are female.  You are uncircumcised, if you are female.  You are constipated.  You have a urinary catheter that stays in place (indwelling).  You have a weak defense (immune) system.  You have a medical condition that affects your bowels, kidneys, or bladder.  You have diabetes.  You take antibiotic medicines frequently or for long periods of time, and the antibiotics no longer work well against certain types of infections (antibiotic resistance).  You take medicines that irritate your urinary tract.  You are exposed to chemicals that irritate your urinary tract.  You are female.  What are the signs or symptoms? Symptoms of this condition include:  Fever.  Frequent urination or passing small amounts of urine frequently.  Needing to urinate urgently.  Pain or burning with urination.  Urine that smells bad or unusual.  Cloudy urine.  Pain in the lower abdomen or back.  Trouble urinating.  Blood in the urine.  Vomiting or being less hungry than normal.  Diarrhea or  abdominal pain.  Vaginal discharge, if you are female.  How is this diagnosed? This condition is diagnosed with a medical history and physical exam. You will also need to provide a urine sample to test your urine. Other tests may be done, including:  Blood tests.  Sexually transmitted disease (STD) testing.  If you have had more than one UTI, a cystoscopy or imaging studies may be done to determine the cause of the infections. How is this treated? Treatment for this condition often includes a combination of two or more of the following:  Antibiotic medicine.  Other medicines to treat less common causes of UTI.  Over-the-counter medicines to treat pain.  Drinking enough water to stay hydrated.  Follow these instructions at home:  Take over-the-counter and prescription medicines only as told by your health care provider.  If you were prescribed an antibiotic, take it as told by your health care provider. Do not stop taking the antibiotic even if you start to feel better.  Avoid alcohol, caffeine, tea, and carbonated beverages. They can irritate your bladder.  Drink enough fluid to keep your urine clear or pale yellow.  Keep all follow-up visits as told by your health care provider. This is important.  Make sure to: ? Empty your bladder often and completely. Do not hold urine for long periods of time. ? Empty your bladder before and after sex. ? Wipe from front to back after a bowel movement if you are female. Use each tissue one time when you wipe. Contact a health care provider if:    You have back pain.  You have a fever.  You feel nauseous or vomit.  Your symptoms do not get better after 3 days.  Your symptoms go away and then return. Get help right away if:  You have severe back pain or lower abdominal pain.  You are vomiting and cannot keep down any medicines or water. This information is not intended to replace advice given to you by your health care provider.  Make sure you discuss any questions you have with your health care provider. Document Released: 12/19/2004 Document Revised: 08/23/2015 Document Reviewed: 01/30/2015 Elsevier Interactive Patient Education  2017 Elsevier Inc.  

## 2017-01-02 LAB — URINE CULTURE
MICRO NUMBER:: 81129111
SPECIMEN QUALITY:: ADEQUATE

## 2017-01-06 ENCOUNTER — Telehealth: Payer: Self-pay | Admitting: Internal Medicine

## 2017-01-06 MED ORDER — AMOXICILLIN-POT CLAVULANATE 875-125 MG PO TABS: 1.0000 | ORAL_TABLET | Freq: Two times a day (BID) | ORAL | 0 refills | Status: DC

## 2017-01-06 NOTE — Telephone Encounter (Signed)
Pt returned your call. I gave her MD response. She said that she is no longer experiencing back pain but her urine is still cloudy mostly in the morning. When she drinks water is appears to clear up throughout the day.

## 2017-01-06 NOTE — Telephone Encounter (Signed)
Sent new antibiotic in - have her take that and call if her symptoms do not resolve completely

## 2017-01-07 MED ORDER — AMOXICILLIN-POT CLAVULANATE 875-125 MG PO TABS: 1.0000 | ORAL_TABLET | Freq: Two times a day (BID) | ORAL | 0 refills | Status: DC

## 2017-01-07 NOTE — Telephone Encounter (Signed)
Spoke with pt to inform. RX sent to [facility] in [place] at request of pt.

## 2017-01-20 ENCOUNTER — Ambulatory Visit: Payer: Self-pay

## 2017-01-20 ENCOUNTER — Encounter: Payer: Self-pay | Admitting: Family Medicine

## 2017-01-20 ENCOUNTER — Ambulatory Visit (INDEPENDENT_AMBULATORY_CARE_PROVIDER_SITE_OTHER): Payer: Medicare Other | Admitting: Family Medicine

## 2017-01-20 VITALS — BP 150/74 | HR 68 | Ht 68.0 in | Wt 177.0 lb

## 2017-01-20 DIAGNOSIS — M79604 Pain in right leg: Secondary | ICD-10-CM

## 2017-01-20 DIAGNOSIS — S86011D Strain of right Achilles tendon, subsequent encounter: Secondary | ICD-10-CM | POA: Diagnosis not present

## 2017-01-20 NOTE — Progress Notes (Signed)
[name] [address] [place], [place] [address] Phone: [phone] Subjective:     CC: Right ankle pain  HCW:[id number]  Sara [name] is a 71 y.o. female coming in with complaint of right achilles pain. Was in a boot for 6 weeks. Hasn't worn the boot for 5 days.   Onset- July  Location- Achilles Patient was found to have a fairly large tear. Is making improvement. Feels like she is about 80% better than where she started.    Past Medical History:  Diagnosis Date  . Barrett's esophagus   . GERD (gastroesophageal reflux disease)    hx of   . HIATAL HERNIA    s/p surgical repair  . HYPERTENSION   . Kidney stone    1990s   Past Surgical History:  Procedure Laterality Date  . CATARACT EXTRACTION  2009   both eyes  . CHOLECYSTECTOMY  11/02/2012   Procedure: CHOLECYSTECTOMY;  Surgeon: Pedro Earls, MD;  Location: [facility];  Service: General;;  . [place] REPAIR N/A 06/12/2012   Procedure: ANTERIOR REPAIR (CYSTOCELE);  Surgeon: Elveria Royals, MD;  Location: [facility];  Service: Gynecology;  Laterality: N/A;  . HIATAL HERNIA REPAIR  2011  . LITHOTRIPSY    . TONSILLECTOMY  1968  . UPPER GASTROINTESTINAL ENDOSCOPY    . UPPER GI ENDOSCOPY  11/02/2012   Procedure: UPPER GI ENDOSCOPY;  Surgeon: Pedro Earls, MD;  Location: [facility];  Service: General;;  . VAGINAL HYSTERECTOMY N/A 06/12/2012   Procedure: HYSTERECTOMY VAGINAL;  Surgeon: Elveria Royals, MD;  Location: [facility];  Service: Gynecology;  Laterality: N/A;   Social History   Social History  . Marital status: Widowed    Spouse name: N/A  . Number of children: N/A  . Years of education: 22   Occupational History  . Retired    Social History Main Topics  . Smoking status: Never Smoker  . Smokeless tobacco: Never Used     Comment: widowed since late 2011 (spouse had mod-adv dementia) 5 grown children, 3 nearby-,many g-kids  . Alcohol use No  . Drug use: No  . Sexual activity:  Yes    Birth control/ protection: Post-menopausal   Other Topics Concern  . Not on file   Social History Narrative  . No narrative on file   No Known Allergies Family History  Problem Relation Age of Onset  . Cancer Mother   . Hypertension Other        Parent  . Breast cancer Other   . Heart disease Other        parent, grandparent  . Colon cancer Paternal Aunt   . Breast cancer Maternal Grandmother   . Esophageal cancer Neg Hx   . Rectal cancer Neg Hx   . Stomach cancer Neg Hx      Past medical history, social, surgical and family history all reviewed in electronic medical record.  No pertanent information unless stated regarding to the chief complaint.   Review of Systems:Review of systems updated and as accurate as of 01/20/17  No headache, visual changes, nausea, vomiting, diarrhea, constipation, dizziness, abdominal pain, skin rash, fevers, chills, night sweats, weight loss, swollen lymph nodes, body aches, joint swelling, muscle aches, chest pain, shortness of breath, mood changes.   Objective  There were no vitals taken for this visit. Systems examined below as of 01/20/17   General: No apparent distress alert and oriented x3 mood and affect normal, dressed appropriately.  HEENT: Pupils equal, extraocular movements intact  Respiratory: Patient's speak in full sentences and does not appear short of breath  Cardiovascular: No lower extremity edema, non tender, no erythema  Skin: Warm dry intact with no signs of infection or rash on extremities or on axial skeleton.  Abdomen: Soft nontender  Neuro: Cranial nerves II through XII are intact, neurovascularly intact in all extremities with 2+ DTRs and 2+ pulses.  Lymph: No lymphadenopathy of posterior or anterior cervical chain or axillae bilaterally.  Gait Mild antalgic gait MSK:  Non tender with full range of motion and good stability and symmetric strength and tone of shoulders, elbows, wrist, hip, knees bilaterally.    Patient's ankle exam bilaterally shows the patient does have moderate arthritic changes. Soreness of the Achilles and the insertion on the right noted. Patient is mildly tight with mild atrophy of the calf compared to the contralateral side.  MSK US performed of: Right ankle This study was ordered, performed, and interpreted by Charlann Boxer D.O.  Foot/Ankle:   All structures visualized.   Talar dome unremarkable  Ankle mortise without effusion. Moderate arthritic changes Achilles tendon is down now to 1.30 cm. This is better. Decreased amount of abnormal vascularity. Patient though does have a neovascularity  from previous images. Minimal hypoechoic changes  IMPRESSION:   interval healing of a Achilles tendinitis     Impression and Recommendations:     This case required medical decision making of moderate complexity.      Note: This dictation was prepared with Dragon dictation along with smaller phrase technology. Any transcriptional errors that result from this process are unintentional.

## 2017-01-20 NOTE — Patient Instructions (Signed)
Good to meet you  I am impressed  Stay active.  Try the new brace daily  Ice 20 minutes 2 times daily. Usually after activity and before bed. Continue the nitro  PT will be calling you See me again in 4 weeks to make sure you are all the way there!

## 2017-01-20 NOTE — Assessment & Plan Note (Signed)
Patient has done very well with conservative therapy. Started patient and physical therapy at this time. Continue bracing but not in the [place] anymore. Patient given a pneumatic brace. Trial topical anti-inflammatories and encourage continuing the medical history. Follow-up again in 4 weeks

## 2017-01-22 ENCOUNTER — Ambulatory Visit: Payer: Medicare Other | Attending: Family Medicine | Admitting: Physical Therapy

## 2017-01-22 ENCOUNTER — Encounter: Payer: Self-pay | Admitting: Physical Therapy

## 2017-01-22 DIAGNOSIS — M6281 Muscle weakness (generalized): Secondary | ICD-10-CM | POA: Diagnosis not present

## 2017-01-22 DIAGNOSIS — M79661 Pain in right lower leg: Secondary | ICD-10-CM | POA: Insufficient documentation

## 2017-01-22 DIAGNOSIS — R262 Difficulty in walking, not elsewhere classified: Secondary | ICD-10-CM | POA: Diagnosis not present

## 2017-01-22 NOTE — Patient Instructions (Addendum)
Achilles / Gastroc, Standing    Stand, right foot behind, heel on floor and turned slightly out, leg straight, forward leg bent. Move hips forward. Hold _30__ seconds. Repeat __3_ times per session. Do _1__ sessions per day.  Copyright  VHI. All rights reserved.   Achilles / Soleus, Standing    Stand, right foot behind, heel on floor and turned slightly out. Lower hips and bend knees. Hold _30__ seconds. Repeat __3_ times per session. Do _1__ sessions per day.  Copyright  VHI. All rights reserved.     Ankle Inversion with Theraband  Begin with foot pointed up and out. Move the ankle down and in against the resistance band in a straight diagonal, as if you are making half of an X. Slowly and with control, return to the starting position  2x10     Ankle Eversion with Theraband  Start with the foot pointed up and inward. Then, move ankle down and out in a straight diagonal against the resistance band.  Think of making half of an X. Slowly and with control return back to the starting position  2x10    ELASTIC BAND DORSIFLEXION - SUPINE  You can perform this lying on the floor face-up. Anchor one end of the elastic band in a door (tie a knot in the band and close a door on the band so that the knot is on the other side of the door).   Scoot back until there is tension on the band. Once there is some tension, move your ankle so that your toes and foot pull back and upwards towards pointing to the ceiling. Return to starting position and repeat.  Repeat 2 x 10     Bilateral Eccentric Heel Raise  Standing with the balls of feet on box and heels hanging off stretching to the floor. Raises up on toes, bringing heels as high as you can. Slowly return to starting position.   Do 3 sets of 10

## 2017-01-22 NOTE — Therapy (Signed)
[facility] [address], [place], [place], [address] Phone: [phone]   Fax:  [phone]  Physical Therapy Evaluation  Patient Details  Name: Sara [name] MRN: [id number] Date of Birth: [birth year] Referring Provider: Lyndal Pulley, DO  Encounter Date: 01/22/2017      PT End of Session - 01/22/17 0900    Visit Number 1   Number of Visits 10   Date for PT Re-Evaluation 03/05/17   Authorization Type medicare   PT Start Time 0830   PT Stop Time 0915   PT Time Calculation (min) 45 min   Activity Tolerance Patient tolerated treatment well   Behavior During Therapy [facility] for tasks assessed/performed      Past Medical History:  Diagnosis Date  . Barrett's esophagus   . GERD (gastroesophageal reflux disease)    hx of   . HIATAL HERNIA    s/p surgical repair  . HYPERTENSION   . Kidney stone    1990s    Past Surgical History:  Procedure Laterality Date  . CATARACT EXTRACTION  2009   both eyes  . CHOLECYSTECTOMY  11/02/2012   Procedure: CHOLECYSTECTOMY;  Surgeon: Pedro Earls, MD;  Location: [facility];  Service: General;;  . [place] REPAIR N/A 06/12/2012   Procedure: ANTERIOR REPAIR (CYSTOCELE);  Surgeon: Elveria Royals, MD;  Location: [facility];  Service: Gynecology;  Laterality: N/A;  . HIATAL HERNIA REPAIR  2011  . LITHOTRIPSY    . TONSILLECTOMY  1968  . UPPER GASTROINTESTINAL ENDOSCOPY    . UPPER GI ENDOSCOPY  11/02/2012   Procedure: UPPER GI ENDOSCOPY;  Surgeon: Pedro Earls, MD;  Location: [facility];  Service: General;;  . VAGINAL HYSTERECTOMY N/A 06/12/2012   Procedure: HYSTERECTOMY VAGINAL;  Surgeon: Elveria Royals, MD;  Location: [facility];  Service: Gynecology;  Laterality: N/A;    There were no vitals filed for this visit.       Subjective Assessment - 01/22/17 0835    Subjective Tender to touch on the achilles, but it doesn't hurt with walking only when touching it.  Pt is currently  wearing an air brace.  Pt states she is doing well just wanted to make sure it is healing well and she is strong   Patient Stated Goals I want to make sure the tendon and leg is strong   Currently in Pain? 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     Prior Function   Level of Independence Independent   Vocation Part time employment  at [employer] walking and taking care of grandchildren     Cognition   Overall Cognitive Status Within Functional Limits for tasks assessed     Observation/Other Assessments   Focus on Therapeutic Outcomes (FOTO)  27% limited     Posture/Postural Control   Posture/Postural Control Postural limitations   Postural Limitations Rounded Shoulders     ROM / Strength   AROM / PROM / Strength PROM;Strength     PROM   Right Ankle Dorsiflexion 5   Left Ankle Dorsiflexion 3     Strength   Right/Left Hip Right;Left   Right Hip Flexion 4+/5   Right Hip External Rotation  4/5   Right Hip ABduction 4+/5   Right Hip ADduction 4/5   Left Hip Flexion 5/5   Left Hip ABduction 4+/5   Left Hip ADduction 4+/5   Right/Left Ankle Right;Left   Right Ankle Dorsiflexion 4-/5   Right Ankle Plantar Flexion 4-/5   Right Ankle Inversion 4-/5   Right Ankle Eversion 4-/5   Left Ankle Dorsiflexion 5/5   Left Ankle Plantar Flexion 5/5   Left Ankle Inversion 5/5   Left Ankle Eversion 5/5     Palpation   Palpation comment tenderness  at achilles midway between gastroc/soeus and calcaneal attachment     Ambulation/Gait   Gait Pattern Within Functional Limits            Objective measurements completed on examination: See above findings.          [place] Adult PT Treatment/Exercise - 01/22/17 0001      Exercises   Exercises Ankle  educated and performed HEP                PT Education - 01/22/17 0918    Education provided Yes   Education Details tband exercises, gastroc, soleus stretches, eccentric calf raise   Person(s) Educated Patient   Methods Explanation;Demonstration;Handout;Verbal cues;Tactile cues   Comprehension Verbalized understanding;Returned demonstration          PT Short Term Goals - 01/22/17 0931      PT SHORT TERM GOAL #1   Title Pt will be ind with initial HEP   Baseline given at eval   Time 4   Period Weeks   Status New   Target Date 02/19/17     PT SHORT TERM GOAL #2   Title pt will increase ankle dorsiflexion to 10 deg rigth LE   Time 4   Period Weeks   Status New   Target Date 02/19/17     PT SHORT TERM GOAL #3   Title pt will be able to perform eccentric heel raise 10x on right LE without pain   Time 4   Period Weeks   Status New   Target Date 02/19/17           PT Long Term Goals - 01/22/17 0933      PT LONG TERM GOAL #1   Title ind with advanced HEP   Time 6   Period Weeks   Status New   Target Date 03/05/17     PT LONG TERM GOAL #2   Title FOTO < or = to 20% limited   Time 6   Period Weeks   Status New   Target Date 03/05/17     PT LONG TERM GOAL #3   Title pt will be able to make it through full shift at work without limping   Time 6   Period Weeks   Status New   Target Date 03/05/17     PT LONG TERM GOAL #4   Title pt will be able to go up and down steps reciprocally without being limited by pain due to increased  endurance   Time 6   Period Weeks   Status New   Target Date 03/05/17                Plan -  February 21, 2017 4098    Clinical Impression Statement Patient presents to clinic with achilles tendonitis with partial tear.  Patient was wearing a boot for 6 weeks and is now wearing brace.  She is having no pain other than after walking for a long time.  She works part time in [employer] and will begin limping by the end of the shift.  She is still having difficulty going up and down the stairs as much as she normally would when caring for her grandchildren.  Pt has some weakness in right ankle and hip.  She has some gait abnormalities which occur after prolongued walking.  Pt has decreased ankle ROM bilaterally.  She will benefit from skilled PT to address impairments and improve strength and endurance in order to return to all functional activities.   Clinical Presentation Stable   Clinical Presentation due to: pt's condition is stable   Clinical Decision Making Low   Rehab Potential Excellent   PT Frequency 2x / week   PT Duration 6 weeks   PT Treatment/Interventions ADLs/Self Care Home Management;Biofeedback;Cryotherapy;Electrical Stimulation;Iontophoresis 4mg /ml Dexamethasone;Moist Heat;Ultrasound;Gait training;Stair training;Functional mobility training;Therapeutic activities;Therapeutic exercise;Balance training;Neuromuscular re-education;Patient/family education;Manual techniques;Passive range of motion;Dry needling;Taping;Vasopneumatic Device   PT Next Visit Plan progress LE strength, ankle dorsiflexion   Consulted and Agree with Plan of Care Patient      Patient will benefit from skilled therapeutic intervention in order to improve the following deficits and impairments:  Decreased strength, Difficulty walking, Pain, Decreased endurance  Visit Diagnosis: Muscle weakness (generalized) - Plan: PT plan of care cert/re-cert  Difficulty in walking, not elsewhere classified - Plan: PT plan of care cert/re-cert  Pain in right lower leg - Plan: PT plan of care cert/re-cert      G-Codes -  February 21, 2017 0920    Functional Limitation Mobility: Walking and moving around   Mobility: Walking and Moving Around Current Status [phone]) At least 20 percent but less than 40 percent impaired, limited or restricted   Mobility: Walking and Moving Around Goal Status [phone]) At least 20 percent but less than 40 percent impaired, limited or restricted       Problem List Patient Active Problem List   Diagnosis Date Noted  . Peroneal tendinitis of right lower extremity 12/09/2016  . Partial tear of right Achilles tendon 11/15/2016  . Achilles tendinitis of right lower extremity 10/30/2016  . SI joint arthritis ([place]) 06/17/2016  . Epigastric pain 05/19/2016  . Atypical chest pain 05/10/2016  . Renal insufficiency 12/16/2015  . Low back pain 10/06/2015  . UTI (urinary tract infection) 10/06/2015  . S/P laparoscopic cholecystectomy-for advanced necrotic gallbladder August 2014 11/05/2012  . DEPRESSION, SITUATIONAL 02/20/2009  . GERD 02/20/2009  . RENAL CALCULUS, HX OF 02/20/2009  . Essential hypertension 02/14/2009  . Diaphragmatic hernia 02/14/2009  . SYNCOPE 02/05/2009    Zannie Cove, PT February 21, 2017, 10:09 AM  [facility] [address], [place], [place], [address] Phone: [phone]   Fax:  [phone]  Name: Sara [name] MRN: [id number] Date of Birth: [birth year]

## 2017-01-27 ENCOUNTER — Ambulatory Visit: Payer: Medicare Other | Attending: Family Medicine | Admitting: Physical Therapy

## 2017-01-27 DIAGNOSIS — R262 Difficulty in walking, not elsewhere classified: Secondary | ICD-10-CM | POA: Diagnosis not present

## 2017-01-27 DIAGNOSIS — M79661 Pain in right lower leg: Secondary | ICD-10-CM | POA: Diagnosis not present

## 2017-01-27 DIAGNOSIS — M6281 Muscle weakness (generalized): Secondary | ICD-10-CM | POA: Diagnosis not present

## 2017-01-27 NOTE — Therapy (Signed)
[facility] [address] [place], [place], [address] Phone: [phone]   Fax:  [phone]  Physical Therapy Treatment  Patient Details  Name: Sara [name] MRN: [id number] Date of Birth: [birth year] Referring Provider: Lyndal Pulley, DO   Encounter Date: 01/27/2017  PT End of Session - 01/27/17 1505    Visit Number  2    Number of Visits  10    Date for PT Re-Evaluation  03/05/17    Authorization Type  medicare    PT Start Time  1503    PT Stop Time  1541    PT Time Calculation (min)  38 min    Activity Tolerance  Patient tolerated treatment well    Behavior During Therapy  [facility] for tasks assessed/performed       Past Medical History:  Diagnosis Date  . Barrett's esophagus   . GERD (gastroesophageal reflux disease)    hx of   . HIATAL HERNIA    s/p surgical repair  . HYPERTENSION   . Kidney stone    1990s    Past Surgical History:  Procedure Laterality Date  . CATARACT EXTRACTION  2009   both eyes  . HIATAL HERNIA REPAIR  2011  . LITHOTRIPSY    . TONSILLECTOMY  1968  . UPPER GASTROINTESTINAL ENDOSCOPY      There were no vitals filed for this visit.  Subjective Assessment - 01/27/17 1506    Subjective  Doing great, HEP going well. No limping at end of day. No real pain, a little soreness at that "spot."    Currently in Pain?  Yes    Pain Score  2     Pain Location  Ankle    Pain Orientation  Right    Pain Descriptors / Indicators  Sore    Multiple Pain Sites  No                      [facility] Adult PT Treatment/Exercise - 01/27/17 0001      Ankle Exercises: Seated   Other Seated Ankle Exercises  Rocker board x 4 min      Ankle Exercises: Stretches   Slant Board Stretch  3 reps;20 seconds    Other Stretch  DF stretch with strap 3x 20 sec       Ankle Exercises: Supine   T-Band  Red, review 10x each       Ankle Exercises: Standing   Heel Raises  20 reps    Other  Standing Ankle Exercises  resisted walking 10x frwd/bkward 2 plates light CGA   light CGA     Ankle Exercises: Aerobic   Stationary Bike  Nustep L1 x 6 min PTA present   PTA present              PT Short Term Goals - 01/27/17 1523      PT SHORT TERM GOAL #1   Title  Pt will be ind with initial HEP    Baseline  given at eval    Time  4    Period  Weeks    Status  Achieved        PT Long Term Goals - 01/22/17 3419      PT LONG TERM GOAL #1   Title  ind with advanced HEP    Time  6    Period  Weeks    Status  New    Target Date  03/05/17  PT LONG TERM GOAL #2   Title  FOTO < or = to 20% limited    Time  6    Period  Weeks    Status  New    Target Date  03/05/17      PT LONG TERM GOAL #3   Title  pt will be able to make it through full shift at work without limping    Time  6    Period  Weeks    Status  New    Target Date  03/05/17      PT LONG TERM GOAL #4   Title  pt will be able to go up and down steps reciprocally without being limited by pain due to increased endurance    Time  6    Period  Weeks    Status  New    Target Date  03/05/17            Plan - 01/27/17 1506    Clinical Impression Statement  Pt is compliant with her initial HEP. She has not recently had any pain, soreness, or limp after working in [employer]. She has also been able to ascend and descend the stairs better/reciprocally. Pt tolerated all standing exercises, stretches, and resisted exercises well, no pain.     Rehab Potential  Excellent    PT Frequency  2x / week    PT Duration  6 weeks    PT Treatment/Interventions  ADLs/Self Care Home Management;Biofeedback;Cryotherapy;Electrical Stimulation;Iontophoresis 4mg /ml Dexamethasone;Moist Heat;Ultrasound;Gait training;Stair training;Functional mobility training;Therapeutic activities;Therapeutic exercise;Balance training;Neuromuscular re-education;Patient/family education;Manual techniques;Passive range of motion;Dry  needling;Taping;Vasopneumatic Device    PT Next Visit Plan  progress LE strength, ankle dorsiflexion, measure for goals in next 1-2 sessions.     Consulted and Agree with Plan of Care  --       Patient will benefit from skilled therapeutic intervention in order to improve the following deficits and impairments:  Decreased strength, Difficulty walking, Pain, Decreased endurance  Visit Diagnosis: Muscle weakness (generalized)  Difficulty in walking, not elsewhere classified  Pain in right lower leg     Problem List Patient Active Problem List   Diagnosis Date Noted  . Peroneal tendinitis of right lower extremity 12/09/2016  . Partial tear of right Achilles tendon 11/15/2016  . Achilles tendinitis of right lower extremity 10/30/2016  . SI joint arthritis ([place]) 06/17/2016  . Epigastric pain 05/19/2016  . Atypical chest pain 05/10/2016  . Renal insufficiency 12/16/2015  . Low back pain 10/06/2015  . UTI (urinary tract infection) 10/06/2015  . S/P laparoscopic cholecystectomy-for advanced necrotic gallbladder August 2014 11/05/2012  . DEPRESSION, SITUATIONAL 02/20/2009  . GERD 02/20/2009  . RENAL CALCULUS, HX OF 02/20/2009  . Essential hypertension 02/14/2009  . Diaphragmatic hernia 02/14/2009  . SYNCOPE 02/05/2009    Srihitha Tagliaferri, PTA 01/27/2017, 3:39 PM  [facility] [address], [place], [place], [address] Phone: [phone]   Fax:  [phone]  Name: Jasma Seevers MRN: [id number] Date of Birth: [birth year]

## 2017-01-29 ENCOUNTER — Ambulatory Visit: Payer: Medicare Other | Admitting: Physical Therapy

## 2017-01-29 ENCOUNTER — Encounter: Payer: Self-pay | Admitting: Physical Therapy

## 2017-01-29 DIAGNOSIS — R262 Difficulty in walking, not elsewhere classified: Secondary | ICD-10-CM | POA: Diagnosis not present

## 2017-01-29 DIAGNOSIS — M79661 Pain in right lower leg: Secondary | ICD-10-CM | POA: Diagnosis not present

## 2017-01-29 DIAGNOSIS — M6281 Muscle weakness (generalized): Secondary | ICD-10-CM | POA: Diagnosis not present

## 2017-01-29 NOTE — Therapy (Signed)
[facility] [address] [place], [place], [address] Phone: [phone]   Fax:  [phone]  Physical Therapy Treatment  Patient Details  Name: Sara [name] MRN: [id number] Date of Birth: 08-09-1945 Referring Provider: Lyndal Pulley, DO   Encounter Date: 01/29/2017  PT End of Session - 01/29/17 1522    Visit Number  3    Number of Visits  10    Date for PT Re-Evaluation  03/05/17    Authorization Type  medicare    PT Start Time  6962    PT Stop Time  1611    PT Time Calculation (min)  48 min    Activity Tolerance  Patient tolerated treatment well    Behavior During Therapy  [facility] for tasks assessed/performed       Past Medical History:  Diagnosis Date  . Barrett's esophagus   . GERD (gastroesophageal reflux disease)    hx of   . HIATAL HERNIA    s/p surgical repair  . HYPERTENSION   . Kidney stone    1990s    Past Surgical History:  Procedure Laterality Date  . CATARACT EXTRACTION  2009   both eyes  . HIATAL HERNIA REPAIR  2011  . LITHOTRIPSY    . TONSILLECTOMY  1968  . UPPER GASTROINTESTINAL ENDOSCOPY      There were no vitals filed for this visit.  Subjective Assessment - 01/29/17 1525    Subjective  Only pain is if I touch the inner part of my ankle.     Currently in Pain?  No/denies    Multiple Pain Sites  No         [facility] PT Assessment - 01/29/17 0001      PROM   Right Ankle Dorsiflexion  10                  [facility] Adult PT Treatment/Exercise - 01/29/17 0001      Ankle Exercises: Seated   Other Seated Ankle Exercises  Rocker board x 4 min      Ankle Exercises: Stretches   Slant Board Stretch  3 reps;20 seconds    Other Stretch  DF stretch with strap 3x 20 sec     Other Stretch  DF stretch on second step 20x      Ankle Exercises: Standing   SLS  On teal pod 3x 15 sec  No UE   No UE   Rebounder  Marching x 1 min  No UE support   No UE support   Heel Raises   20 reps    Heel Walk (Round Trip)  30 feet 2x, difficult on RT    Other Standing Ankle Exercises  resisted walking 10x frwd/bkward 2 plates light CGA   light CGA   Other Standing Ankle Exercises  BOSU step ups 2x 10 3 finger support   3 finger support     Ankle Exercises: Aerobic   Stationary Bike  Nustep L1 x 8 min PTA present   PTA present              PT Short Term Goals - 01/29/17 1607      PT SHORT TERM GOAL #2   Title  pt will increase ankle dorsiflexion to 10 deg rigth LE    Time  4    Period  Weeks    Status  Achieved 10 degrees with ease   10 degrees with ease  PT SHORT TERM GOAL #3   Title  pt will be able to perform eccentric heel raise 10x on right LE without pain    Time  4    Period  Weeks    Status  Achieved        PT Long Term Goals - 01/29/17 1527      PT LONG TERM GOAL #1   Title  ind with advanced HEP    Time  6    Period  Weeks    Status  On-going      PT LONG TERM GOAL #3   Title  pt will be able to make it through full shift at work without limping    Time  6    Period  Weeks    Status  Achieved      PT LONG TERM GOAL #4   Title  pt will be able to go up and down steps reciprocally without being limited by pain due to increased endurance    Time  6    Period  Weeks    Status  Partially Met Doing fairly consistently, but will hold off on checking goal as being met until it is more solid.    Doing fairly consistently, but will hold off on checking goal as being met until it is more solid.            Plan - 01/29/17 1608    Clinical Impression Statement  All short term goals met this week. She has achieved 10 degrees of ankle dorsiflexion and does not limp after work shift. She can do a pain free eccentric heel lift at this time. Heel walking is difficult to do/maontain for 30 feet which appears to be from weakness. This exercise was suggested for to sprinkle in her day a little as it tended to bother her patella being in  compressed position.     Rehab Potential  Excellent    PT Frequency  2x / week    PT Duration  6 weeks    PT Treatment/Interventions  ADLs/Self Care Home Management;Biofeedback;Cryotherapy;Electrical Stimulation;Iontophoresis '4mg'$ /ml Dexamethasone;Moist Heat;Ultrasound;Gait training;Stair training;Functional mobility training;Therapeutic activities;Therapeutic exercise;Balance training;Neuromuscular re-education;Patient/family education;Manual techniques;Passive range of motion;Dry needling;Taping;Vasopneumatic Device    PT Next Visit Plan  progress LE strength, ankle dorsiflexion strength.     Consulted and Agree with Plan of Care  Patient       Patient will benefit from skilled therapeutic intervention in order to improve the following deficits and impairments:  Decreased strength, Difficulty walking, Pain, Decreased endurance  Visit Diagnosis: Muscle weakness (generalized)  Difficulty in walking, not elsewhere classified  Pain in right lower leg     Problem List Patient Active Problem List   Diagnosis Date Noted  . Peroneal tendinitis of right lower extremity 12/09/2016  . Partial tear of right Achilles tendon 11/15/2016  . Achilles tendinitis of right lower extremity 10/30/2016  . SI joint arthritis ([place]) 06/17/2016  . Epigastric pain 05/19/2016  . Atypical chest pain 05/10/2016  . Renal insufficiency 12/16/2015  . Low back pain 10/06/2015  . UTI (urinary tract infection) 10/06/2015  . S/P laparoscopic cholecystectomy-for advanced necrotic gallbladder August 2014 11/05/2012  . DEPRESSION, SITUATIONAL 02/20/2009  . GERD 02/20/2009  . RENAL CALCULUS, HX OF 02/20/2009  . Essential hypertension 02/14/2009  . Diaphragmatic hernia 02/14/2009  . SYNCOPE 02/05/2009    Johanna Matto, PTA 01/29/2017, 4:12 PM  [facility] [address], [place], [place], [address] Phone: [phone]  Fax:  [phone]  Name:  Misbah Hornaday MRN: [id number] Date of Birth: 1946/03/24

## 2017-02-03 ENCOUNTER — Ambulatory Visit: Payer: Medicare Other | Admitting: Physical Therapy

## 2017-02-03 ENCOUNTER — Encounter: Payer: Self-pay | Admitting: Physical Therapy

## 2017-02-03 DIAGNOSIS — M79661 Pain in right lower leg: Secondary | ICD-10-CM

## 2017-02-03 DIAGNOSIS — M6281 Muscle weakness (generalized): Secondary | ICD-10-CM

## 2017-02-03 DIAGNOSIS — R262 Difficulty in walking, not elsewhere classified: Secondary | ICD-10-CM | POA: Diagnosis not present

## 2017-02-03 NOTE — Therapy (Addendum)
[facility] [address] [place], [place], [address] Phone: [phone]   Fax:  [phone]  Physical Therapy Treatment  Patient Details  Name: Sara [name] MRN: [id number] Date of Birth: Oct 16, 1945 Referring Provider: Lyndal Pulley, DO   Encounter Date: 02/03/2017  PT End of Session - 02/03/17 1451    Visit Number  4    Number of Visits  10    Date for PT Re-Evaluation  03/05/17    Authorization Type  medicare    PT Start Time  1450    PT Stop Time  1528    PT Time Calculation (min)  38 min    Activity Tolerance  Patient tolerated treatment well    Behavior During Therapy  [facility] for tasks assessed/performed       Past Medical History:  Diagnosis Date  . Barrett's esophagus   . GERD (gastroesophageal reflux disease)    hx of   . HIATAL HERNIA    s/p surgical repair  . HYPERTENSION   . Kidney stone    1990s    Past Surgical History:  Procedure Laterality Date  . CATARACT EXTRACTION  2009   both eyes  . HIATAL HERNIA REPAIR  2011  . LITHOTRIPSY    . TONSILLECTOMY  1968  . UPPER GASTROINTESTINAL ENDOSCOPY      There were no vitals filed for this visit.  Subjective Assessment - 02/03/17 1452    Subjective  Had a family memeber die suddenly over the weekend so I have not done much HEP.     Patient Stated Goals  I want to make sure the tendon and leg is strong    Currently in Pain?  No/denies    Multiple Pain Sites  No                      [facility] Adult PT Treatment/Exercise - 02/03/17 0001      Ankle Exercises: Stretches   Slant Board Stretch  3 reps;20 seconds    Other Stretch  DF stretch with strap 3x 20 sec     Other Stretch  DF stretch on second step 20x      Ankle Exercises: Seated   Other Seated Ankle Exercises  Rocker board x 4 min      Ankle Exercises: Standing   SLS  On teal pod 3x 20sec  No UE    Heel Walk (Round Trip)  30 feet 2x,    Other Standing Ankle  Exercises  resisted walking 3 plates 10x frwd/bkward light CGA      Ankle Exercises: Aerobic   Stationary Bike  Nustep L2 x 10 min PTA present               PT Short Term Goals - 01/29/17 1607      PT SHORT TERM GOAL #2   Title  pt will increase ankle dorsiflexion to 10 deg rigth LE    Time  4    Period  Weeks    Status  Achieved 10 degrees with ease      PT SHORT TERM GOAL #3   Title  pt will be able to perform eccentric heel raise 10x on right LE without pain    Time  4    Period  Weeks    Status  Achieved        PT Long Term Goals - 02/03/17 1515      PT LONG TERM  GOAL #4   Title  pt will be able to go up and down steps reciprocally without being limited by pain due to increased endurance    Time  6    Period  Weeks    Status  Partially Met Solidly met now            Plan - 02/03/17 1451    Clinical Impression Statement  Pt has had a death in the family this weekend of whom she was the caregiver. For this reason she has not done any exercises or paid much attention to her ankle. During the session today she presented with increased unsteadiness that was more than likely due to fatigue than true weakeness.  She is currently weaning from her air brace when at work.     Rehab Potential  Excellent    PT Frequency  2x / week    PT Duration  6 weeks    PT Treatment/Interventions  ADLs/Self Care Home Management;Biofeedback;Cryotherapy;Electrical Stimulation;Iontophoresis '4mg'$ /ml Dexamethasone;Moist Heat;Ultrasound;Gait training;Stair training;Functional mobility training;Therapeutic activities;Therapeutic exercise;Balance training;Neuromuscular re-education;Patient/family education;Manual techniques;Passive range of motion;Dry needling;Taping;Vasopneumatic Device    PT Next Visit Plan  progress LE strength, ankle dorsiflexion strength.     Consulted and Agree with Plan of Care  Patient       Patient will benefit from skilled therapeutic intervention in order to  improve the following deficits and impairments:  Decreased strength, Difficulty walking, Pain, Decreased endurance  Visit Diagnosis: Muscle weakness (generalized)  Difficulty in walking, not elsewhere classified  Pain in right lower leg  Gcodes: walking - clinical reasoning Current CJ Goals CJ Discharge CJ   Problem List Patient Active Problem List   Diagnosis Date Noted  . Peroneal tendinitis of right lower extremity 12/09/2016  . Partial tear of right Achilles tendon 11/15/2016  . Achilles tendinitis of right lower extremity 10/30/2016  . SI joint arthritis ([place]) 06/17/2016  . Epigastric pain 05/19/2016  . Atypical chest pain 05/10/2016  . Renal insufficiency 12/16/2015  . Low back pain 10/06/2015  . UTI (urinary tract infection) 10/06/2015  . S/P laparoscopic cholecystectomy-for advanced necrotic gallbladder August 2014 11/05/2012  . DEPRESSION, SITUATIONAL 02/20/2009  . GERD 02/20/2009  . RENAL CALCULUS, HX OF 02/20/2009  . Essential hypertension 02/14/2009  . Diaphragmatic hernia 02/14/2009  . SYNCOPE 02/05/2009    Caffie Sotto, PTA 02/03/2017, 3:20 PM  [facility] [address] [place], [place], [address] Phone: [phone]   Fax:  [phone]  Name: Sara [name] MRN: [id number] Date of Birth: 1945-10-07  PHYSICAL THERAPY DISCHARGE SUMMARY  Visits from Start of Care: 4  Current functional level related to goals / functional outcomes: See above   Remaining deficits: See above   Education / Equipment: HEP  Plan: Patient agrees to discharge.  Patient goals were not met. Patient is being discharged due to not returning since the last visit.  ?????         [employer], PT 03/10/17 10:10 AM

## 2017-02-05 ENCOUNTER — Other Ambulatory Visit: Payer: Self-pay | Admitting: Internal Medicine

## 2017-02-05 ENCOUNTER — Encounter: Payer: Medicare Other | Admitting: Physical Therapy

## 2017-02-10 ENCOUNTER — Encounter: Payer: Medicare Other | Admitting: Physical Therapy

## 2017-02-12 ENCOUNTER — Encounter: Payer: Medicare Other | Admitting: Physical Therapy

## 2017-02-15 NOTE — Progress Notes (Signed)
[facility] [place], [place] [address] Phone: [phone] Subjective:    I'm seeing this patient by the request  of:    CC: Right Achilles follow-up  [id number]  Sara [name] is a 71 y.o. female coming in with complaint of she was seen previously and had more than her right Achilles tendinitis.  Has been doing formal physical therapy.  Last ultrasound showed it was decreased size at 1.3 cm.  Patient states doing significantly better.  Patient has been increasing activity.  Patient has been doing formal physical therapy.  Also working with daughter who is also a physical therapist.  Very happy overall.  90% better       Past Medical History:  Diagnosis Date  . Barrett's esophagus   . GERD (gastroesophageal reflux disease)    hx of   . HIATAL HERNIA    s/p surgical repair  . HYPERTENSION   . Kidney stone    1990s   Past Surgical History:  Procedure Laterality Date  . CATARACT EXTRACTION  2009   both eyes  . CHOLECYSTECTOMY  11/02/2012   Procedure: CHOLECYSTECTOMY;  Surgeon: Pedro Earls, MD;  Location: [facility];  Service: General;;  . [place] REPAIR N/A 06/12/2012   Procedure: ANTERIOR REPAIR (CYSTOCELE);  Surgeon: Elveria Royals, MD;  Location: [facility];  Service: Gynecology;  Laterality: N/A;  . HIATAL HERNIA REPAIR  2011  . LITHOTRIPSY    . TONSILLECTOMY  1968  . UPPER GASTROINTESTINAL ENDOSCOPY    . UPPER GI ENDOSCOPY  11/02/2012   Procedure: UPPER GI ENDOSCOPY;  Surgeon: Pedro Earls, MD;  Location: [facility];  Service: General;;  . VAGINAL HYSTERECTOMY N/A 06/12/2012   Procedure: HYSTERECTOMY VAGINAL;  Surgeon: Elveria Royals, MD;  Location: [facility];  Service: Gynecology;  Laterality: N/A;   Social History   Socioeconomic History  . Marital status: Widowed    Spouse name: None  . Number of children: None  . Years of education: 83  . Highest education level: None  Social Needs  . Financial resource strain:  None  . Food insecurity - worry: None  . Food insecurity - inability: None  . Transportation needs - medical: None  . Transportation needs - non-medical: None  Occupational History  . Occupation: Retired  Tobacco Use  . Smoking status: Never Smoker  . Smokeless tobacco: Never Used  . Tobacco comment: widowed since late 2011 (spouse had mod-adv dementia) 5 grown children, 3 nearby-,many g-kids  Substance and Sexual Activity  . Alcohol use: No  . Drug use: No  . Sexual activity: Yes    Birth control/protection: Post-menopausal  Other Topics Concern  . None  Social History Narrative  . None   No Known Allergies Family History  Problem Relation Age of Onset  . Cancer Mother   . Hypertension Other        Parent  . Breast cancer Other   . Heart disease Other        parent, grandparent  . Colon cancer Paternal Aunt   . Breast cancer Maternal Grandmother   . Esophageal cancer Neg Hx   . Rectal cancer Neg Hx   . Stomach cancer Neg Hx      Past medical history, social, surgical and family history all reviewed in electronic medical record.  No pertanent information unless stated regarding to the chief complaint.   Review of Systems:Review of systems updated and as accurate as of 02/17/17  No headache, visual changes, nausea, vomiting, diarrhea, constipation, dizziness, abdominal pain, skin rash, fevers, chills, night sweats, weight loss, swollen lymph nodes, body aches, joint swelling, muscle aches, chest pain, shortness of breath, mood changes.   Objective  Blood pressure 134/80, pulse 67, height 5\' 8"  (1.727 m), weight 178 lb (80.7 kg), SpO2 97 %. Systems examined below as of 02/17/17   General: No apparent distress alert and oriented x3 mood and affect normal, dressed appropriately.  HEENT: Pupils equal, extraocular movements intact  Respiratory: Patient's speak in full sentences and does not appear short of breath  Cardiovascular: No lower extremity edema, non tender, no  erythema  Skin: Warm dry intact with no signs of infection or rash on extremities or on axial skeleton.  Abdomen: Soft nontender  Neuro: Cranial nerves II through XII are intact, neurovascularly intact in all extremities with 2+ DTRs and 2+ pulses.  Lymph: No lymphadenopathy of posterior or anterior cervical chain or axillae bilaterally.  Gait normal with good balance and coordination.  MSK:  Non tender with full range of motion and good stability and symmetric strength and tone of shoulders, elbows, wrist, hip, knee bilaterally.  Very mild arthritic changes Ankle: Right Trace enlargement of the Achilles compared to the contralateral side still left Range of motion is full in all directions. Strength is 5/5 in all directions. Stable lateral and medial ligaments; squeeze test and kleiger test unremarkable; Talar dome nontender; No pain at base of 5th MT; No tenderness over cuboid; No tenderness over N spot or navicular prominence No tenderness on posterior aspects of lateral and medial malleolus No sign of peroneal tendon subluxations or tenderness to palpation Negative tarsal tunnel tinel's Able to walk 4 steps.   Impression and Recommendations:     This case required medical decision making of moderate complexity.      Note: This dictation was prepared with Dragon dictation along with smaller phrase technology. Any transcriptional errors that result from this process are unintentional.

## 2017-02-17 ENCOUNTER — Encounter: Payer: Self-pay | Admitting: Family Medicine

## 2017-02-17 ENCOUNTER — Ambulatory Visit: Payer: Medicare Other | Admitting: Family Medicine

## 2017-02-17 DIAGNOSIS — S86011D Strain of right Achilles tendon, subsequent encounter: Secondary | ICD-10-CM

## 2017-02-17 NOTE — Patient Instructions (Signed)
Good to see you  Continue the nitro daily for 4 weeks Then 3 times a week for 2 weeks  Continue the exercises at least 2 times a week  Continue with PT if you need it.  See me again in 6-8 weeks Happy holidays!

## 2017-02-17 NOTE — Assessment & Plan Note (Signed)
Patient is doing very well.  Some mild increase in neovascularization still seen icing regimen, discussed changing shoes.  Encouraged to continue with home exercises. This patient is well follow-up again in 6-8 weeks.  Worsening symptoms to go back to more aggressive therapy.  I think patient will do relatively well.

## 2017-02-19 MED FILL — AMLODIPINE BESYLATE 5 MG TA: 5 | 90 days supply | Qty: 90 | Fill #2

## 2017-04-07 ENCOUNTER — Ambulatory Visit: Payer: Medicare Other | Admitting: Family Medicine

## 2017-05-06 ENCOUNTER — Other Ambulatory Visit: Payer: Self-pay | Admitting: Emergency Medicine

## 2017-05-06 MED ORDER — RANITIDINE HCL 150 MG PO TABS: 150.0000 mg | ORAL_TABLET | Freq: Two times a day (BID) | ORAL | 0 refills | Status: DC

## 2017-05-14 ENCOUNTER — Other Ambulatory Visit: Payer: Self-pay | Admitting: Internal Medicine

## 2017-06-04 MED FILL — AMLODIPINE BESYLATE 5 MG TA: 5 | 90 days supply | Qty: 90 | Fill #3

## 2017-06-16 ENCOUNTER — Other Ambulatory Visit: Payer: Self-pay | Admitting: Internal Medicine

## 2017-06-16 DIAGNOSIS — Z1231 Encounter for screening mammogram for malignant neoplasm of breast: Secondary | ICD-10-CM

## 2017-06-20 ENCOUNTER — Ambulatory Visit (INDEPENDENT_AMBULATORY_CARE_PROVIDER_SITE_OTHER): Payer: Medicare Other | Admitting: Family

## 2017-06-20 ENCOUNTER — Other Ambulatory Visit: Payer: Medicare Other

## 2017-06-20 ENCOUNTER — Encounter: Payer: Self-pay | Admitting: Family

## 2017-06-20 DIAGNOSIS — J019 Acute sinusitis, unspecified: Secondary | ICD-10-CM

## 2017-06-20 DIAGNOSIS — R300 Dysuria: Secondary | ICD-10-CM

## 2017-06-20 LAB — POC URINALSYSI DIPSTICK (AUTOMATED)
BILIRUBIN UA: NEGATIVE
Glucose, UA: NEGATIVE
Ketones, UA: NEGATIVE
PH UA: 6 (ref 5.0–8.0)
Protein, UA: NEGATIVE
RBC UA: NEGATIVE
Spec Grav, UA: 1.015 (ref 1.010–1.025)
UROBILINOGEN UA: 0.2 U/dL

## 2017-06-20 MED ORDER — BENZONATATE 100 MG PO CAPS: 100.0000 mg | ORAL_CAPSULE | Freq: Three times a day (TID) | ORAL | 0 refills | Status: DC | PRN

## 2017-06-20 MED ORDER — AMOXICILLIN-POT CLAVULANATE 875-125 MG PO TABS: 1.0000 | ORAL_TABLET | Freq: Two times a day (BID) | ORAL | 0 refills | Status: DC

## 2017-06-20 NOTE — Progress Notes (Signed)
Sara [name] is a 72 y.o. female with the following history as recorded in EpicCare:  Patient Active Problem List   Diagnosis Date Noted  . Peroneal tendinitis of right lower extremity 12/09/2016  . Partial tear of right Achilles tendon 11/15/2016  . Achilles tendinitis of right lower extremity 10/30/2016  . SI joint arthritis ([place]) 06/17/2016  . Epigastric pain 05/19/2016  . Atypical chest pain 05/10/2016  . Renal insufficiency 12/16/2015  . Low back pain 10/06/2015  . UTI (urinary tract infection) 10/06/2015  . S/P laparoscopic cholecystectomy-for advanced necrotic gallbladder August 2014 11/05/2012  . DEPRESSION, SITUATIONAL 02/20/2009  . GERD 02/20/2009  . RENAL CALCULUS, HX OF 02/20/2009  . Essential hypertension 02/14/2009  . Diaphragmatic hernia 02/14/2009  . SYNCOPE 02/05/2009    Current Outpatient Medications  Medication Sig Dispense Refill  . amLODipine (NORVASC) 5 MG tablet Take 1 tablet (5 mg total) by mouth every morning. Yearly physical is due in April must see MD for refills 30 tablet 0  . Calcium Carbonate-Vitamin D (CALCIUM-VITAMIN D) 500-200 MG-UNIT per tablet Take 1 tablet by mouth 2 (two) times daily with a meal.    . Cholecalciferol 1000 UNITS tablet Take 1,000 Units by mouth daily.     . Cyanocobalamin (VITAMIN B-12 CR PO) Take by mouth daily.    . fluticasone (FLONASE) 50 MCG/ACT nasal spray Place 2 sprays into both nostrils daily. 16 g 6  . Multiple Vitamin (MULTIVITAMIN WITH MINERALS) TABS Take 1 tablet by mouth daily.    . pantoprazole (PROTONIX) 40 MG tablet Take 1 tablet (40 mg total) by mouth daily. --- Office visit needed for further refills 30 tablet 0  . Probiotic Product (PROBIOTIC DAILY PO) Take by mouth daily.    . ranitidine (ZANTAC) 150 MG tablet Take 1 tablet (150 mg total) by mouth 2 (two) times daily. -- Office visit needed for further refills 180 tablet 0  . amoxicillin-clavulanate (AUGMENTIN) 875-125 MG tablet Take 1 tablet by mouth 2  (two) times daily. 20 tablet 0  . benzonatate (TESSALON) 100 MG capsule Take 1 capsule (100 mg total) by mouth 3 (three) times daily as needed. 20 capsule 0   No current facility-administered medications for this visit.     Allergies: Patient has no known allergies.  Past Medical History:  Diagnosis Date  . Barrett's esophagus   . GERD (gastroesophageal reflux disease)    hx of   . HIATAL HERNIA    s/p surgical repair  . HYPERTENSION   . Kidney stone    1990s    Past Surgical History:  Procedure Laterality Date  . CATARACT EXTRACTION  2009   both eyes  . CHOLECYSTECTOMY  11/02/2012   Procedure: CHOLECYSTECTOMY;  Surgeon: Pedro Earls, MD;  Location: [facility];  Service: General;;  . Lester [place] REPAIR N/A 06/12/2012   Procedure: ANTERIOR REPAIR (CYSTOCELE);  Surgeon: Elveria Royals, MD;  Location: [facility];  Service: Gynecology;  Laterality: N/A;  . HIATAL HERNIA REPAIR  2011  . LITHOTRIPSY    . TONSILLECTOMY  1968  . UPPER GASTROINTESTINAL ENDOSCOPY    . UPPER GI ENDOSCOPY  11/02/2012   Procedure: UPPER GI ENDOSCOPY;  Surgeon: Pedro Earls, MD;  Location: [facility];  Service: General;;  . VAGINAL HYSTERECTOMY N/A 06/12/2012   Procedure: HYSTERECTOMY VAGINAL;  Surgeon: Elveria Royals, MD;  Location: [facility];  Service: Gynecology;  Laterality: N/A;    Family History  Problem Relation Age of Onset  . Cancer Mother   .  Hypertension Other        Parent  . Breast cancer Other   . Heart disease Other        parent, grandparent  . Colon cancer Paternal Aunt   . Breast cancer Maternal Grandmother   . Esophageal cancer Neg Hx   . Rectal cancer Neg Hx   . Stomach cancer Neg Hx     Social History   Tobacco Use  . Smoking status: Never Smoker  . Smokeless tobacco: Never Used  . Tobacco comment: widowed since late 2011 (spouse had mod-adv dementia) 5 grown children, 3 nearby-,many g-kids  Substance Use Topics  . Alcohol use: No    Subjective:  Persistent cough x 3 weeks; has  heard herself wheezing; denies any chest pain or shortness of breath; grandchildren had similar symptoms-feels this is where her symptoms coming from; denies any fever; had flare of reflux and seemed to aggravate symptoms. + cough problematic at night; + coughing fits;  Started with UTI symptoms x 1 day; + burning; + strong smelling urine;   Objective:  Vitals:   06/20/17 0819  BP: 130/80  Pulse: 91  Temp: 98.1 F (36.7 C)  TempSrc: Oral  SpO2: 98%  Weight: 177 lb (80.3 kg)  Height: 5\' 8"  (1.727 m)    General: Well developed, well nourished, in no acute distress  Skin : Warm and dry.  Head: Normocephalic and atraumatic  Eyes: Sclera and conjunctiva clear; pupils round and reactive to light; extraocular movements intact  Ears: External normal; canals clear; tympanic membranes normal  Oropharynx: Pink, supple. No suspicious lesions  Neck: Supple without thyromegaly, adenopathy  Lungs: Respirations unlabored; clear to auscultation bilaterally without wheeze, rales, rhonchi  CVS exam: normal rate and regular rhythm.  Neurologic: Alert and oriented; speech intact; face symmetrical; moves all extremities well; CNII-XII intact without focal deficit  Assessment:  1. Acute sinusitis, recurrence not specified, unspecified location   2. Dysuria     Plan:  Rx for Augmentin 875 mg bid x 10 days; sample of Asmanex 100 2 puffs bid; Rx for Tessalon Perles; increase fluids, rest; Check U/A and Urine culture today; follow-up to be determined.   No follow-ups on file.  Orders Placed This Encounter  Procedures  . Urine Culture    Standing Status:   Future    Standing Expiration Date:   06/20/2018    Requested Prescriptions   Signed Prescriptions Disp Refills  . amoxicillin-clavulanate (AUGMENTIN) 875-125 MG tablet 20 tablet 0    Sig: Take 1 tablet by mouth 2 (two) times daily.  . benzonatate (TESSALON) 100 MG capsule 20 capsule 0    Sig: Take 1 capsule (100 mg total) by mouth 3 (three)  times daily as needed.

## 2017-06-20 NOTE — Addendum Note (Signed)
Addended by: Marcina Millard on: 06/20/2017 08:59 AM   Modules accepted: Orders

## 2017-06-22 LAB — URINE CULTURE
MICRO NUMBER:: 90393905
SPECIMEN QUALITY: ADEQUATE

## 2017-07-03 ENCOUNTER — Ambulatory Visit: Payer: Medicare Other

## 2017-07-21 ENCOUNTER — Encounter: Payer: Self-pay | Admitting: Family

## 2017-07-21 ENCOUNTER — Ambulatory Visit (INDEPENDENT_AMBULATORY_CARE_PROVIDER_SITE_OTHER)
Admission: RE | Admit: 2017-07-21 | Discharge: 2017-07-21 | Disposition: A | Discharge status: Home / Self Care | Payer: Medicare Other | Source: Ambulatory Visit | Attending: Family | Admitting: Family

## 2017-07-21 ENCOUNTER — Ambulatory Visit (INDEPENDENT_AMBULATORY_CARE_PROVIDER_SITE_OTHER): Payer: Medicare Other | Admitting: Family

## 2017-07-21 VITALS — BP 138/84 | HR 68 | Temp 98.4°F | Ht 68.0 in | Wt 173.0 lb

## 2017-07-21 DIAGNOSIS — R053 Chronic cough: Secondary | ICD-10-CM

## 2017-07-21 DIAGNOSIS — J209 Acute bronchitis, unspecified: Secondary | ICD-10-CM

## 2017-07-21 DIAGNOSIS — R05 Cough: Secondary | ICD-10-CM | POA: Diagnosis not present

## 2017-07-21 MED ORDER — DOXYCYCLINE HYCLATE 100 MG PO TABS: 100.0000 mg | ORAL_TABLET | Freq: Two times a day (BID) | ORAL | 0 refills | Status: DC

## 2017-07-21 MED ORDER — PREDNISONE 20 MG PO TABS: 20.0000 mg | ORAL_TABLET | Freq: Every day | ORAL | 0 refills | Status: DC

## 2017-07-21 NOTE — Progress Notes (Signed)
Sara [name] is a 72 y.o. female with the following history as recorded in EpicCare:  Patient Active Problem List   Diagnosis Date Noted  . Peroneal tendinitis of right lower extremity 12/09/2016  . Partial tear of right Achilles tendon 11/15/2016  . Achilles tendinitis of right lower extremity 10/30/2016  . SI joint arthritis 06/17/2016  . Epigastric pain 05/19/2016  . Atypical chest pain 05/10/2016  . Renal insufficiency 12/16/2015  . Low back pain 10/06/2015  . UTI (urinary tract infection) 10/06/2015  . S/P laparoscopic cholecystectomy-for advanced necrotic gallbladder August 2014 11/05/2012  . DEPRESSION, SITUATIONAL 02/20/2009  . GERD 02/20/2009  . RENAL CALCULUS, HX OF 02/20/2009  . Essential hypertension 02/14/2009  . Diaphragmatic hernia 02/14/2009  . SYNCOPE 02/05/2009    Current Outpatient Medications  Medication Sig Dispense Refill  . amLODipine (NORVASC) 5 MG tablet Take 1 tablet (5 mg total) by mouth every morning. Yearly physical is due in April must see MD for refills 30 tablet 0  . benzonatate (TESSALON) 100 MG capsule Take 1 capsule (100 mg total) by mouth 3 (three) times daily as needed. 20 capsule 0  . Calcium Carbonate-Vitamin D (CALCIUM-VITAMIN D) 500-200 MG-UNIT per tablet Take 1 tablet by mouth 2 (two) times daily with a meal.    . Cholecalciferol 1000 UNITS tablet Take 1,000 Units by mouth daily.     . Cyanocobalamin (VITAMIN B-12 CR PO) Take by mouth daily.    . fluticasone (FLONASE) 50 MCG/ACT nasal spray Place 2 sprays into both nostrils daily. 16 g 6  . Multiple Vitamin (MULTIVITAMIN WITH MINERALS) TABS Take 1 tablet by mouth daily.    . pantoprazole (PROTONIX) 40 MG tablet Take 1 tablet (40 mg total) by mouth daily. --- Office visit needed for further refills 30 tablet 0  . Probiotic Product (PROBIOTIC DAILY PO) Take by mouth daily.    . ranitidine (ZANTAC) 150 MG tablet Take 1 tablet (150 mg total) by mouth 2 (two) times daily. -- Office visit  needed for further refills 180 tablet 0  . doxycycline (VIBRA-TABS) 100 MG tablet Take 1 tablet (100 mg total) by mouth 2 (two) times daily. 20 tablet 0  . predniSONE (DELTASONE) 20 MG tablet Take 1 tablet (20 mg total) by mouth daily with breakfast. 5 tablet 0   No current facility-administered medications for this visit.     Allergies: Patient has no known allergies.  Past Medical History:  Diagnosis Date  . Barrett's esophagus   . GERD (gastroesophageal reflux disease)    hx of   . HIATAL HERNIA    s/p surgical repair  . HYPERTENSION   . Kidney stone    1990s    Past Surgical History:  Procedure Laterality Date  . CATARACT EXTRACTION  2009   both eyes  . CHOLECYSTECTOMY  11/02/2012   Procedure: CHOLECYSTECTOMY;  Surgeon: Pedro Earls, MD;  Location: [facility];  Service: General;;  . Lester [place] REPAIR N/A 06/12/2012   Procedure: ANTERIOR REPAIR (CYSTOCELE);  Surgeon: Elveria Royals, MD;  Location: [facility];  Service: Gynecology;  Laterality: N/A;  . HIATAL HERNIA REPAIR  2011  . LITHOTRIPSY    . TONSILLECTOMY  1968  . UPPER GASTROINTESTINAL ENDOSCOPY    . UPPER GI ENDOSCOPY  11/02/2012   Procedure: UPPER GI ENDOSCOPY;  Surgeon: Pedro Earls, MD;  Location: [facility];  Service: General;;  . VAGINAL HYSTERECTOMY N/A 06/12/2012   Procedure: HYSTERECTOMY VAGINAL;  Surgeon: Elveria Royals, MD;  Location: [facility];  Service: Gynecology;  Laterality: N/A;    Family History  Problem Relation Age of Onset  . Cancer Mother   . Hypertension Other        Parent  . Breast cancer Other   . Heart disease Other        parent, grandparent  . Colon cancer Paternal Aunt   . Breast cancer Maternal Grandmother   . Esophageal cancer Neg Hx   . Rectal cancer Neg Hx   . Stomach cancer Neg Hx     Social History   Tobacco Use  . Smoking status: Never Smoker  . Smokeless tobacco: Never Used  . Tobacco comment: widowed since late 2011 (spouse had mod-adv dementia) 5 grown children, 3  nearby-,many g-kids  Substance Use Topics  . Alcohol use: No    Subjective:  Seen at end of March with suspected acute sinus infection and UTI; treated with 10 day course of Augmentin; responded initially; symptoms have returned in the past week; + hoarseness in the morning; + wheezing in the evening; + sore throat- feels like "something is in back of throat." Using Flonase, Mucinex, inhaler; history of bronchitis as a child; has been using the left over Asmanex given at her last office visit with some relief.   Objective:  Vitals:   07/21/17 1028  BP: 138/84  Pulse: 68  Temp: 98.4 F (36.9 C)  TempSrc: Oral  SpO2: 98%  Weight: 173 lb (78.5 kg)  Height: 5\' 8"  (1.727 m)    General: Well developed, well nourished, in no acute distress  Skin : Warm and dry.  Head: Normocephalic and atraumatic  Eyes: Sclera and conjunctiva clear; pupils round and reactive to light; extraocular movements intact  Ears: External normal; canals clear; tympanic membranes normal  Oropharynx: Pink, supple. No suspicious lesions; post-nasal drainage seen  Neck: Supple without thyromegaly, adenopathy  Lungs: Respirations unlabored; clear to auscultation bilaterally without wheeze, rales, rhonchi  CVS exam: normal rate and regular rhythm.  Neurologic: Alert and oriented; speech intact; face symmetrical; moves all extremities well; CNII-XII intact without focal deficit   Assessment:  1. Persistent cough for 3 weeks or longer   2. Acute bronchitis, unspecified organism     Plan:  Will update CXR due to recurrent symptoms; Rx for Doxycycline, Prednisone; follow-up to be determined. May need to consider treatment for underlying asthma/ COPD;   No follow-ups on file.  Orders Placed This Encounter  Procedures  . DG Chest 2 View    Standing Status:   Future    Number of Occurrences:   1    Standing Expiration Date:   09/21/2018    Order Specific Question:   Reason for Exam (SYMPTOM  OR DIAGNOSIS REQUIRED)     Answer:   cough x 3 weeks    Order Specific Question:   Preferred imaging location?    Answer:   [facility]    Order Specific Question:   Radiology Contrast Protocol - do NOT remove file path    Answer:   [email or web address]    Requested Prescriptions   Signed Prescriptions Disp Refills  . doxycycline (VIBRA-TABS) 100 MG tablet 20 tablet 0    Sig: Take 1 tablet (100 mg total) by mouth 2 (two) times daily.  . predniSONE (DELTASONE) 20 MG tablet 5 tablet 0    Sig: Take 1 tablet (20 mg total) by mouth daily with breakfast.

## 2017-07-22 ENCOUNTER — Encounter: Payer: Self-pay | Admitting: Family

## 2017-07-22 ENCOUNTER — Ambulatory Visit
Admission: RE | Admit: 2017-07-22 | Discharge: 2017-07-22 | Disposition: A | Discharge status: Home / Self Care | Payer: Medicare Other | Source: Ambulatory Visit | Attending: Internal Medicine | Admitting: Internal Medicine

## 2017-07-22 DIAGNOSIS — Z1231 Encounter for screening mammogram for malignant neoplasm of breast: Secondary | ICD-10-CM | POA: Diagnosis not present

## 2017-07-23 NOTE — Telephone Encounter (Signed)
Please advise. Thanks.  

## 2017-08-31 ENCOUNTER — Other Ambulatory Visit: Payer: Self-pay | Admitting: Internal Medicine

## 2017-10-05 NOTE — Progress Notes (Signed)
Subjective:    Patient ID: Sara [name], female    DOB: 03-08-1946, 72 y.o.   MRN: [id number]  HPI The patient is here for follow up.  Hypertension: She is taking her medication daily. She is compliant with a low sodium diet.  She denies chest pain, edema, shortness of breath and regular headaches. She is exercising regularly - walking.  She does not monitor her blood pressure at home.    GERD:  She is taking her pantoprazole daily as prescribed.  She denies any GERD symptoms and feels her GERD is well controlled.   Decreased kidney function:  Takes advil on occasion.  She is good with drinking water throughout the day.    Diarrhea: it started the end of May.  She has no control of it.  She gets a bubbling sensation in her abdomen and then sharp cramping and then diarrhea.   It is not daily.  She does have normal BM's in between diarrhea episodes.  She denies abdominal pain in between bowel movements.    She is having lower back pain.  She wonders if that is related to her bladder and possible urinary tract infection.  Her bladder is also dropped which could be causing some of the problems.  ? UTI:  She has cloudy urine.  No dysuria, no hematuria.  She has no increased urinary frequency.    Medications and allergies reviewed with patient and updated if appropriate.  Patient Active Problem List   Diagnosis Date Noted  . Aortic atherosclerosis ([place]) 10/07/2017  . Change in bowel habits 10/07/2017  . Cloudy urine 10/07/2017  . Peroneal tendinitis of right lower extremity 12/09/2016  . Partial tear of right Achilles tendon 11/15/2016  . Achilles tendinitis of right lower extremity 10/30/2016  . SI joint arthritis 06/17/2016  . Renal insufficiency 12/16/2015  . Low back pain 10/06/2015  . S/P laparoscopic cholecystectomy-for advanced necrotic gallbladder August 2014 11/05/2012  . GERD 02/20/2009  . RENAL CALCULUS, HX OF 02/20/2009  . Essential hypertension 02/14/2009  .  Diaphragmatic hernia 02/14/2009  . SYNCOPE 02/05/2009    Current Outpatient Medications on File Prior to Visit  Medication Sig Dispense Refill  . Calcium Carbonate-Vitamin D (CALCIUM-VITAMIN D) 500-200 MG-UNIT per tablet Take 1 tablet by mouth 2 (two) times daily with a meal.    . Cholecalciferol 1000 UNITS tablet Take 1,000 Units by mouth daily.     . Cyanocobalamin (VITAMIN B-12 CR PO) Take by mouth daily.    . fluticasone (FLONASE) 50 MCG/ACT nasal spray Place 2 sprays into both nostrils daily. 16 g 6  . Multiple Vitamin (MULTIVITAMIN WITH MINERALS) TABS Take 1 tablet by mouth daily.    . Probiotic Product (PROBIOTIC DAILY PO) Take by mouth daily.     No current facility-administered medications on file prior to visit.     Past Medical History:  Diagnosis Date  . Barrett's esophagus   . GERD (gastroesophageal reflux disease)    hx of   . HIATAL HERNIA    s/p surgical repair  . HYPERTENSION   . Kidney stone    1990s    Past Surgical History:  Procedure Laterality Date  . CATARACT EXTRACTION  2009   both eyes  . CHOLECYSTECTOMY  11/02/2012   Procedure: CHOLECYSTECTOMY;  Surgeon: Pedro Earls, MD;  Location: [facility];  Service: General;;  . [place]ester [place] REPAIR N/A 06/12/2012   Procedure: ANTERIOR REPAIR (CYSTOCELE);  Surgeon: Elveria Royals, MD;  Location: [facility];  Service: Gynecology;  Laterality: N/A;  . HIATAL HERNIA REPAIR  2011  . LITHOTRIPSY    . TONSILLECTOMY  1968  . UPPER GASTROINTESTINAL ENDOSCOPY    . UPPER GI ENDOSCOPY  11/02/2012   Procedure: UPPER GI ENDOSCOPY;  Surgeon: Pedro Earls, MD;  Location: [facility];  Service: General;;  . VAGINAL HYSTERECTOMY N/A 06/12/2012   Procedure: HYSTERECTOMY VAGINAL;  Surgeon: Elveria Royals, MD;  Location: [facility];  Service: Gynecology;  Laterality: N/A;    Social History   Socioeconomic History  . Marital status: Widowed    Spouse name: Not on file  . Number of children: Not on file  . Years of education: 29  .  Highest education level: Not on file  Occupational History  . Occupation: Retired  Scientific laboratory technician  . Financial resource strain: Not on file  . Food insecurity:    Worry: Not on file    Inability: Not on file  . Transportation needs:    Medical: Not on file    Non-medical: Not on file  Tobacco Use  . Smoking status: Never Smoker  . Smokeless tobacco: Never Used  . Tobacco comment: widowed since late 2011 (spouse had mod-adv dementia) 5 grown children, 3 nearby-,many g-kids  Substance and Sexual Activity  . Alcohol use: No  . Drug use: No  . Sexual activity: Yes    Birth control/protection: Post-menopausal  Lifestyle  . Physical activity:    Days per week: Not on file    Minutes per session: Not on file  . Stress: Not on file  Relationships  . Social connections:    Talks on phone: Not on file    Gets together: Not on file    Attends religious service: Not on file    Active member of club or organization: Not on file    Attends meetings of clubs or organizations: Not on file    Relationship status: Not on file  Other Topics Concern  . Not on file  Social History Narrative  . Not on file    Family History  Problem Relation Age of Onset  . Cancer Mother   . Hypertension Other        Parent  . Breast cancer Other   . Heart disease Other        parent, grandparent  . Colon cancer Paternal Aunt   . Breast cancer Maternal Grandmother   . Esophageal cancer Neg Hx   . Rectal cancer Neg Hx   . Stomach cancer Neg Hx     Review of Systems  Constitutional: Positive for diaphoresis (gets sweaty early). Negative for chills and fever.  Respiratory: Negative for cough, shortness of breath and wheezing.   Cardiovascular: Positive for palpitations (occ). Negative for chest pain and leg swelling.  Gastrointestinal: Positive for abdominal pain (comes with diarrhea only) and diarrhea. Negative for blood in stool (no black stools), nausea and vomiting.       Occ gerd  Genitourinary:  Negative for dysuria, frequency and hematuria.       Cloudy urine  Neurological: Positive for light-headedness (occ). Negative for headaches.       Objective:   Vitals:   10/07/17 0741  BP: 134/72  Pulse: 78  Resp: 16  Temp: 97.9 F (36.6 C)  SpO2: 98%   BP Readings from Last 3 Encounters:  10/07/17 134/72  07/21/17 138/84  06/20/17 130/80   Wt Readings from Last 3 Encounters:  10/07/17 168 lb (76.2 kg)  07/21/17  173 lb (78.5 kg)  06/20/17 177 lb (80.3 kg)   Body mass index is 25.54 kg/m.   Physical Exam    Constitutional: Appears well-developed and well-nourished. No distress.  HENT:  Head: Normocephalic and atraumatic.  Neck: Neck supple. No tracheal deviation present. No thyromegaly present.  No cervical lymphadenopathy Cardiovascular: Normal rate, regular rhythm and normal heart sounds.   No murmur heard. No carotid bruit .  No edema Pulmonary/Chest: Effort normal and breath sounds normal. No respiratory distress. No has no wheezes. No rales.  Abdomen:  Minimal pressure in LLQ but no tenderness, no tenderness elsewhere, no distention, no mass/lumps, no HSM GU: no CVA tenderness Skin: Skin is warm and dry. Not diaphoretic.  Psychiatric: Normal mood and affect. Behavior is normal.      Assessment & Plan:    See Problem List for Assessment and Plan of chronic medical problems.

## 2017-10-07 ENCOUNTER — Ambulatory Visit (INDEPENDENT_AMBULATORY_CARE_PROVIDER_SITE_OTHER): Payer: Medicare Other | Admitting: Internal Medicine

## 2017-10-07 ENCOUNTER — Encounter: Payer: Self-pay | Admitting: Internal Medicine

## 2017-10-07 ENCOUNTER — Other Ambulatory Visit (INDEPENDENT_AMBULATORY_CARE_PROVIDER_SITE_OTHER): Payer: Medicare Other

## 2017-10-07 ENCOUNTER — Other Ambulatory Visit: Payer: Self-pay | Admitting: Internal Medicine

## 2017-10-07 VITALS — BP 134/72 | HR 78 | Temp 97.9°F | Resp 16 | Wt 168.0 lb

## 2017-10-07 DIAGNOSIS — Z1159 Encounter for screening for other viral diseases: Secondary | ICD-10-CM | POA: Diagnosis not present

## 2017-10-07 DIAGNOSIS — I700 Atherosclerosis of aorta: Secondary | ICD-10-CM | POA: Diagnosis not present

## 2017-10-07 DIAGNOSIS — N289 Disorder of kidney and ureter, unspecified: Secondary | ICD-10-CM

## 2017-10-07 DIAGNOSIS — I10 Essential (primary) hypertension: Secondary | ICD-10-CM

## 2017-10-07 DIAGNOSIS — K219 Gastro-esophageal reflux disease without esophagitis: Secondary | ICD-10-CM | POA: Diagnosis not present

## 2017-10-07 DIAGNOSIS — R194 Change in bowel habit: Secondary | ICD-10-CM

## 2017-10-07 DIAGNOSIS — R8290 Unspecified abnormal findings in urine: Secondary | ICD-10-CM

## 2017-10-07 LAB — URINALYSIS, ROUTINE W REFLEX MICROSCOPIC
Bilirubin Urine: NEGATIVE
Ketones, ur: NEGATIVE
Nitrite: POSITIVE — AB
TOTAL PROTEIN, URINE-UPE24: NEGATIVE
URINE GLUCOSE: NEGATIVE
UROBILINOGEN UA: 0.2 (ref 0.0–1.0)
pH: 6 (ref 5.0–8.0)

## 2017-10-07 LAB — CBC WITH DIFFERENTIAL/PLATELET
Basophils Absolute: 0 10*3/uL (ref 0.0–0.1)
Basophils Relative: 0.4 % (ref 0.0–3.0)
EOS PCT: 5 % (ref 0.0–5.0)
Eosinophils Absolute: 0.3 10*3/uL (ref 0.0–0.7)
HCT: 45.4 % (ref 36.0–46.0)
Hemoglobin: 15.4 g/dL — ABNORMAL HIGH (ref 12.0–15.0)
LYMPHS ABS: 1.7 10*3/uL (ref 0.7–4.0)
Lymphocytes Relative: 26.2 % (ref 12.0–46.0)
MCHC: 33.9 g/dL (ref 30.0–36.0)
MCV: 91.3 fl (ref 78.0–100.0)
MONOS PCT: 9.8 % (ref 3.0–12.0)
Monocytes Absolute: 0.6 10*3/uL (ref 0.1–1.0)
NEUTROS ABS: 3.8 10*3/uL (ref 1.4–7.7)
NEUTROS PCT: 58.6 % (ref 43.0–77.0)
Platelets: 285 10*3/uL (ref 150.0–400.0)
RBC: 4.97 Mil/uL (ref 3.87–5.11)
RDW: 13.8 % (ref 11.5–15.5)
WBC: 6.5 10*3/uL (ref 4.0–10.5)

## 2017-10-07 LAB — COMPREHENSIVE METABOLIC PANEL
ALK PHOS: 85 U/L (ref 39–117)
ALT: 10 U/L (ref 0–35)
AST: 16 U/L (ref 0–37)
Albumin: 4.2 g/dL (ref 3.5–5.2)
BUN: 21 mg/dL (ref 6–23)
CHLORIDE: 103 meq/L (ref 96–112)
CO2: 27 meq/L (ref 19–32)
Calcium: 10.1 mg/dL (ref 8.4–10.5)
Creatinine, Ser: 1.23 mg/dL — ABNORMAL HIGH (ref 0.40–1.20)
GFR: 45.85 mL/min — AB (ref >60.00)
GLUCOSE: 111 mg/dL — AB (ref 70–99)
Potassium: 4.6 mEq/L (ref 3.5–5.1)
Sodium: 139 mEq/L (ref 135–145)
Total Bilirubin: 0.7 mg/dL (ref 0.2–1.2)
Total Protein: 7.2 g/dL (ref 6.0–8.3)

## 2017-10-07 LAB — TSH: TSH: 2.28 u[IU]/mL (ref 0.35–4.50)

## 2017-10-07 LAB — LIPID PANEL
CHOL/HDL RATIO: 3
Cholesterol: 178 mg/dL (ref 0–200)
HDL: 55 mg/dL (ref >39.00)
LDL CALC: 100 mg/dL — AB (ref 0–99)
NONHDL: 123.07
Triglycerides: 114 mg/dL (ref 0.0–149.0)
VLDL: 22.8 mg/dL (ref 0.0–40.0)

## 2017-10-07 MED ORDER — PANTOPRAZOLE SODIUM 40 MG PO TBEC: 40.0000 mg | DELAYED_RELEASE_TABLET | Freq: Every day | ORAL | 3 refills | Status: DC

## 2017-10-07 MED ORDER — AMLODIPINE BESYLATE 5 MG PO TABS: 5.0000 mg | ORAL_TABLET | Freq: Every morning | ORAL | 3 refills | Status: DC

## 2017-10-07 MED ORDER — AMOXICILLIN-POT CLAVULANATE 875-125 MG PO TABS: 1.0000 | ORAL_TABLET | Freq: Two times a day (BID) | ORAL | 0 refills | Status: DC

## 2017-10-07 NOTE — Assessment & Plan Note (Signed)
GERD controlled Continue daily medication  

## 2017-10-07 NOTE — Assessment & Plan Note (Signed)
Blood pressure well controlled here today Continue current medication CMP

## 2017-10-07 NOTE — Assessment & Plan Note (Signed)
Aortic atherosclerosis seen on imaging-no quantity given Will check lipid panel-can consider low-dose statin, but in the past cholesterol was fairly good and she may only have a mild amount of atherosclerosis Continue healthy lifestyle

## 2017-10-07 NOTE — Assessment & Plan Note (Signed)
History of renal insufficiency CMP today along with CBC May need further work-up if kidney function is still decreased Advised avoiding NSAIDs Continue drinking plenty of fluids

## 2017-10-07 NOTE — Assessment & Plan Note (Signed)
She is having intermittent diarrhea-not daily and sometimes only once a month that is associated with abdominal cramping No blood, weight loss or persistent abdominal pain She is overdue for colonoscopy and advised that she should have one regardless, but with the symptoms she definitely needs to have one We will check basic blood work including TSH, CMP and CBC Unlikely infectious or inflammatory given that it is not consistent Advised trying Metamucil and adjusting diet She will schedule for colonoscopy

## 2017-10-07 NOTE — Patient Instructions (Addendum)
  Test(s) ordered today. Your results will be released to [facility] (or called to you) after review, usually within 72hours after test completion. If any changes need to be made, you will be notified at that same time.  All other Health Maintenance issues reviewed.   All recommended immunizations and age-appropriate screenings are up-to-date or discussed.  No immunizations administered today.   Medications reviewed and updated.  No changes recommended at this time.  Your prescription(s) have been submitted to your pharmacy. Please take as directed and contact our office if you believe you are having problem(s) with the medication(s).   Please followup in one year  Schedule your colonoscopy, try the metamucil.

## 2017-10-07 NOTE — Assessment & Plan Note (Signed)
Urinalysis, urine culture to rule out an infection

## 2017-10-09 ENCOUNTER — Encounter: Payer: Self-pay | Admitting: Internal Medicine

## 2017-10-09 LAB — HEPATITIS C ANTIBODY
Hepatitis C Ab: NONREACTIVE
SIGNAL TO CUT-OFF: 0.01 (ref <1.00)

## 2017-10-09 LAB — URINE CULTURE
MICRO NUMBER: 90840614
SPECIMEN QUALITY:: ADEQUATE

## 2017-10-09 NOTE — Telephone Encounter (Signed)
Message has been completed in previous MyChart message

## 2017-10-10 ENCOUNTER — Telehealth: Payer: Self-pay

## 2017-10-10 ENCOUNTER — Other Ambulatory Visit: Payer: Self-pay | Admitting: Internal Medicine

## 2017-10-10 NOTE — Telephone Encounter (Signed)
-----   Message from Binnie Rail, MD sent at 10/10/2017  8:10 AM EDT ----- UTI not sensitive to augmentin - I just took it off her med list - have her stop it.  Start cipro 250 mg twice daily x 5 days.

## 2017-10-10 NOTE — Telephone Encounter (Signed)
Called pt, LVM   Also, noticed she has viewed culture results via MyChart.

## 2017-10-31 ENCOUNTER — Telehealth: Payer: Self-pay | Admitting: Internal Medicine

## 2017-10-31 NOTE — Telephone Encounter (Signed)
Pt would like to transfer from [facility] to [facility]. Please advise

## 2017-11-02 NOTE — Telephone Encounter (Signed)
Ok with me 

## 2017-11-03 NOTE — Telephone Encounter (Signed)
Okay with me 

## 2017-11-03 NOTE — Telephone Encounter (Signed)
Ok to schedule with [employer]

## 2017-11-03 NOTE — Telephone Encounter (Signed)
Contact patient to schedule TOC appointment. °

## 2017-11-04 NOTE — Telephone Encounter (Signed)
LVM to schedule

## 2017-11-27 ENCOUNTER — Ambulatory Visit (INDEPENDENT_AMBULATORY_CARE_PROVIDER_SITE_OTHER): Payer: Medicare Other | Admitting: Family Medicine

## 2017-11-27 ENCOUNTER — Encounter: Payer: Self-pay | Admitting: Family Medicine

## 2017-11-27 VITALS — BP 134/90 | HR 67 | Temp 98.0°F | Ht 68.0 in | Wt 167.2 lb

## 2017-11-27 DIAGNOSIS — R197 Diarrhea, unspecified: Secondary | ICD-10-CM | POA: Diagnosis not present

## 2017-11-27 DIAGNOSIS — Z1211 Encounter for screening for malignant neoplasm of colon: Secondary | ICD-10-CM | POA: Diagnosis not present

## 2017-11-27 NOTE — Progress Notes (Signed)
Patient: Sara [name] MRN: [id number] DOB: Jul 29, 1945 PCP: Binnie Rail, MD     Subjective:  Chief Complaint  Patient presents with  . transfer of care    HPI: The patient is a 71 y.o. female who presents today for transfer of care from Dr. Quay Burow.  Problems with diverticulitis. She also has hx of HTN, CKD that was improved on last lab check. She has complaints today of diarrhea. She has always had issues with constipation until May of this year. She was put on a course of amoxicillin and she started to have diarrhea about one month after she completed her course. She had constant diarrhea since that time and has about 3-4 episodes/day. No blood that she has seen. She has uncomfortable feeling that goes away after she has a BM. At times, she has to go so bad that it leaks into her underwear. She has not traveled outside of the country and has city water. Drinks bottled water. She has no animals. No fever/chills. Has lost 10 pounds since end of last year. Unintentional. She has not had a colonoscopy. No FH of colon cancer.   Review of Systems  Constitutional: Negative for fatigue.  Respiratory: Negative for shortness of breath.   Cardiovascular: Negative for chest pain.  Gastrointestinal: Positive for diarrhea. Negative for abdominal pain, blood in stool and nausea.  Neurological: Negative for dizziness and headaches.  Psychiatric/Behavioral: Negative for sleep disturbance. The patient is not nervous/anxious.     Allergies Patient has No Known Allergies.  Past Medical History Patient  has a past medical history of Barrett's esophagus, GERD (gastroesophageal reflux disease), HIATAL HERNIA, HYPERTENSION, and Kidney stone.  Surgical History Patient  has a past surgical history that includes Tonsillectomy (1968); Cataract extraction (2009); Lithotripsy; Vaginal hysterectomy (N/A, 06/12/2012); Cystocele repair (N/A, 06/12/2012); Hiatal hernia repair (2011); Cholecystectomy (11/02/2012);  Upper gi endoscopy (11/02/2012); and Upper gastrointestinal endoscopy.  Family History Pateint's family history includes Breast cancer in her maternal grandmother and other; Cancer in her mother; Colon cancer in her paternal aunt; Heart disease in her other; Hypertension in her other.  Social History Patient  reports that she has never smoked. She has never used smokeless tobacco. She reports that she does not drink alcohol or use drugs.    Objective: Vitals:   11/27/17 1008  BP: 134/90  Pulse: 67  Temp: 98 F (36.7 C)  TempSrc: Oral  SpO2: 99%  Weight: 167 lb 3.2 oz (75.8 kg)  Height: 5\' 8"  (1.727 m)    Body mass index is 25.42 kg/m.  Physical Exam  Constitutional: She is oriented to person, place, and time. She appears well-developed and well-nourished.  Neck: Normal range of motion. Neck supple.  Cardiovascular: Normal rate, regular rhythm and normal heart sounds.  No carotid bruits.   Pulmonary/Chest: Effort normal and breath sounds normal.  Abdominal: Soft. Bowel sounds are normal. She exhibits no distension. There is no tenderness.  Lymphadenopathy:    She has no cervical adenopathy.  Neurological: She is alert and oriented to person, place, and time.  Vitals reviewed.      Assessment/plan: 1. Diarrhea, unspecified type New onset after abx. Checking stool studies/cdiff first and needs cscope. All ordered. Rule out these but did discuss IBS in differential.  - Clostridium difficile Toxin B, Qualitative, Real-Time PCR - Fecal lactoferrin, quant; Future - Ova and parasite examination; Future - Stool culture; Future - Ambulatory referral to Gastroenterology  2. Screening for colon cancer With sudden change in BM, needs  cscope.  - Ambulatory referral to Gastroenterology     Return in about 3 months (around 02/26/2018) for routine htn f/u. labs. Orma Flaming, MD [place]   11/27/2017

## 2017-11-27 NOTE — Patient Instructions (Signed)
Health Maintenance Due  Topic Date Due  . COLONOSCOPY -will refer patient for appt 07/27/1997  . INFLUENZA VACCINE -please call our office to schedule this in October/November 10/23/2017

## 2017-11-28 ENCOUNTER — Encounter: Payer: Self-pay | Admitting: Gastroenterology

## 2017-11-28 ENCOUNTER — Telehealth: Payer: Self-pay | Admitting: Gastroenterology

## 2017-11-28 NOTE — Telephone Encounter (Signed)
Error

## 2017-12-01 ENCOUNTER — Other Ambulatory Visit: Payer: Medicare Other

## 2017-12-01 DIAGNOSIS — R197 Diarrhea, unspecified: Secondary | ICD-10-CM | POA: Diagnosis not present

## 2017-12-05 LAB — STOOL CULTURE
MICRO NUMBER: 91075418
MICRO NUMBER: 91075419
MICRO NUMBER:: 91075422
SHIGA RESULT:: NOT DETECTED
SPECIMEN QUALITY: ADEQUATE
SPECIMEN QUALITY:: ADEQUATE
SPECIMEN QUALITY:: ADEQUATE

## 2017-12-05 LAB — FECAL LACTOFERRIN, QUANT
Fecal Lactoferrin: NEGATIVE
MICRO NUMBER:: 91075420
SPECIMEN QUALITY:: ADEQUATE

## 2017-12-05 LAB — OVA AND PARASITE EXAMINATION
CONCENTRATE RESULT: NONE SEEN
MICRO NUMBER:: 91075421
SPECIMEN QUALITY:: ADEQUATE
TRICHROME RESULT: NONE SEEN

## 2017-12-05 LAB — CLOSTRIDIUM DIFFICILE TOXIN B, QUALITATIVE, REAL-TIME PCR: Toxigenic C. Difficile by PCR: NOT DETECTED

## 2017-12-09 ENCOUNTER — Telehealth: Payer: Self-pay | Admitting: *Deleted

## 2017-12-09 NOTE — Telephone Encounter (Signed)
Called patient to see if she would like to come in tomorrow instead of Thursday. She states she cannot because she will be caring for a grandchild but she did appreciate the call. She states she does not believe her last infection ever cleared up. I explained to call our office tomorrow if anything changes and we can move her appointment up. Patient stated understanding.

## 2017-12-11 ENCOUNTER — Encounter: Payer: Self-pay | Admitting: Family Medicine

## 2017-12-11 ENCOUNTER — Ambulatory Visit (INDEPENDENT_AMBULATORY_CARE_PROVIDER_SITE_OTHER): Payer: Medicare Other

## 2017-12-11 ENCOUNTER — Ambulatory Visit (INDEPENDENT_AMBULATORY_CARE_PROVIDER_SITE_OTHER): Payer: Medicare Other | Admitting: Family Medicine

## 2017-12-11 VITALS — BP 118/80 | HR 78 | Temp 98.0°F | Ht 68.0 in | Wt 172.8 lb

## 2017-12-11 DIAGNOSIS — M545 Low back pain, unspecified: Secondary | ICD-10-CM

## 2017-12-11 DIAGNOSIS — R300 Dysuria: Secondary | ICD-10-CM | POA: Diagnosis not present

## 2017-12-11 DIAGNOSIS — M5136 Other intervertebral disc degeneration, lumbar region: Secondary | ICD-10-CM | POA: Diagnosis not present

## 2017-12-11 DIAGNOSIS — M533 Sacrococcygeal disorders, not elsewhere classified: Secondary | ICD-10-CM | POA: Diagnosis not present

## 2017-12-11 LAB — POCT URINALYSIS DIPSTICK
BILIRUBIN UA: NEGATIVE
GLUCOSE UA: NEGATIVE
KETONES UA: NEGATIVE
Nitrite, UA: NEGATIVE
Protein, UA: NEGATIVE
SPEC GRAV UA: 1.01 (ref 1.010–1.025)
Urobilinogen, UA: 0.2 E.U./dL
pH, UA: 6.5 (ref 5.0–8.0)

## 2017-12-11 MED ORDER — CIPROFLOXACIN HCL 250 MG PO TABS: 250.0000 mg | ORAL_TABLET | Freq: Two times a day (BID) | ORAL | 0 refills | Status: DC

## 2017-12-11 NOTE — Progress Notes (Signed)
Patient: Sara [name] MRN: [id number] DOB: 1946/03/10 PCP: Orma Flaming, MD     Subjective:  Chief Complaint  Patient presents with  . poss uti    HPI: The patient is a 72 y.o. female who presents today for poss UTI. She started to have symptoms a week or two ago. She has been having a lot of diarrhea and is wondering if this is contributing. She states she has pressure in her bladder and pain. She does have some dysuria as well, but not when urinating. She states it feels like a normal urine infection to her. No CVA tenderness. NO fever/chills. Her urine is cloudy with some mild odor. No visible blood. She also has increased frequency. Not really urgency.   She also has had some lower back pain and hip pain over the last 2-3 months. It comes and goes and is worse at night after she has been up a lot during the day. Sometimes it bothers her after she has been sitting a long time. Pain rated as a 7/10 and described as something catching/sharp pain. After she gets up and moves around, it gets better. She cant walk for long periods of time. Ibuprofen does help. Heating pad also helps her some. She had one time where it went down her left leg, but normally does not radiate. Pain more her lower back/si joints and not really on her hips. No radicular symptoms, weakness, gait abnormalities or loss of urine.   Review of Systems  Constitutional: Negative for chills and fever.  Genitourinary: Positive for frequency. Negative for difficulty urinating, dysuria, hematuria, pelvic pain and urgency.       C/o pelvic pressure Urine is cloudy  Musculoskeletal: Positive for back pain and joint swelling (hip pain, >r. ). Negative for gait problem and myalgias.       Lower back pain  Neurological: Negative for syncope, weakness and numbness.    Allergies Patient has No Known Allergies.   Past Medical History Patient  has a past medical history of Barrett's esophagus, GERD (gastroesophageal reflux  disease), HIATAL HERNIA, HYPERTENSION, and Kidney stone.  Surgical History Patient  has a past surgical history that includes Tonsillectomy (1968); Cataract extraction (2009); Lithotripsy; Vaginal hysterectomy (N/A, 06/12/2012); Cystocele repair (N/A, 06/12/2012); Hiatal hernia repair (2011); Cholecystectomy (11/02/2012); Upper gi endoscopy (11/02/2012); and Upper gastrointestinal endoscopy.  Family History Pateint's family history includes Breast cancer in her maternal grandmother and other; Cancer in her mother; Colon cancer in her paternal aunt; Heart disease in her other; Hypertension in her other.  Social History Patient  reports that she has never smoked. She has never used smokeless tobacco. She reports that she does not drink alcohol or use drugs.    Objective: Vitals:   12/11/17 0826  BP: 118/80  Pulse: 78  Temp: 98 F (36.7 C)  TempSrc: Oral  SpO2: 98%  Weight: 172 lb 12.8 oz (78.4 kg)  Height: 5\' 8"  (1.727 m)    Body mass index is 26.27 kg/m.  Physical Exam  Constitutional: She is oriented to person, place, and time. She appears well-developed and well-nourished.  Cardiovascular: Normal rate, regular rhythm and normal heart sounds.  Pulmonary/Chest: Effort normal and breath sounds normal.  Abdominal: Soft. Bowel sounds are normal. There is tenderness (suprapubic ).  Musculoskeletal: Normal range of motion.  Negative straight leg test bilaterally.  Sensation intact Strength intact TTP over SI joint on right as well as top of pelvis  No hip pain   Neurological: She is alert  and oriented to person, place, and time. She displays normal reflexes.  Vitals reviewed.     lumbar xray: scoliosis, DJD. Official read pending  Si xray: no acute finding. Official read pending   Assessment/plan: 1. Burning with urination UA not very significant, but feels like typical UTI. She also had a UTI a few months ago that was never treated. Will put her on cipro since it was sensitive  to this, renally dosed, and follow up on culture.  - POCT urinalysis dipstick; Future - Urine Culture; Future - Urine Culture - POCT urinalysis dipstick  2. Acute low back pain without sciatica, unspecified back pain laterality She has some scoliosis and pretty significant DJD. Official read is pending. Discussed with her I will get the results back form this  First, but may do trial of PT. NO NSAIDS with GFR decreased. Extra strength tylenol at this point. She may be a good candidate for injections, but would prefer conservative therapy at this point.  - DG Lumbar Spine 2-3 Views; Future - DG Si Joints; Future   Return if symptoms worsen or fail to improve.   Orma Flaming, MD [place]   12/11/2017

## 2017-12-12 ENCOUNTER — Telehealth: Payer: Self-pay

## 2017-12-12 NOTE — Telephone Encounter (Signed)
Spoke with patient.  Still thinking over options in regard to injections vs. PT for her back.  She is inquiring about the procedure for the injections in her back.  Discussed the process and that it is done by a pain management physician.    She states that she will contact me back early next week with a decision.

## 2017-12-12 NOTE — Telephone Encounter (Signed)
Copied from [place] [id number]. Topic: General - Other >> Dec 12, 2017 10:23 AM Alfredia Ferguson R wrote: Pt is calling in to speak with Anderson Malta regarding her results that were given to her on yesterday  CB# [id number]

## 2017-12-13 LAB — URINE CULTURE
MICRO NUMBER: 91126378
SPECIMEN QUALITY: ADEQUATE

## 2017-12-15 ENCOUNTER — Encounter: Payer: Self-pay | Admitting: Family Medicine

## 2017-12-26 ENCOUNTER — Telehealth: Payer: Self-pay | Admitting: Gastroenterology

## 2017-12-26 ENCOUNTER — Ambulatory Visit: Payer: Medicare Other | Admitting: Gastroenterology

## 2017-12-26 ENCOUNTER — Encounter: Payer: Self-pay | Admitting: Gastroenterology

## 2017-12-26 VITALS — BP 114/72 | HR 68 | Ht 68.0 in | Wt 167.2 lb

## 2017-12-26 DIAGNOSIS — Z1211 Encounter for screening for malignant neoplasm of colon: Secondary | ICD-10-CM | POA: Diagnosis not present

## 2017-12-26 DIAGNOSIS — R197 Diarrhea, unspecified: Secondary | ICD-10-CM | POA: Diagnosis not present

## 2017-12-26 MED ORDER — PEG 3350-KCL-NA BICARB-NACL 420 G PO SOLR: 4000.0000 mL | ORAL | 0 refills | Status: DC

## 2017-12-26 NOTE — Telephone Encounter (Signed)
Prep was sent to pharmacy patient notified.

## 2017-12-26 NOTE — Patient Instructions (Addendum)
You will be set up for a colonoscopy for colon cancer screening.  You have been scheduled for a colonoscopy. Please follow written instructions given to you at your visit today.  Please pick up your prep supplies at the pharmacy within the next 1-3 days. If you use inhalers (even only as needed), please bring them with you on the day of your procedure. Your physician has requested that you go to www.startemmi.com and enter the access code given to you at your visit today. This web site gives a general overview about your procedure. However, you should still follow specific instructions given to you by our office regarding your preparation for the procedure.  Thank you for entrusting me with your care and choosing [facility].  Dr Jacobs  

## 2017-12-26 NOTE — Progress Notes (Signed)
HPI: This is a very pleasant 72 year old woman who was referred to me by Orma Flaming, MD  to evaluate intermittent loose stools.    Chief complaint is intermittent loose stools   URI, amox started, repeated.  Starting in May 2019 bowel changed.  Rumbling in abd and profuse diarrhea.  Has recurred several times since then.  The episodes last usually 1 day and during that day she has several watery stools.  Some mild cramping in her abdomen.  She never has bloody stools  Another course of abx about 2 months again for UTI  Between the episodes her bowels are fine.  BM daily.  Previously was constipated.   Overall stable weight.  She has never had a colonoscopy before.  She is not sure if she has never had colon cancer screening of any kind  Aunt with colon cancer   Old Data Reviewed: Stool test September 2019 were all negative for ova parasites, routine stool culture, fecal lactoferrin and C. Difficile by PCR.    Review of systems: Pertinent positive and negative review of systems were noted in the above HPI section. All other review negative.   Past Medical History:  Diagnosis Date  . Barrett's esophagus   . GERD (gastroesophageal reflux disease)    hx of   . HIATAL HERNIA    s/p surgical repair  . HYPERTENSION   . Kidney stone    1990s    Past Surgical History:  Procedure Laterality Date  . CATARACT EXTRACTION  2009   both eyes  . CHOLECYSTECTOMY  11/02/2012   Procedure: CHOLECYSTECTOMY;  Surgeon: Pedro Earls, MD;  Location: [facility];  Service: General;;  . Lester [place] REPAIR N/A 06/12/2012   Procedure: ANTERIOR REPAIR (CYSTOCELE);  Surgeon: Elveria Royals, MD;  Location: [facility];  Service: Gynecology;  Laterality: N/A;  . HIATAL HERNIA REPAIR  2011  . LITHOTRIPSY    . TONSILLECTOMY  1968  . UPPER GASTROINTESTINAL ENDOSCOPY    . UPPER GI ENDOSCOPY  11/02/2012   Procedure: UPPER GI ENDOSCOPY;  Surgeon: Pedro Earls, MD;  Location: [facility];  Service:  General;;  . VAGINAL HYSTERECTOMY N/A 06/12/2012   Procedure: HYSTERECTOMY VAGINAL;  Surgeon: Elveria Royals, MD;  Location: [facility];  Service: Gynecology;  Laterality: N/A;    Current Outpatient Medications  Medication Sig Dispense Refill  . amLODipine (NORVASC) 5 MG tablet Take 1 tablet (5 mg total) by mouth every morning. 90 tablet 3  . Calcium Carbonate-Vitamin D (CALCIUM-VITAMIN D) 500-200 MG-UNIT per tablet Take 1 tablet by mouth 2 (two) times daily with a meal.    . Cholecalciferol 1000 UNITS tablet Take 1,000 Units by mouth daily.     . Cyanocobalamin (VITAMIN B-12 CR PO) Take by mouth daily.    . Multiple Vitamin (MULTIVITAMIN WITH MINERALS) TABS Take 1 tablet by mouth daily.    . pantoprazole (PROTONIX) 40 MG tablet Take 1 tablet (40 mg total) by mouth daily. 90 tablet 3  . Probiotic Product (PROBIOTIC DAILY PO) Take by mouth daily.     No current facility-administered medications for this visit.     Allergies as of 12/26/2017  . (No Known Allergies)    Family History  Problem Relation Age of Onset  . Cancer Mother   . Hypertension Other        Parent  . Breast cancer Other   . Heart disease Other        parent, grandparent  . Colon cancer Paternal  Aunt   . Breast cancer Maternal Grandmother   . Esophageal cancer Neg Hx   . Rectal cancer Neg Hx   . Stomach cancer Neg Hx     Social History   Socioeconomic History  . Marital status: Widowed    Spouse name: Not on file  . Number of children: 5  . Years of education: 60  . Highest education level: Not on file  Occupational History  . Occupation: Retired  Scientific laboratory technician  . Financial resource strain: Not on file  . Food insecurity:    Worry: Not on file    Inability: Not on file  . Transportation needs:    Medical: Not on file    Non-medical: Not on file  Tobacco Use  . Smoking status: Never Smoker  . Smokeless tobacco: Never Used  . Tobacco comment: widowed since late 2011 (spouse had mod-adv dementia) 5  grown children, 3 nearby-,many g-kids  Substance and Sexual Activity  . Alcohol use: No  . Drug use: No  . Sexual activity: Yes    Birth control/protection: Post-menopausal  Lifestyle  . Physical activity:    Days per week: Not on file    Minutes per session: Not on file  . Stress: Not on file  Relationships  . Social connections:    Talks on phone: Not on file    Gets together: Not on file    Attends religious service: Not on file    Active member of club or organization: Not on file    Attends meetings of clubs or organizations: Not on file    Relationship status: Not on file  . Intimate partner violence:    Fear of current or ex partner: Not on file    Emotionally abused: Not on file    Physically abused: Not on file    Forced sexual activity: Not on file  Other Topics Concern  . Not on file  Social History Narrative  . Not on file     Physical Exam: BP 114/72   Pulse 68   Ht 5\' 8"  (1.727 m)   Wt 167 lb 4 oz (75.9 kg)   LMP  (LMP Unknown)   BMI 25.43 kg/m  Constitutional: generally well-appearing Psychiatric: alert and oriented x3 Eyes: extraocular movements intact Mouth: oral pharynx moist, no lesions Neck: supple no lymphadenopathy Cardiovascular: heart regular rate and rhythm Lungs: clear to auscultation bilaterally Abdomen: soft, nontender, nondistended, no obvious ascites, no peritoneal signs, normal bowel sounds Extremities: no lower extremity edema bilaterally Skin: no lesions on visible extremities   Assessment and plan: 72 y.o. female with intermittent loose stools  Stool testing last month was negative for ova parasites, routine culture and C. difficile.  Her symptoms do correlate with taking a lot of antibiotics recently and I suspect this is related to the antibiotics somehow.  Perhaps she had some type of an infection and is now dealing with postinfectious IBS type symptoms.  I think it is unlikely that she has a neoplastic process.  For the most  part her bowels are normal fortunately.  These episodes are only about every 2 to 3 weeks.  I recommended a colonoscopy for colon cancer screening and no other testing for now.    Please see the "Patient Instructions" section for addition details about the plan.   Owens Loffler, MD [facility] 12/26/2017, 10:25 AM  Cc: Orma Flaming, MD

## 2017-12-26 NOTE — Telephone Encounter (Signed)
Patient states prep is not at pharmacy and would like it resent. Pt seen today 10.4.19. Patient states to put it under Sara [name].

## 2018-01-29 ENCOUNTER — Ambulatory Visit (INDEPENDENT_AMBULATORY_CARE_PROVIDER_SITE_OTHER): Payer: Medicare Other

## 2018-01-29 ENCOUNTER — Telehealth: Payer: Self-pay | Admitting: Family Medicine

## 2018-01-29 ENCOUNTER — Encounter: Payer: Self-pay | Admitting: Family Medicine

## 2018-01-29 ENCOUNTER — Ambulatory Visit (INDEPENDENT_AMBULATORY_CARE_PROVIDER_SITE_OTHER): Payer: Medicare Other | Admitting: Family Medicine

## 2018-01-29 ENCOUNTER — Ambulatory Visit: Payer: Medicare Other

## 2018-01-29 VITALS — BP 150/90 | HR 74 | Temp 97.8°F | Ht 68.0 in | Wt 167.8 lb

## 2018-01-29 DIAGNOSIS — M25561 Pain in right knee: Secondary | ICD-10-CM | POA: Diagnosis not present

## 2018-01-29 DIAGNOSIS — M79671 Pain in right foot: Secondary | ICD-10-CM | POA: Diagnosis not present

## 2018-01-29 DIAGNOSIS — M7989 Other specified soft tissue disorders: Secondary | ICD-10-CM | POA: Diagnosis not present

## 2018-01-29 DIAGNOSIS — M25571 Pain in right ankle and joints of right foot: Secondary | ICD-10-CM

## 2018-01-29 MED ORDER — DICLOFENAC SODIUM 1 % TD GEL: 4.0000 g | Freq: Four times a day (QID) | TRANSDERMAL | 1 refills | Status: DC

## 2018-01-29 MED ORDER — PREDNISONE 20 MG PO TABS: ORAL_TABLET | ORAL | 0 refills | Status: DC

## 2018-01-29 NOTE — Progress Notes (Signed)
Patient: Sara [name] MRN: [id number] DOB: November 01, 1945 PCP: Orma Flaming, MD     Subjective:  Chief Complaint  Patient presents with  . right foot swelling    HPI: The patient is a 72 y.o. female who presents today for right foot swelling. Pain and swelling in lateral ankle joint. She denies any trauma to the foot. She states her pain and swelling started about 2 days. She noticed her foot felt hot and when she took her shoe off it was swollen. She propped it up. Yesterday she noticed it was swollen around her ankle joint. No pain to touch, but hurts just inferior to the ankle. She feels more pain with extension and flexion of the foot in this area. She has not taken any medication over the counter. Pain is bad with a lot of walking and if she puts varus strain on foot. No hx of gout, does have hx of HTN.   Review of Systems  Constitutional: Negative for chills and fever.  Respiratory: Negative for cough and shortness of breath.   Cardiovascular: Negative for chest pain and leg swelling.  Musculoskeletal: Positive for arthralgias and joint swelling.       Right foot pain/swelling  No known injury    Allergies Patient is allergic to augmentin [amoxicillin-pot clavulanate].  Past Medical History Patient  has a past medical history of Barrett's esophagus, GERD (gastroesophageal reflux disease), HIATAL HERNIA, HYPERTENSION, and Kidney stone.  Surgical History Patient  has a past surgical history that includes Tonsillectomy (1968); Cataract extraction (2009); Lithotripsy; Vaginal hysterectomy (N/A, 06/12/2012); Cystocele repair (N/A, 06/12/2012); Hiatal hernia repair (2011); Cholecystectomy (11/02/2012); Upper gi endoscopy (11/02/2012); and Upper gastrointestinal endoscopy.  Family History Pateint's family history includes Breast cancer in her maternal grandmother and other; Cancer in her mother; Colon cancer in her paternal aunt; Heart disease in her other; Hypertension in her  other.  Social History Patient  reports that she has never smoked. She has never used smokeless tobacco. She reports that she does not drink alcohol or use drugs.    Objective: Vitals:   01/29/18 1120  BP: (!) 150/90  Pulse: 74  Temp: 97.8 F (36.6 C)  TempSrc: Oral  SpO2: 99%  Weight: 167 lb 12.8 oz (76.1 kg)  Height: 5\' 8"  (1.727 m)  HC: 99" (251.5 cm)    Body mass index is 25.51 kg/m.  Physical Exam  Constitutional: She appears well-developed and well-nourished.  Musculoskeletal: She exhibits edema and tenderness.  Right ankle: +edema to lateral malleolus. No warmth or erythema. No point tenderness on malleolus. She has more pain with palpation just inferior to the malleolus. She has no pain with dorsiflexion or flexion of foot. Pain with valgus strain. Pulses intact. Gait normal.   Skin: Skin is warm. No erythema.  Vitals reviewed.     ankle xray: no acute fx. Official read pending.   Assessment/plan: 1. Acute right ankle pain Talofibular ligament strain vs. Gout. Want her to get lace up brace, voltaren gel QID and she can start steroids if no improvement in a few days since she can not take NSAIDS due to CKD. If not getting better let me know.   2. Acute foot pain, right  - DG Foot Complete Right; Future - DG Ankle 2 Views Right; Future    Return if symptoms worsen or fail to improve.   Orma Flaming, MD [place]   01/29/2018

## 2018-01-29 NOTE — Telephone Encounter (Signed)
She needs to take 2 pills/day for 5 days.

## 2018-01-29 NOTE — Patient Instructions (Signed)
-  voltaren gel to the ankle four times a day. It's an NSAID gel since you can't take oral nsaids.   -prednisone to start if you can't see any improvement. Will take 2 pills daily x 5 days. This will treat gout as well.   I either think you have gout or have strained your ligament. Ankle brace (lace up), ice it and medication per above.

## 2018-01-29 NOTE — Telephone Encounter (Signed)
Copied from [place] [id number]. Topic: Quick Communication - See Telephone Encounter >> Jan 29, 2018  2:37 PM Blase Mess A wrote: CRM for notification. See Telephone encounter for: 01/29/18.  Patient is calling regarding predniSONE (DELTASONE) 20 MG tablet [[id number]] the instructions on her medicine are different than her after visit summary.  Patient is requesting advice.

## 2018-01-29 NOTE — Telephone Encounter (Signed)
How did you want her to take?

## 2018-01-30 NOTE — Telephone Encounter (Signed)
Called patient gave information had repeated back to me. Will call with any questions.

## 2018-02-04 ENCOUNTER — Encounter: Payer: Self-pay | Admitting: Family Medicine

## 2018-02-05 ENCOUNTER — Other Ambulatory Visit: Payer: Self-pay | Admitting: Family Medicine

## 2018-02-05 DIAGNOSIS — M25571 Pain in right ankle and joints of right foot: Secondary | ICD-10-CM

## 2018-02-06 ENCOUNTER — Ambulatory Visit (INDEPENDENT_AMBULATORY_CARE_PROVIDER_SITE_OTHER): Payer: Medicare Other

## 2018-02-06 ENCOUNTER — Other Ambulatory Visit: Payer: Self-pay | Admitting: Family Medicine

## 2018-02-06 DIAGNOSIS — M25571 Pain in right ankle and joints of right foot: Secondary | ICD-10-CM | POA: Diagnosis not present

## 2018-02-06 DIAGNOSIS — M7989 Other specified soft tissue disorders: Secondary | ICD-10-CM | POA: Diagnosis not present

## 2018-02-06 DIAGNOSIS — S8264XD Nondisplaced fracture of lateral malleolus of right fibula, subsequent encounter for closed fracture with routine healing: Secondary | ICD-10-CM

## 2018-02-09 ENCOUNTER — Ambulatory Visit: Payer: Medicare Other | Admitting: Sports Medicine

## 2018-02-09 ENCOUNTER — Encounter: Payer: Self-pay | Admitting: Sports Medicine

## 2018-02-09 ENCOUNTER — Ambulatory Visit: Payer: Self-pay

## 2018-02-09 ENCOUNTER — Telehealth: Payer: Self-pay | Admitting: Gastroenterology

## 2018-02-09 VITALS — BP 132/82 | HR 72 | Ht 68.0 in | Wt 170.0 lb

## 2018-02-09 DIAGNOSIS — M79671 Pain in right foot: Secondary | ICD-10-CM

## 2018-02-09 DIAGNOSIS — M7671 Peroneal tendinitis, right leg: Secondary | ICD-10-CM | POA: Diagnosis not present

## 2018-02-09 DIAGNOSIS — M25571 Pain in right ankle and joints of right foot: Secondary | ICD-10-CM | POA: Diagnosis not present

## 2018-02-09 MED ORDER — NITROGLYCERIN 0.2 MG/HR TD PT24: MEDICATED_PATCH | TRANSDERMAL | 1 refills | Status: DC

## 2018-02-09 NOTE — Procedures (Signed)
LIMITED MSK ULTRASOUND OF Right ankle Images were obtained and interpreted by myself, Teresa Coombs, DO  Images have been saved and stored to PACS system. Images obtained on: GE S7 Ultrasound machine  FINDINGS:   Marked swelling and Tendinopathic change with interstitial splitting and thickening at the level of the lateral malleolus of the peroneal tendons.  There is marked increased interstitial fluid.  No overt tearing.  Small amount of overlying subcutaneous edema of the anterior lateral ankle joint without significant ankle effusion.  IMPRESSION:  1. Peroneal tendinopathy with underlying mild ankle arthritis

## 2018-02-09 NOTE — Assessment & Plan Note (Signed)
Mild underlying ankle arthritis that may have been acutely exacerbated but does cause a fairly marked tenosynovitis of the peroneal tendons.  We discussed multiple options including injection therapy versus restarting nitroglycerin therapy which is what she had previously done for the Achilles tendon issue.  Therapeutic exercises  Will need to be initiated at follow-up visit in 2 weeks but at this time given the amount of swelling I like for her to acutely improve.  Discussed that the anticipated amount of time for healing is 12- 24 weeks for Tendinopathic changes.  Emphasized the importance of improving blood flow as well as eccentric loading of the tendon.  We will plan to check in in 6 weeks and repeat ultrasound at that time.

## 2018-02-09 NOTE — Telephone Encounter (Signed)
Spoke with patient. She states the ortho. Dr today put her on Nitroglycerin patch daily for her ankle swelling/inflammation. I asked Osvaldo Angst, CRNA if that med was ok for [place]. John Nulty,CRNA states that the patient can [place] this medication but not the day of the colonoscopy. Pt notified of his request for her NOT to use the Nitro. Patch the day of colonoscopy.

## 2018-02-09 NOTE — Progress Notes (Signed)
Sara [name]. Sara [name], [place] [facility]  Sara [name] - 72 y.o. female MRN [id number]  Date of birth: [birth year]  Visit Date: 02/09/2018  PCP: Orma Flaming, MD   Referred by: Orma Flaming, MD   Scribe(s) for today's visit: Wendy Poet, LAT, ATC  SUBJECTIVE:  Sara [name] is here for New Patient (Initial Visit) (R ankle fx)  Referred by: Dr. Rogers Blocker  HPI: Her R ankle symptoms INITIALLY: Began about 2 weeks ago with no MOI.  She saw Dr. Rogers Blocker on 01/29/18 and had a R ankle and foot XR.  She was prescribed Voltaren gel and given prednisone 20 mg.  She was also told to wear an ankle brace on her R ankle and was diagnosed w/ an ankle sprain.  Radiologist report indicated bony involvement and she had a repeat R ankle XR on 02/06/18. Described as mild sharp, quick pain, nonradiating Worsened with walking and prolonged weightbearing; R ankle DF and inversion Improved with nothing specific Additional associated symptoms include: R ankle swelling, R ankle mechanical symptoms but this has been present since prior R Achille's symptoms    At this time symptoms are improving compared to onset  She has been wearing a short ankle boot and using Voltaren gel.  She has finished her prednisone.  She has put a heel lift in her L shoe to help balance out her gait due to the ankle boot on the R.  R foot and ankle XR - 01/29/18; R ankle XR - 02/06/18  REVIEW OF SYSTEMS: Denies night time disturbances. Denies fevers, chills, or night sweats. Denies unexplained weight loss. Denies personal history of cancer. Denies changes in bowel or bladder habits. Denies recent unreported falls. Denies new or worsening dyspnea or wheezing. Denies headaches or dizziness.  Denies numbness, tingling or weakness  In the extremities.  Denies dizziness or presyncopal episodes Reports lower extremity edema - R lateral ankle swelling   HISTORY:    Prior history reviewed and updated per electronic medical record.  Social History   Occupational History  . Occupation: Retired  Tobacco Use  . Smoking status: Never Smoker  . Smokeless tobacco: Never Used  . Tobacco comment: widowed since late 2011 (spouse had mod-adv dementia) 5 grown children, 3 nearby-,many g-kids  Substance and Sexual Activity  . Alcohol use: No  . Drug use: No  . Sexual activity: Yes    Birth control/protection: Post-menopausal   Social History   Social History Narrative  . Not on file    Past Medical History:  Diagnosis Date  . Barrett's esophagus   . GERD (gastroesophageal reflux disease)    hx of   . HIATAL HERNIA    s/p surgical repair  . HYPERTENSION   . Kidney stone    1990s   Past Surgical History:  Procedure Laterality Date  . CATARACT EXTRACTION  2009   both eyes  . CHOLECYSTECTOMY  11/02/2012   Procedure: CHOLECYSTECTOMY;  Surgeon: Pedro Earls, MD;  Location: [facility];  Service: General;;  . [place] REPAIR N/A 06/12/2012   Procedure: ANTERIOR REPAIR (CYSTOCELE);  Surgeon: Elveria Royals, MD;  Location: [facility];  Service: Gynecology;  Laterality: N/A;  . HIATAL HERNIA REPAIR  2011  . LITHOTRIPSY    . TONSILLECTOMY  1968  . UPPER GASTROINTESTINAL ENDOSCOPY    . UPPER GI ENDOSCOPY  11/02/2012   Procedure: UPPER GI ENDOSCOPY;  Surgeon: Pedro Earls, MD;  Location: [facility];  Service: General;;  . VAGINAL HYSTERECTOMY N/A 06/12/2012   Procedure: HYSTERECTOMY VAGINAL;  Surgeon: Elveria Royals, MD;  Location: [facility];  Service: Gynecology;  Laterality: N/A;   family history includes Breast cancer in her maternal grandmother and other; Cancer in her mother; Colon cancer in her paternal aunt; Heart disease in her other; Hypertension in her other. There is no history of Esophageal cancer, Rectal cancer, or Stomach cancer.  DATA OBTAINED & REVIEWED:  No results for input(s): HGBA1C, LABURIC, CREATINE in the last 8760 hours. Problem   Peroneal Tendinitis of Lower Leg, Right   . X-rays of the foot and ankle reveal questionable linear lucency is likely reflective more of the growth plate than true fracture.  Mild inferior calcific change within the ankle joint likely reflective of underlying mild osteoarthritic change. .   OBJECTIVE:  VS:  HT:5\' 8"  (172.7 cm)   WT:170 lb (77.1 kg)  BMI:25.85    BP:132/82  HR:72bpm  TEMP: ( )  RESP:99 %   PHYSICAL EXAM: CONSTITUTIONAL: Well-developed, Well-nourished and In no acute distress PSYCHIATRIC: Alert & appropriately interactive. and Not depressed or anxious appearing. RESPIRATORY: No increased work of breathing and Trachea Midline EYES: Pupils are equal., EOM intact without nystagmus. and No scleral icterus.  VASCULAR EXAM: Warm and well perfused NEURO: unremarkable  MSK Exam: Right foot  Well aligned, no significant deformity. No overlying skin changes. TTP over Peroneal tendons mainly at the inferior aspect of the lateral malleolus.  Only minimal pain over the anterior lateral ankle and anterior syndesmosis.   RANGE OF MOTION & STRENGTH  Normal pain-free ankle dorsiflexion and plantarflexion although slightly uncomfortable to the lateral malleolus with terminal plantarflexion.   SPECIALITY TESTING:  Ankle drawer testing is stable.  No pain with talar tilting or midfoot abduction.  Moderate TTP over the peroneal tendons with generalized bogginess.    ASSESSMENT   1. Peroneal tendinitis of lower leg, right   2. Acute right ankle pain   3. Acute foot pain, right     PLAN:  Pertinent additional documentation may be included in corresponding procedure notes, imaging studies, problem based documentation and patient instructions.  Procedures:  . None  Medications:  Meds ordered this encounter  Medications  . nitroGLYCERIN (NITRODUR - DOSED IN MG/24 HR) 0.2 mg/hr patch    Sig: Place 1/4 to 1/2 of a patch over affected region. Remove and replace once daily.   Slightly alter skin placement daily    Dispense:  30 patch    Refill:  1    For musculoskeletal purposes.  Okay to cut patch.   Discussion/Instructions: Peroneal tendinitis of lower leg, right Mild underlying ankle arthritis that may have been acutely exacerbated but does cause a fairly marked tenosynovitis of the peroneal tendons.  We discussed multiple options including injection therapy versus restarting nitroglycerin therapy which is what she had previously done for the Achilles tendon issue.  Therapeutic exercises  Will need to be initiated at follow-up visit in 2 weeks but at this time given the amount of swelling I like for her to acutely improve.  Discussed that the anticipated amount of time for healing is 12- 24 weeks for Tendinopathic changes.  Emphasized the importance of improving blood flow as well as eccentric loading of the tendon.  We will plan to check in in 6 weeks and repeat ultrasound at that time.   . Discussed options with the patient today including biologic treatment with topical nitroglycerin. Patient has no contraindications &  understands the risks, benefits and intentions of treatment. Emphasized the importance of rotating sites as well as appropriate and expected adverse reactions including orthostasis, headache, adhesive sensitivity. Begin with 1/4 patch to the affected area. Okay to titrate to half a patch as tolerated. . Discussed red flag symptoms that warrant earlier emergent evaluation and patient voices understanding. . Activity modifications and the importance of avoiding exacerbating activities (limiting pain to no more than a 4 / 10 during or following activity) recommended and discussed.  Follow-up:  . Return in about 2 weeks (around 02/23/2018) for Initiating home therapeutic. .  . If any lack of improvement: consider further diagnostic evaluation with MRI of the ankle . At follow up will plan: To consider tendon sheath injection if any persistent or  worsening pain.  We will plan to have her begin a home therapeutic exercise program at follow-up.     CMA/ATC served as Education administrator during this visit. History, Physical, and Plan performed by medical provider. Documentation and orders reviewed and attested to.      Gerda Diss, [facility] Physician

## 2018-02-09 NOTE — Patient Instructions (Addendum)
Nitroglycerin Protocol   Apply 1/4 nitroglycerin patch to affected area daily.  Change position of patch within the affected area every 24 hours.  You may experience a headache during the first 1-2 weeks of using the patch, these should subside.  If you experience headaches after beginning nitroglycerin patch treatment, you may take your preferred over the counter pain reliever.  Another side effect of the nitroglycerin patch is skin irritation or rash related to patch adhesive.  Please notify our office if you develop more severe headaches or rash, and stop the patch.  Tendon healing with nitroglycerin patch may require 12 to 24 weeks depending on the extent of injury.  Men should not use if taking Viagra, Cialis, or Levitra.   Do not use if you have migraines or rosacea.   I recommend you obtained a compression sleeve to help with your joint problems. There are many options on the market however I recommend obtaining a Full Ankle Body Helix compression sleeve.  You can find information (including how to appropriate measure yourself for sizing) can be found at www.Body http://www.lambert.com/.  Many of these products are health savings account (HSA) eligible.   You can use the compression sleeve at any time throughout the day but is most important to use while being active as well as for 2 hours post-activity.   It is appropriate to ice following activity with the compression sleeve in place.

## 2018-02-17 ENCOUNTER — Ambulatory Visit (AMBULATORY_SURGERY_CENTER): Payer: Medicare Other | Admitting: Gastroenterology

## 2018-02-17 ENCOUNTER — Encounter: Payer: Self-pay | Admitting: Gastroenterology

## 2018-02-17 VITALS — BP 112/64 | HR 64 | Temp 96.2°F | Resp 11 | Ht 68.0 in | Wt 170.0 lb

## 2018-02-17 DIAGNOSIS — K5730 Diverticulosis of large intestine without perforation or abscess without bleeding: Secondary | ICD-10-CM

## 2018-02-17 DIAGNOSIS — Z1211 Encounter for screening for malignant neoplasm of colon: Secondary | ICD-10-CM | POA: Diagnosis not present

## 2018-02-17 DIAGNOSIS — D124 Benign neoplasm of descending colon: Secondary | ICD-10-CM | POA: Diagnosis not present

## 2018-02-17 DIAGNOSIS — D120 Benign neoplasm of cecum: Secondary | ICD-10-CM | POA: Diagnosis not present

## 2018-02-17 DIAGNOSIS — K2270 Barrett's esophagus without dysplasia: Secondary | ICD-10-CM | POA: Diagnosis not present

## 2018-02-17 MED ORDER — SODIUM CHLORIDE 0.9 % IV SOLN: 500.0000 mL | Freq: Once | INTRAVENOUS | Status: DC

## 2018-02-17 NOTE — Progress Notes (Signed)
Called to room to assist during endoscopic procedure.  Patient ID and intended procedure confirmed with present staff. Received instructions for my participation in the procedure from the performing physician.  

## 2018-02-17 NOTE — Progress Notes (Signed)
Report to PACU, RN, vss, BBS= Clear.  

## 2018-02-17 NOTE — Patient Instructions (Signed)
   INFORMATION ON POLYPS AND DIVERTICULOSIS GIVEN TO YOU TODAY  AWAIT PATHOLOGY RESULTS ON POLYPS REMOVED    YOU HAD AN ENDOSCOPIC PROCEDURE TODAY AT [facility]:   Refer to the procedure report that was given to you for any specific questions about what was found during the examination.  If the procedure report does not answer your questions, please call your gastroenterologist to clarify.  If you requested that your care partner not be given the details of your procedure findings, then the procedure report has been included in a sealed envelope for you to review at your convenience later.  YOU SHOULD EXPECT: Some feelings of bloating in the abdomen. Passage of more gas than usual.  Walking can help get rid of the air that was put into your GI tract during the procedure and reduce the bloating. If you had a lower endoscopy (such as a colonoscopy or flexible sigmoidoscopy) you may notice spotting of blood in your stool or on the toilet paper. If you underwent a bowel prep for your procedure, you may not have a normal bowel movement for a few days.  Please Note:  You might notice some irritation and congestion in your nose or some drainage.  This is from the oxygen used during your procedure.  There is no need for concern and it should clear up in a day or so.  SYMPTOMS TO REPORT IMMEDIATELY:   Following lower endoscopy (colonoscopy or flexible sigmoidoscopy):  Excessive amounts of blood in the stool  Significant tenderness or worsening of abdominal pains  Swelling of the abdomen that is new, acute  Fever of 100F or higher   For urgent or emergent issues, a gastroenterologist can be reached at any hour by calling [phone].   DIET:  We do recommend a small meal at first, but then you may proceed to your regular diet.  Drink plenty of fluids but you should avoid alcoholic beverages for 24 hours.  ACTIVITY:  You should plan to take it easy for the rest of today and you  should NOT DRIVE or use heavy machinery until tomorrow (because of the sedation medicines used during the test).    FOLLOW UP: Our staff will call the number listed on your records the next business day following your procedure to check on you and address any questions or concerns that you may have regarding the information given to you following your procedure. If we do not reach you, we will leave a message.  However, if you are feeling well and you are not experiencing any problems, there is no need to return our call.  We will assume that you have returned to your regular daily activities without incident.  If any biopsies were taken you will be contacted by phone or by letter within the next 1-3 weeks.  Please call us at [phone] if you have not heard about the biopsies in 3 weeks.    SIGNATURES/CONFIDENTIALITY: You and/or your care partner have signed paperwork which will be entered into your electronic medical record.  These signatures attest to the fact that that the information above on your After Visit Summary has been reviewed and is understood.  Full responsibility of the confidentiality of this discharge information lies with you and/or your care-partner.

## 2018-02-17 NOTE — Op Note (Signed)
[facility] Patient Name: Sara [name] Procedure Date: 02/17/2018 10:33 AM MRN: [id number] Endoscopist: Milus Banister , MD Age: 72 Referring MD:  Date of Birth: March 17, 1946 Gender: Female Account #: [id number] Procedure:                Colonoscopy Indications:              Screening for colorectal malignant neoplasm Medicines:                Monitored Anesthesia Care Procedure:                Pre-Anesthesia Assessment:                           - Prior to the procedure, a History and Physical                            was performed, and patient medications and                            allergies were reviewed. The patient's tolerance of                            previous anesthesia was also reviewed. The risks                            and benefits of the procedure and the sedation                            options and risks were discussed with the patient.                            All questions were answered, and informed consent                            was obtained. Prior Anticoagulants: The patient has                            taken no previous anticoagulant or antiplatelet                            agents. ASA Grade Assessment: II - A patient with                            mild systemic disease. After reviewing the risks                            and benefits, the patient was deemed in                            satisfactory condition to undergo the procedure.                           After obtaining informed consent, the colonoscope  was passed under direct vision. Throughout the                            procedure, the patient's blood pressure, pulse, and                            oxygen saturations were monitored continuously. The                            Colonoscope was introduced through the anus and                            advanced to the the cecum, identified by                            appendiceal orifice and  ileocecal valve. The                            colonoscopy was performed without difficulty. The                            patient tolerated the procedure well. The quality                            of the bowel preparation was good. The ileocecal                            valve, appendiceal orifice, and rectum were                            photographed. Scope In: 10:50:10 AM Scope Out: 11:07:56 AM Scope Withdrawal Time: 0 hours 12 minutes 25 seconds  Total Procedure Duration: 0 hours 17 minutes 46 seconds  Findings:                 A 15 mm polyp was found in the cecum. The polyp was                            sessile. The polyp was removed with a saline                            injection-lift technique using a hot snare.                            Resection and retrieval were complete.                           A 3 mm polyp was found in the descending colon. The                            polyp was sessile. The polyp was removed with a                            cold snare. Resection and  retrieval were complete.                           Multiple small and large-mouthed diverticula were                            found in the left colon.                           The exam was otherwise without abnormality on                            direct and retroflexion views. Complications:            No immediate complications. Estimated blood loss:                            None. Estimated Blood Loss:     Estimated blood loss: none. Impression:               - One 15 mm polyp in the cecum, removed using                            injection-lift and a hot snare. Resected and                            retrieved.                           - One 3 mm polyp in the descending colon, removed                            with a cold snare. Resected and retrieved.                           - Diverticulosis in the left colon.                           - The examination was otherwise normal on  direct                            and retroflexion views. Recommendation:           - Patient has a contact number available for                            emergencies. The signs and symptoms of potential                            delayed complications were discussed with the                            patient. Return to normal activities tomorrow.                            Written discharge instructions were provided to the  patient.                           - Resume previous diet.                           - Continue present medications.                           You will receive a letter within 2-3 weeks with the                            pathology results and my final recommendations.                           If the polyp(s) is proven to be 'pre-cancerous' on                            pathology, you will need repeat colonoscopy in 3                            years. If the polyp(s) is NOT 'precancerous' on                            pathology then you should repeat colon cancer                            screening in 10 years with colonoscopy without need                            for colon cancer screening by any method prior to                            then (including stool testing). Milus Banister, MD 02/17/2018 11:17:11 AM This report has been signed electronically.

## 2018-02-18 ENCOUNTER — Telehealth: Payer: Self-pay | Admitting: *Deleted

## 2018-02-18 NOTE — Telephone Encounter (Signed)
  Follow up Call-  Call back number 02/17/2018  Post procedure Call Back phone  # [phone]  Permission to leave phone message Yes  Some recent data might be hidden     Patient questions:  Do you have a fever, pain , or abdominal swelling? No. Pain Score  0 *  Have you tolerated food without any problems? Yes.    Have you been able to return to your normal activities? Yes.    Do you have any questions about your discharge instructions: Diet   No. Medications  No. Follow up visit  No.  Do you have questions or concerns about your Care? No.  Actions: * If pain score is 4 or above: No action needed, pain <4.

## 2018-02-23 ENCOUNTER — Encounter: Payer: Self-pay | Admitting: Gastroenterology

## 2018-02-26 ENCOUNTER — Encounter: Payer: Self-pay | Admitting: Family Medicine

## 2018-02-26 ENCOUNTER — Ambulatory Visit (INDEPENDENT_AMBULATORY_CARE_PROVIDER_SITE_OTHER): Payer: Medicare Other | Admitting: Family Medicine

## 2018-02-26 ENCOUNTER — Ambulatory Visit: Payer: Medicare Other | Admitting: Sports Medicine

## 2018-02-26 ENCOUNTER — Encounter: Payer: Self-pay | Admitting: Sports Medicine

## 2018-02-26 VITALS — BP 128/72 | HR 64 | Ht 68.0 in | Wt 163.2 lb

## 2018-02-26 VITALS — BP 128/72 | HR 64 | Temp 97.7°F | Ht 68.0 in | Wt 163.2 lb

## 2018-02-26 DIAGNOSIS — M7671 Peroneal tendinitis, right leg: Secondary | ICD-10-CM | POA: Diagnosis not present

## 2018-02-26 DIAGNOSIS — I10 Essential (primary) hypertension: Secondary | ICD-10-CM | POA: Diagnosis not present

## 2018-02-26 DIAGNOSIS — M25571 Pain in right ankle and joints of right foot: Secondary | ICD-10-CM | POA: Diagnosis not present

## 2018-02-26 DIAGNOSIS — Z23 Encounter for immunization: Secondary | ICD-10-CM | POA: Diagnosis not present

## 2018-02-26 LAB — COMPREHENSIVE METABOLIC PANEL
ALT: 13 U/L (ref 0–35)
AST: 15 U/L (ref 0–37)
Albumin: 4.2 g/dL (ref 3.5–5.2)
Alkaline Phosphatase: 82 U/L (ref 39–117)
BUN: 17 mg/dL (ref 6–23)
CHLORIDE: 103 meq/L (ref 96–112)
CO2: 28 meq/L (ref 19–32)
Calcium: 9.9 mg/dL (ref 8.4–10.5)
Creatinine, Ser: 1.1 mg/dL (ref 0.40–1.20)
GFR: 52.1 mL/min — AB (ref >60.00)
GLUCOSE: 86 mg/dL (ref 70–99)
POTASSIUM: 4.5 meq/L (ref 3.5–5.1)
Sodium: 139 mEq/L (ref 135–145)
Total Bilirubin: 0.6 mg/dL (ref 0.2–1.2)
Total Protein: 6.7 g/dL (ref 6.0–8.3)

## 2018-02-26 LAB — CBC WITH DIFFERENTIAL/PLATELET
BASOS PCT: 0.5 % (ref 0.0–3.0)
Basophils Absolute: 0 10*3/uL (ref 0.0–0.1)
EOS PCT: 5.2 % — AB (ref 0.0–5.0)
Eosinophils Absolute: 0.3 10*3/uL (ref 0.0–0.7)
HEMATOCRIT: 43.9 % (ref 36.0–46.0)
Hemoglobin: 14.8 g/dL (ref 12.0–15.0)
LYMPHS ABS: 1.8 10*3/uL (ref 0.7–4.0)
Lymphocytes Relative: 31.6 % (ref 12.0–46.0)
MCHC: 33.7 g/dL (ref 30.0–36.0)
MCV: 92.5 fl (ref 78.0–100.0)
MONOS PCT: 10 % (ref 3.0–12.0)
Monocytes Absolute: 0.6 10*3/uL (ref 0.1–1.0)
NEUTROS ABS: 3.1 10*3/uL (ref 1.4–7.7)
NEUTROS PCT: 52.7 % (ref 43.0–77.0)
PLATELETS: 314 10*3/uL (ref 150.0–400.0)
RBC: 4.75 Mil/uL (ref 3.87–5.11)
RDW: 13.6 % (ref 11.5–15.5)
WBC: 5.8 10*3/uL (ref 4.0–10.5)

## 2018-02-26 NOTE — Addendum Note (Signed)
Addended by: Kevan Ny on: 02/26/2018 11:40 AM   Modules accepted: Orders

## 2018-02-26 NOTE — Progress Notes (Signed)
Patient: Sara [name] MRN: [id number] DOB: 1945/08/03 PCP: Orma Flaming, MD     Subjective:  Chief Complaint  Patient presents with  . follow up kidney fx  . Hypertension    HPI: The patient is a 72 y.o. female who presents today for hypertension follow.   Hypertension: Here for follow up of hypertension.  Currently on norvasc 5mg . Home readings range from 106 YIRSWNIO/27-03 diastolic. Takes medication as prescribed and denies any side effects. Exercise includes walking. Weight has been stable. Denies any chest pain, headaches, shortness of breath, vision changes, swelling in lower extremities.   Renal function elevated in July. No nsaids and rechecking today.    Review of Systems  Constitutional: Negative for fatigue.  Respiratory: Negative for shortness of breath.   Cardiovascular: Negative for chest pain.  Gastrointestinal: Negative for abdominal pain and nausea.  Genitourinary: Negative for difficulty urinating, dysuria, flank pain, frequency and pelvic pain.  Musculoskeletal: Positive for back pain. Negative for neck pain.  Neurological: Negative for dizziness and headaches.    Allergies Patient is allergic to augmentin [amoxicillin-pot clavulanate].  Past Medical History Patient  has a past medical history of Barrett's esophagus, GERD (gastroesophageal reflux disease), HIATAL HERNIA, HYPERTENSION, and Kidney stone.  Surgical History Patient  has a past surgical history that includes Tonsillectomy (1968); Cataract extraction (2009); Lithotripsy; Vaginal hysterectomy (N/A, 06/12/2012); Cystocele repair (N/A, 06/12/2012); Hiatal hernia repair (2011); Cholecystectomy (11/02/2012); Upper gi endoscopy (11/02/2012); and Upper gastrointestinal endoscopy.  Family History Pateint's family history includes Breast cancer in her maternal grandmother and other; Cancer in her mother; Colon cancer in her paternal aunt; Heart disease in her other; Hypertension in her  other.  Social History Patient  reports that she has never smoked. She has never used smokeless tobacco. She reports that she does not drink alcohol or use drugs.    Objective: Vitals:   02/26/18 1004 02/26/18 1030  BP: 140/90 128/72  Pulse: 64   Temp: 97.7 F (36.5 C)   TempSrc: Oral   SpO2: 100%   Weight: 163 lb 3.2 oz (74 kg)   Height: 5\' 8"  (1.727 m)     Body mass index is 24.81 kg/m.  Physical Exam  Constitutional: She is oriented to person, place, and time. She appears well-developed and well-nourished.  HENT:  Right Ear: External ear normal.  Left Ear: External ear normal.  Mouth/Throat: No oropharyngeal exudate.  Tm pearly with light reflex bilaterally    Neck: Normal range of motion. Neck supple. No thyromegaly present.  Cardiovascular: Normal rate, regular rhythm and normal heart sounds.  Pulmonary/Chest: Effort normal and breath sounds normal.  Abdominal: Soft. Bowel sounds are normal.  Lymphadenopathy:    She has no cervical adenopathy.  Neurological: She is alert and oriented to person, place, and time.  Psychiatric: She has a normal mood and affect. Her behavior is normal.  Vitals reviewed.      Assessment/plan: 1. Essential hypertension Blood pressure is to goal. Continue current anti-hypertensive medications. No refills given and routine lab work will be done today. Recommended routine exercise and healthy diet including DASH diet and mediterranean diet. Encouraged weight loss. F/u in 6 months.   - Comprehensive metabolic panel - CBC with Differential/Platelet   Flu shot today    Return in about 6 months (around 08/28/2018) for htn/labs .   Orma Flaming, MD [place]   02/26/2018

## 2018-02-26 NOTE — Progress Notes (Signed)
PROCEDURE NOTE: THERAPEUTIC EXERCISES (97110) 15 minutes spent for Therapeutic exercises as below and as referenced in the AVS.  This included exercises focusing on stretching, strengthening, with significant focus on eccentric aspects.   Proper technique shown and discussed handout in great detail with ATC.  All questions were discussed and answered.   Long term goals include an improvement in range of motion, strength, endurance as well as avoiding reinjury. Frequency of visits is one time as determined during today's  office visit. Frequency of exercises to be performed is as per handout.  EXERCISES REVIEWED: 4 way ANKLE exercises Pigeon Toed walking Ankle ABC exercises

## 2018-02-26 NOTE — Patient Instructions (Addendum)
Please perform the exercise program that we have prepared for you and gone over in detail on a daily basis.  In addition to the handout you were provided you can access your program through: [email or web address]   Your unique program code is:  [email or web address]

## 2018-04-09 ENCOUNTER — Ambulatory Visit: Payer: Medicare Other | Admitting: Sports Medicine

## 2018-04-28 ENCOUNTER — Ambulatory Visit: Payer: Self-pay

## 2018-04-28 ENCOUNTER — Encounter: Payer: Self-pay | Admitting: Sports Medicine

## 2018-04-28 ENCOUNTER — Ambulatory Visit: Payer: Medicare Other | Admitting: Sports Medicine

## 2018-04-28 VITALS — BP 138/82 | HR 65

## 2018-04-28 DIAGNOSIS — M7671 Peroneal tendinitis, right leg: Secondary | ICD-10-CM

## 2018-04-28 NOTE — Progress Notes (Signed)
Sara [name]. Sara [name], [place] at [place]  Sara [name] - 73 y.o. female MRN [id number]  Date of birth: 04/22/1945  Visit Date: April 28, 2018  PCP: Orma Flaming, MD   Referred by: Orma Flaming, MD  SUBJECTIVE:  Chief Complaint  Patient presents with  . Right Ankle - Follow-up    XR R ankle/foot 01/29/2018. Following Nitro Protocol. Provided with HEP - 4 way ankle, Pigeon toed walking, Ankle ABC.     HPI: Patient is here for a 6-week reevaluation, 12 weeks total treatment for right lateral ankle and leg pain.  She has continued to have mild tenderness and soreness.  She overall is better than at the last visit but did have a slight worsening over the past 1 week.  She gets occasional popping.  Mild amount of swelling that has reoccurred over the past 1 week had done better previous to this.  She is tolerating nitroglycerin protocol well without side effects.  She has been performing home therapeutic exercises including four-way ankle, pigeon toed walking and ankle ABCs daily but does have a small amount of pain with patient of walking in her hips.  REVIEW OF SYSTEMS: Denies fevers, chills, recent weight gain or weight loss.  No night sweats. No significant nighttime awakenings due to this issue. Pt denies any change in bowel or bladder habits, muscle weakness, numbness or falls associated with this pain.  HISTORY:  Prior history reviewed and updated per electronic medical record.  Patient Active Problem List   Diagnosis Date Noted  . Peroneal tendinitis of lower leg, right 02/09/2018  . Aortic atherosclerosis ([place]) 10/07/2017  . Peroneal tendinitis of right lower extremity 12/09/2016    XR 02/06/18: IMPRESSION: Previously seen lucency is less well visualized and likely artifactual in nature. A small bony density is noted adjacent to the distal aspect of the lateral malleolus new from the prior exam likely  representing a mildly displaced fracture.   . Partial tear of right Achilles tendon 11/15/2016  . Achilles tendinitis of right lower extremity 10/30/2016  . SI joint arthritis 06/17/2016  . CKD (chronic kidney disease) stage 3, GFR 30-59 ml/min (HCC) 12/16/2015    Mild, ? cause   . Low back pain 10/06/2015  . S/P laparoscopic cholecystectomy-for advanced necrotic gallbladder August 2014 11/05/2012  . GERD 02/20/2009    No GERD since hiatal hernia repair    . RENAL CALCULUS, HX OF 02/20/2009    Qualifier: Diagnosis of  By: Asa Lente MD, Jannifer Rodney    . Essential hypertension 02/14/2009  . Diaphragmatic hernia 02/14/2009    Large hernia S/p repair 2011 Recurred 2014 - small Moderate in size on ct scan 05/2016     Social History   Occupational History  . Occupation: Retired  Tobacco Use  . Smoking status: Never Smoker  . Smokeless tobacco: Never Used  . Tobacco comment: widowed since late 2011 (spouse had mod-adv dementia) 5 grown children, 3 nearby-,many g-kids  Substance and Sexual Activity  . Alcohol use: No  . Drug use: No  . Sexual activity: Yes    Birth control/protection: Post-menopausal   Social History   Social History Narrative  . Not on file    OBJECTIVE:  VS:  HT:    WT:   BMI:     BP:138/82  HR:65bpm  TEMP: ( )  RESP:98 %   PHYSICAL EXAM: Well-developed, Well-nourished and In no acute distress  Pupils are equal., EOM  intact without nystagmus. and No scleral icterus.  Alert & appropriately interactive. and Not depressed or anxious appearing.  Warm and well perfused   Right foot and ankle: Overall well aligned.  She is a small amount of swelling over the lateral ankle along the posterior aspect of the lateral malleolus.  There is a minimal amount of tenderness directly over the peroneal tendons.  She has good ankle dorsiflexion, plantarflexion, inversion and eversion strength and range of motion.  She has generalized osteoarthritic bossing of the  midfoot but this is mild.  No palpable defect of the peroneal tendons.   ASSESSMENT:  1. Peroneal tendinitis of lower leg, right     PROCEDURES:      PLAN:  Pertinent additional documentation may be included in corresponding procedure notes, imaging studies, problem based documentation and patient instructions.  No problem-specific Assessment & Plan notes found for this encounter.   MSK ultrasound is reassuring that there is no focal tearing but she does have a moderate degree of tenosynovitis of the peroneal tendons.  We discussed the options including continued conservative management with compression, icing, body helix compression sleeve, therapeutic exercises and nitroglycerin protocol.  She would like to continue with this but does understand that injection therapy is an option for her at any point.  Continue previously prescribed home exercise program.   Activity modifications and the importance of avoiding exacerbating activities (limiting pain to no more than a 4 / 10 during or following activity) recommended and discussed.   Discussed red flag symptoms that warrant earlier emergent evaluation and patient voices understanding.   At follow up will plan to consider : repeat MSK Ultrasound  Return in about 6 weeks (around 06/09/2018) for repeat clinical exam, repeat diagnostic ultrasound, consider injection if not better .          Gerda Diss, [place] [facility] Physician

## 2018-04-28 NOTE — Procedures (Signed)
LIMITED MSK ULTRASOUND OF Right ankle Images were obtained and interpreted by myself, Teresa Coombs, DO  Images have been saved and stored to PACS system. Images obtained on: GE S7 Ultrasound machine  FINDINGS:   Peroneal tendons are thickened with hypoechoic change surrounding them with positive halo sign and interstitial stippling of the tendon structure.  There is slight increased neovascularity  IMPRESSION:  1. Healing peroneal tenosynovitis with possible partial thickness tear

## 2018-04-30 ENCOUNTER — Encounter: Payer: Self-pay | Admitting: Sports Medicine

## 2018-04-30 NOTE — Progress Notes (Signed)
Sara [name]. Sara [name], [place] at [place]  Sara [name] - 73 y.o. female MRN [id number]  Date of birth: 09-13-1945  Visit Date: 02/26/2018  PCP: Orma Flaming, MD   Referred by: Orma Flaming, MD  SUBJECTIVE:   Chief Complaint  Patient presents with  . f/u R ankle/foot    Pain has improved. Mild swelling. Wearing Body Helix. Following Nitro Protocol and using Voltaren gel.     HPI: Patient is here for follow-up of her right ankle pain.  She reports doing significantly better.  She has been able to wean out of the fracture boot into the Body Helix Compression Sleeve.  REVIEW OF SYSTEMS: Per HPI  HISTORY:  Prior history reviewed and updated per electronic medical record.  Patient Active Problem List   Diagnosis Date Noted  . Peroneal tendinitis of lower leg, right 02/09/2018  . Aortic atherosclerosis ([place]) 10/07/2017  . Peroneal tendinitis of right lower extremity 12/09/2016    XR 02/06/18: IMPRESSION: Previously seen lucency is less well visualized and likely artifactual in nature. A small bony density is noted adjacent to the distal aspect of the lateral malleolus new from the prior exam likely representing a mildly displaced fracture.   . Partial tear of right Achilles tendon 11/15/2016  . Achilles tendinitis of right lower extremity 10/30/2016  . SI joint arthritis 06/17/2016  . CKD (chronic kidney disease) stage 3, GFR 30-59 ml/min (HCC) 12/16/2015    Mild, ? cause   . Low back pain 10/06/2015  . S/P laparoscopic cholecystectomy-for advanced necrotic gallbladder August 2014 11/05/2012  . GERD 02/20/2009    No GERD since hiatal hernia repair    . RENAL CALCULUS, HX OF 02/20/2009    Qualifier: Diagnosis of  By: Asa Lente MD, Jannifer Rodney    . Essential hypertension 02/14/2009  . Diaphragmatic hernia 02/14/2009    Large hernia S/p repair 2011 Recurred 2014 - small Moderate in size on ct scan  05/2016     Social History   Occupational History  . Occupation: Retired  Tobacco Use  . Smoking status: Never Smoker  . Smokeless tobacco: Never Used  . Tobacco comment: widowed since late 2011 (spouse had mod-adv dementia) 5 grown children, 3 nearby-,many g-kids  Substance and Sexual Activity  . Alcohol use: No  . Drug use: No  . Sexual activity: Yes    Birth control/protection: Post-menopausal   Social History   Social History Narrative  . Not on file    OBJECTIVE:  VS:  HT:5\' 8"  (172.7 cm)   WT:163 lb 3.2 oz (74 kg)  BMI:24.82    BP:128/72  HR:64bpm  TEMP: ( )  RESP:100 %   PHYSICAL EXAM: Adult female.  No acute distress.  Alert appropriate.  Her ankle is overall significantly improved appearing.  She has no significant swelling at this time.  Her foot has poor motor control with inversion, eversion and dorsiflexion/plantarflexion.   ASSESSMENT:   1. Peroneal tendinitis of lower leg, right   2. Peroneal tendinitis of right lower extremity   3. Acute right ankle pain     PROCEDURES:  Home Therapeutic exercises prescribed per procedure note.      PLAN:  Pertinent additional documentation may be included in corresponding procedure notes, imaging studies, problem based documentation and patient instructions.  No problem-specific Assessment & Plan notes found for this encounter.   Symptoms are overall improved.  We will have her continue with the nitroglycerin protocol and  progress her therapeutic exercises as outlined.  Home Therapeutic exercises prescribed today per procedure note.  TENDINOPATHY - Discussed that the anticipated amount of time for healing is 12- 24 weeks for Tendinopathic changes.  Emphasized the importance of improving blood flow as well as eccentric loading of the tendon.  Activity modifications and the importance of avoiding exacerbating activities (limiting pain to no more than a 4 / 10 during or following activity) recommended and  discussed.  Discussed red flag symptoms that warrant earlier emergent evaluation and patient voices understanding.  Return in about 6 weeks (around 04/09/2018) for repeat diagnostic ultrasound.          Gerda Diss, [place] [facility] Physician

## 2018-06-09 ENCOUNTER — Ambulatory Visit: Payer: Medicare Other | Admitting: Sports Medicine

## 2018-06-23 ENCOUNTER — Ambulatory Visit: Payer: Medicare Other | Admitting: Sports Medicine

## 2018-06-30 ENCOUNTER — Ambulatory Visit (INDEPENDENT_AMBULATORY_CARE_PROVIDER_SITE_OTHER): Payer: Medicare Other | Admitting: Sports Medicine

## 2018-06-30 ENCOUNTER — Encounter: Payer: Self-pay | Admitting: Sports Medicine

## 2018-06-30 VITALS — BP 127/68 | HR 58 | Ht 68.0 in | Wt 163.2 lb

## 2018-06-30 DIAGNOSIS — M25571 Pain in right ankle and joints of right foot: Secondary | ICD-10-CM

## 2018-06-30 DIAGNOSIS — M7671 Peroneal tendinitis, right leg: Secondary | ICD-10-CM | POA: Diagnosis not present

## 2018-06-30 MED ORDER — NITROGLYCERIN 0.2 MG/HR TD PT24: MEDICATED_PATCH | TRANSDERMAL | 1 refills | Status: DC

## 2018-06-30 NOTE — Progress Notes (Signed)
Sara [name]. Sara [name], [place] at [place]  Sara [name] - 73 y.o. female MRN [id number]  Date of birth: [birth year]  Visit Date: 06/30/2018  PCP: Orma Flaming, MD   Referred by: Orma Flaming, MD   Virtual Visit via Video (WebEx)  I connected with Orma Flaming on 06/30/18 at  8:20 AM EDT by a video enabled telemedicine application and verified that I am speaking with the correct person using two identifiers. Location patient: Home Location provider: Provider office Persons participating in the virtual visit: Patient only  I discussed the limitations of evaluation and management by telemedicine and the availability of in person appointments. The patient expressed understanding and agreed to proceed.   SUBJECTIVE:   Chief Complaint  Patient presents with  . Follow-up    R ankle pain / peroneal tendinopathy.  Been using nitro patches.  HEP of 4-way ankle, pigeon-toed waling and ankle ABCs    HPI: Patient reports 75% improvement overall of her right lateral ankle pain.  She reports it currently is at most severe mild.  With inversion she feels a slight twinge but denies any popping or snapping that she was previously experiencing.  She is using half a patch nitroglycerin on a daily basis with no side effects.  Continue with compression sleeve especially with activity as well as intermittent icing.  She has been off of her feet more due to work changes with [facility] it as well as increased her walking activity with good improvement.  REVIEW OF SYSTEMS: No significant nighttime awakenings due to this issue. Denies fevers, chills, recent weight gain or weight loss.  No night sweats.  Pt denies any change in bowel or bladder habits, muscle weakness, numbness or falls associated with this pain.  HISTORY:  Prior history reviewed and updated per electronic medical record.  Patient Active Problem List   Diagnosis Date  Noted  . Peroneal tendinitis of lower leg, right 02/09/2018  . Aortic atherosclerosis ([place]) 10/07/2017  . Peroneal tendinitis of right lower extremity 12/09/2016    XR 02/06/18: IMPRESSION: Previously seen lucency is less well visualized and likely artifactual in nature. A small bony density is noted adjacent to the distal aspect of the lateral malleolus new from the prior exam likely representing a mildly displaced fracture.   . Partial tear of right Achilles tendon 11/15/2016  . Achilles tendinitis of right lower extremity 10/30/2016  . SI joint arthritis 06/17/2016  . CKD (chronic kidney disease) stage 3, GFR 30-59 ml/min (HCC) 12/16/2015    Mild, ? cause   . Low back pain 10/06/2015  . S/P laparoscopic cholecystectomy-for advanced necrotic gallbladder August 2014 11/05/2012  . GERD 02/20/2009    No GERD since hiatal hernia repair    . RENAL CALCULUS, HX OF 02/20/2009    Qualifier: Diagnosis of  By: Asa Lente MD, Jannifer Rodney    . Essential hypertension 02/14/2009  . Diaphragmatic hernia 02/14/2009    Large hernia S/p repair 2011 Recurred 2014 - small Moderate in size on ct scan 05/2016     Social History   Occupational History  . Occupation: Retired  Tobacco Use  . Smoking status: Never Smoker  . Smokeless tobacco: Never Used  . Tobacco comment: widowed since late 2011 (spouse had mod-adv dementia) 5 grown children, 3 nearby-,many g-kids  Substance and Sexual Activity  . Alcohol use: No  . Drug use: No  . Sexual activity: Yes    Birth control/protection: Post-menopausal  Social History   Social History Narrative  . Not on file    OBJECTIVE:  VS:  HT:5\' 8"  (172.7 cm)   WT:163 lb 3.2 oz (74 kg)(Taken at home due to virtual visit)  BMI:24.82    BP:127/68(Pt took at home due to virtual visit)  HR:(!) 58(Taken at home due to virtual visit)bpm  TEMP: ( )  RESP:    PHYSICAL EXAM: GENERAL: Alert, appears well and in no acute distress. HEENT: Atraumatic,  conjunctiva clear, no obvious abnormalities on inspection of external nose and ears. NECK: Normal movements of the head and neck. CARDIOPULMONARY: No increased WOB. Speaking in clear sentences. I:E ratio WNL.  MS: Moves all visible extremities without noticeable abnormality. PSYCH: Pleasant and cooperative, well-groomed. Speech normal rate and rhythm. Affect is appropriate. Insight and judgement are appropriate. Attention is focused, linear, and appropriate.  NEURO: CN grossly intact. Oriented as arrived to appointment on time with no prompting. Moves both UE equally.  SKIN: No obvious lesions, wounds, erythema, or cyanosis noted on face or hands.    ASSESSMENT:   1. Peroneal tendinitis of lower leg, right   2. Acute right ankle pain     PROCEDURES:  None  PLAN:  Pertinent additional documentation may be included in corresponding procedure notes, imaging studies, problem based documentation and patient instructions.  No problem-specific Assessment & Plan notes found for this encounter.   Ultimately being 75% improved with no persistent mechanical symptoms and happy with her progress at this point.  I think an additional 6 weeks of nitroglycerin protocol with continued home therapeutic exercises icing, compression and activity modification will hopefully give complete resolution of symptoms.  We will plan for repeat exam and 6 weeks with possible repeat MSK ultrasound depending on the evolution of the COVID-19 crisis.  Home Therapeutic Exercises: Continue previously prescribed home exercise program  Activity modifications and the importance of avoiding exacerbating activities (limiting pain to no more than a 4 / 10 during or following activity) recommended and discussed.   Discussed red flag symptoms that warrant earlier emergent evaluation and patient voices understanding.   Meds ordered this encounter  Medications  . nitroGLYCERIN (NITRODUR - DOSED IN MG/24 HR) 0.2 mg/hr patch     Sig: Place 1/4 to 1/2 of a patch over affected region. Remove and replace once daily.  Slightly alter skin placement daily    Dispense:  30 patch    Refill:  1    For musculoskeletal purposes.  Okay to cut patch.   Lab Orders  No laboratory test(s) ordered today   Imaging Orders  No imaging studies ordered today   Referral Orders  No referral(s) requested today    Return in about 6 weeks (around 08/11/2018) for repeat clinical exam.          Gerda Diss, [place] [facility] Physician

## 2018-08-11 ENCOUNTER — Ambulatory Visit: Payer: Medicare Other | Admitting: Sports Medicine

## 2018-08-11 ENCOUNTER — Ambulatory Visit: Payer: Medicare Other | Admitting: Family Medicine

## 2018-08-19 ENCOUNTER — Other Ambulatory Visit: Payer: Self-pay | Admitting: Internal Medicine

## 2018-08-19 ENCOUNTER — Other Ambulatory Visit: Payer: Self-pay | Admitting: Family Medicine

## 2018-08-19 DIAGNOSIS — Z1231 Encounter for screening mammogram for malignant neoplasm of breast: Secondary | ICD-10-CM

## 2018-08-24 NOTE — Progress Notes (Signed)
[facility] [address], [place] [address] Phone: [phone] Subjective:   I Sara [name] am serving as a Education administrator for Dr. Hulan Saas.   CC:  Right ankle pain   [id number]  Sara [name] is a 73 y.o. female coming in with complaint of right ankle pain.  Patient did have an injury back in November.  Had x-rays at that time.  Possible lateral malleolus fracture.  Patient had been seen another provider and diagnosed with more of a peroneal tendinitis.  Patient last checked in with the other provider in April 2020 and was doing 75% better.  Patient was started on nitroglycerin.  Patient states the ankle has sharp pain at times. Has been told that it is both a displaced and nondisplaced fracture. Paulla Fore told patient that it was tendonitis.   Onset- chronic  Location - lateral Duration-  Character- Aggravating factors- standing for long periods of time  Reliving factors-  Therapies tried- Ice, topical  Severity- 5/10   xray 11/19- possible lateral mal fracture   Past Medical History:  Diagnosis Date  . Barrett's esophagus   . GERD (gastroesophageal reflux disease)    hx of   . HIATAL HERNIA    s/p surgical repair  . HYPERTENSION   . Kidney stone    1990s   Past Surgical History:  Procedure Laterality Date  . CATARACT EXTRACTION  2009   both eyes  . CHOLECYSTECTOMY  11/02/2012   Procedure: CHOLECYSTECTOMY;  Surgeon: Pedro Earls, MD;  Location: [facility];  Service: General;;  . [place] REPAIR N/A 06/12/2012   Procedure: ANTERIOR REPAIR (CYSTOCELE);  Surgeon: Elveria Royals, MD;  Location: [facility];  Service: Gynecology;  Laterality: N/A;  . HIATAL HERNIA REPAIR  2011  . LITHOTRIPSY    . TONSILLECTOMY  1968  . UPPER GASTROINTESTINAL ENDOSCOPY    . UPPER GI ENDOSCOPY  11/02/2012   Procedure: UPPER GI ENDOSCOPY;  Surgeon: Pedro Earls, MD;  Location: [facility];  Service: General;;  . VAGINAL HYSTERECTOMY N/A 06/12/2012   Procedure: HYSTERECTOMY VAGINAL;  Surgeon: Elveria Royals, MD;  Location: [facility];  Service: Gynecology;  Laterality: N/A;   Social History   Socioeconomic History  . Marital status: Widowed    Spouse name: Not on file  . Number of children: 5  . Years of education: 47  . Highest education level: Not on file  Occupational History  . Occupation: Retired  Scientific laboratory technician  . Financial resource strain: Not on file  . Food insecurity:    Worry: Not on file    Inability: Not on file  . Transportation needs:    Medical: Not on file    Non-medical: Not on file  Tobacco Use  . Smoking status: Never Smoker  . Smokeless tobacco: Never Used  . Tobacco comment: widowed since late 2011 (spouse had mod-adv dementia) 5 grown children, 3 nearby-,many g-kids  Substance and Sexual Activity  . Alcohol use: No  . Drug use: No  . Sexual activity: Yes    Birth control/protection: Post-menopausal  Lifestyle  . Physical activity:    Days per week: Not on file    Minutes per session: Not on file  . Stress: Not on file  Relationships  . Social connections:    Talks on phone: Not on file    Gets together: Not on file    Attends religious service: Not on file    Active member of club or  organization: Not on file    Attends meetings of clubs or organizations: Not on file    Relationship status: Not on file  Other Topics Concern  . Not on file  Social History Narrative  . Not on file   Allergies  Allergen Reactions  . Augmentin [Amoxicillin-Pot Clavulanate] Diarrhea   Family History  Problem Relation Age of Onset  . Cancer Mother   . Hypertension Other        Parent  . Breast cancer Other   . Heart disease Other        parent, grandparent  . Colon cancer Paternal Aunt   . Breast cancer Maternal Grandmother   . Esophageal cancer Neg Hx   . Rectal cancer Neg Hx   . Stomach cancer Neg Hx      Current Outpatient Medications (Cardiovascular):  .  amLODipine (NORVASC) 5 MG tablet, Take 1  tablet (5 mg total) by mouth every morning. .  nitroGLYCERIN (NITRODUR - DOSED IN MG/24 HR) 0.2 mg/hr patch, Place 1/4 to 1/2 of a patch over affected region. Remove and replace once daily.  Slightly alter skin placement daily    Current Outpatient Medications (Hematological):  Marland Kitchen  Cyanocobalamin (VITAMIN B-12 CR PO), Take by mouth daily.  Current Outpatient Medications (Other):  Marland Kitchen  Calcium Carbonate-Vitamin D (CALCIUM-VITAMIN D) 500-200 MG-UNIT per tablet, Take 1 tablet by mouth 2 (two) times daily with a meal. .  Cholecalciferol 1000 UNITS tablet, Take 1,000 Units by mouth daily.  .  diclofenac sodium (VOLTAREN) 1 % GEL, Apply 4 g topically 4 (four) times daily. .  Multiple Vitamin (MULTIVITAMIN WITH MINERALS) TABS, Take 1 tablet by mouth daily. .  pantoprazole (PROTONIX) 40 MG tablet, Take 1 tablet (40 mg total) by mouth daily. .  Probiotic Product (PROBIOTIC DAILY PO), Take by mouth daily. .  Vitamin D, Ergocalciferol, (DRISDOL) 1.25 MG (50000 UT) CAPS capsule, Take 1 capsule (50,000 Units total) by mouth every 7 (seven) days.    Past medical history, social, surgical and family history all reviewed in electronic medical record.  No pertanent information unless stated regarding to the chief complaint.   Review of Systems:  No headache, visual changes, nausea, vomiting, diarrhea, constipation, dizziness, abdominal pain, skin rash, fevers, chills, night sweats, weight loss, swollen lymph nodes, body aches, joint swelling, muscle aches, chest pain, shortness of breath, mood changes.   Objective  Blood pressure (!) 146/78, pulse 70, height 5\' 8"  (1.727 m), weight 169 lb (76.7 kg), SpO2 98 %.    General: No apparent distress alert and oriented x3 mood and affect normal, dressed appropriately.  HEENT: Pupils equal, extraocular movements intact  Respiratory: Patient's speak in full sentences and does not appear short of breath  Cardiovascular: No lower extremity edema, non tender, no  erythema  Skin: Warm dry intact with no signs of infection or rash on extremities or on axial skeleton.  Abdomen: Soft nontender  Neuro: Cranial nerves II through XII are intact, neurovascularly intact in all extremities with 2+ DTRs and 2+ pulses.  Lymph: No lymphadenopathy of posterior or anterior cervical chain or axillae bilaterally.  Gait mild antalgic MSK:  Non tender with full range of motion and good stability and symmetric strength and tone of shoulders, elbows, wrist, hip, knee bilaterally.  Right ankle shows the patient is and does have some bony abnormality on the lateral aspect of the ankle.  Tenderness to palpation over the inferior aspect of the lateral malleolus.  Patient does have mild pain  over the peroneal tendons.  Full range of motion are noted.  Neurovascular intact distally.  Limited musculoskeletal ultrasound was performed and interpreted by Lyndal Pulley  Limited ultrasound of patient's lateral ankle distally does show the patient has a nonhealing fracture of the lateral malleolus.  Does appear to be more of an avulsion.  Does have maybe mild angulation but nondisplaced.  Mild increase in hypoechoic changes in the surrounding area.  Patient's peroneal tendons are unremarkable. Impression: Nonhealing avulsion fracture   Impression and Recommendations:     This case required medical decision making of moderate complexity. The above documentation has been reviewed and is accurate and complete Lyndal Pulley, DO       Note: This dictation was prepared with Dragon dictation along with smaller phrase technology. Any transcriptional errors that result from this process are unintentional.

## 2018-08-25 ENCOUNTER — Encounter: Payer: Self-pay | Admitting: Family Medicine

## 2018-08-25 ENCOUNTER — Ambulatory Visit (INDEPENDENT_AMBULATORY_CARE_PROVIDER_SITE_OTHER): Payer: Medicare Other | Admitting: Family Medicine

## 2018-08-25 ENCOUNTER — Ambulatory Visit: Payer: Self-pay

## 2018-08-25 ENCOUNTER — Other Ambulatory Visit: Payer: Self-pay

## 2018-08-25 ENCOUNTER — Telehealth: Payer: Self-pay | Admitting: Family Medicine

## 2018-08-25 VITALS — BP 146/78 | HR 70 | Ht 68.0 in | Wt 169.0 lb

## 2018-08-25 DIAGNOSIS — G890 Central pain syndrome: Secondary | ICD-10-CM | POA: Diagnosis not present

## 2018-08-25 DIAGNOSIS — M25571 Pain in right ankle and joints of right foot: Secondary | ICD-10-CM | POA: Diagnosis not present

## 2018-08-25 DIAGNOSIS — G8929 Other chronic pain: Secondary | ICD-10-CM

## 2018-08-25 DIAGNOSIS — S8261XA Displaced fracture of lateral malleolus of right fibula, initial encounter for closed fracture: Secondary | ICD-10-CM | POA: Diagnosis not present

## 2018-08-25 MED ORDER — VITAMIN D (ERGOCALCIFEROL) 1.25 MG (50000 UNIT) PO CAPS: 50000.0000 [IU] | ORAL_CAPSULE | ORAL | 3 refills | Status: DC

## 2018-08-25 NOTE — Assessment & Plan Note (Signed)
Likely nonhealing avulsion fracture.  Discussed with patient in great length.  Aircast given today, topical anti-inflammatories, discontinue nitroglycerin.  Encourage vitamin D.  Encourage proper shoes.  Follow-up again in 4 weeks

## 2018-08-25 NOTE — Telephone Encounter (Signed)
Please advise when a good time to r/s. Copied from [place] [phone]. Topic: Appointment Scheduling - Scheduling Inquiry for Clinic >> Aug 25, 2018 10:06 AM Lennox Solders wrote: Reason for CRM pt needs to reschedule 09-03-2018 .

## 2018-08-25 NOTE — Patient Instructions (Signed)
K2 Daily Once weekly vitamin D

## 2018-08-26 ENCOUNTER — Telehealth: Payer: Self-pay | Admitting: Family Medicine

## 2018-08-26 NOTE — Telephone Encounter (Signed)
Scheduled for OV w/Dr. Rogers Blocker on 6/4.

## 2018-08-26 NOTE — Telephone Encounter (Signed)
Left detailed voicemail message on patient's cell phone requesting a call back to r/s appt.

## 2018-08-26 NOTE — Telephone Encounter (Signed)
When does pt need to come in ?  Copied from [place] [id number]. Topic: Appointment Scheduling - Scheduling Inquiry for Clinic >> Aug 26, 2018 11:47 AM Erick Blinks wrote: Reason for CRM: Pt called to reschedule appt. [phone] Please advise

## 2018-08-26 NOTE — Telephone Encounter (Signed)
Pt scheduled for OV w/Dr. Rogers Blocker on 6/4.

## 2018-08-27 ENCOUNTER — Ambulatory Visit (INDEPENDENT_AMBULATORY_CARE_PROVIDER_SITE_OTHER): Payer: Medicare Other | Admitting: Family Medicine

## 2018-08-27 ENCOUNTER — Encounter: Payer: Self-pay | Admitting: Family Medicine

## 2018-08-27 ENCOUNTER — Other Ambulatory Visit: Payer: Self-pay

## 2018-08-27 VITALS — BP 128/84 | HR 75 | Temp 97.9°F | Ht 68.0 in | Wt 167.2 lb

## 2018-08-27 DIAGNOSIS — R8290 Unspecified abnormal findings in urine: Secondary | ICD-10-CM

## 2018-08-27 DIAGNOSIS — N183 Chronic kidney disease, stage 3 unspecified: Secondary | ICD-10-CM

## 2018-08-27 DIAGNOSIS — I10 Essential (primary) hypertension: Secondary | ICD-10-CM

## 2018-08-27 LAB — COMPREHENSIVE METABOLIC PANEL
ALT: 9 U/L (ref 0–35)
AST: 17 U/L (ref 0–37)
Albumin: 4.1 g/dL (ref 3.5–5.2)
Alkaline Phosphatase: 89 U/L (ref 39–117)
BUN: 15 mg/dL (ref 6–23)
CO2: 28 mEq/L (ref 19–32)
Calcium: 9.9 mg/dL (ref 8.4–10.5)
Chloride: 103 mEq/L (ref 96–112)
Creatinine, Ser: 1.15 mg/dL (ref 0.40–1.20)
GFR: 46.5 mL/min — ABNORMAL LOW (ref >60.00)
Glucose, Bld: 99 mg/dL (ref 70–99)
Potassium: 4.5 mEq/L (ref 3.5–5.1)
Sodium: 140 mEq/L (ref 135–145)
Total Bilirubin: 0.8 mg/dL (ref 0.2–1.2)
Total Protein: 6.7 g/dL (ref 6.0–8.3)

## 2018-08-27 LAB — CBC WITH DIFFERENTIAL/PLATELET
Basophils Absolute: 0 10*3/uL (ref 0.0–0.1)
Basophils Relative: 0.6 % (ref 0.0–3.0)
Eosinophils Absolute: 0.3 10*3/uL (ref 0.0–0.7)
Eosinophils Relative: 4.1 % (ref 0.0–5.0)
HCT: 45.9 % (ref 36.0–46.0)
Hemoglobin: 15.6 g/dL — ABNORMAL HIGH (ref 12.0–15.0)
Lymphocytes Relative: 25.6 % (ref 12.0–46.0)
Lymphs Abs: 1.7 10*3/uL (ref 0.7–4.0)
MCHC: 34 g/dL (ref 30.0–36.0)
MCV: 90.7 fl (ref 78.0–100.0)
Monocytes Absolute: 0.6 10*3/uL (ref 0.1–1.0)
Monocytes Relative: 8.9 % (ref 3.0–12.0)
Neutro Abs: 3.9 10*3/uL (ref 1.4–7.7)
Neutrophils Relative %: 60.8 % (ref 43.0–77.0)
Platelets: 279 10*3/uL (ref 150.0–400.0)
RBC: 5.06 Mil/uL (ref 3.87–5.11)
RDW: 14.3 % (ref 11.5–15.5)
WBC: 6.5 10*3/uL (ref 4.0–10.5)

## 2018-08-27 LAB — POCT URINALYSIS DIPSTICK
Bilirubin, UA: NEGATIVE
Blood, UA: NEGATIVE
Glucose, UA: NEGATIVE
Ketones, UA: NEGATIVE
Nitrite, UA: NEGATIVE
Protein, UA: NEGATIVE
Spec Grav, UA: 1.015 (ref 1.010–1.025)
Urobilinogen, UA: 0.2 E.U./dL
pH, UA: 5.5 (ref 5.0–8.0)

## 2018-08-27 MED ORDER — AMLODIPINE BESYLATE 5 MG PO TABS: 5.0000 mg | ORAL_TABLET | Freq: Every morning | ORAL | 3 refills | Status: DC

## 2018-08-27 NOTE — Patient Instructions (Signed)
Checking kidneys/urine today. Refilled your blood pressure medication.   You look great! See you back in 6 months!

## 2018-08-27 NOTE — Progress Notes (Signed)
Patient: Sara [name] MRN: [id number] DOB: 02/10/1946 PCP: Orma Flaming, MD     Subjective:  Chief Complaint  Patient presents with  . Hypertension    HPI: The patient is a 73 y.o. female who presents today for blood pressure follow up.   Hypertension: Here for follow up of hypertension.  Currently on norvasc 5mg  daily . Home readings range from 676H [id number]/28-36 diastolic. Takes medication as prescribed and denies any side effects. Exercise includes limited due to ankle issue. Weight has been stable. Denies any chest pain, headaches, shortness of breath, vision changes, swelling in lower extremities. She doesn't want to do all of her labs as she has been naughty with her diet and would like to wait on her lipid panel.   Recheck renal function today.   Also had some cloudy, foul smelling urine yesterday, but resolved. Denies any urinary symptoms today. No fever/chills, flank pain, dysuria or frequency/urgency.   Review of Systems  Constitutional: Negative for fatigue and fever.  Respiratory: Negative for shortness of breath.   Cardiovascular: Negative for chest pain.  Gastrointestinal: Negative for abdominal pain and nausea.  Genitourinary: Negative for decreased urine volume, dysuria, flank pain, frequency, hematuria and urgency.  Neurological: Negative for dizziness and headaches.  Psychiatric/Behavioral: Negative for sleep disturbance.    Allergies Patient is allergic to augmentin [amoxicillin-pot clavulanate].  Past Medical History Patient  has a past medical history of Barrett's esophagus, GERD (gastroesophageal reflux disease), HIATAL HERNIA, HYPERTENSION, and Kidney stone.  Surgical History Patient  has a past surgical history that includes Tonsillectomy (1968); Cataract extraction (2009); Lithotripsy; Vaginal hysterectomy (N/A, 06/12/2012); Cystocele repair (N/A, 06/12/2012); Hiatal hernia repair (2011); Cholecystectomy (11/02/2012); Upper gi endoscopy  (11/02/2012); and Upper gastrointestinal endoscopy.  Family History Pateint's family history includes Breast cancer in her maternal grandmother and another family member; Cancer in her mother; Colon cancer in her paternal aunt; Heart disease in an other family member; Hypertension in an other family member.  Social History Patient  reports that she has never smoked. She has never used smokeless tobacco. She reports that she does not drink alcohol or use drugs.    Objective: Vitals:   08/27/18 0919  BP: 128/84  Pulse: 75  Temp: 97.9 F (36.6 C)  TempSrc: Oral  SpO2: 99%  Weight: 167 lb 3.2 oz (75.8 kg)  Height: 5\' 8"  (1.727 m)    Body mass index is 25.42 kg/m.  Physical Exam Vitals signs reviewed.  Constitutional:      Appearance: Normal appearance.  Neck:     Musculoskeletal: Normal range of motion and neck supple.  Cardiovascular:     Rate and Rhythm: Normal rate and regular rhythm.     Pulses: Normal pulses.     Heart sounds: Normal heart sounds.  Pulmonary:     Effort: Pulmonary effort is normal.     Breath sounds: Normal breath sounds.  Abdominal:     General: Abdomen is flat. Bowel sounds are normal.     Palpations: Abdomen is soft.  Skin:    General: Skin is warm.     Capillary Refill: Capillary refill takes less than 2 seconds.  Neurological:     General: No focal deficit present.     Mental Status: She is alert and oriented to person, place, and time.  Psychiatric:        Mood and Affect: Mood normal.        Behavior: Behavior normal.        Assessment/plan: 1. Essential hypertension  Blood pressure is to goal. Continue current anti-hypertensive medications. Refills given and routine lab work will be done today. Recommended routine exercise and healthy diet including DASH diet and mediterranean diet. Encouraged weight loss. F/u in 6 months. Will do fasting lipid panel at that time.   - Comprehensive metabolic panel - Microalbumin / creatinine urine  ratio - CBC with Differential/Platelet  2. CKD (chronic kidney disease) stage 3, GFR 30-59 ml/min (HCC) Labs.   3. Foul urine smell -checking ua/culture. ua not impressive for infection, will wait for culture.    Return in about 6 months (around 02/26/2019) for awv/htn .    Orma Flaming, MD [place]   08/27/2018

## 2018-08-28 LAB — URINE CULTURE
MICRO NUMBER:: 537628
SPECIMEN QUALITY:: ADEQUATE

## 2018-09-03 ENCOUNTER — Ambulatory Visit: Payer: Medicare Other | Admitting: Family Medicine

## 2018-09-08 ENCOUNTER — Encounter: Payer: Self-pay | Admitting: Family Medicine

## 2018-09-08 ENCOUNTER — Other Ambulatory Visit: Payer: Self-pay

## 2018-09-08 ENCOUNTER — Ambulatory Visit: Payer: Self-pay

## 2018-09-08 ENCOUNTER — Ambulatory Visit: Payer: Medicare Other | Admitting: Family Medicine

## 2018-09-08 VITALS — BP 140/90 | HR 75 | Ht 68.0 in | Wt 167.0 lb

## 2018-09-08 DIAGNOSIS — M25571 Pain in right ankle and joints of right foot: Secondary | ICD-10-CM

## 2018-09-08 DIAGNOSIS — S8261XA Displaced fracture of lateral malleolus of right fibula, initial encounter for closed fracture: Secondary | ICD-10-CM | POA: Diagnosis not present

## 2018-09-08 NOTE — Assessment & Plan Note (Signed)
Improvement.  Discussed what activities to do, HEP given  Avoid walking out of the brace for a week  Continue the vitamin D  See me again in 4 weeks

## 2018-09-08 NOTE — Patient Instructions (Signed)
Good to see you  Exercises 3 times a week.  COntinue the braqce daily for a week  Second week do not wear it in the house but continue out of the house for another 2 weeks See me again in 3-4 weeks to make sure you are good to go

## 2018-09-08 NOTE — Progress Notes (Signed)
[name] [address] [place], [place] [address] Phone: [phone] Subjective:   Sara [name], am serving as a scribe for Dr. Hulan Saas.   CC: right ankle pain   [id number]   08/25/2018 Likely nonhealing avulsion fracture.  Discussed with patient in great length.  Aircast given today, topical anti-inflammatories, discontinue nitroglycerin.  Encourage vitamin D.  Encourage proper shoes.  Follow-up again in 4 weeks  09/08/2018 Sara [name] is a 73 y.o. female coming in with complaint of right ankle pain. Is using K2 and Vit D. Also wearing Aircast daily. Pain has improved. Swells over the base of the 5th with prolonged weight bearing.  States overall though feeling about 70% better.  Feels when making significant progress.  Not having as much pain on a regular basis.      Past Medical History:  Diagnosis Date  . Barrett's esophagus   . GERD (gastroesophageal reflux disease)    hx of   . HIATAL HERNIA    s/p surgical repair  . HYPERTENSION   . Kidney stone    1990s   Past Surgical History:  Procedure Laterality Date  . CATARACT EXTRACTION  2009   both eyes  . CHOLECYSTECTOMY  11/02/2012   Procedure: CHOLECYSTECTOMY;  Surgeon: Pedro Earls, MD;  Location: [facility];  Service: General;;  . [place] REPAIR N/A 06/12/2012   Procedure: ANTERIOR REPAIR (CYSTOCELE);  Surgeon: Elveria Royals, MD;  Location: [facility];  Service: Gynecology;  Laterality: N/A;  . HIATAL HERNIA REPAIR  2011  . LITHOTRIPSY    . TONSILLECTOMY  1968  . UPPER GASTROINTESTINAL ENDOSCOPY    . UPPER GI ENDOSCOPY  11/02/2012   Procedure: UPPER GI ENDOSCOPY;  Surgeon: Pedro Earls, MD;  Location: [facility];  Service: General;;  . VAGINAL HYSTERECTOMY N/A 06/12/2012   Procedure: HYSTERECTOMY VAGINAL;  Surgeon: Elveria Royals, MD;  Location: [facility];  Service: Gynecology;  Laterality: N/A;   Social History   Socioeconomic History  . Marital status: Widowed   Spouse name: Not on file  . Number of children: 5  . Years of education: 79  . Highest education level: Not on file  Occupational History  . Occupation: Retired  Scientific laboratory technician  . Financial resource strain: Not on file  . Food insecurity    Worry: Not on file    Inability: Not on file  . Transportation needs    Medical: Not on file    Non-medical: Not on file  Tobacco Use  . Smoking status: Never Smoker  . Smokeless tobacco: Never Used  . Tobacco comment: widowed since late 2011 (spouse had mod-adv dementia) 5 grown children, 3 nearby-,many g-kids  Substance and Sexual Activity  . Alcohol use: [name]  . Drug use: [name]  . Sexual activity: Yes    Birth control/protection: Post-menopausal  Lifestyle  . Physical activity    Days per week: Not on file    Minutes per session: Not on file  . Stress: Not on file  Relationships  . Social Herbalist on phone: Not on file    Gets together: Not on file    Attends religious service: Not on file    Active member of club or organization: Not on file    Attends meetings of clubs or organizations: Not on file    Relationship status: Not on file  Other Topics Concern  . Not on file  Social History Narrative  .  Not on file   Allergies  Allergen Reactions  . Augmentin [Amoxicillin-Pot Clavulanate] Diarrhea   Family History  Problem Relation Age of Onset  . Cancer Mother   . Hypertension Other        Parent  . Breast cancer Other   . Heart disease Other        parent, grandparent  . Colon cancer Paternal Aunt   . Breast cancer Maternal Grandmother   . Esophageal cancer Neg Hx   . Rectal cancer Neg Hx   . Stomach cancer Neg Hx      Current Outpatient Medications (Cardiovascular):  .  amLODipine (NORVASC) 5 MG tablet, Take 1 tablet (5 mg total) by mouth every morning.    Current Outpatient Medications (Hematological):  Marland Kitchen  Cyanocobalamin (VITAMIN B-12 CR PO), Take by mouth daily.  Current Outpatient Medications (Other):   Marland Kitchen  Calcium Carbonate-Vitamin D (CALCIUM-VITAMIN D) 500-200 MG-UNIT per tablet, Take 1 tablet by mouth 2 (two) times daily with a meal. .  Cholecalciferol 1000 UNITS tablet, Take 1,000 Units by mouth daily.  .  diclofenac sodium (VOLTAREN) 1 % GEL, Apply 4 g topically 4 (four) times daily. .  Multiple Vitamin (MULTIVITAMIN WITH MINERALS) TABS, Take 1 tablet by mouth daily. .  pantoprazole (PROTONIX) 40 MG tablet, Take 1 tablet (40 mg total) by mouth daily. .  Probiotic Product (PROBIOTIC DAILY PO), Take by mouth daily. .  Vitamin D, Ergocalciferol, (DRISDOL) 1.25 MG (50000 UT) CAPS capsule, Take 1 capsule (50,000 Units total) by mouth every 7 (seven) days.    Past medical history, social, surgical and family history all reviewed in electronic medical record.  [name] pertanent information unless stated regarding to the chief complaint.   Review of Systems:  [name] headache, visual changes, nausea, vomiting, diarrhea, constipation, dizziness, abdominal pain, skin rash, fevers, chills, night sweats, weight loss, swollen lymph nodes, body aches, joint swelling, , chest pain, shortness of breath, mood changes.  Mild positive muscle aches  Objective  Blood pressure 140/90, pulse 75, height 5\' 8"  (1.727 m), weight 167 lb (75.8 kg), SpO2 99 %.    General: [name] apparent distress alert and oriented x3 mood and affect normal, dressed appropriately.  HEENT: Pupils equal, extraocular movements intact  Respiratory: Patient's speak in full sentences and does not appear short of breath  Cardiovascular: [name] lower extremity edema, non tender, [name] erythema  Skin: Warm dry intact with [name] signs of infection or rash on extremities or on axial skeleton.  Abdomen: Soft nontender  Neuro: Cranial nerves II through XII are intact, neurovascularly intact in all extremities with 2+ DTRs and 2+ pulses.  Lymph: [name] lymphadenopathy of posterior or anterior cervical chain or axillae bilaterally.  Gait mild antalgic but improving  MSK:  tender with full range of motion and good stability and symmetric strength and tone of shoulders, elbows, wrist, hip, knee bilaterally.  Mild arthritic changes of multiple joints Ankle: Right ankle [name] visible erythema or swelling. Range of motion is full in all directions. Strength is 5/5 in all directions. Stable lateral and medial ligaments; squeeze test and kleiger test unremarkable; Talar dome nontender; [name] pain at base of 5th MT; [name] tenderness over cuboid; [name] tenderness over N spot or navicular prominence .  Mild pain over the lateral malleolus Contralateral ankle unremarkable  MSK US performed of: Right ankle pain This study was ordered, performed, and interpreted by Charlann Boxer D.O.  Foot/Ankle:   Right ankle pain over the lateral malleolus shows that there is  a callus formation.  Only a soft callus over the area.  [name] hard callus formation noted.  Mild increase in Doppler flow in the area.  X-rays of the ankle joint  IMPRESSION: Delayed healing of the lateral malleolus    Impression and Recommendations:     This case required medical decision making of moderate complexity. The above documentation has been reviewed and is accurate and complete Lyndal Pulley, DO       Note: This dictation was prepared with Dragon dictation along with smaller phrase technology. Any transcriptional errors that result from this process are unintentional.

## 2018-09-30 ENCOUNTER — Other Ambulatory Visit: Payer: Self-pay | Admitting: Internal Medicine

## 2018-10-06 ENCOUNTER — Ambulatory Visit: Payer: Medicare Other

## 2018-10-07 ENCOUNTER — Ambulatory Visit
Admission: RE | Admit: 2018-10-07 | Discharge: 2018-10-07 | Disposition: A | Discharge status: Home / Self Care | Payer: Medicare Other | Source: Ambulatory Visit | Attending: Family Medicine | Admitting: Family Medicine

## 2018-10-07 ENCOUNTER — Other Ambulatory Visit: Payer: Self-pay

## 2018-10-07 DIAGNOSIS — Z1231 Encounter for screening mammogram for malignant neoplasm of breast: Secondary | ICD-10-CM | POA: Diagnosis not present

## 2018-10-08 ENCOUNTER — Encounter: Payer: Self-pay | Admitting: Family Medicine

## 2018-10-08 ENCOUNTER — Ambulatory Visit: Payer: Self-pay

## 2018-10-08 ENCOUNTER — Ambulatory Visit (INDEPENDENT_AMBULATORY_CARE_PROVIDER_SITE_OTHER): Payer: Medicare Other | Admitting: Family Medicine

## 2018-10-08 VITALS — BP 122/86 | HR 75 | Ht 68.0 in | Wt 169.0 lb

## 2018-10-08 DIAGNOSIS — M25571 Pain in right ankle and joints of right foot: Secondary | ICD-10-CM | POA: Diagnosis not present

## 2018-10-08 DIAGNOSIS — G8929 Other chronic pain: Secondary | ICD-10-CM

## 2018-10-08 DIAGNOSIS — S8261XA Displaced fracture of lateral malleolus of right fibula, initial encounter for closed fracture: Secondary | ICD-10-CM

## 2018-10-08 NOTE — Assessment & Plan Note (Signed)
Patient is doing remarkably better at this time.  Does appear the patient was in consultation.  Encouraged with vitamin D still.  Encourage about home exercise.  We will continue to use the Aircast for the next 2 to 3 weeks regularly and with exercise for another month.  Then we will transition to a compression sleeve.  Follow-up again in 8 weeks

## 2018-10-08 NOTE — Progress Notes (Signed)
[facility] [address] [place], [place] [address] Phone: [phone] Subjective:   Sara [name], am serving as a scribe for Dr. Hulan Saas.  CC: Right ankle pain follow-up  [id number]   09/08/2018: Improvement.  Discussed what activities to do, HEP given  Avoid walking out of the brace for a week  Continue the vitamin D  See me again in 4 weeks  Update 10/08/2018: Sara [name] is a 73 y.o. female coming in for follow up for closed right lateral malleolus fracture. Is doing HEP. Does have soreness with exercises. Pain still occurring after walking. Pain over lateral malleolus. Is using Aircast brace today.  Patient would state that she is about 85% better.  Still some mild discomfort from time but nothing severe.      Past Medical History:  Diagnosis Date  . Barrett's esophagus   . GERD (gastroesophageal reflux disease)    hx of   . HIATAL HERNIA    s/p surgical repair  . HYPERTENSION   . Kidney stone    1990s   Past Surgical History:  Procedure Laterality Date  . CATARACT EXTRACTION  2009   both eyes  . CHOLECYSTECTOMY  11/02/2012   Procedure: CHOLECYSTECTOMY;  Surgeon: Pedro Earls, MD;  Location: [facility];  Service: General;;  . [place] REPAIR N/A 06/12/2012   Procedure: ANTERIOR REPAIR (CYSTOCELE);  Surgeon: Elveria Royals, MD;  Location: [facility];  Service: Gynecology;  Laterality: N/A;  . HIATAL HERNIA REPAIR  2011  . LITHOTRIPSY    . TONSILLECTOMY  1968  . UPPER GASTROINTESTINAL ENDOSCOPY    . UPPER GI ENDOSCOPY  11/02/2012   Procedure: UPPER GI ENDOSCOPY;  Surgeon: Pedro Earls, MD;  Location: [facility];  Service: General;;  . VAGINAL HYSTERECTOMY N/A 06/12/2012   Procedure: HYSTERECTOMY VAGINAL;  Surgeon: Elveria Royals, MD;  Location: [facility];  Service: Gynecology;  Laterality: N/A;   Social History   Socioeconomic History  . Marital status: Widowed    Spouse name: Not on file  . Number of children: 5  .  Years of education: 45  . Highest education level: Not on file  Occupational History  . Occupation: Retired  Scientific laboratory technician  . Financial resource strain: Not on file  . Food insecurity    Worry: Not on file    Inability: Not on file  . Transportation needs    Medical: Not on file    Non-medical: Not on file  Tobacco Use  . Smoking status: Never Smoker  . Smokeless tobacco: Never Used  . Tobacco comment: widowed since late 2011 (spouse had mod-adv dementia) 5 grown children, 3 nearby-,many g-kids  Substance and Sexual Activity  . Alcohol use: [name]  . Drug use: [name]  . Sexual activity: Yes    Birth control/protection: Post-menopausal  Lifestyle  . Physical activity    Days per week: Not on file    Minutes per session: Not on file  . Stress: Not on file  Relationships  . Social Herbalist on phone: Not on file    Gets together: Not on file    Attends religious service: Not on file    Active member of club or organization: Not on file    Attends meetings of clubs or organizations: Not on file    Relationship status: Not on file  Other Topics Concern  . Not on file  Social History Narrative  . Not on file  Allergies  Allergen Reactions  . Augmentin [Amoxicillin-Pot Clavulanate] Diarrhea   Family History  Problem Relation Age of Onset  . Cancer Mother   . Hypertension Other        Parent  . Breast cancer Other   . Heart disease Other        parent, grandparent  . Colon cancer Paternal Aunt   . Breast cancer Maternal Grandmother   . Esophageal cancer Neg Hx   . Rectal cancer Neg Hx   . Stomach cancer Neg Hx      Current Outpatient Medications (Cardiovascular):  .  amLODipine (NORVASC) 5 MG tablet, Take 1 tablet (5 mg total) by mouth every morning.    Current Outpatient Medications (Hematological):  Marland Kitchen  Cyanocobalamin (VITAMIN B-12 CR PO), Take by mouth daily.  Current Outpatient Medications (Other):  Marland Kitchen  Calcium Carbonate-Vitamin D (CALCIUM-VITAMIN D)  500-200 MG-UNIT per tablet, Take 1 tablet by mouth 2 (two) times daily with a meal. .  Cholecalciferol 1000 UNITS tablet, Take 1,000 Units by mouth daily.  .  diclofenac sodium (VOLTAREN) 1 % GEL, Apply 4 g topically 4 (four) times daily. .  Multiple Vitamin (MULTIVITAMIN WITH MINERALS) TABS, Take 1 tablet by mouth daily. .  pantoprazole (PROTONIX) 40 MG tablet, TAKE 1 TABLET BY MOUTH EVERY DAY .  Probiotic Product (PROBIOTIC DAILY PO), Take by mouth daily. .  Vitamin D, Ergocalciferol, (DRISDOL) 1.25 MG (50000 UT) CAPS capsule, Take 1 capsule (50,000 Units total) by mouth every 7 (seven) days.    Past medical history, social, surgical and family history all reviewed in electronic medical record.  [name] pertanent information unless stated regarding to the chief complaint.   Review of Systems:  [name] headache, visual changes, nausea, vomiting, diarrhea, constipation, dizziness, abdominal pain, skin rash, fevers, chills, night sweats, weight loss, swollen lymph nodes, body aches, joint swelling, muscle aches, chest pain, shortness of breath, mood changes.   Objective  Blood pressure 122/86, pulse 75, height 5\' 8"  (1.727 m), weight 169 lb (76.7 kg), SpO2 98 %.   General: [name] apparent distress alert and oriented x3 mood and affect normal, dressed appropriately.  HEENT: Pupils equal, extraocular movements intact  Respiratory: Patient's speak in full sentences and does not appear short of breath  Cardiovascular: [name] lower extremity edema, non tender, [name] erythema  Skin: Warm dry intact with [name] signs of infection or rash on extremities or on axial skeleton.  Abdomen: Soft nontender  Neuro: Cranial nerves II through XII are intact, neurovascularly intact in all extremities with 2+ DTRs and 2+ pulses.  Lymph: [name] lymphadenopathy of posterior or anterior cervical chain or axillae bilaterally.  Gait antalgic but improved from previous exam MSK:  tender with full range of motion and good stability and symmetric  strength and tone of shoulders, elbows, wrist, hip, knee bilaterally.  Mild arthritic changes of multiple joints Ankle: Right ankle [name] visible erythema or swelling. Range of motion is full in all directions. Strength is 5/5 in all directions. Pain over the lateral malleolus mild to moderate Talar dome nontender; [name] pain at base of 5th MT; [name] tenderness over cuboid; [name] tenderness over N spot or navicular prominence [name] tenderness on posterior aspects of lateral and medial malleolus [name] sign of peroneal tendon subluxations or tenderness to palpation Negative tarsal tunnel tinel's Able to walk 4 steps.  MSK US performed of: Right ankle This study was ordered, performed, and interpreted by Charlann Boxer D.O.  Foot/Ankle:   All structures visualized.   Talar  dome unremarkable  Ankle mortise does have some lateral joint space.  Lateral shows good callus formation.  The area.  Does have some reabsorption occurring  IMPRESSION: Interval healing    Impression and Recommendations:     This case required medical decision making of moderate complexity. The above documentation has been reviewed and is accurate and complete Lyndal Pulley, DO       Note: This dictation was prepared with Dragon dictation along with smaller phrase technology. Any transcriptional errors that result from this process are unintentional.

## 2018-10-08 NOTE — Patient Instructions (Signed)
Looks good Wear brace with exercise for 3 weeks, then compression sleeve thereafter Exercises 3x a week See me in 4 weeks if not perfect

## 2018-11-09 ENCOUNTER — Ambulatory Visit: Payer: Medicare Other | Admitting: Family Medicine

## 2018-11-09 ENCOUNTER — Other Ambulatory Visit: Payer: Self-pay

## 2018-11-09 ENCOUNTER — Ambulatory Visit (INDEPENDENT_AMBULATORY_CARE_PROVIDER_SITE_OTHER): Payer: Medicare Other | Admitting: Family Medicine

## 2018-11-09 ENCOUNTER — Ambulatory Visit (INDEPENDENT_AMBULATORY_CARE_PROVIDER_SITE_OTHER): Payer: Medicare Other

## 2018-11-09 ENCOUNTER — Encounter: Payer: Self-pay | Admitting: Family Medicine

## 2018-11-09 VITALS — BP 124/88 | HR 73 | Temp 98.2°F | Ht 68.0 in | Wt 168.0 lb

## 2018-11-09 DIAGNOSIS — M47816 Spondylosis without myelopathy or radiculopathy, lumbar region: Secondary | ICD-10-CM | POA: Diagnosis not present

## 2018-11-09 DIAGNOSIS — M8588 Other specified disorders of bone density and structure, other site: Secondary | ICD-10-CM | POA: Diagnosis not present

## 2018-11-09 DIAGNOSIS — M5136 Other intervertebral disc degeneration, lumbar region: Secondary | ICD-10-CM | POA: Diagnosis not present

## 2018-11-09 DIAGNOSIS — M545 Low back pain, unspecified: Secondary | ICD-10-CM

## 2018-11-09 DIAGNOSIS — M533 Sacrococcygeal disorders, not elsewhere classified: Secondary | ICD-10-CM | POA: Diagnosis not present

## 2018-11-09 NOTE — Patient Instructions (Signed)
I think you have sacroilitis. Inflammation of that joint area -exercises given -tylenol prn -voltaren gel daily up to four times a day  i'll let you know what radiologist reads xrays as Could also be from back issues If not better-discuss with Dr. Tamala Julian

## 2018-11-09 NOTE — Progress Notes (Signed)
Patient: Sara [name] MRN: [id number] DOB: 01-May-1945 PCP: Orma Flaming, MD     Subjective:  Chief Complaint  Patient presents with  . R Hip Pain    x 1 wk    HPI: The patient is a 73 y.o. female who presents today for right hip pain for one week. She had gone to reach for something and felt something pop in her right lower back. Pain is in her right SI joint. She felt it was swollen and hot. It started to get better, but is hurting her again. Pain is rated as a 1-2/10 right now. Pain is intermittent and when bad it is rated as a 7-8/10. No numbness or tingling down the leg and no weakness in her leg. She has relief when she bends over. Worse with right leg movement and sitting down. She has not taken any medication for this. She has not used her voltaren gel.   Review of Systems  Constitutional: Negative for chills, fatigue and fever.  HENT: Negative.   Eyes: Negative for visual disturbance.  Respiratory: Negative for cough and shortness of breath.   Cardiovascular: Negative.  Negative for chest pain.  Gastrointestinal: Positive for abdominal pain. Negative for diarrhea, nausea and vomiting.  Endocrine: Negative for polydipsia and polyuria.  Musculoskeletal: Positive for arthralgias. Negative for back pain, myalgias and neck pain.       R hip pain that travels into R buttock.  Skin: Negative.   Neurological: Negative for dizziness and headaches.  Psychiatric/Behavioral: Negative for sleep disturbance.    Allergies Patient is allergic to augmentin [amoxicillin-pot clavulanate].  Past Medical History Patient  has a past medical history of Barrett's esophagus, GERD (gastroesophageal reflux disease), HIATAL HERNIA, HYPERTENSION, and Kidney stone.  Surgical History Patient  has a past surgical history that includes Tonsillectomy (1968); Cataract extraction (2009); Lithotripsy; Vaginal hysterectomy (N/A, 06/12/2012); Cystocele repair (N/A, 06/12/2012); Hiatal hernia repair  (2011); Cholecystectomy (11/02/2012); Upper gi endoscopy (11/02/2012); and Upper gastrointestinal endoscopy.  Family History Pateint's family history includes Breast cancer in her maternal grandmother and another family member; Cancer in her mother; Colon cancer in her paternal aunt; Heart disease in an other family member; Hypertension in an other family member.  Social History Patient  reports that she has never smoked. She has never used smokeless tobacco. She reports that she does not drink alcohol or use drugs.    Objective: Vitals:   11/09/18 1030  BP: 124/88  Pulse: 73  Temp: 98.2 F (36.8 C)  TempSrc: Oral  SpO2: 97%  Weight: 168 lb (76.2 kg)  Height: 5\' 8"  (1.727 m)    Body mass index is 25.54 kg/m.  Physical Exam Vitals signs reviewed.  Constitutional:      Appearance: Normal appearance.  HENT:     Head: Normocephalic and atraumatic.  Musculoskeletal: Normal range of motion.        General: Tenderness (Right SI joint) present.     Comments: Negative straight leg test bilaterally  Strength 5/5 bilaterally in lower extremities.    Neurological:     General: No focal deficit present.     Mental Status: She is alert and oriented to person, place, and time.     Deep Tendon Reflexes: Reflexes normal.  Psychiatric:        Mood and Affect: Mood normal.        Behavior: Behavior normal.    Lumbar xray: degenerative changes, but no acute changes from last xray. Official read pending.  SI joint xray:  mild oa. Official read pending.     Assessment/plan: 1. Acute right-sided low back pain without sciatica sacroilitis vs. Muscle strain. Conservative therapy with SI exercises, voltaren gel, tylenol, heating pad. If not getting better, will have Dr. Tamala Julian examine her at her follow up sports med visit. Also, we know she has degenerative disc disease and if not getting better, may need to think of getting MRI.  - DG Lumbar Spine 2-3 Views; Future - DG Si Joints; Future  2.  Osteopenia of other site  - DG Bone Density; Future   Return if symptoms worsen or fail to improve.   Orma Flaming, MD [place]   11/09/2018

## 2018-11-13 ENCOUNTER — Encounter: Payer: Self-pay | Admitting: Family Medicine

## 2018-11-18 ENCOUNTER — Telehealth: Payer: Self-pay | Admitting: Family Medicine

## 2018-11-18 NOTE — Telephone Encounter (Signed)
I left a message asking the patient to call me at [phone] to schedule AWV with Courtney. VDM (Dee-Dee) °

## 2018-11-23 ENCOUNTER — Encounter: Payer: Self-pay | Admitting: Family Medicine

## 2018-11-23 ENCOUNTER — Other Ambulatory Visit: Payer: Self-pay

## 2018-11-23 ENCOUNTER — Telehealth: Payer: Self-pay | Admitting: Physical Therapy

## 2018-11-23 ENCOUNTER — Ambulatory Visit
Admission: RE | Admit: 2018-11-23 | Discharge: 2018-11-23 | Disposition: A | Discharge status: Home / Self Care | Payer: Medicare Other | Source: Ambulatory Visit | Attending: Family Medicine | Admitting: Family Medicine

## 2018-11-23 DIAGNOSIS — M8588 Other specified disorders of bone density and structure, other site: Secondary | ICD-10-CM

## 2018-11-23 NOTE — Telephone Encounter (Signed)
Copied from [place] [phone]. Topic: General - Other >> Nov 23, 2018  8:28 AM Keene Breath wrote: Reason for CRM: Patient called to request that [facility] call her regarding a bone density test.  CB# [phone]

## 2018-11-27 ENCOUNTER — Other Ambulatory Visit: Payer: Self-pay

## 2018-11-27 ENCOUNTER — Ambulatory Visit
Admission: RE | Admit: 2018-11-27 | Discharge: 2018-11-27 | Disposition: A | Discharge status: Home / Self Care | Payer: Medicare Other | Source: Ambulatory Visit | Attending: Family Medicine | Admitting: Family Medicine

## 2018-11-27 DIAGNOSIS — M8588 Other specified disorders of bone density and structure, other site: Secondary | ICD-10-CM

## 2018-12-02 ENCOUNTER — Encounter: Payer: Self-pay | Admitting: Family Medicine

## 2018-12-02 DIAGNOSIS — M8580 Other specified disorders of bone density and structure, unspecified site: Secondary | ICD-10-CM | POA: Insufficient documentation

## 2018-12-03 ENCOUNTER — Ambulatory Visit: Payer: Medicare Other | Admitting: Family Medicine

## 2018-12-06 NOTE — Progress Notes (Signed)
[name] [place], [name] [address] Phone: [phone] Subjective:   Sara [name], am serving as a scribe for Dr. Hulan Saas.   CC: Foot pain follow-up found to previously have a lateral malleolus fracture  [id number]   10/08/2018 Patient is doing remarkably better at this time.  Does appear the patient was in consultation.  Encouraged with vitamin D still.  Encourage about home exercise.  We will continue to use the Aircast for the next 2 to 3 weeks regularly and with exercise for another month.  Then we will transition to a compression sleeve.  Follow-up again in 8 weeks  Update 12/08/2018 Sara [name] is a 73 y.o. female coming in with complaint of right malleolus fracture. Patient states that she does wear compression sleeve daily. Has slight pain with weather changes. Is going to take last Vit D. Wants to know if she should refill.  Patient states he is feeling 95% better at this time.    Past Medical History:  Diagnosis Date  . Barrett's esophagus   . GERD (gastroesophageal reflux disease)    hx of   . HIATAL HERNIA    s/p surgical repair  . HYPERTENSION   . Kidney stone    1990s   Past Surgical History:  Procedure Laterality Date  . CATARACT EXTRACTION  2009   both eyes  . CHOLECYSTECTOMY  11/02/2012   Procedure: CHOLECYSTECTOMY;  Surgeon: Pedro Earls, MD;  Location: [facility];  Service: General;;  . [place] REPAIR N/A 06/12/2012   Procedure: ANTERIOR REPAIR (CYSTOCELE);  Surgeon: Elveria Royals, MD;  Location: [facility];  Service: Gynecology;  Laterality: N/A;  . HIATAL HERNIA REPAIR  2011  . LITHOTRIPSY    . TONSILLECTOMY  1968  . UPPER GASTROINTESTINAL ENDOSCOPY    . UPPER GI ENDOSCOPY  11/02/2012   Procedure: UPPER GI ENDOSCOPY;  Surgeon: Pedro Earls, MD;  Location: [facility];  Service: General;;  . VAGINAL HYSTERECTOMY N/A 06/12/2012   Procedure: HYSTERECTOMY VAGINAL;  Surgeon: Elveria Royals, MD;   Location: [facility];  Service: Gynecology;  Laterality: N/A;   Social History   Socioeconomic History  . Marital status: Widowed    Spouse name: Not on file  . Number of children: 5  . Years of education: 82  . Highest education level: Not on file  Occupational History  . Occupation: Retired  Scientific laboratory technician  . Financial resource strain: Not on file  . Food insecurity    Worry: Not on file    Inability: Not on file  . Transportation needs    Medical: Not on file    Non-medical: Not on file  Tobacco Use  . Smoking status: Never Smoker  . Smokeless tobacco: Never Used  . Tobacco comment: widowed since late 2011 (spouse had mod-adv dementia) 5 grown children, 3 nearby-,many g-kids  Substance and Sexual Activity  . Alcohol use: [name]  . Drug use: [name]  . Sexual activity: Yes    Birth control/protection: Post-menopausal  Lifestyle  . Physical activity    Days per week: Not on file    Minutes per session: Not on file  . Stress: Not on file  Relationships  . Social Herbalist on phone: Not on file    Gets together: Not on file    Attends religious service: Not on file    Active member of club or organization: Not on file    Attends  meetings of clubs or organizations: Not on file    Relationship status: Not on file  Other Topics Concern  . Not on file  Social History Narrative  . Not on file   Allergies  Allergen Reactions  . Augmentin [Amoxicillin-Pot Clavulanate] Diarrhea   Family History  Problem Relation Age of Onset  . Cancer Mother   . Hypertension Other        Parent  . Breast cancer Other   . Heart disease Other        parent, grandparent  . Colon cancer Paternal Aunt   . Breast cancer Maternal Grandmother   . Esophageal cancer Neg Hx   . Rectal cancer Neg Hx   . Stomach cancer Neg Hx      Current Outpatient Medications (Cardiovascular):  .  amLODipine (NORVASC) 5 MG tablet, Take 1 tablet (5 mg total) by mouth every morning.    Current Outpatient  Medications (Hematological):  Marland Kitchen  Cyanocobalamin (VITAMIN B-12 CR PO), Take by mouth daily.  Current Outpatient Medications (Other):  Marland Kitchen  Calcium Carbonate-Vitamin D (CALCIUM-VITAMIN D) 500-200 MG-UNIT per tablet, Take 1 tablet by mouth 2 (two) times daily with a meal. .  diclofenac sodium (VOLTAREN) 1 % GEL, Apply 4 g topically 4 (four) times daily. .  ergocalciferol (VITAMIN D2) 1.25 MG (50000 UT) capsule, Take 50,000 Units by mouth once a week. .  Multiple Vitamin (MULTIVITAMIN WITH MINERALS) TABS, Take 1 tablet by mouth daily. .  pantoprazole (PROTONIX) 40 MG tablet, TAKE 1 TABLET BY MOUTH EVERY DAY .  potassium chloride (KLOR-CON) 20 MEQ packet, Take by mouth 2 (two) times daily. .  Probiotic Product (PROBIOTIC DAILY PO), Take by mouth daily.    Past medical history, social, surgical and family history all reviewed in electronic medical record.  [name] pertanent information unless stated regarding to the chief complaint.   Review of Systems:  [name] headache, visual changes, nausea, vomiting, diarrhea, constipation, dizziness, abdominal pain, skin rash, fevers, chills, night sweats, weight loss, swollen lymph nodes, body aches, joint swelling, , chest pain, shortness of breath, mood changes.  Positive muscle aches  Objective  Blood pressure 122/86, pulse 70, height 5\' 8"  (1.727 m), weight 172 lb (78 kg), SpO2 98 %.    General: [name] apparent distress alert and oriented x3 mood and affect normal, dressed appropriately.  HEENT: Pupils equal, extraocular movements intact  Respiratory: Patient's speak in full sentences and does not appear short of breath  Cardiovascular: [name] lower extremity edema, non tender, [name] erythema  Skin: Warm dry intact with [name] signs of infection or rash on extremities or on axial skeleton.  Abdomen: Soft nontender  Neuro: Cranial nerves II through XII are intact, neurovascularly intact in all extremities with 2+ DTRs and 2+ pulses.  Lymph: [name] lymphadenopathy of posterior or  anterior cervical chain or axillae bilaterally.  Gait mild antalgic MSK:  tender with full range of motion and good stability and symmetric strength and tone of shoulders, elbows, wrist, hip, knee and ankles bilaterally.  Ankle: right ankle  [name] visible erythema or swelling.  Mild arthritic changes Range of motion is full in all directions. Strength is 5/5 in all directions. Stable lateral and medial ligaments; squeeze test and kleiger test unremarkable; Talar dome mild tenderness; [name] pain at base of 5th MT; [name] tenderness over cuboid; [name] tenderness over N spot or navicular prominence [name] tenderness on posterior aspects of lateral and medial malleolus [name] sign of peroneal tendon subluxations or tenderness to palpation Negative  tarsal tunnel tinel's Able to walk 4 steps. Contralateral ankle unremarkable     Impression and Recommendations:      The above documentation has been reviewed and is accurate and complete Lyndal Pulley, DO       Note: This dictation was prepared with Dragon dictation along with smaller phrase technology. Any transcriptional errors that result from this process are unintentional.

## 2018-12-08 ENCOUNTER — Encounter: Payer: Self-pay | Admitting: Family Medicine

## 2018-12-08 ENCOUNTER — Ambulatory Visit: Payer: Medicare Other | Admitting: Family Medicine

## 2018-12-08 ENCOUNTER — Other Ambulatory Visit: Payer: Self-pay

## 2018-12-08 ENCOUNTER — Ambulatory Visit (INDEPENDENT_AMBULATORY_CARE_PROVIDER_SITE_OTHER): Payer: Medicare Other

## 2018-12-08 DIAGNOSIS — Z23 Encounter for immunization: Secondary | ICD-10-CM

## 2018-12-08 DIAGNOSIS — S8261XA Displaced fracture of lateral malleolus of right fibula, initial encounter for closed fracture: Secondary | ICD-10-CM | POA: Diagnosis not present

## 2018-12-08 NOTE — Patient Instructions (Addendum)
Once weekly for 12 weeks and then Vit D 4000 IU daily See me again in 3 months

## 2018-12-08 NOTE — Assessment & Plan Note (Signed)
Patient is a more of a lateral malleolus fracture.  Patient is doing much better at this time.  No findings on exam today.  Does have some underlying arthritis and will need to be monitored but as long as patient is well can follow-up as needed.

## 2018-12-10 ENCOUNTER — Encounter: Payer: Self-pay | Admitting: Family Medicine

## 2019-02-10 ENCOUNTER — Telehealth: Payer: Self-pay | Admitting: Family Medicine

## 2019-02-10 NOTE — Telephone Encounter (Signed)
Contacted patient to advised of message below. Patient stated she understood and appreciated the call. She requested for Lea to contact her personally once this is corrected.   Copied from [place] [phone]. Topic: General - Other >> Feb 09, 2019  4:12 PM Virl Axe D wrote: Reason for CRM: Pt stated Dr. Rogers Blocker is not listed as in network for her [place]. She stated this has happened in the past. Please advise. >> Feb 10, 2019 10:22 AM Bella Kennedy C wrote: Please let patient know that this is being corrected. I will send an email to find out the status of this being taken care of.

## 2019-03-01 ENCOUNTER — Ambulatory Visit: Payer: Medicare Other | Admitting: Family Medicine

## 2019-03-04 NOTE — Telephone Encounter (Signed)
I called and updated patient to let her know that this is still in process. I will update her as soon as I hear something back from the credentialing department.

## 2019-03-09 ENCOUNTER — Ambulatory Visit: Payer: Medicare Other | Admitting: Family Medicine

## 2019-04-12 ENCOUNTER — Ambulatory Visit: Payer: Medicare Other | Admitting: Family Medicine

## 2019-04-19 ENCOUNTER — Other Ambulatory Visit: Payer: Self-pay

## 2019-04-20 ENCOUNTER — Encounter: Payer: Self-pay | Admitting: Physician Assistant

## 2019-04-20 ENCOUNTER — Ambulatory Visit (INDEPENDENT_AMBULATORY_CARE_PROVIDER_SITE_OTHER): Payer: Medicare Other | Admitting: Physician Assistant

## 2019-04-20 VITALS — BP 140/84 | HR 86 | Temp 97.9°F | Ht 68.0 in | Wt 172.5 lb

## 2019-04-20 DIAGNOSIS — R350 Frequency of micturition: Secondary | ICD-10-CM | POA: Diagnosis not present

## 2019-04-20 DIAGNOSIS — R8290 Unspecified abnormal findings in urine: Secondary | ICD-10-CM | POA: Diagnosis not present

## 2019-04-20 LAB — POCT URINALYSIS DIPSTICK
Bilirubin, UA: NEGATIVE
Blood, UA: NEGATIVE
Glucose, UA: NEGATIVE
Ketones, UA: NEGATIVE
Nitrite, UA: NEGATIVE
Protein, UA: NEGATIVE
Spec Grav, UA: 1.01 (ref 1.010–1.025)
Urobilinogen, UA: 0.2 E.U./dL
pH, UA: 6 (ref 5.0–8.0)

## 2019-04-20 NOTE — Patient Instructions (Signed)
It was great to see you!  I will be in touch with your urine culture results as soon as they have returned!  Take care,  Inda Coke PA-C  General instructions  Make sure you: ? Pee until your bladder is empty. ? Do not hold pee for a long time. ? Empty your bladder after sex. ? Wipe from front to back after pooping if you are a female. Use each tissue one time when you wipe.  Drink enough fluid to keep your pee pale yellow.  Keep all follow-up visits as told by your doctor. This is important. Contact a doctor if:  You do not get better after 1-2 days.  Your symptoms go away and then come back. Get help right away if:  You have very bad back pain.  You have very bad pain in your lower belly.  You have a fever.  You are sick to your stomach (nauseous).  You are throwing up.

## 2019-04-20 NOTE — Progress Notes (Signed)
Sara [name] is a 74 y.o. female here for a new problem.  I acted as a Education administrator for [employer], PA-C Anselmo Pickler, LPN  History of Present Illness:   Chief Complaint  Patient presents with  . Urinary Frequency  . urine odor    HPI   Urinary frequency Pt c/o urine cloudy on Saturday, urine frequency and odor started on Sunday. Pt took Azo x 3 days, none today. Denies fever, chills, malaise, hematuria, vaginal discharge and no new back pain, pt has degenerative disc disease.  Patient had a similar presentation in June 2020 where she did not require any antibiotics; urine culture was negative.    Past Medical History:  Diagnosis Date  . Barrett's esophagus   . GERD (gastroesophageal reflux disease)    hx of   . HIATAL HERNIA    s/p surgical repair  . HYPERTENSION   . Kidney stone    1990s     Social History   Socioeconomic History  . Marital status: Widowed    Spouse name: Not on file  . Number of children: 5  . Years of education: 81  . Highest education level: Not on file  Occupational History  . Occupation: Retired  Tobacco Use  . Smoking status: Never Smoker  . Smokeless tobacco: Never Used  . Tobacco comment: widowed since late 2011 (spouse had mod-adv dementia) 5 grown children, 3 nearby-,many g-kids  Substance and Sexual Activity  . Alcohol use: No  . Drug use: No  . Sexual activity: Yes    Birth control/protection: Post-menopausal  Other Topics Concern  . Not on file  Social History Narrative  . Not on file   Social Determinants of Health   Financial Resource Strain:   . Difficulty of Paying Living Expenses: Not on file  Food Insecurity:   . Worried About Charity fundraiser in the Last Year: Not on file  . Ran Out of Food in the Last Year: Not on file  Transportation Needs:   . Lack of Transportation (Medical): Not on file  . Lack of Transportation (Non-Medical): Not on file  Physical Activity:   . Days of Exercise per Week: Not  on file  . Minutes of Exercise per Session: Not on file  Stress:   . Feeling of Stress : Not on file  Social Connections:   . Frequency of Communication with Friends and Family: Not on file  . Frequency of Social Gatherings with Friends and Family: Not on file  . Attends Religious Services: Not on file  . Active Member of Clubs or Organizations: Not on file  . Attends Archivist Meetings: Not on file  . Marital Status: Not on file  Intimate Partner Violence:   . Fear of Current or Ex-Partner: Not on file  . Emotionally Abused: Not on file  . Physically Abused: Not on file  . Sexually Abused: Not on file    Past Surgical History:  Procedure Laterality Date  . CATARACT EXTRACTION  2009   both eyes  . CHOLECYSTECTOMY  11/02/2012   Procedure: CHOLECYSTECTOMY;  Surgeon: Pedro Earls, MD;  Location: [facility];  Service: General;;  . [place] REPAIR N/A 06/12/2012   Procedure: ANTERIOR REPAIR (CYSTOCELE);  Surgeon: Elveria Royals, MD;  Location: [facility];  Service: Gynecology;  Laterality: N/A;  . HIATAL HERNIA REPAIR  2011  . LITHOTRIPSY    . TONSILLECTOMY  1968  . UPPER GASTROINTESTINAL ENDOSCOPY    . UPPER GI  ENDOSCOPY  11/02/2012   Procedure: UPPER GI ENDOSCOPY;  Surgeon: Pedro Earls, MD;  Location: [facility];  Service: General;;  . VAGINAL HYSTERECTOMY N/A 06/12/2012   Procedure: HYSTERECTOMY VAGINAL;  Surgeon: Elveria Royals, MD;  Location: [facility];  Service: Gynecology;  Laterality: N/A;    Family History  Problem Relation Age of Onset  . Cancer Mother   . Hypertension Other        Parent  . Breast cancer Other   . Heart disease Other        parent, grandparent  . Colon cancer Paternal Aunt   . Breast cancer Maternal Grandmother   . Esophageal cancer Neg Hx   . Rectal cancer Neg Hx   . Stomach cancer Neg Hx     Allergies  Allergen Reactions  . Augmentin [Amoxicillin-Pot Clavulanate] Diarrhea    Current Medications:   Current Outpatient Medications:   .  amLODipine (NORVASC) 5 MG tablet, Take 1 tablet (5 mg total) by mouth every morning., Disp: 90 tablet, Rfl: 3 .  Calcium Carbonate-Vitamin D (CALCIUM-VITAMIN D) 500-200 MG-UNIT per tablet, Take 1 tablet by mouth 2 (two) times daily with a meal., Disp: , Rfl:  .  Cyanocobalamin (VITAMIN B-12 CR PO), Take by mouth daily., Disp: , Rfl:  .  Multiple Vitamin (MULTIVITAMIN WITH MINERALS) TABS, Take 1 tablet by mouth daily., Disp: , Rfl:  .  pantoprazole (PROTONIX) 40 MG tablet, TAKE 1 TABLET BY MOUTH EVERY DAY, Disp: 90 tablet, Rfl: 3 .  Probiotic Product (PROBIOTIC DAILY PO), Take by mouth daily., Disp: , Rfl:    Review of Systems:   ROS  Negative unless otherwise specified per HPI.   Vitals:   Vitals:   04/20/19 1056  BP: 140/84  Pulse: 86  Temp: 97.9 F (36.6 C)  TempSrc: Temporal  SpO2: 98%  Weight: 172 lb 8 oz (78.2 kg)  Height: 5\' 8"  (1.727 m)     Body mass index is 26.23 kg/m.  Physical Exam:   Physical Exam Vitals and nursing note reviewed.  Constitutional:      General: She is not in acute distress.    Appearance: She is well-developed. She is not ill-appearing or toxic-appearing.  Cardiovascular:     Rate and Rhythm: Normal rate and regular rhythm.     Pulses: Normal pulses.     Heart sounds: Normal heart sounds, S1 normal and S2 normal.     Comments: No LE edema Pulmonary:     Effort: Pulmonary effort is normal.     Breath sounds: Normal breath sounds.  Abdominal:     General: Abdomen is flat. Bowel sounds are normal.     Palpations: Abdomen is soft.     Tenderness: There is no abdominal tenderness. There is no right CVA tenderness or left CVA tenderness.  Skin:    General: Skin is warm and dry.  Neurological:     Mental Status: She is alert.     GCS: GCS eye subscore is 4. GCS verbal subscore is 5. GCS motor subscore is 6.  Psychiatric:        Speech: Speech normal.        Behavior: Behavior normal. Behavior is cooperative.     Results for orders  placed or performed in visit on 04/20/19  POCT urinalysis dipstick  Result Value Ref Range   Color, UA Yellow    Clarity, UA Cloudy    Glucose, UA Negative Negative   Bilirubin, UA Negative  Ketones, UA Negative    Spec Grav, UA 1.010 1.010 - 1.025   Blood, UA Negative    pH, UA 6.0 5.0 - 8.0   Protein, UA Negative Negative   Urobilinogen, UA 0.2 0.2 or 1.0 E.U./dL   Nitrite, UA Negative    Leukocytes, UA Small (1+) (A) Negative   Appearance     Odor      Assessment and Plan:   Kindy was seen today for urinary frequency and urine odor.  Diagnoses and all orders for this visit:  Frequency of urination -     POCT urinalysis dipstick -     Urine Culture   Discussed need to continue to hydrate.  Recommend that we hold off on antibiotics until urine culture returns, or if symptoms worsen. Patient verbalized understanding and is in agreement to plan. Worsening precautions advised.  . Reviewed expectations re: course of current medical issues. . Discussed self-management of symptoms. . Outlined signs and symptoms indicating need for more acute intervention. . Patient verbalized understanding and all questions were answered. . See orders for this visit as documented in the electronic medical record. . Patient received an After-Visit Summary.  CMA or LPN served as scribe during this visit. History, Physical, and Plan performed by medical provider. The above documentation has been reviewed and is accurate and complete.  Inda Coke, PA-C

## 2019-04-21 LAB — URINE CULTURE
MICRO NUMBER:: 10081804
Result:: NO GROWTH
SPECIMEN QUALITY:: ADEQUATE

## 2019-04-23 ENCOUNTER — Ambulatory Visit: Payer: Medicare Other

## 2019-05-01 ENCOUNTER — Ambulatory Visit: Payer: Medicare Other | Attending: Internal Medicine

## 2019-05-01 DIAGNOSIS — Z23 Encounter for immunization: Secondary | ICD-10-CM

## 2019-05-01 NOTE — Progress Notes (Signed)
   Covid-19 Vaccination Clinic  Name:  Sara [name]    MRN: [id number] DOB: [birth year]  05/01/2019  Sara [name] was observed post Covid-19 immunization for 15 minutes without incidence. She was provided with Vaccine Information Sheet and instruction to access the V-Safe system.   Sara [name] was instructed to call 911 with any severe reactions post vaccine: Marland Kitchen Difficulty breathing  . Swelling of your face and throat  . A fast heartbeat  . A bad rash all over your body  . Dizziness and weakness    Immunizations Administered    Name Date Dose VIS Date Route   Pfizer COVID-19 Vaccine 05/01/2019  3:56 PM 0.3 mL 03/05/2019 Intramuscular   Manufacturer: [place]   Lot: [id number]   [place]: [id number]

## 2019-05-10 ENCOUNTER — Ambulatory Visit: Payer: Medicare Other

## 2019-05-20 ENCOUNTER — Other Ambulatory Visit: Payer: Self-pay

## 2019-05-20 ENCOUNTER — Ambulatory Visit (INDEPENDENT_AMBULATORY_CARE_PROVIDER_SITE_OTHER): Payer: Medicare Other

## 2019-05-20 DIAGNOSIS — Z0000 Encounter for general adult medical examination without abnormal findings: Secondary | ICD-10-CM

## 2019-05-20 NOTE — Patient Instructions (Signed)
Sara [name] , Thank you for taking time to come for your Medicare Wellness Visit. I appreciate your ongoing commitment to your health goals. Please review the following plan we discussed and let me know if I can assist you in the future.   Screening recommendations/referrals: Colorectal Screening: up to date; last colonoscopy 02/17/18 (repeat 01/2021)  Mammogram: up to date; last 10/07/18 Bone Density: up to date; last 11/27/18  Vision and Dental Exams: Recommended annual ophthalmology exams for early detection of glaucoma and other disorders of the eye Recommended annual dental exams for proper oral hygiene  Vaccinations: Influenza vaccine: completed 12/08/18 Pneumococcal vaccine:   Tdap vaccine: recommended;. Please call your insurance company to determine your out of pocket expense. You may receive this vaccine at your local pharmacy or Health Dept. Shingles vaccine: Please call your insurance company to determine your out of pocket expense for the Shingrix vaccine. You may receive this vaccine at your local pharmacy. (see handout)   Advanced directives: Advance directives discussed with you today. I have provided a copy for you to complete at home and have notarized. Once this is complete please bring a copy in to our office so we can scan it into your chart.  Goals: Recommend to drink at least 6-8 8oz glasses of water per day and consume a balanced diet rich in fresh fruits and vegetables.   Next appointment: Please schedule your Annual Wellness Visit with your Nurse Health Advisor in one year.  Preventive Care 72 Years and Older, Female Preventive care refers to lifestyle choices and visits with your health care provider that can promote health and wellness. What does preventive care include?  A yearly physical exam. This is also called an annual well check.  Dental exams once or twice a year.  Routine eye exams. Ask your health care provider how often you should have your eyes  checked.  Personal lifestyle choices, including:  Daily care of your teeth and gums.  Regular physical activity.  Eating a healthy diet.  Avoiding tobacco and drug use.  Limiting alcohol use.  Practicing safe sex.  Taking low-dose aspirin every day if recommended by your health care provider.  Taking vitamin and mineral supplements as recommended by your health care provider. What happens during an annual well check? The services and screenings done by your health care provider during your annual well check will depend on your age, overall health, lifestyle risk factors, and family history of disease. Counseling  Your health care provider may ask you questions about your:  Alcohol use.  Tobacco use.  Drug use.  Emotional well-being.  Home and relationship well-being.  Sexual activity.  Eating habits.  History of falls.  Memory and ability to understand (cognition).  Work and work Statistician.  Reproductive health. Screening  You may have the following tests or measurements:  Height, weight, and BMI.  Blood pressure.  Lipid and cholesterol levels. These may be checked every 5 years, or more frequently if you are over 72 years old.  Skin check.  Lung cancer screening. You may have this screening every year starting at age 72 if you have a 30-pack-year history of smoking and currently smoke or have quit within the past 15 years.  Fecal occult blood test (FOBT) of the stool. You may have this test every year starting at age 72.  Flexible sigmoidoscopy or colonoscopy. You may have a sigmoidoscopy every 5 years or a colonoscopy every 10 years starting at age 72.  Hepatitis C blood  test.  Hepatitis B blood test.  Sexually transmitted disease (STD) testing.  Diabetes screening. This is done by checking your blood sugar (glucose) after you have not eaten for a while (fasting). You may have this done every 1-3 years.  Bone density scan. This is done to  screen for osteoporosis. You may have this done starting at age 72.  Mammogram. This may be done every 1-2 years. Talk to your health care provider about how often you should have regular mammograms. Talk with your health care provider about your test results, treatment options, and if necessary, the need for more tests. Vaccines  Your health care provider may recommend certain vaccines, such as:  Influenza vaccine. This is recommended every year.  Tetanus, diphtheria, and acellular pertussis (Tdap, Td) vaccine. You may need a Td booster every 10 years.  Zoster vaccine. You may need this after age 72.  Pneumococcal 13-valent conjugate (PCV13) vaccine. One dose is recommended after age 72.  Pneumococcal polysaccharide (PPSV23) vaccine. One dose is recommended after age 72. Talk to your health care provider about which screenings and vaccines you need and how often you need them. This information is not intended to replace advice given to you by your health care provider. Make sure you discuss any questions you have with your health care provider. Document Released: 04/07/2015 Document Revised: 11/29/2015 Document Reviewed: 01/10/2015 Elsevier Interactive Patient Education  2017 [place] Prevention in the Home Falls can cause injuries. They can happen to people of all ages. There are many things you can do to make your home safe and to help prevent falls. What can I do on the outside of my home?  Regularly fix the edges of walkways and driveways and fix any cracks.  Remove anything that might make you trip as you walk through a door, such as a raised step or threshold.  Trim any bushes or trees on the path to your home.  Use bright outdoor lighting.  Clear any walking paths of anything that might make someone trip, such as rocks or tools.  Regularly check to see if handrails are loose or broken. Make sure that both sides of any steps have handrails.  Any raised decks  and porches should have guardrails on the edges.  Have any leaves, snow, or ice cleared regularly.  Use sand or salt on walking paths during winter.  Clean up any spills in your garage right away. This includes oil or grease spills. What can I do in the bathroom?  Use night lights.  Install grab bars by the toilet and in the tub and shower. Do not use towel bars as grab bars.  Use non-skid mats or decals in the tub or shower.  If you need to sit down in the shower, use a plastic, non-slip stool.  Keep the floor dry. Clean up any water that spills on the floor as soon as it happens.  Remove soap buildup in the tub or shower regularly.  Attach bath mats securely with double-sided non-slip rug tape.  Do not have throw rugs and other things on the floor that can make you trip. What can I do in the bedroom?  Use night lights.  Make sure that you have a light by your bed that is easy to reach.  Do not use any sheets or blankets that are too big for your bed. They should not hang down onto the floor.  Have a firm chair that has side arms. You can  use this for support while you get dressed.  Do not have throw rugs and other things on the floor that can make you trip. What can I do in the kitchen?  Clean up any spills right away.  Avoid walking on wet floors.  Keep items that you use a lot in easy-to-reach places.  If you need to reach something above you, use a strong step stool that has a grab bar.  Keep electrical cords out of the way.  Do not use floor polish or wax that makes floors slippery. If you must use wax, use non-skid floor wax.  Do not have throw rugs and other things on the floor that can make you trip. What can I do with my stairs?  Do not leave any items on the stairs.  Make sure that there are handrails on both sides of the stairs and use them. Fix handrails that are broken or loose. Make sure that handrails are as long as the stairways.  Check any  carpeting to make sure that it is firmly attached to the stairs. Fix any carpet that is loose or worn.  Avoid having throw rugs at the top or bottom of the stairs. If you do have throw rugs, attach them to the floor with carpet tape.  Make sure that you have a light switch at the top of the stairs and the bottom of the stairs. If you do not have them, ask someone to add them for you. What else can I do to help prevent falls?  Wear shoes that:  Do not have high heels.  Have rubber bottoms.  Are comfortable and fit you well.  Are closed at the toe. Do not wear sandals.  If you use a stepladder:  Make sure that it is fully opened. Do not climb a closed stepladder.  Make sure that both sides of the stepladder are locked into place.  Ask someone to hold it for you, if possible.  Clearly mark and make sure that you can see:  Any grab bars or handrails.  First and last steps.  Where the edge of each step is.  Use tools that help you move around (mobility aids) if they are needed. These include:  Canes.  Walkers.  Scooters.  Crutches.  Turn on the lights when you go into a dark area. Replace any light bulbs as soon as they burn out.  Set up your furniture so you have a clear path. Avoid moving your furniture around.  If any of your floors are uneven, fix them.  If there are any pets around you, be aware of where they are.  Review your medicines with your doctor. Some medicines can make you feel dizzy. This can increase your chance of falling. Ask your doctor what other things that you can do to help prevent falls. This information is not intended to replace advice given to you by your health care provider. Make sure you discuss any questions you have with your health care provider. Document Released: 01/05/2009 Document Revised: 08/17/2015 Document Reviewed: 04/15/2014 Elsevier Interactive Patient Education  2017 [employer].

## 2019-05-20 NOTE — Progress Notes (Signed)
This visit is being conducted via phone call due to the COVID-19 pandemic. This patient has given me verbal consent via phone to conduct this visit, patient states they are participating from their home address. Some vital signs may be absent or patient reported.   Patient identification: identified by name, DOB, and current address.  Location provider: [facility], Office Persons participating in the virtual visit: Denman George LPN, patient, and Dr. Orma Flaming   Subjective:   Sara [name] is a 74 y.o. female who presents for Medicare Annual (Subsequent) preventive examination.  Review of Systems:   Cardiac Risk Factors include: advanced age (>60men, >70 women);hypertension    Objective:     Vitals: LMP  (LMP Unknown)   There is no height or weight on file to calculate BMI.  Advanced Directives 05/20/2019 02/17/2018 01/22/2017 05/16/2014 11/02/2012 10/29/2012 06/12/2012  Does Patient Have a Medical Advance Directive? No No No No Patient does not have advance directive;Patient would not like information Patient does not have advance directive Patient does not have advance directive  Would patient like information on creating a medical advance directive? Yes (MAU/Ambulatory/Procedural Areas - Information given) - - - - - -  Pre-existing out of facility DNR order (yellow form or pink MOST form) - - - - No - No    Tobacco Social History   Tobacco Use  Smoking Status Never Smoker  Smokeless Tobacco Never Used     Counseling given: Not Answered   Clinical Intake:  Pre-visit preparation completed: No  Pain : No/denies pain  Diabetes: No  How often do you need to have someone help you when you read instructions, pamphlets, or other written materials from your doctor or pharmacy?: 1 - Never  Interpreter Needed?: No  Information entered by :: Denman George LPN  Past Medical History:  Diagnosis Date  . Barrett's esophagus   . GERD (gastroesophageal reflux disease)      hx of   . HIATAL HERNIA    s/p surgical repair  . HYPERTENSION   . Kidney stone    1990s   Past Surgical History:  Procedure Laterality Date  . CATARACT EXTRACTION  2009   both eyes  . CHOLECYSTECTOMY  11/02/2012   Procedure: CHOLECYSTECTOMY;  Surgeon: Pedro Earls, MD;  Location: [facility];  Service: General;;  . [place] REPAIR N/A 06/12/2012   Procedure: ANTERIOR REPAIR (CYSTOCELE);  Surgeon: Elveria Royals, MD;  Location: [facility];  Service: Gynecology;  Laterality: N/A;  . HIATAL HERNIA REPAIR  2011  . LITHOTRIPSY    . TONSILLECTOMY  1968  . UPPER GASTROINTESTINAL ENDOSCOPY    . UPPER GI ENDOSCOPY  11/02/2012   Procedure: UPPER GI ENDOSCOPY;  Surgeon: Pedro Earls, MD;  Location: [facility];  Service: General;;  . VAGINAL HYSTERECTOMY N/A 06/12/2012   Procedure: HYSTERECTOMY VAGINAL;  Surgeon: Elveria Royals, MD;  Location: [facility];  Service: Gynecology;  Laterality: N/A;   Family History  Problem Relation Age of Onset  . Cancer Mother   . Hypertension Other        Parent  . Breast cancer Other   . Heart disease Other        parent, grandparent  . Colon cancer Paternal Aunt   . Breast cancer Maternal Grandmother   . Esophageal cancer Neg Hx   . Rectal cancer Neg Hx   . Stomach cancer Neg Hx    Social History   Socioeconomic History  . Marital status: Widowed  Spouse name: Not on file  . Number of children: 5  . Years of education: 6  . Highest education level: Not on file  Occupational History  . Occupation: Retired  Tobacco Use  . Smoking status: Never Smoker  . Smokeless tobacco: Never Used  Substance and Sexual Activity  . Alcohol use: No  . Drug use: No  . Sexual activity: Yes    Birth control/protection: Post-menopausal  Other Topics Concern  . Not on file  Social History Narrative   widowed since late 2011 (spouse had mod-adv dementia) 5 grown children, 3 nearby-,many g-kids   Social Determinants of Health   Financial Resource Strain:   .  Difficulty of Paying Living Expenses: Not on file  Food Insecurity:   . Worried About Charity fundraiser in the Last Year: Not on file  . Ran Out of Food in the Last Year: Not on file  Transportation Needs:   . Lack of Transportation (Medical): Not on file  . Lack of Transportation (Non-Medical): Not on file  Physical Activity:   . Days of Exercise per Week: Not on file  . Minutes of Exercise per Session: Not on file  Stress:   . Feeling of Stress : Not on file  Social Connections:   . Frequency of Communication with Friends and Family: Not on file  . Frequency of Social Gatherings with Friends and Family: Not on file  . Attends Religious Services: Not on file  . Active Member of Clubs or Organizations: Not on file  . Attends Archivist Meetings: Not on file  . Marital Status: Not on file    Outpatient Encounter Medications as of 05/20/2019  Medication Sig  . amLODipine (NORVASC) 5 MG tablet Take 1 tablet (5 mg total) by mouth every morning.  . Calcium Carbonate-Vitamin D (CALCIUM-VITAMIN D) 500-200 MG-UNIT per tablet Take 1 tablet by mouth 2 (two) times daily with a meal.  . Cyanocobalamin (VITAMIN B-12 CR PO) Take by mouth daily.  . Multiple Vitamin (MULTIVITAMIN WITH MINERALS) TABS Take 1 tablet by mouth daily.  . pantoprazole (PROTONIX) 40 MG tablet TAKE 1 TABLET BY MOUTH EVERY DAY  . Probiotic Product (PROBIOTIC DAILY PO) Take by mouth daily.   No facility-administered encounter medications on file as of 05/20/2019.    Activities of Daily Living In your present state of health, do you have any difficulty performing the following activities: 05/20/2019  Hearing? N  Vision? N  Difficulty concentrating or making decisions? N  Walking or climbing stairs? N  Dressing or bathing? N  Doing errands, shopping? N  Preparing Food and eating ? N  Using the Toilet? N  In the past six months, have you accidently leaked urine? N  Do you have problems with loss of bowel  control? N  Managing your Medications? N  Managing your Finances? N  Housekeeping or managing your Housekeeping? N  Some recent data might be hidden    Patient Care Team: Orma Flaming, MD as PCP - General (Family Medicine) Milus Banister, MD (Gastroenterology) Azucena Fallen, MD (Obstetrics and Gynecology) Johnathan Hausen, MD (General Surgery)    Assessment:   This is a routine wellness examination for [facility].  Exercise Activities and Dietary recommendations Current Exercise Habits: Home exercise routine, Type of exercise: walking;stretching, Time (Minutes): 30, Frequency (Times/Week): 4, Weekly Exercise (Minutes/Week): 120, Intensity: Mild  Goals   None     Fall Risk Fall Risk  05/20/2019 11/27/2017 10/30/2016 07/04/2015  Falls in the  past year? 0 No No No  Number falls in past yr: 0 - - -  Injury with Fall? 0 - - -  Follow up Falls evaluation completed;Education provided;Falls prevention discussed - - -   Is the patient's home free of loose throw rugs in walkways, pet beds, electrical cords, etc?   yes      Grab bars in the bathroom? yes      Handrails on the stairs?   yes      Adequate lighting?   yes  Depression Screen PHQ 2/9 Scores 05/20/2019 11/27/2017 10/30/2016 07/04/2015  PHQ - 2 Score 0 0 0 0     Cognitive Function- no cognitive concerns at this time    6CIT Screen 05/20/2019  What Year? 0 points  What month? 0 points  What time? 0 points  Count back from 20 0 points  Months in reverse 0 points  Repeat phrase 0 points  Total Score 0    Immunization History  Administered Date(s) Administered  . Fluad Quad(high Dose 65+) 12/08/2018  . Influenza Split 02/07/2012  . Influenza Whole 01/23/2009  . Influenza, High Dose Seasonal PF 02/26/2018  . Influenza,inj,Quad PF,6+ Mos 03/07/2014, 05/10/2016  . PFIZER SARS-COV-2 Vaccination 05/01/2019  . Pneumococcal Conjugate-13 03/07/2014  . Pneumococcal Polysaccharide-23 01/23/2009, 05/10/2016  . Td 01/23/2009     Qualifies for Shingles Vaccine?Discussed and patient will check with pharmacy for coverage.  Patient education handout provided   Screening Tests Health Maintenance  Topic Date Due  . TETANUS/TDAP  01/24/2019  . MAMMOGRAM  10/06/2020  . COLONOSCOPY  02/17/2021  . DEXA SCAN  11/26/2021  . INFLUENZA VACCINE  Completed  . Hepatitis C Screening  Completed  . PNA vac Low Risk Adult  Completed    Cancer Screenings: Lung: Low Dose CT Chest recommended if Age 51-80 years, 30 pack-year currently smoking OR have quit w/in 15years. Patient does not qualify. Breast:  Up to date on Mammogram? Yes   Up to date of Bone Density/Dexa? Yes Colorectal: colonoscopy 02/17/18 (repeat in 3 years)     Plan:  I have personally reviewed and addressed the Medicare Annual Wellness questionnaire and have noted the following in the patient's chart:  A. Medical and social history B. Use of alcohol, tobacco or illicit drugs  C. Current medications and supplements D. Functional ability and status E.  Nutritional status F.  Physical activity G. Advance directives H. List of other physicians I.  Hospitalizations, surgeries, and ER visits in previous 12 months J.  [place] such as hearing and vision if needed, cognitive and depression L. Referrals, records requested, and appointments- none   In addition, I have reviewed and discussed with patient certain preventive protocols, quality metrics, and best practice recommendations. A written personalized care plan for preventive services as well as general preventive health recommendations were provided to patient.   Signed,  Denman George, LPN  Nurse Health Advisor   Nurse Notes: no additional

## 2019-05-21 ENCOUNTER — Ambulatory Visit: Payer: Medicare Other

## 2019-05-26 ENCOUNTER — Ambulatory Visit: Payer: Medicare Other | Attending: Internal Medicine

## 2019-05-26 DIAGNOSIS — Z23 Encounter for immunization: Secondary | ICD-10-CM

## 2019-05-26 NOTE — Progress Notes (Signed)
   Covid-19 Vaccination Clinic  Name:  Sara [name]    MRN: [id number] DOB: 1945/11/19  05/26/2019  Sara [name] was observed post Covid-19 immunization for 15 minutes without incident. She was provided with Vaccine Information Sheet and instruction to access the V-Safe system.   Sara [name] was instructed to call 911 with any severe reactions post vaccine: Marland Kitchen Difficulty breathing  . Swelling of face and throat  . A fast heartbeat  . A bad rash all over body  . Dizziness and weakness   Immunizations Administered    Name Date Dose VIS Date Route   Pfizer COVID-19 Vaccine 05/26/2019 11:49 AM 0.3 mL 03/05/2019 Intramuscular   Manufacturer: [place]   Lot: [id number]   [place]: [id number]

## 2019-06-02 ENCOUNTER — Ambulatory Visit (INDEPENDENT_AMBULATORY_CARE_PROVIDER_SITE_OTHER): Payer: Medicare Other | Admitting: Family Medicine

## 2019-06-02 ENCOUNTER — Other Ambulatory Visit: Payer: Self-pay

## 2019-06-02 ENCOUNTER — Encounter: Payer: Self-pay | Admitting: Family Medicine

## 2019-06-02 VITALS — BP 158/80 | HR 61 | Temp 98.8°F | Ht 68.0 in | Wt 171.8 lb

## 2019-06-02 DIAGNOSIS — K449 Diaphragmatic hernia without obstruction or gangrene: Secondary | ICD-10-CM | POA: Diagnosis not present

## 2019-06-02 DIAGNOSIS — I10 Essential (primary) hypertension: Secondary | ICD-10-CM | POA: Diagnosis not present

## 2019-06-02 DIAGNOSIS — N1831 Chronic kidney disease, stage 3a: Secondary | ICD-10-CM | POA: Diagnosis not present

## 2019-06-02 LAB — COMPREHENSIVE METABOLIC PANEL
ALT: 11 U/L (ref 0–35)
AST: 16 U/L (ref 0–37)
Albumin: 4.2 g/dL (ref 3.5–5.2)
Alkaline Phosphatase: 85 U/L (ref 39–117)
BUN: 19 mg/dL (ref 6–23)
CO2: 27 mEq/L (ref 19–32)
Calcium: 10.2 mg/dL (ref 8.4–10.5)
Chloride: 102 mEq/L (ref 96–112)
Creatinine, Ser: 1.25 mg/dL — ABNORMAL HIGH (ref 0.40–1.20)
GFR: 42.15 mL/min — ABNORMAL LOW (ref >60.00)
Glucose, Bld: 86 mg/dL (ref 70–99)
Potassium: 4.5 mEq/L (ref 3.5–5.1)
Sodium: 136 mEq/L (ref 135–145)
Total Bilirubin: 0.7 mg/dL (ref 0.2–1.2)
Total Protein: 7 g/dL (ref 6.0–8.3)

## 2019-06-02 LAB — CBC WITH DIFFERENTIAL/PLATELET
Basophils Absolute: 0 10*3/uL (ref 0.0–0.1)
Basophils Relative: 0.4 % (ref 0.0–3.0)
Eosinophils Absolute: 0.3 10*3/uL (ref 0.0–0.7)
Eosinophils Relative: 3.7 % (ref 0.0–5.0)
HCT: 45.2 % (ref 36.0–46.0)
Hemoglobin: 15.2 g/dL — ABNORMAL HIGH (ref 12.0–15.0)
Lymphocytes Relative: 22.1 % (ref 12.0–46.0)
Lymphs Abs: 1.5 10*3/uL (ref 0.7–4.0)
MCHC: 33.7 g/dL (ref 30.0–36.0)
MCV: 92.6 fl (ref 78.0–100.0)
Monocytes Absolute: 0.6 10*3/uL (ref 0.1–1.0)
Monocytes Relative: 8.7 % (ref 3.0–12.0)
Neutro Abs: 4.5 10*3/uL (ref 1.4–7.7)
Neutrophils Relative %: 65.1 % (ref 43.0–77.0)
Platelets: 264 10*3/uL (ref 150.0–400.0)
RBC: 4.88 Mil/uL (ref 3.87–5.11)
RDW: 13.4 % (ref 11.5–15.5)
WBC: 6.9 10*3/uL (ref 4.0–10.5)

## 2019-06-02 LAB — LIPID PANEL
Cholesterol: 198 mg/dL (ref 0–200)
HDL: 56.3 mg/dL (ref >39.00)
LDL Cholesterol: 118 mg/dL — ABNORMAL HIGH (ref 0–99)
NonHDL: 142.03
Total CHOL/HDL Ratio: 4
Triglycerides: 122 mg/dL (ref 0.0–149.0)
VLDL: 24.4 mg/dL (ref 0.0–40.0)

## 2019-06-02 NOTE — Progress Notes (Deleted)
Patient: Sara [name] MRN: [id number] DOB: 1945/06/19 PCP: Orma Flaming, MD     Subjective:  No chief complaint on file.   HPI: The patient is a 74 y.o. female who presents today for ***  Review of Systems  Allergies Patient is allergic to augmentin [amoxicillin-pot clavulanate].  Past Medical History Patient  has a past medical history of Barrett's esophagus, GERD (gastroesophageal reflux disease), HIATAL HERNIA, HYPERTENSION, and Kidney stone.  Surgical History Patient  has a past surgical history that includes Tonsillectomy (1968); Cataract extraction (2009); Lithotripsy; Vaginal hysterectomy (N/A, 06/12/2012); Cystocele repair (N/A, 06/12/2012); Hiatal hernia repair (2011); Cholecystectomy (11/02/2012); Upper gi endoscopy (11/02/2012); and Upper gastrointestinal endoscopy.  Family History Pateint's family history includes Breast cancer in her maternal grandmother and another family member; Cancer in her mother; Colon cancer in her paternal aunt; Heart disease in an other family member; Hypertension in an other family member.  Social History Patient  reports that she has never smoked. She has never used smokeless tobacco. She reports that she does not drink alcohol or use drugs.    Objective: There were no vitals filed for this visit.  There is no height or weight on file to calculate BMI.  Physical Exam     Assessment/plan:      No follow-ups on file.     @[facility] @ 06/02/2019

## 2019-06-02 NOTE — Progress Notes (Signed)
Patient: Sara [name] MRN: [id number] DOB: [birth year]  PCP: Orma Flaming, MD     Subjective:  Chief Complaint  Patient presents with  . Hypertension  . Chronic Kidney Disease    HPI: The patient is a 74 y.o. female who presents today for routine hypertension follow up. Also has concerns about her hiatal hernia.   Hypertension: Here for follow up of hypertension.  Currently on norvasc. Home readings range from [id number] diastolic. Takes medication as prescribed and denies any side effects. Exercise includes walking. Weight has been stable. Denies any chest pain, headaches, shortness of breath, vision changes, swelling in lower extremities. Did not take her medication today.   Diaphragmatic hernia: known with repair in past-2011 by [facility] surgery. Ct in 2018 showed moderate size. She states she is starting to have increased intermittent GERD and pain. Is wondering if this is getting larger. She feels like her food gets stuck after eating. Pain is in epigastric area after eating only.   CKD: gfr stable at 46-50. Avoiding nsaids.   Has had both her covid shots.   Review of Systems  Constitutional: Negative for chills, fatigue and fever.  HENT: Negative for dental problem, ear pain, hearing loss and trouble swallowing.   Eyes: Negative for visual disturbance.  Respiratory: Negative for cough, chest tightness and shortness of breath.   Cardiovascular: Negative for chest pain, palpitations and leg swelling.  Gastrointestinal: Positive for abdominal distention and abdominal pain. Negative for blood in stool, diarrhea and nausea.  Endocrine: Negative for cold intolerance, polydipsia, polyphagia and polyuria.  Genitourinary: Negative for dysuria and hematuria.  Musculoskeletal: Negative for arthralgias.  Skin: Negative for rash.  Neurological: Negative for dizziness and headaches.  Psychiatric/Behavioral: Negative for dysphoric mood and sleep disturbance. The  patient is not nervous/anxious.     Allergies Patient is allergic to augmentin [amoxicillin-pot clavulanate].  Past Medical History Patient  has a past medical history of Barrett's esophagus, GERD (gastroesophageal reflux disease), HIATAL HERNIA, HYPERTENSION, and Kidney stone.  Surgical History Patient  has a past surgical history that includes Tonsillectomy (1968); Cataract extraction (2009); Lithotripsy; Vaginal hysterectomy (N/A, 06/12/2012); Cystocele repair (N/A, 06/12/2012); Hiatal hernia repair (2011); Cholecystectomy (11/02/2012); Upper gi endoscopy (11/02/2012); and Upper gastrointestinal endoscopy.  Family History Pateint's family history includes Breast cancer in her maternal grandmother and another family member; Cancer in her mother; Colon cancer in her paternal aunt; Heart disease in an other family member; Hypertension in an other family member.  Social History Patient  reports that she has never smoked. She has never used smokeless tobacco. She reports that she does not drink alcohol or use drugs.    Objective: Vitals:   06/02/19 1028 06/02/19 1109  BP: (!) 160/90 (!) 158/80  Pulse: 61   Temp: 98.8 F (37.1 C)   TempSrc: Temporal   SpO2: 97%   Weight: 171 lb 12.8 oz (77.9 kg)   Height: 5\' 8"  (1.727 m)     Body mass index is 26.12 kg/m.  Physical Exam Vitals reviewed.  Constitutional:      Appearance: Normal appearance. She is well-developed. She is obese.  HENT:     Right Ear: External ear normal.     Left Ear: External ear normal.  Eyes:     Conjunctiva/sclera: Conjunctivae normal.     Pupils: Pupils are equal, round, and reactive to light.  Neck:     Thyroid: No thyromegaly.  Cardiovascular:     Rate and Rhythm: Normal rate and regular rhythm.  Heart sounds: Normal heart sounds. No murmur.  Pulmonary:     Effort: Pulmonary effort is normal.     Breath sounds: Normal breath sounds.  Abdominal:     General: Abdomen is flat. Bowel sounds are normal.  There is no distension.     Palpations: Abdomen is soft.     Tenderness: There is no abdominal tenderness.     Comments: Negative murphy's.   Musculoskeletal:     Cervical back: Normal range of motion and neck supple.     Right lower leg: No edema.     Left lower leg: No edema.  Lymphadenopathy:     Cervical: No cervical adenopathy.  Skin:    General: Skin is warm and dry.     Capillary Refill: Capillary refill takes less than 2 seconds.     Findings: No rash.  Neurological:     General: No focal deficit present.     Mental Status: She is alert and oriented to person, place, and time.     Cranial Nerves: No cranial nerve deficit.     Coordination: Coordination normal.     Deep Tendon Reflexes: Reflexes normal.  Psychiatric:        Mood and Affect: Mood normal.        Behavior: Behavior normal.      Clinical Support from 05/20/2019 in [place]  PHQ-2 Total Score  0         Assessment/plan: 1. Essential hypertension Above goal today, but her home readings have been to goal and she did not take her medication today. Discussed I want her to take meds when she comes in so I can make sure she is well controlled. Fasting labs today. No refills needed at this time. F/u in 6 months or sooner if needed. Encouraged exercise and dash diet.  - Comprehensive metabolic panel - CBC with Differential/Platelet - Lipid panel  2. Stage 3a chronic kidney disease Stable. No NSAIDS and compliant with this.  - Comprehensive metabolic panel  3. Diaphragmatic hernia without obstruction and without gangrene Checking CT. If has grown will send back to [facility] and likely needs EGD. Continue PPI, GERD diet and encouraged weight loss. precautions given.  - CT Abdomen Pelvis Wo Contrast; Future  This visit occurred during the SARS-CoV-2 public health emergency.  Safety protocols were in place, including screening questions prior to the visit, additional usage of staff PPE, and  extensive cleaning of exam room while observing appropriate contact time as indicated for disinfecting solutions.     Return in about 6 months (around 12/03/2019) for blood pressure .     Orma Flaming, MD [place]  06/02/2019

## 2019-06-02 NOTE — Progress Notes (Deleted)
Patient: Sara [name] MRN: [id number] DOB: [birth year] PCP: Orma Flaming, MD     Subjective:  No chief complaint on file.   HPI: The patient is a 74 y.o. female who presents today for annual exam. She denies any changes to past medical history. There have been no recent hospitalizations. They are following a well balanced diet and exercise plan. Weight has been stable. No complaints today.   Immunization History  Administered Date(s) Administered  . Fluad Quad(high Dose 65+) 12/08/2018  . Influenza Split 02/07/2012  . Influenza Whole 01/23/2009  . Influenza, High Dose Seasonal PF 02/26/2018  . Influenza,inj,Quad PF,6+ Mos 03/07/2014, 05/10/2016  . PFIZER SARS-COV-2 Vaccination 05/01/2019, 05/26/2019  . Pneumococcal Conjugate-13 03/07/2014  . Pneumococcal Polysaccharide-23 01/23/2009, 05/10/2016  . Td 01/23/2009   Colonoscopy: Next due 02/17/2021 Mammogram: due 10/07/2019 will make appointment with them at that time.  Pap smear: Declines  PSA:   Review of Systems  Constitutional: Negative for activity change and appetite change.  HENT: Negative for congestion, nosebleeds, sneezing and tinnitus.   Eyes: Negative for discharge and itching.  Respiratory: Negative for cough, chest tightness and wheezing.   Cardiovascular: Negative for chest pain, palpitations and leg swelling.  Gastrointestinal: Negative for blood in stool, constipation, diarrhea, nausea and vomiting.       Increased GERD symptoms   Genitourinary: Negative for dysuria, flank pain and frequency.  Musculoskeletal: Negative for back pain, joint swelling and neck pain.       Increased hip pain ongoing exercises not helping. Has been seen by sports med as well.   Neurological: Negative for dizziness and headaches.  Psychiatric/Behavioral: Negative for confusion, hallucinations and suicidal ideas.    Allergies Patient is allergic to augmentin [amoxicillin-pot clavulanate].  Past Medical History Patient   has a past medical history of Barrett's esophagus, GERD (gastroesophageal reflux disease), HIATAL HERNIA, HYPERTENSION, and Kidney stone.  Surgical History Patient  has a past surgical history that includes Tonsillectomy (1968); Cataract extraction (2009); Lithotripsy; Vaginal hysterectomy (N/A, 06/12/2012); Cystocele repair (N/A, 06/12/2012); Hiatal hernia repair (2011); Cholecystectomy (11/02/2012); Upper gi endoscopy (11/02/2012); and Upper gastrointestinal endoscopy.  Family History Pateint's family history includes Breast cancer in her maternal grandmother and another family member; Cancer in her mother; Colon cancer in her paternal aunt; Heart disease in an other family member; Hypertension in an other family member.  Social History Patient  reports that she has never smoked. She has never used smokeless tobacco. She reports that she does not drink alcohol or use drugs.    Objective: There were no vitals filed for this visit.  There is no height or weight on file to calculate BMI.  Physical Exam     Assessment/plan:   No problem-specific Assessment & Plan notes found for this encounter.    No follow-ups on file.     Francella Solian, MD [facility]  06/02/2019

## 2019-06-02 NOTE — Patient Instructions (Signed)
-  Ct ordered for your hernia. They will call you with this.   -blood pressure high, but you didn't take medication. Home readings are good! Routine labs today.   So good to see you! Dr .Rogers Blocker

## 2019-06-03 ENCOUNTER — Encounter: Payer: Self-pay | Admitting: Family Medicine

## 2019-06-03 DIAGNOSIS — E785 Hyperlipidemia, unspecified: Secondary | ICD-10-CM | POA: Insufficient documentation

## 2019-06-04 ENCOUNTER — Other Ambulatory Visit: Payer: Self-pay

## 2019-06-04 MED ORDER — ROSUVASTATIN CALCIUM 5 MG PO TABS: 5.0000 mg | ORAL_TABLET | Freq: Every day | ORAL | 3 refills | Status: DC

## 2019-06-07 ENCOUNTER — Ambulatory Visit: Payer: Medicare Other | Admitting: Family Medicine

## 2019-06-07 ENCOUNTER — Ambulatory Visit: Payer: Self-pay

## 2019-06-07 ENCOUNTER — Other Ambulatory Visit: Payer: Self-pay

## 2019-06-07 ENCOUNTER — Encounter: Payer: Self-pay | Admitting: Family Medicine

## 2019-06-07 ENCOUNTER — Ambulatory Visit (INDEPENDENT_AMBULATORY_CARE_PROVIDER_SITE_OTHER): Payer: Medicare Other

## 2019-06-07 VITALS — BP 142/90 | HR 95 | Ht 68.0 in | Wt 174.0 lb

## 2019-06-07 DIAGNOSIS — M25571 Pain in right ankle and joints of right foot: Secondary | ICD-10-CM | POA: Diagnosis not present

## 2019-06-07 NOTE — Patient Instructions (Signed)
Thank you for coming in today. I think you probably stirred up the ankle joint.  Use either the sleeve or the ankle brace as needed.  If really bad ok to use the cam walker boot as needed.  Use the Voltaren gel as needed.  Ok to take tylenol as needed.  Do the exercises you learned last time.  If not improving we can do injection or physical therapy or even MRI.   Recheck with me or Dr Tamala Julian as needed.

## 2019-06-07 NOTE — Progress Notes (Signed)
Rito Ehrlich, am serving as a Education administrator for Dr. Lynne Leader.  Sara [name] is a 74 y.o. female who presents to [place] at [facility] today for f/u of R foot and ankle pain.  She last saw Dr. Tamala Julian on 12/08/18 for f/u of a R lateral malleolus fracture.  She was advised to con't on Vit D supplementation x 12 weeks and to wear and ankle compression sleeve.  Since her last visit, pt reports went walking with some friends Friday and was experiencing some hip pain and pushed through that and later that day noticed the R foot and ankle swelling with a little twinge pain 5/10. Patient has been trying to stay off the foot and propping it up   Diagnostic imaging: R foot and ankle XR- 01/29/18; R ankle XR- 02/06/18   Pertinent review of systems: No fevers or chills  Relevant historical information: Hiatal hernia   Exam:  BP (!) 142/90 (BP Location: Left Arm, Patient Position: Sitting, Cuff Size: Normal)   Pulse 95   Ht 5\' 8"  (1.727 m)   Wt 174 lb (78.9 kg)   LMP  (LMP Unknown)   SpO2 99%   BMI 26.46 kg/m  General: Well Developed, well nourished, and in no acute distress.   MSK: Right ankle: Slightly swollen anterior lateral ankle otherwise normal-appearing Normal ankle motion Minimally tender ATFL region. Normal strength. Stable ligamentous exam. Pulses capillary fill and sensation intact distally.    Lab and Radiology Results  Diagnostic Limited MSK Ultrasound of: Right ankle Normal bony structures no visible fractures. Mild effusion present anterior lateral ankle joint. Peroneal tendon crescent-shaped peroneal brevis tendon probable old longitudinal tear no acute tear visible. Impression: No acute fractures.  Mild joint effusion.  No acute new ligament or tendon injury.  X-ray images right ankle obtained today No acute fractures no severe degenerative changes Await formal radiology review   Assessment and Plan: 74 y.o. female with right ankle  pain.  Pain occurred after doing more walking than usual.  Patient describes an ankle effusion.  Likely this is exacerbation of DJD.  Plan for relative rest home exercise program and Voltaren gel trial.  Recommend compressive ankle sleeve for ankle brace.  Okay to use existing cam walker boot if needed.  Recheck back in a few weeks if not improving.    PDMP not reviewed this encounter. Orders Placed This Encounter  Procedures  . [place] LIMITED JOINT SPACE STRUCTURES LOW RIGHT(NO LINKED CHARGES)    Order Specific Question:   Reason for Exam (SYMPTOM  OR DIAGNOSIS REQUIRED)    Answer:   R ankle pain    Order Specific Question:   Preferred imaging location?    Answer:   [place]  . DG Ankle Complete Right    Standing Status:   Future    Standing Expiration Date:   08/06/2020    Order Specific Question:   Reason for Exam (SYMPTOM  OR DIAGNOSIS REQUIRED)    Answer:   eval ankle pain    Order Specific Question:   Preferred imaging location?    Answer:   [facility]    Order Specific Question:   Radiology Contrast Protocol - do NOT remove file path    Answer:   [email or web address]   No orders of the defined types were placed in this encounter.    Discussed warning signs or symptoms. Please see discharge instructions. Patient expresses understanding.   The above documentation has been  reviewed and is accurate and complete Lynne Leader

## 2019-06-08 NOTE — Progress Notes (Signed)
Xray does show an old unchanged appearance of the ankle bone on the outside part of your ankle.  It looks a little unusual but it has not changed in years.  This could represent an old injury.  Regardless no new acute changes such as fracture.  Plan to continue the plan we discussed in clinic yesterday.

## 2019-06-10 ENCOUNTER — Ambulatory Visit (INDEPENDENT_AMBULATORY_CARE_PROVIDER_SITE_OTHER)
Admission: RE | Admit: 2019-06-10 | Discharge: 2019-06-10 | Disposition: A | Discharge status: Home / Self Care | Payer: Medicare Other | Source: Ambulatory Visit | Attending: Family Medicine | Admitting: Family Medicine

## 2019-06-10 ENCOUNTER — Other Ambulatory Visit: Payer: Self-pay

## 2019-06-10 DIAGNOSIS — K449 Diaphragmatic hernia without obstruction or gangrene: Secondary | ICD-10-CM | POA: Diagnosis not present

## 2019-06-10 DIAGNOSIS — K5730 Diverticulosis of large intestine without perforation or abscess without bleeding: Secondary | ICD-10-CM | POA: Diagnosis not present

## 2019-06-13 ENCOUNTER — Encounter: Payer: Self-pay | Admitting: Family Medicine

## 2019-06-14 ENCOUNTER — Other Ambulatory Visit: Payer: Self-pay

## 2019-06-14 DIAGNOSIS — K449 Diaphragmatic hernia without obstruction or gangrene: Secondary | ICD-10-CM

## 2019-07-30 DIAGNOSIS — K449 Diaphragmatic hernia without obstruction or gangrene: Secondary | ICD-10-CM | POA: Diagnosis not present

## 2019-08-04 ENCOUNTER — Other Ambulatory Visit: Payer: Self-pay | Admitting: Surgery

## 2019-08-04 DIAGNOSIS — K449 Diaphragmatic hernia without obstruction or gangrene: Secondary | ICD-10-CM

## 2019-08-13 ENCOUNTER — Other Ambulatory Visit: Payer: Self-pay | Admitting: Family Medicine

## 2019-08-24 ENCOUNTER — Ambulatory Visit
Admission: RE | Admit: 2019-08-24 | Discharge: 2019-08-24 | Disposition: A | Discharge status: Home / Self Care | Payer: Medicare Other | Source: Ambulatory Visit | Attending: Surgery | Admitting: Surgery

## 2019-08-24 DIAGNOSIS — R140 Abdominal distension (gaseous): Secondary | ICD-10-CM | POA: Diagnosis not present

## 2019-08-24 DIAGNOSIS — K449 Diaphragmatic hernia without obstruction or gangrene: Secondary | ICD-10-CM

## 2019-08-29 ENCOUNTER — Other Ambulatory Visit: Payer: Self-pay | Admitting: Family Medicine

## 2019-09-16 DIAGNOSIS — K219 Gastro-esophageal reflux disease without esophagitis: Secondary | ICD-10-CM | POA: Diagnosis not present

## 2019-09-18 IMAGING — MG DIGITAL SCREENING BILATERAL MAMMOGRAM WITH TOMO AND CAD
6 of 10 series · 6 of 30 positions shown · non-contrast
Comparison: Previous exam(s).

CLINICAL DATA: Screening.

EXAM:
DIGITAL SCREENING BILATERAL MAMMOGRAM WITH TOMO AND CAD

[L MLO synth-2D (1 of 2)]
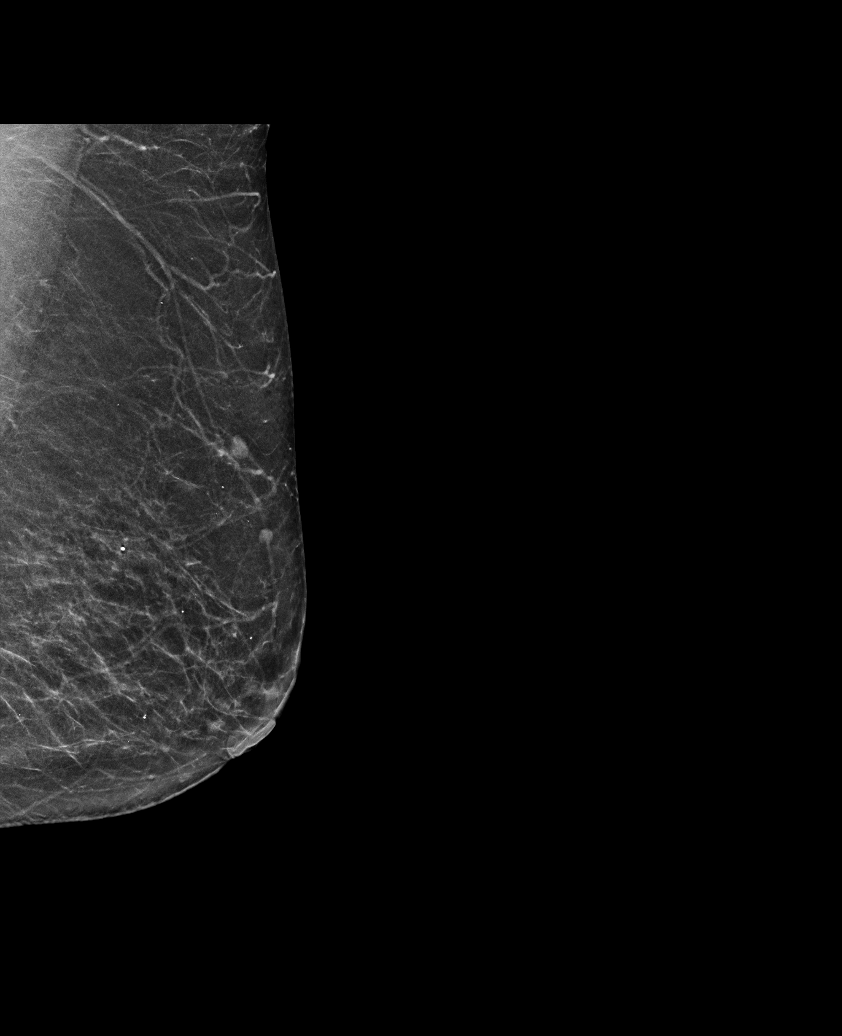

[R MLO synth-2D]
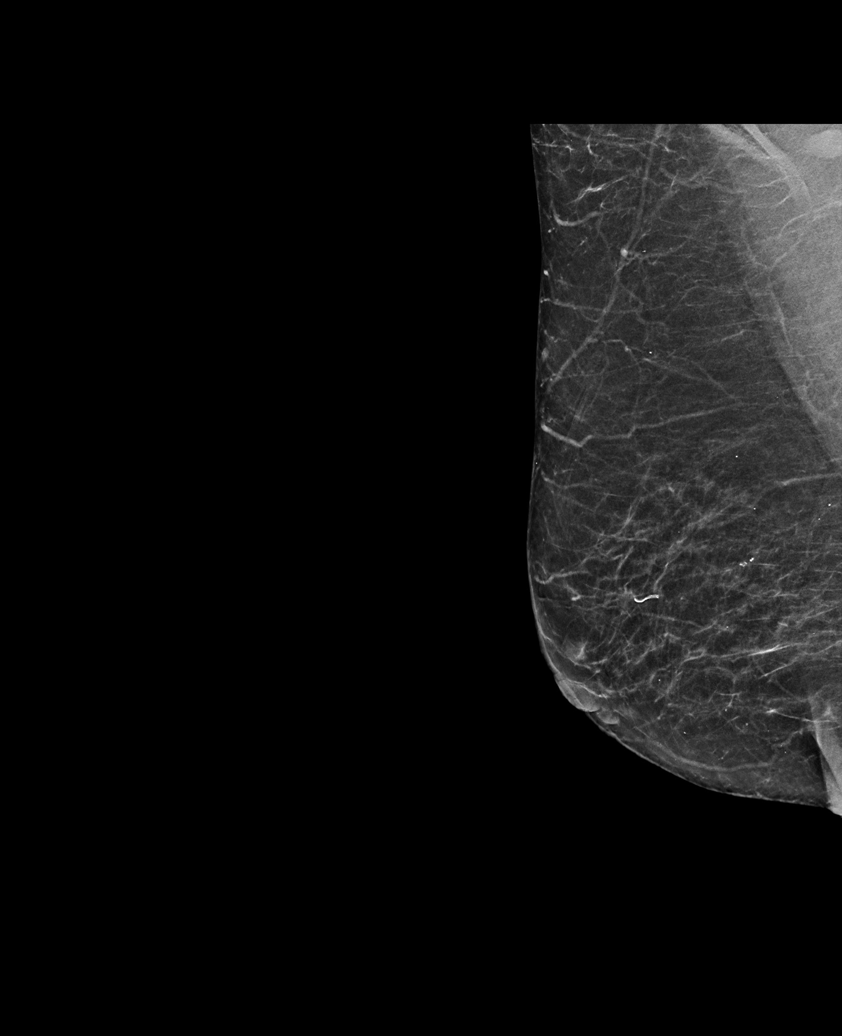

[L MLO synth-2D (2 of 2)]
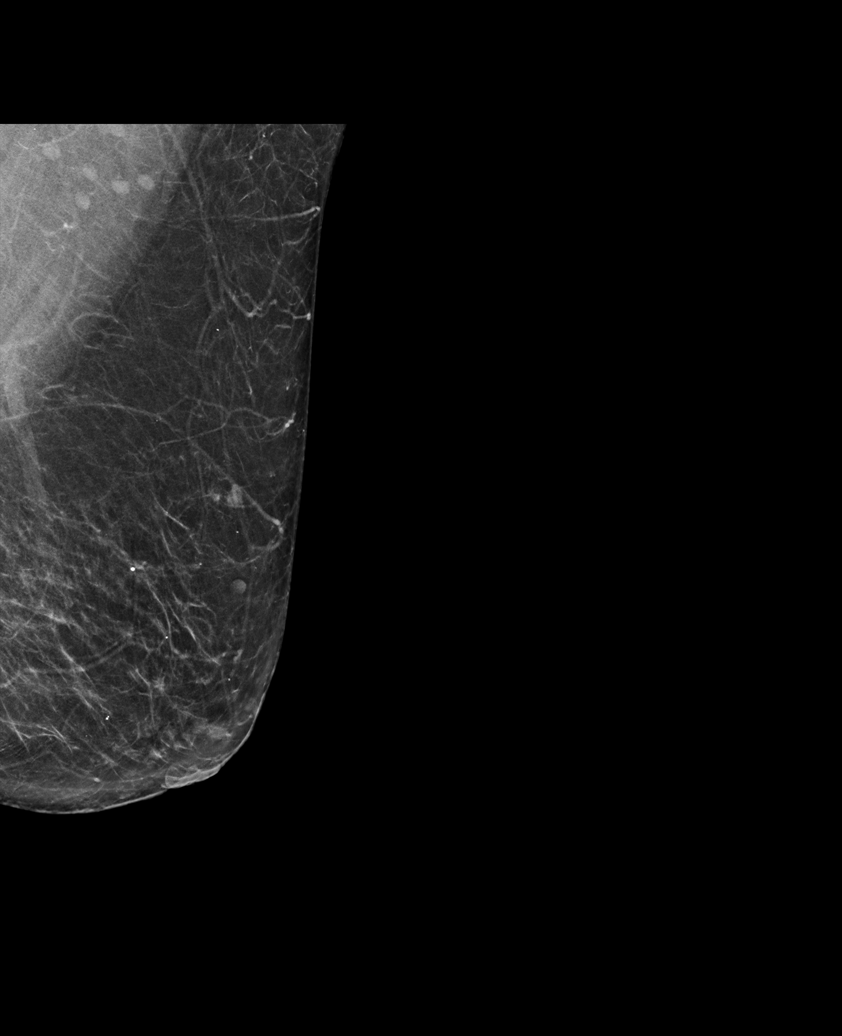

[R CC synth-2D]
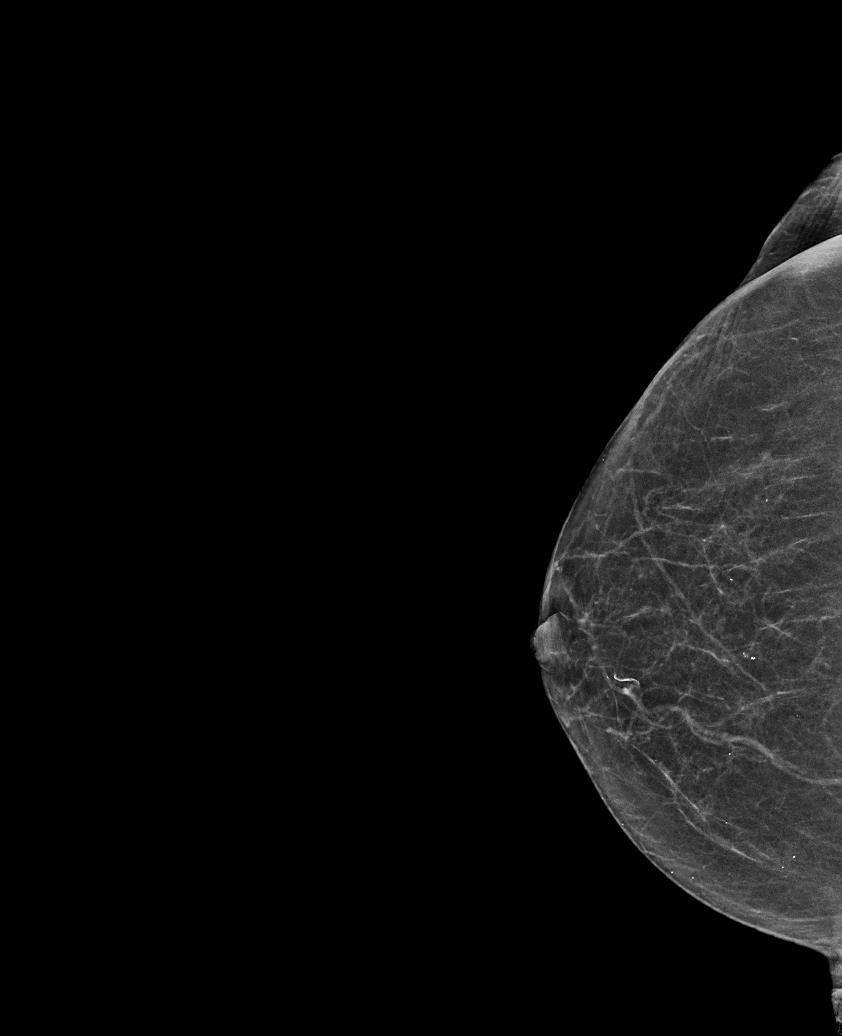

[L CC synth-2D]
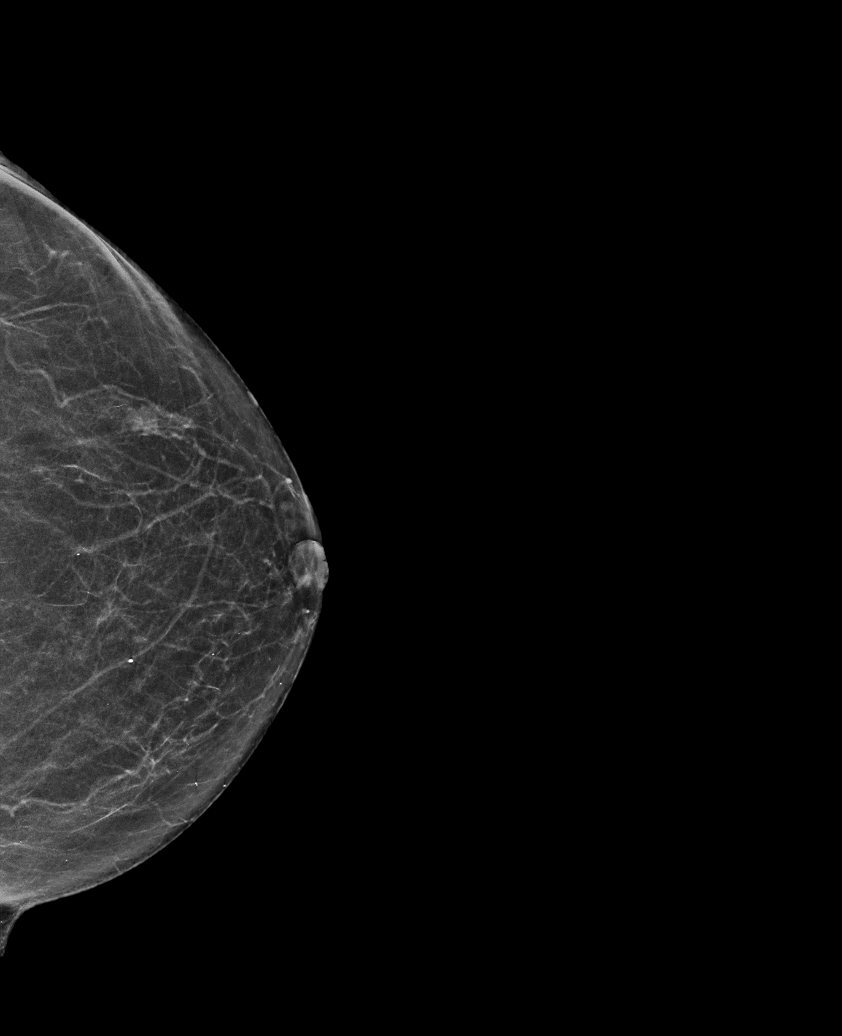

[R CC tomo · tomo slice 27/53.0]
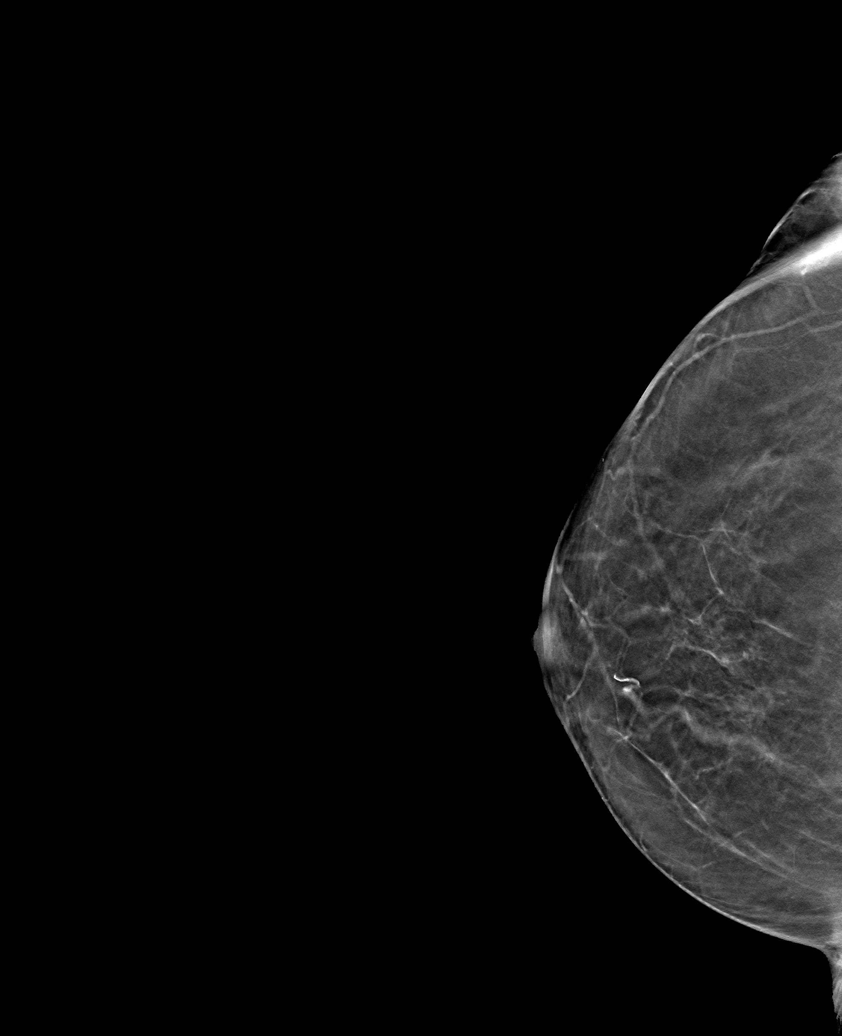

[6 of 30 positions shown; findings below may reference images not displayed]

ACR Breast Density Category b: There are scattered areas of
fibroglandular density.
FINDINGS: There are no findings suspicious for malignancy. Images were
processed with CAD.
IMPRESSION: No mammographic evidence of malignancy. A result letter of this
screening mammogram will be mailed directly to the patient.

RECOMMENDATION:
Screening mammogram in one year. (Code:[id number])

BI-RADS CATEGORY  1: Negative.

## 2019-10-18 ENCOUNTER — Other Ambulatory Visit: Payer: Self-pay | Admitting: Family Medicine

## 2019-10-18 DIAGNOSIS — Z1231 Encounter for screening mammogram for malignant neoplasm of breast: Secondary | ICD-10-CM

## 2019-10-26 ENCOUNTER — Ambulatory Visit
Admission: RE | Admit: 2019-10-26 | Discharge: 2019-10-26 | Disposition: A | Discharge status: Home / Self Care | Payer: Medicare Other | Source: Ambulatory Visit

## 2019-10-26 ENCOUNTER — Other Ambulatory Visit: Payer: Self-pay

## 2019-10-26 DIAGNOSIS — Z1231 Encounter for screening mammogram for malignant neoplasm of breast: Secondary | ICD-10-CM | POA: Diagnosis not present

## 2019-11-02 ENCOUNTER — Encounter: Payer: Self-pay | Admitting: Nurse Practitioner

## 2019-11-02 ENCOUNTER — Ambulatory Visit: Payer: Medicare Other | Admitting: Nurse Practitioner

## 2019-11-02 VITALS — BP 140/78 | HR 102 | Ht 65.5 in | Wt 176.0 lb

## 2019-11-02 DIAGNOSIS — K219 Gastro-esophageal reflux disease without esophagitis: Secondary | ICD-10-CM | POA: Diagnosis not present

## 2019-11-02 DIAGNOSIS — K449 Diaphragmatic hernia without obstruction or gangrene: Secondary | ICD-10-CM | POA: Diagnosis not present

## 2019-11-02 NOTE — Progress Notes (Signed)
11/02/2019 Sara [name] [id number] 20-Sep-1945   Chief Complaint: Food sits in stomach  History of Present Illness: Sara [name]. Moffet is a 74 year old female with a past medical history of hypertension, CKD stabe kidney stones, a hiatal hernia s/p Nissen Fundoplication in 2297, Barrett's esophagus 2011 and GERD.  S/P cholecystectomy in 2014. She was last seen in our office by Dr. Ardis Hughs on 12/26/2017 for further evaluation regarding loose stools. Stool studies were negative and her diarrhea resolved.  She underwent a colonoscopy 02/17/2018 which identified a 15 mm polyp to the cecum and a 3 mm polyp to the descending colon which were removed.  Biopsy showed tubular adenomatous and benign colonic mucosa fragments. Diverticulosis to the left colon was noted.  A repeat colonoscopy in 3 years was recommended. Aunt with history of colon cancer.  She presents today with complaints of feeling as if food sits to the bottom of her stomach shortly after eating.  A few hours later the food still sits in her stomach and she lays down and her symptoms improve.  No dysphagia.  She often has abdominal bloat several hours after eating.  She has occasional dry heaves without vomiting.  She has occasional nighttime reflux which occurs for 2 consecutive nights every 2 weeks.  She is taking Pantoprazole 40 mg in the morning.  She underwent upper GI series 08/24/2019 which showed food debris in the stomach with a small to moderate sliding hiatal hernia with marked gastroesophageal reflux.  Contrast flowed to the pylorus promptly there was no substantial peristalsis in the stomach or duodenum 12 minutes after the administration of the barium.  See detailed upper GI report below.  She underwent an EGD 04/14/2009 (prior to her Niesen fundoplication surgery) which showed a large hiatal hernia and Barrett's esophagus. She is passing a normal formed BM daily. No CP or palpitations. No cough or SOB. No NSAIDs.   Labs  06/02/2019: BUN 19.  Creatinine 1.25.  Alk phos 85.  AST 16.  ALT 11.  Total bili 0.7.  WBC 6.9.  Hemoglobin 15.2.   UGI series 08/24/2019: 1. Although the stomach was not markedly distended at the beginning of the study, the lumen was filled with food debris despite an appropriate period of fasting prior to this exam. As such, assessment of the gastric mucosa is markedly limited. 2. Small to moderate sliding type hiatal hernia with marked gastroesophageal reflux observed during the exam. When the fluoro table was in a horizontal position, food mixed with barium with seen freely flowing back and forth through the gastroesophageal junction. Episodes of reflux to the level of the sternal notch were observed with the patient in a horizontal position. When returning to an upright position, the food and barium drained back down through the patulous esophagogastric junction into the hiatal hernia/stomach. 3. Although early contrast flow through the pylorus was relatively prompt, no substantial peristalsis was observed in the stomach or duodenum and 12 minutes after administration of oral barium, contrast material had just barely passed the ligament of Treitz. Nuclear medicine gastric emptying study may prove helpful to further Evaluate. 4. 13 mm barium tablet passes readily into the stomach when taken with water.  EGD 04/14/2009: Very large hiatal hernia, majority of stomach is above diaphragm Long segment of nonnodular Barrett's appearing mucosa.   Biopsies confirmed Barrett's esophagus without dysplasia. Otherwise normal examination  Colonoscopy 02/17/2018:  - One 15 mm polyp in the cecum, removed using injection-lift and a hot snare. Resected and  retrieved. - One 3 mm polyp in the descending colon, removed with a cold snare. Resected and retrieved. - Diverticulosis in the left colon. - The examination was otherwise normal on direct and retroflexion views. Surgical [P], cecum,  descending, polyp (2) - TUBULAR ADENOMA (3 OF 6 FRAGMENTS) - BENIGN COLONIC MUCOSA (3 OF 6 FRAGMENTS) - NO HIGH GRADE DYSPLASIA OR MALIGNANCY IDENTIFIED  Current Outpatient Medications on File Prior to Visit  Medication Sig Dispense Refill  . amLODipine (NORVASC) 5 MG tablet TAKE 1 TABLET BY MOUTH EVERY DAY IN THE MORNING 90 tablet 3  . Calcium Carbonate-Vitamin D (CALCIUM-VITAMIN D) 500-200 MG-UNIT per tablet Take 1 tablet by mouth 2 (two) times daily with a meal.    . Cyanocobalamin (VITAMIN B-12 CR PO) Take 4,000 Units by mouth daily.     . magnesium citrate SOLN Take 1 Bottle by mouth once.    . Multiple Vitamin (MULTIVITAMIN WITH MINERALS) TABS Take 1 tablet by mouth daily.    . pantoprazole (PROTONIX) 40 MG tablet TAKE 1 TABLET BY MOUTH EVERY DAY 90 tablet 3  . Probiotic Product (PROBIOTIC DAILY PO) Take by mouth daily.    . rosuvastatin (CRESTOR) 5 MG tablet Take 1 tablet (5 mg total) by mouth daily. 90 tablet 3   No current facility-administered medications on file prior to visit.    Allergies  Allergen Reactions  . Augmentin [Amoxicillin-Pot Clavulanate] Diarrhea     Current Medications, Allergies, Past Medical History, Past Surgical History, Family History and Social History were reviewed in [employer] record.   Physical Exam: LMP  (LMP Unknown)   BP 140/78   Pulse (!) 102   Ht 5' 5.5" (1.664 m) Comment: with out shoes  Wt 176 lb (79.8 kg)   LMP  (LMP Unknown)   BMI 28.84 kg/m    Wt Readings from Last 3 Encounters:  11/02/19 176 lb (79.8 kg)  06/07/19 174 lb (78.9 kg)  06/02/19 171 lb 12.8 oz (77.9 kg)   General: Well developed 74 year old female in no acute distress. Head: Normocephalic and atraumatic. Eyes: No scleral icterus. Conjunctiva pink . Ears: Normal auditory acuity. Mouth: Poor dentition, many missing teeth. No loose teeth at this time.  No ulcers or lesions.  Lungs: Clear throughout to auscultation. Heart: Mildly  tachycardic, briefly irregular,  no murmur. Abdomen: Soft, nondistended. Mild epigastric tenderness without rebound or guarding. No masses or hepatomegaly. Normal bowel sounds x 4 quadrants.  Rectal: Deferred.  Musculoskeletal: Symmetrical with no gross deformities. Extremities: No edema. Neurological: Alert oriented x 4. No focal deficits.  Psychological: Alert and cooperative. Normal mood and affect  Assessment and Recommendations:  1.  GERD and Barrett's esophagus. S/P Nissen Fundoplication 4268. Abnormal UGI series 08/24/2019 showed evidence of a hiatal hernia,  reflux and gastroparesis. No obvious GOO. -EGD benefits and risks discussed including risk with sedation, risk of bleeding, perforation and infection  -Schedule a gastric empty study if EGD negative  -Continue pantoprazole 40 mg once daily -Pepcid 20 mg 1 p.o. nightly as needed -Patient to call our office if symptoms worsen -Further follow-up to be determined after EGD completed  2. History of colon polyps.  Family history (aunt)  of colon cancer. -Next colonoscopy due November 2022

## 2019-11-02 NOTE — Patient Instructions (Signed)
If you are age 72 or older, your body mass index should be between 23-30. Your Body mass index is 28.84 kg/m. If this is out of the aforementioned range listed, please consider follow up with your Primary Care Provider.  If you are age 9 or younger, your body mass index should be between 19-25. Your Body mass index is 28.84 kg/m. If this is out of the aformentioned range listed, please consider follow up with your Primary Care Provider.    Continue pantoprazole 40 mg daily  Pepcid 20mg  1 tablet at bedtime.- available over the counter  Call the office if your symptoms worsen  Please check your blood pressure at home for the next few days. If your heart rate remains above 100 or greater follow up with your PCP.  Due to recent changes in healthcare laws, you may see the results of your imaging and laboratory studies on MyChart before your provider has had a chance to review them.  We understand that in some cases there may be results that are confusing or concerning to you. Not all laboratory results come back in the same time frame and the provider may be waiting for multiple results in order to interpret others.  Please give [place] 48 hours in order for your provider to thoroughly review all the results before contacting the office for clarification of your results.

## 2019-11-09 NOTE — Progress Notes (Signed)
I agree with the above note, plan 

## 2019-12-01 ENCOUNTER — Other Ambulatory Visit: Payer: Self-pay

## 2019-12-01 ENCOUNTER — Encounter: Payer: Self-pay | Admitting: Gastroenterology

## 2019-12-01 ENCOUNTER — Encounter: Payer: Self-pay | Admitting: Family Medicine

## 2019-12-01 ENCOUNTER — Ambulatory Visit (INDEPENDENT_AMBULATORY_CARE_PROVIDER_SITE_OTHER): Payer: Medicare Other | Admitting: Family Medicine

## 2019-12-01 VITALS — BP 128/76 | HR 82 | Temp 97.7°F | Ht 65.5 in | Wt 171.6 lb

## 2019-12-01 DIAGNOSIS — N183 Chronic kidney disease, stage 3 unspecified: Secondary | ICD-10-CM

## 2019-12-01 DIAGNOSIS — I10 Essential (primary) hypertension: Secondary | ICD-10-CM

## 2019-12-01 NOTE — Patient Instructions (Signed)
-  blood pressure is great and heart rate is fine!   -routine labs today.  -get flu shot in October, just call and come in for nurse visit.   See you in 6 months, fasting labs at next appointment,   Dr. Rogers Blocker

## 2019-12-01 NOTE — Progress Notes (Signed)
Patient: Sara [name] MRN: [id number] DOB: 01/03/1946 PCP: Orma Flaming, MD     Subjective:  Chief Complaint  Patient presents with  . Hypertension  . Chronic Kidney Disease    HPI: The patient is a 73 y.o. female who presents today for Hypertension and CKD follow up. Pt concerns are a rapid heart rate.   Hypertension: Here for follow up of hypertension.  Currently on norvasc 5mg /day . Home readings range from 937-902 IOXBDZHG/99-24 diastolic. Takes medication as prescribed and denies any side effects. Exercise includes walking. Weight has been stable. Denies any chest pain, headaches, shortness of breath, vision changes, swelling in lower extremities.   Aorta arthrosclerosis and ascvd risk 20.5% On crestor 5mg /day.  Due for fasting lipid panel in 6 months.   UTD on HM/covid vaccines.   Review of Systems  Constitutional: Negative for chills, fatigue and fever.  HENT: Negative for dental problem, ear pain, hearing loss and trouble swallowing.   Eyes: Negative for visual disturbance.  Respiratory: Negative for cough, chest tightness and shortness of breath.   Cardiovascular: Negative for chest pain, palpitations and leg swelling.  Gastrointestinal: Negative for abdominal pain, blood in stool, diarrhea and nausea.  Endocrine: Negative for cold intolerance, polydipsia, polyphagia and polyuria.  Genitourinary: Negative for dysuria, frequency, hematuria, pelvic pain and urgency.  Musculoskeletal: Negative for arthralgias.  Skin: Negative for rash.  Neurological: Negative for dizziness and headaches.  Psychiatric/Behavioral: Negative for dysphoric mood and sleep disturbance. The patient is not nervous/anxious.     Allergies Patient is allergic to augmentin [amoxicillin-pot clavulanate].  Past Medical History Patient  has a past medical history of Barrett's esophagus, GERD (gastroesophageal reflux disease), HIATAL HERNIA, HYPERTENSION, and Kidney stone.  Surgical  History Patient  has a past surgical history that includes Tonsillectomy (1968); Cataract extraction (2009); Lithotripsy; Vaginal hysterectomy (N/A, 06/12/2012); Cystocele repair (N/A, 06/12/2012); Hiatal hernia repair (2011); Cholecystectomy (11/02/2012); Upper gi endoscopy (11/02/2012); and Upper gastrointestinal endoscopy.  Family History Pateint's family history includes Breast cancer in her maternal grandmother and another family member; Breast cancer (age of onset: 24) in her mother; Colon cancer in her paternal aunt; Heart disease in an other family member; Hypertension in an other family member.  Social History Patient  reports that she has never smoked. She has never used smokeless tobacco. She reports that she does not drink alcohol and does not use drugs.    Objective: Vitals:   12/01/19 0915  BP: 128/76  Pulse: 82  Temp: 97.7 F (36.5 C)  TempSrc: Temporal  SpO2: 98%  Weight: 171 lb 9.6 oz (77.8 kg)  Height: 5' 5.5" (1.664 m)    Body mass index is 28.12 kg/m.  Physical Exam Vitals reviewed.  Constitutional:      Appearance: Normal appearance.  HENT:     Head: Normocephalic and atraumatic.  Neck:     Vascular: No carotid bruit.  Cardiovascular:     Rate and Rhythm: Normal rate and regular rhythm.     Heart sounds: Normal heart sounds.  Pulmonary:     Effort: Pulmonary effort is normal.     Breath sounds: Normal breath sounds.  Abdominal:     General: Bowel sounds are normal.     Palpations: Abdomen is soft.  Musculoskeletal:     Cervical back: Normal range of motion and neck supple.  Skin:    Capillary Refill: Capillary refill takes less than 2 seconds.  Neurological:     General: No focal deficit present.     Mental  Status: She is alert and oriented to person, place, and time.  Psychiatric:        Mood and Affect: Mood normal.        Behavior: Behavior normal.        Assessment/plan Blood pressure is to goal. Continue current anti-hypertensive  medications. Refills not given and routine lab work will be done today. Recommended routine exercise and healthy diet including DASH diet and mediterranean diet. Encouraged weight loss. F/u in 6 months.   - CBC with Differential/Platelet; Future - COMPLETE METABOLIC PANEL WITH GFR; Future  2. Stage 3 chronic kidney disease, unspecified whether stage 3a or 3b CKD  - COMPLETE METABOLIC PANEL WITH GFR; Future  -heart rate normal at home and today. One outlaying reading when patient nervous reassured her im not concerned.     This visit occurred during the SARS-CoV-2 public health emergency.  Safety protocols were in place, including screening questions prior to the visit, additional usage of staff PPE, and extensive cleaning of exam room while observing appropriate contact time as indicated for disinfecting solutions.    Return in about 6 months (around 05/30/2020) for htn/fasting labs .   Orma Flaming, MD [place]   12/01/2019

## 2019-12-02 LAB — CBC WITH DIFFERENTIAL/PLATELET
Absolute Monocytes: 845 cells/uL (ref 200–950)
Basophils Absolute: 38 cells/uL (ref 0–200)
Basophils Relative: 0.4 %
Eosinophils Absolute: 250 cells/uL (ref 15–500)
Eosinophils Relative: 2.6 %
HCT: 48.5 % — ABNORMAL HIGH (ref 35.0–45.0)
Hemoglobin: 16 g/dL — ABNORMAL HIGH (ref 11.7–15.5)
Lymphs Abs: 1613 cells/uL (ref 850–3900)
MCH: 30.5 pg (ref 27.0–33.0)
MCHC: 33 g/dL (ref 32.0–36.0)
MCV: 92.4 fL (ref 80.0–100.0)
MPV: 10.4 fL (ref 7.5–12.5)
Monocytes Relative: 8.8 %
Neutro Abs: 6854 cells/uL (ref 1500–7800)
Neutrophils Relative %: 71.4 %
Platelets: 270 10*3/uL (ref 140–400)
RBC: 5.25 10*6/uL — ABNORMAL HIGH (ref 3.80–5.10)
RDW: 12.5 % (ref 11.0–15.0)
Total Lymphocyte: 16.8 %
WBC: 9.6 10*3/uL (ref 3.8–10.8)

## 2019-12-02 LAB — COMPLETE METABOLIC PANEL WITH GFR
AG Ratio: 1.7 (calc) (ref 1.0–2.5)
ALT: 10 U/L (ref 6–29)
AST: 15 U/L (ref 10–35)
Albumin: 4.4 g/dL (ref 3.6–5.1)
Alkaline phosphatase (APISO): 85 U/L (ref 37–153)
BUN/Creatinine Ratio: 18 (calc) (ref 6–22)
BUN: 23 mg/dL (ref 7–25)
CO2: 28 mmol/L (ref 20–32)
Calcium: 10.2 mg/dL (ref 8.6–10.4)
Chloride: 101 mmol/L (ref 98–110)
Creat: 1.3 mg/dL — ABNORMAL HIGH (ref 0.60–0.93)
GFR, Est African American: 47 mL/min/{1.73_m2} — ABNORMAL LOW (ref >=60)
GFR, Est Non African American: 41 mL/min/{1.73_m2} — ABNORMAL LOW (ref >=60)
Globulin: 2.6 g/dL (calc) (ref 1.9–3.7)
Glucose, Bld: 96 mg/dL (ref 65–99)
Potassium: 4.5 mmol/L (ref 3.5–5.3)
Sodium: 138 mmol/L (ref 135–146)
Total Bilirubin: 0.6 mg/dL (ref 0.2–1.2)
Total Protein: 7 g/dL (ref 6.1–8.1)

## 2019-12-10 ENCOUNTER — Encounter: Payer: Self-pay | Admitting: Gastroenterology

## 2019-12-10 ENCOUNTER — Other Ambulatory Visit: Payer: Self-pay

## 2019-12-10 ENCOUNTER — Ambulatory Visit (AMBULATORY_SURGERY_CENTER): Payer: Medicare Other | Admitting: Gastroenterology

## 2019-12-10 VITALS — BP 104/68 | HR 100 | Temp 99.1°F | Resp 15 | Ht 65.5 in | Wt 176.0 lb

## 2019-12-10 DIAGNOSIS — K2900 Acute gastritis without bleeding: Secondary | ICD-10-CM

## 2019-12-10 DIAGNOSIS — K219 Gastro-esophageal reflux disease without esophagitis: Secondary | ICD-10-CM

## 2019-12-10 DIAGNOSIS — K2970 Gastritis, unspecified, without bleeding: Secondary | ICD-10-CM

## 2019-12-10 DIAGNOSIS — K2270 Barrett's esophagus without dysplasia: Secondary | ICD-10-CM

## 2019-12-10 DIAGNOSIS — K449 Diaphragmatic hernia without obstruction or gangrene: Secondary | ICD-10-CM

## 2019-12-10 DIAGNOSIS — K2951 Unspecified chronic gastritis with bleeding: Secondary | ICD-10-CM | POA: Diagnosis not present

## 2019-12-10 MED ORDER — SODIUM CHLORIDE 0.9 % IV SOLN: 500.0000 mL | Freq: Once | INTRAVENOUS | Status: DC

## 2019-12-10 MED ORDER — PANTOPRAZOLE SODIUM 40 MG PO TBEC: 40.0000 mg | DELAYED_RELEASE_TABLET | Freq: Two times a day (BID) | ORAL | 11 refills | Status: DC

## 2019-12-10 NOTE — Op Note (Signed)
[facility] Patient Name: Sara [name] Procedure Date: 12/10/2019 3:35 PM MRN: [id number] Endoscopist: Milus Banister , MD Age: 74 Referring MD:  Date of Birth: 13-Apr-1945 Gender: Female Account #: [id number] Procedure:                Upper GI endoscopy Indications:              Indigestion, bloating about 1 hour after eating;                            remote fundoplication for large HH, recent abnormal                            UGI; EGD 2011 showed long segment of non-dysplastic                            Barrett's Medicines:                Monitored Anesthesia Care Procedure:                Pre-Anesthesia Assessment:                           - Prior to the procedure, a History and Physical                            was performed, and patient medications and                            allergies were reviewed. The patient's tolerance of                            previous anesthesia was also reviewed. The risks                            and benefits of the procedure and the sedation                            options and risks were discussed with the patient.                            All questions were answered, and informed consent                            was obtained. Prior Anticoagulants: The patient has                            taken no previous anticoagulant or antiplatelet                            agents. ASA Grade Assessment: II - A patient with                            mild systemic disease. After reviewing the risks  and benefits, the patient was deemed in                            satisfactory condition to undergo the procedure.                           After obtaining informed consent, the endoscope was                            passed under direct vision. Throughout the                            procedure, the patient's blood pressure, pulse, and                            oxygen saturations were monitored  continuously. The                            Endoscope was introduced through the mouth, and                            advanced to the second part of duodenum. The upper                            GI endoscopy was accomplished without difficulty.                            The patient tolerated the procedure well. Scope In: Scope Out: Findings:                 Significant anatomically abnormal proximal stomach.                            It appears that the fundoplication is intact                            however it is above the diaphragm in a medium sized                            haital hernia.                           Free reflux of gastric secrection into the                            esophagus.                           No retained solid or liquid food in the stomach.                           Non-specific mild distal gastritis, biopsied to                            check for H. pylori. jar 1  The esophagus was tortuous and forshortned due to                            the recurrent medium sized hiatal hernia.                           GE junction is located at 34cm from the incisors.                            Just proximal to the GE junction there is a single                            erosion, the mucosa was biopsied extensively. jar 2.                           Barrett's appearing mucosa from the GE junction up                            to 25cm from the incisors (9cm long segment of                            Barrett's appearing mucosa). The Barret's changes                            were sampled with 4 quadrant biopsies at 33cm,                            31cm, 29cm, 27cm and then 25cm.                           Mild reflux related esophagitis at the                            proximal/leading edge of the Barrett's change.                           The exam was otherwise without abnormality. Complications:            No immediate  complications. Estimated blood loss:                            None. Estimated Blood Loss:     Estimated blood loss: none. Impression:               Significant anatomically abnormal proximal stomach.                            It appears that the fundoplication is intact                            however it is above the diaphragm in a medium sized                            haital hernia.  Free reflux of gastric secrection into the                            esophagus.                           No retained solid or liquid food in the stomach.                           Non-specific mild distal gastritis, biopsied to                            check for H. pylori. jar 1                           The esophagus was tortuous and forshortned due to                            the recurrent medium sized hiatal hernia.                           GE junction is located at 34cm from the incisors.                            Just proximal to the GE junction there is a single                            erosion, the mucosa was biopsied extensively. jar 2.                           Barrett's appearing mucosa from the GE junction up                            to 25cm from the incisors (9cm long segment of                            Barrett's appearing mucosa). The Barret's changes                            were sampled with 4 quadrant biopsies at 33cm,                            31cm, 29cm, 27cm and then 25cm.                           Mild reflux related esophagitis at the                            proximal/leading edge of the Barrett's change.                           I am suspicious that the recurrent medium sized  hiatal hernia containing the remote fundoplication                            is causing much of her symptoms. Lack of retained                            food in the stomach argues against gastroparesis. Recommendation:           -  Patient has a contact number available for                            emergencies. The signs and symptoms of potential                            delayed complications were discussed with the                            patient. Return to normal activities tomorrow.                            Written discharge instructions were provided to the                            patient.                           - Resume previous diet.                           - Continue present medications. New prescription                            for increased dosing of pantoprazole written today;                            pantoprazole 40mg  pills one pill 20-30 min before                            breakfast and dinner meals, disp 60 with 11                            refills. Also please take pepcid 20mg  pill, one                            pill at bedtime every night.                           - Await pathology results. Milus Banister, MD 12/10/2019 4:16:44 PM This report has been signed electronically.

## 2019-12-10 NOTE — Progress Notes (Signed)
Robinul 0.1 mg IV given due large amount of secretions upon assessment.  MD made aware, vss 

## 2019-12-10 NOTE — Patient Instructions (Signed)
Handouts provided on Gastritis, Hiatal hernia and Barrett's Esophagus.  Continue present medications. New prescription for increased dosing of Pantoprazole written today; Pantoprazole 40mg  pills, one pill 20-30 minutes before breakfast and dinner meals. Also please take Pepcid (famotadine) 20mg  pill, one pill at bedtime every night (this is an over-the-counter) medication.   YOU HAD AN ENDOSCOPIC PROCEDURE TODAY AT [facility]:   Refer to the procedure report that was given to you for any specific questions about what was found during the examination.  If the procedure report does not answer your questions, please call your gastroenterologist to clarify.  If you requested that your care partner not be given the details of your procedure findings, then the procedure report has been included in a sealed envelope for you to review at your convenience later.  YOU SHOULD EXPECT: Some feelings of bloating in the abdomen. Passage of more gas than usual.  Walking can help get rid of the air that was put into your GI tract during the procedure and reduce the bloating. If you had a lower endoscopy (such as a colonoscopy or flexible sigmoidoscopy) you may notice spotting of blood in your stool or on the toilet paper. If you underwent a bowel prep for your procedure, you may not have a normal bowel movement for a few days.  Please Note:  You might notice some irritation and congestion in your nose or some drainage.  This is from the oxygen used during your procedure.  There is no need for concern and it should clear up in a day or so.  SYMPTOMS TO REPORT IMMEDIATELY:   Following upper endoscopy (EGD)  Vomiting of blood or coffee ground material  New chest pain or pain under the shoulder blades  Painful or persistently difficult swallowing  New shortness of breath  Fever of 100F or higher  Black, tarry-looking stools  For urgent or emergent issues, a gastroenterologist can be reached at any  hour by calling [phone]. Do not use MyChart messaging for urgent concerns.    DIET:  We do recommend a small meal at first, but then you may proceed to your regular diet.  Drink plenty of fluids but you should avoid alcoholic beverages for 24 hours.  ACTIVITY:  You should plan to take it easy for the rest of today and you should NOT DRIVE or use heavy machinery until tomorrow (because of the sedation medicines used during the test).    FOLLOW UP: Our staff will call the number listed on your records 48-72 hours following your procedure to check on you and address any questions or concerns that you may have regarding the information given to you following your procedure. If we do not reach you, we will leave a message.  We will attempt to reach you two times.  During this call, we will ask if you have developed any symptoms of COVID 19. If you develop any symptoms (ie: fever, flu-like symptoms, shortness of breath, cough etc.) before then, please call [phone].  If you test positive for Covid 19 in the 2 weeks post procedure, please call and report this information to [place].    If any biopsies were taken you will be contacted by phone or by letter within the next 1-3 weeks.  Please call us at [phone] if you have not heard about the biopsies in 3 weeks.    SIGNATURES/CONFIDENTIALITY: You and/or your care partner have signed paperwork which will be entered into your electronic medical  record.  These signatures attest to the fact that that the information above on your After Visit Summary has been reviewed and is understood.  Full responsibility of the confidentiality of this discharge information lies with you and/or your care-partner.

## 2019-12-10 NOTE — Progress Notes (Signed)
Called to room to assist during endoscopic procedure.  Patient ID and intended procedure confirmed with present staff. Received instructions for my participation in the procedure from the performing physician.  

## 2019-12-10 NOTE — Progress Notes (Signed)
Report given to PACU, vss 

## 2019-12-14 ENCOUNTER — Telehealth: Payer: Self-pay | Admitting: *Deleted

## 2019-12-14 NOTE — Telephone Encounter (Signed)
  Follow up Call-  Call back number 12/10/2019 02/17/2018  Post procedure Call Back phone  # [phone]  Permission to leave phone message Yes Yes  Some recent data might be hidden     Patient questions:  Do you have a fever, pain , or abdominal swelling? No. Pain Score  0 *  Have you tolerated food without any problems? Yes.    Have you been able to return to your normal activities? Yes.    Do you have any questions about your discharge instructions: Diet   No. Medications  No. Follow up visit  No.  Do you have questions or concerns about your Care? Yes.     Pt reports that she has diminished kidney function and asked if it is ok for her to take the Pepcid 20mg  at bedtime that you recommended.  She read that it is not recommended in patients with kidney problems.  Dr. Ardis Hughs please advise.    Actions: * If pain score is 4 or above: No action needed, pain <4.  1. Have you developed a fever since your procedure? no  2.   Have you had an respiratory symptoms (SOB or cough) since your procedure? no  3.   Have you tested positive for COVID 19 since your procedure no  4.   Have you had any family members/close contacts diagnosed with the COVID 19 since your procedure?  no   If yes to any of these questions please route to Joylene John, RN and Joella Prince, RN

## 2019-12-16 NOTE — Telephone Encounter (Signed)
I signed this phone note before writing anything on it. Sorry.   Sara [name], Can you let her know that pepcid at bedtime should be fine with her kidneys. Certainly if she still has concerns she should talk with her nephrologist as well.  Thanks

## 2019-12-16 NOTE — Telephone Encounter (Signed)
The pt has been advised and will try Pepcid at bedtime

## 2019-12-17 ENCOUNTER — Encounter: Payer: Self-pay | Admitting: Gastroenterology

## 2020-01-12 ENCOUNTER — Other Ambulatory Visit: Payer: Self-pay

## 2020-01-12 ENCOUNTER — Ambulatory Visit (INDEPENDENT_AMBULATORY_CARE_PROVIDER_SITE_OTHER): Payer: Medicare Other

## 2020-01-12 ENCOUNTER — Encounter: Payer: Self-pay | Admitting: Family Medicine

## 2020-01-12 DIAGNOSIS — Z23 Encounter for immunization: Secondary | ICD-10-CM

## 2020-02-11 ENCOUNTER — Ambulatory Visit: Payer: Medicare Other | Admitting: Gastroenterology

## 2020-02-11 ENCOUNTER — Encounter: Payer: Self-pay | Admitting: Gastroenterology

## 2020-02-11 VITALS — BP 110/80 | HR 93 | Ht 65.5 in | Wt 169.0 lb

## 2020-02-11 DIAGNOSIS — K219 Gastro-esophageal reflux disease without esophagitis: Secondary | ICD-10-CM

## 2020-02-11 DIAGNOSIS — K449 Diaphragmatic hernia without obstruction or gangrene: Secondary | ICD-10-CM | POA: Diagnosis not present

## 2020-02-11 NOTE — Progress Notes (Signed)
Review of pertinent gastrointestinal problems: 1.  History of precancerous colon polyps: colonoscopy 02/17/2018 which identified a 15 mm polyp to the cecum and a 3 mm polyp to the descending colon which were removed.  Biopsy showed tubular adenomatous and benign colonic mucosa fragments 2.  Fundoplication 3016 for a large hiatal hernia.  Hiatal hernia recurred as evident on EGD September 2021.  Significantly anatomically abnormal stomach.  Fundoplication appeared intact but had herniated above the diaphragm. 3.  Barrett's esophagus, long segment 9 cm long.  EGD September 2021 with multiple segmental biopsies showed intestinal metaplasia without dysplasia.  Recommend repeat EGD at 3-year interval   HPI: This is a very pleasant 74 year old woman  Following her upper endoscopy 2 months ago I changed her antiacid therapies around.  She was going to be taking Protonix 40 mg twice daily before breakfast and dinner as well as Pepcid 20 mg at bedtime every night.  She admits she has been only taking the Pepcid on a as needed basis but she is very good about taking her Protonix twice daily.  On this regimen she feels pretty well.  She still has clear postprandial discomforts at times.  Bloating, feeling of food just sitting heavy in her upper abdomen lower chest.  Her weight is overall stable   ROS: complete GI ROS as described in HPI, all other review negative.  Constitutional:  No unintentional weight loss   Past Medical History:  Diagnosis Date  . Barrett's esophagus   . Cataract    had surgery  . GERD (gastroesophageal reflux disease)    hx of   . HIATAL HERNIA    s/p surgical repair  . Hyperlipidemia   . HYPERTENSION   . Kidney stone    1990s  . Osteoporosis     Past Surgical History:  Procedure Laterality Date  . CATARACT EXTRACTION  2009   both eyes  . CHOLECYSTECTOMY  11/02/2012   Procedure: CHOLECYSTECTOMY;  Surgeon: Pedro Earls, MD;  Location: [facility];  Service:  General;;  . Lester Tuckerman REPAIR N/A 06/12/2012   Procedure: ANTERIOR REPAIR (CYSTOCELE);  Surgeon: Elveria Royals, MD;  Location: [facility];  Service: Gynecology;  Laterality: N/A;  . HIATAL HERNIA REPAIR  2011  . LITHOTRIPSY    . TONSILLECTOMY  1968  . UPPER GASTROINTESTINAL ENDOSCOPY    . UPPER GI ENDOSCOPY  11/02/2012   Procedure: UPPER GI ENDOSCOPY;  Surgeon: Pedro Earls, MD;  Location: [facility];  Service: General;;  . VAGINAL HYSTERECTOMY N/A 06/12/2012   Procedure: HYSTERECTOMY VAGINAL;  Surgeon: Elveria Royals, MD;  Location: [facility];  Service: Gynecology;  Laterality: N/A;    Current Outpatient Medications  Medication Sig Dispense Refill  . amLODipine (NORVASC) 5 MG tablet TAKE 1 TABLET BY MOUTH EVERY DAY IN THE MORNING 90 tablet 3  . Calcium Carbonate-Vitamin D (CALCIUM-VITAMIN D) 500-200 MG-UNIT per tablet Take 1 tablet by mouth 2 (two) times daily with a meal.    . Cyanocobalamin (VITAMIN B-12 CR PO) Take 4,000 Units by mouth daily.     . Multiple Vitamin (MULTIVITAMIN WITH MINERALS) TABS Take 1 tablet by mouth daily.    . pantoprazole (PROTONIX) 40 MG tablet Take 1 tablet (40 mg total) by mouth 2 (two) times daily before a meal. Take 20-30 minutes before breakfast and dinner meals. 60 tablet 11  . Probiotic Product (PROBIOTIC DAILY PO) Take by mouth daily.    . rosuvastatin (CRESTOR) 5 MG tablet Take 1 tablet (5 mg total)  by mouth daily. 90 tablet 3   No current facility-administered medications for this visit.    Allergies as of 02/11/2020 - Review Complete 02/11/2020  Allergen Reaction Noted  . Augmentin [amoxicillin-pot clavulanate] Diarrhea 01/29/2018    Family History  Problem Relation Age of Onset  . Breast cancer Mother 52       with mets   . Hypertension Other        Parent  . Breast cancer Other   . Heart disease Other        parent, grandparent  . Colon cancer Paternal Aunt   . Breast cancer Maternal Grandmother   . Esophageal cancer Neg Hx   . Rectal  cancer Neg Hx   . Stomach cancer Neg Hx   . Colon polyps Neg Hx     Social History   Socioeconomic History  . Marital status: Widowed    Spouse name: Not on file  . Number of children: 5  . Years of education: 59  . Highest education level: Not on file  Occupational History  . Occupation: Retired  Tobacco Use  . Smoking status: Never Smoker  . Smokeless tobacco: Never Used  Vaping Use  . Vaping Use: Never used  Substance and Sexual Activity  . Alcohol use: No  . Drug use: No  . Sexual activity: Yes    Birth control/protection: Post-menopausal  Other Topics Concern  . Not on file  Social History Narrative   widowed since late 2011 (spouse had mod-adv dementia) 5 grown children, 3 nearby-,many g-kids   Social Determinants of Health   Financial Resource Strain:   . Difficulty of Paying Living Expenses: Not on file  Food Insecurity:   . Worried About Charity fundraiser in the Last Year: Not on file  . Ran Out of Food in the Last Year: Not on file  Transportation Needs:   . Lack of Transportation (Medical): Not on file  . Lack of Transportation (Non-Medical): Not on file  Physical Activity:   . Days of Exercise per Week: Not on file  . Minutes of Exercise per Session: Not on file  Stress:   . Feeling of Stress : Not on file  Social Connections:   . Frequency of Communication with Friends and Family: Not on file  . Frequency of Social Gatherings with Friends and Family: Not on file  . Attends Religious Services: Not on file  . Active Member of Clubs or Organizations: Not on file  . Attends Archivist Meetings: Not on file  . Marital Status: Not on file  Intimate Partner Violence:   . Fear of Current or Ex-Partner: Not on file  . Emotionally Abused: Not on file  . Physically Abused: Not on file  . Sexually Abused: Not on file     Physical Exam: BP 110/80   Pulse 93   Ht 5' 5.5" (1.664 m)   Wt 169 lb (76.7 kg)   LMP  (LMP Unknown)   SpO2 95%   BMI  27.70 kg/m  Constitutional: generally well-appearing Psychiatric: alert and oriented x3 Abdomen: soft, nontender, nondistended, no obvious ascites, no peritoneal signs, normal bowel sounds No peripheral edema noted in lower extremities  Assessment and plan: 74 y.o. female with current hiatal hernia, status post 3220 fundoplication, Barrett's esophagus, GERD  I think she is pretty clearly having difficulties with her recurrent hiatal hernia.  The fundoplication appears intact however it is herniated above the diaphragm.  Her symptoms are improved while  taking proton pump inhibitor twice daily but not gone.  She understands that surgery may help the remainder of her symptoms but she is really not very interested in having another surgery.  I think that is perfectly reasonable.  She understands that if her symptoms worsen then that idea could be revisited at any time.  She will continue taking proton pump inhibitor twice daily.  She will return to see me on an as-needed basis.  Barrett's follow-up in 3 years by EGD.  Please see the "Patient Instructions" section for addition details about the plan.  Owens Loffler, MD [facility] 02/11/2020, 3:18 PM   Total time on date of encounter was 30 minutes (this included time spent preparing to see the patient reviewing records; obtaining and/or reviewing separately obtained history; performing a medically appropriate exam and/or evaluation; counseling and educating the patient and family if present; ordering medications, tests or procedures if applicable; and documenting clinical information in the health record).

## 2020-02-11 NOTE — Patient Instructions (Signed)
If you are age 72 or older, your body mass index should be between 23-30. Your Body mass index is 27.7 kg/m. If this is out of the aforementioned range listed, please consider follow up with your Primary Care Provider.  If you are age 71 or younger, your body mass index should be between 19-25. Your Body mass index is 27.7 kg/m. If this is out of the aformentioned range listed, please consider follow up with your Primary Care Provider.   Please continue pantoprazole twice daily.  Please take pepcid on an as needed basis.  Please call our office with any future concerns.  Thank you for entrusting me with your care and choosing [facility].  Dr Ardis Hughs

## 2020-03-22 DIAGNOSIS — Z1159 Encounter for screening for other viral diseases: Secondary | ICD-10-CM | POA: Diagnosis not present

## 2020-04-06 ENCOUNTER — Other Ambulatory Visit: Payer: Medicare Other

## 2020-05-20 ENCOUNTER — Other Ambulatory Visit: Payer: Self-pay | Admitting: Family Medicine

## 2020-05-31 ENCOUNTER — Ambulatory Visit (INDEPENDENT_AMBULATORY_CARE_PROVIDER_SITE_OTHER): Payer: Medicare Other | Admitting: Family Medicine

## 2020-05-31 ENCOUNTER — Encounter: Payer: Self-pay | Admitting: Family Medicine

## 2020-05-31 ENCOUNTER — Other Ambulatory Visit: Payer: Self-pay

## 2020-05-31 VITALS — BP 112/60 | HR 84 | Temp 98.0°F | Ht 65.5 in | Wt 166.0 lb

## 2020-05-31 DIAGNOSIS — N183 Chronic kidney disease, stage 3 unspecified: Secondary | ICD-10-CM | POA: Diagnosis not present

## 2020-05-31 DIAGNOSIS — I700 Atherosclerosis of aorta: Secondary | ICD-10-CM

## 2020-05-31 DIAGNOSIS — R8290 Unspecified abnormal findings in urine: Secondary | ICD-10-CM

## 2020-05-31 DIAGNOSIS — I10 Essential (primary) hypertension: Secondary | ICD-10-CM | POA: Diagnosis not present

## 2020-05-31 DIAGNOSIS — K2270 Barrett's esophagus without dysplasia: Secondary | ICD-10-CM | POA: Insufficient documentation

## 2020-05-31 LAB — LIPID PANEL
Cholesterol: 126 mg/dL (ref 0–200)
HDL: 53.9 mg/dL (ref >39.00)
LDL Cholesterol: 53 mg/dL (ref 0–99)
NonHDL: 72.31
Total CHOL/HDL Ratio: 2
Triglycerides: 98 mg/dL (ref 0.0–149.0)
VLDL: 19.6 mg/dL (ref 0.0–40.0)

## 2020-05-31 LAB — POCT URINALYSIS DIPSTICK
Bilirubin, UA: NEGATIVE
Blood, UA: NEGATIVE
Glucose, UA: NEGATIVE
Ketones, UA: NEGATIVE
Nitrite, UA: NEGATIVE
Protein, UA: NEGATIVE
Spec Grav, UA: 1.015 (ref 1.010–1.025)
Urobilinogen, UA: 0.2 E.U./dL
pH, UA: 6 (ref 5.0–8.0)

## 2020-05-31 LAB — MICROALBUMIN / CREATININE URINE RATIO
Creatinine,U: 56.2 mg/dL
Microalb Creat Ratio: 1.2 mg/g (ref 0.0–30.0)
Microalb, Ur: <0.7 mg/dL (ref 0.0–1.9)

## 2020-05-31 LAB — CBC WITH DIFFERENTIAL/PLATELET
Basophils Absolute: 0 10*3/uL (ref 0.0–0.1)
Basophils Relative: 0.5 % (ref 0.0–3.0)
Eosinophils Absolute: 0.2 10*3/uL (ref 0.0–0.7)
Eosinophils Relative: 2.8 % (ref 0.0–5.0)
HCT: 45 % (ref 36.0–46.0)
Hemoglobin: 15.2 g/dL — ABNORMAL HIGH (ref 12.0–15.0)
Lymphocytes Relative: 17.5 % (ref 12.0–46.0)
Lymphs Abs: 1.5 10*3/uL (ref 0.7–4.0)
MCHC: 33.7 g/dL (ref 30.0–36.0)
MCV: 90.9 fl (ref 78.0–100.0)
Monocytes Absolute: 0.8 10*3/uL (ref 0.1–1.0)
Monocytes Relative: 9 % (ref 3.0–12.0)
Neutro Abs: 6 10*3/uL (ref 1.4–7.7)
Neutrophils Relative %: 70.2 % (ref 43.0–77.0)
Platelets: 252 10*3/uL (ref 150.0–400.0)
RBC: 4.95 Mil/uL (ref 3.87–5.11)
RDW: 14 % (ref 11.5–15.5)
WBC: 8.6 10*3/uL (ref 4.0–10.5)

## 2020-05-31 LAB — COMPREHENSIVE METABOLIC PANEL
ALT: 11 U/L (ref 0–35)
AST: 16 U/L (ref 0–37)
Albumin: 4.3 g/dL (ref 3.5–5.2)
Alkaline Phosphatase: 85 U/L (ref 39–117)
BUN: 20 mg/dL (ref 6–23)
CO2: 27 mEq/L (ref 19–32)
Calcium: 10.2 mg/dL (ref 8.4–10.5)
Chloride: 102 mEq/L (ref 96–112)
Creatinine, Ser: 1.13 mg/dL (ref 0.40–1.20)
GFR: 48.49 mL/min — ABNORMAL LOW (ref >60.00)
Glucose, Bld: 100 mg/dL — ABNORMAL HIGH (ref 70–99)
Potassium: 4.3 mEq/L (ref 3.5–5.1)
Sodium: 138 mEq/L (ref 135–145)
Total Bilirubin: 0.7 mg/dL (ref 0.2–1.2)
Total Protein: 7.1 g/dL (ref 6.0–8.3)

## 2020-05-31 NOTE — Progress Notes (Signed)
Patient: Sara [name] MRN: [id number] DOB: 1945-09-13 PCP: Orma Flaming, MD     Subjective:  Chief Complaint  Patient presents with  . Hypertension  . Urinary Tract Infection    Pt thinks that she has a UTI, she states having cloudy urine with an ammonia odor.  . Hyperlipidemia  . Chronic Kidney Disease    HPI: The patient is a 75 y.o. female who presents today for HTN. Pt complains of cloudy urine, not so much this morning. She mentions having an ammonia odor.  Cloudy urine She states in the AM her urine smells like ammonia and is cloudy in the AM, but gets clearer as the day goes on. She drinks a lot of water. She has no fever/chills, CVA tendnerness, frequency, urgency or dysuria.   Hypertension: Here for follow up of hypertension.  Currently on norvasc 5mg /day . Home readings range from 681-157 WIOMBTDH/74-16 diastolic. Takes medication as prescribed and denies any side effects. Exercise includes walking. Weight has been stable. Denies any chest pain, headaches, shortness of breath, vision changes, swelling in lower extremities.    Aorta arthrosclerosis and ascvd risk 20.5% On crestor 5mg /day.  Due for fasting lipid panel today.   CKD: labs today  Memory concerns She feels like it's subtle things like forgetting where she put things, calling someone a wrong name. She has not forgetten anything major and is under a lot of stress. No other family is concerned locally. She is still very active.    Review of Systems  Constitutional: Negative for chills, fatigue and fever.  HENT: Negative for dental problem, ear pain, hearing loss and trouble swallowing.   Eyes: Negative for visual disturbance.  Respiratory: Negative for cough, chest tightness and shortness of breath.   Cardiovascular: Negative for chest pain, palpitations and leg swelling.  Gastrointestinal: Negative for abdominal pain, blood in stool, diarrhea and nausea.  Endocrine: Negative for cold intolerance,  polydipsia, polyphagia and polyuria.  Genitourinary: Negative for dysuria, hematuria and pelvic pain.  Musculoskeletal: Negative for arthralgias.  Skin: Negative for rash.  Neurological: Negative for dizziness and headaches.  Psychiatric/Behavioral: Negative for dysphoric mood and sleep disturbance. The patient is not nervous/anxious.     Allergies Patient is allergic to augmentin [amoxicillin-pot clavulanate].  Past Medical History Patient  has a past medical history of Barrett's esophagus, Cataract, GERD (gastroesophageal reflux disease), HIATAL HERNIA, Hyperlipidemia, HYPERTENSION, Kidney stone, and Osteoporosis.  Surgical History Patient  has a past surgical history that includes Tonsillectomy (1968); Cataract extraction (2009); Lithotripsy; Vaginal hysterectomy (N/A, 06/12/2012); Cystocele repair (N/A, 06/12/2012); Hiatal hernia repair (2011); Cholecystectomy (11/02/2012); Upper gi endoscopy (11/02/2012); and Upper gastrointestinal endoscopy.  Family History Pateint's family history includes Breast cancer in her maternal grandmother and another family member; Breast cancer (age of onset: 21) in her mother; Colon cancer in her paternal aunt; Heart disease in an other family member; Hypertension in an other family member.  Social History Patient  reports that she has never smoked. She has never used smokeless tobacco. She reports that she does not drink alcohol and does not use drugs.    Objective: Vitals:   05/31/20 0808  BP: 112/60  Pulse: 84  Temp: 98 F (36.7 C)  TempSrc: Temporal  SpO2: 98%  Weight: 166 lb (75.3 kg)  Height: 5' 5.5" (1.664 m)    Body mass index is 27.2 kg/m.  Physical Exam Vitals reviewed.  Constitutional:      Appearance: Normal appearance. She is well-developed and well-nourished. She is obese.  HENT:  Head: Normocephalic and atraumatic.     Right Ear: External ear normal. There is impacted cerumen.     Left Ear: Tympanic membrane, ear canal and  external ear normal.     Mouth/Throat:     Mouth: Oropharynx is clear and moist.  Eyes:     Extraocular Movements: Extraocular movements intact and EOM normal.     Conjunctiva/sclera: Conjunctivae normal.     Pupils: Pupils are equal, round, and reactive to light.  Neck:     Thyroid: No thyromegaly.     Vascular: No carotid bruit.  Cardiovascular:     Rate and Rhythm: Normal rate and regular rhythm.     Pulses: Normal pulses and intact distal pulses.     Heart sounds: Normal heart sounds. No murmur heard.   Pulmonary:     Effort: Pulmonary effort is normal.     Breath sounds: Normal breath sounds.  Abdominal:     General: Abdomen is flat. Bowel sounds are normal. There is no distension.     Palpations: Abdomen is soft.     Tenderness: There is no abdominal tenderness. There is no right CVA tenderness or left CVA tenderness.  Musculoskeletal:     Cervical back: Normal range of motion and neck supple.  Lymphadenopathy:     Cervical: No cervical adenopathy.  Skin:    General: Skin is warm and dry.     Capillary Refill: Capillary refill takes less than 2 seconds.     Findings: No rash.  Neurological:     General: No focal deficit present.     Mental Status: She is alert and oriented to person, place, and time.     Cranial Nerves: No cranial nerve deficit.     Coordination: Coordination normal.     Deep Tendon Reflexes: Reflexes normal.  Psychiatric:        Mood and Affect: Mood and affect and mood normal.        Behavior: Behavior normal.    Flowsheet Row Office Visit from 05/31/2020 in [place]  PHQ-2 Total Score 0     UA: 1+ LE only, otherwise wnl      Assessment/plan: 1. Cloudy urine Sounds more like hemoconcentration. No other concerning signs on history or exam. Will check Ua/culture, no tx at this time.  - POCT Urinalysis Dipstick - Urine Culture  2. Essential hypertension Blood pressure is to goal. Continue current anti-hypertensive  medications-norvasc 45mg /day. Refills not given and routine lab work will be done today. Recommended routine exercise and healthy diet including DASH diet and mediterranean diet. Encouraged weight loss. F/u in 6 months.   - CBC with Differential/Platelet - Comprehensive metabolic panel - Microalbumin / creatinine urine ratio  3. Aortic atherosclerosis (HCC) Fasting lipid panel today. continue statin therapy  - Lipid panel  4. Stage 3 chronic kidney disease, unspecified whether stage 3a or 3b CKD ([place])  - Comprehensive metabolic panel   This visit occurred during the SARS-CoV-2 public health emergency.  Safety protocols were in place, including screening questions prior to the visit, additional usage of staff PPE, and extensive cleaning of exam room while observing appropriate contact time as indicated for disinfecting solutions.     Return in about 6 months (around 12/01/2020) for htn/CKD.   Orma Flaming, MD [place]   05/31/2020

## 2020-05-31 NOTE — Patient Instructions (Signed)
So good to see you! You look great!!! Routine labs, i'll call if you have UTI.   See you in 6 months. Stay healthy! Aw

## 2020-06-01 LAB — URINE CULTURE
MICRO NUMBER:: 11626614
Result:: NO GROWTH
SPECIMEN QUALITY:: ADEQUATE

## 2020-07-24 ENCOUNTER — Other Ambulatory Visit: Payer: Self-pay

## 2020-07-24 ENCOUNTER — Encounter: Payer: Self-pay | Admitting: Family Medicine

## 2020-07-24 ENCOUNTER — Ambulatory Visit (INDEPENDENT_AMBULATORY_CARE_PROVIDER_SITE_OTHER): Payer: Medicare Other

## 2020-07-24 ENCOUNTER — Other Ambulatory Visit: Payer: Self-pay | Admitting: Family Medicine

## 2020-07-24 ENCOUNTER — Ambulatory Visit (INDEPENDENT_AMBULATORY_CARE_PROVIDER_SITE_OTHER): Payer: Medicare Other | Admitting: Family Medicine

## 2020-07-24 VITALS — BP 124/70 | HR 76 | Temp 98.6°F | Ht 65.5 in | Wt 162.2 lb

## 2020-07-24 DIAGNOSIS — R002 Palpitations: Secondary | ICD-10-CM

## 2020-07-24 LAB — COMPREHENSIVE METABOLIC PANEL WITH GFR
ALT: 11 U/L (ref 0–35)
AST: 15 U/L (ref 0–37)
Albumin: 4.1 g/dL (ref 3.5–5.2)
Alkaline Phosphatase: 82 U/L (ref 39–117)
BUN: 21 mg/dL (ref 6–23)
CO2: 29 meq/L (ref 19–32)
Calcium: 10 mg/dL (ref 8.4–10.5)
Chloride: 101 meq/L (ref 96–112)
Creatinine, Ser: 1.15 mg/dL (ref 0.40–1.20)
GFR: 47.43 mL/min — ABNORMAL LOW
Glucose, Bld: 98 mg/dL (ref 70–99)
Potassium: 4.3 meq/L (ref 3.5–5.1)
Sodium: 140 meq/L (ref 135–145)
Total Bilirubin: 0.7 mg/dL (ref 0.2–1.2)
Total Protein: 6.6 g/dL (ref 6.0–8.3)

## 2020-07-24 LAB — CBC WITH DIFFERENTIAL/PLATELET
Basophils Absolute: 0 10*3/uL (ref 0.0–0.1)
Basophils Relative: 0.5 % (ref 0.0–3.0)
Eosinophils Absolute: 0.2 10*3/uL (ref 0.0–0.7)
Eosinophils Relative: 2.1 % (ref 0.0–5.0)
HCT: 43.9 % (ref 36.0–46.0)
Hemoglobin: 14.6 g/dL (ref 12.0–15.0)
Lymphocytes Relative: 24.4 % (ref 12.0–46.0)
Lymphs Abs: 1.8 10*3/uL (ref 0.7–4.0)
MCHC: 33.3 g/dL (ref 30.0–36.0)
MCV: 91.4 fl (ref 78.0–100.0)
Monocytes Absolute: 0.5 10*3/uL (ref 0.1–1.0)
Monocytes Relative: 7.5 % (ref 3.0–12.0)
Neutro Abs: 4.8 10*3/uL (ref 1.4–7.7)
Neutrophils Relative %: 65.5 % (ref 43.0–77.0)
Platelets: 240 10*3/uL (ref 150.0–400.0)
RBC: 4.8 Mil/uL (ref 3.87–5.11)
RDW: 14.7 % (ref 11.5–15.5)
WBC: 7.3 10*3/uL (ref 4.0–10.5)

## 2020-07-24 LAB — TSH: TSH: 0.83 u[IU]/mL (ref 0.35–4.50)

## 2020-07-24 MED ORDER — FLUOXETINE HCL 20 MG PO CAPS: 20.0000 mg | ORAL_CAPSULE | Freq: Every day | ORAL | 1 refills | Status: DC

## 2020-07-24 MED ORDER — HYDROXYZINE HCL 10 MG PO TABS: ORAL_TABLET | ORAL | 0 refills | Status: DC

## 2020-07-24 NOTE — Progress Notes (Signed)
Patient: Sara [name] MRN: [id number] DOB: 20-Jan-1946 PCP: Orma Flaming, MD     Subjective:  Chief Complaint  Patient presents with  . Palpitations    Worried about her daughter.    HPI: The patient is a 75 y.o. female who presents today for palpitations for about one months time. She is under a lot of stress and anxiety due to a daughter with drug addiction, recent hospitalization and near death who refuses treatment.  She feels them more at night. She also feels like she can feel a click if she lies flat on her back. The palpitations will last until she gets up and walks around and calms down. She denies any chest pain, leg swelling, shortness of breath, diaphoresis.   She also is stressed/anxious regarding her daughter and it's affecting her life. She has never been on medication in the past for depression or anxiety. She has never had an issue until now. She is tearful and forgetting things. She wants closure with her daughter and is just waiting for her to overdose again as the doctor told her that if she did this again she would not make it. She has no si/hi.     Review of Systems  Constitutional: Negative for chills, fatigue and fever.  HENT: Negative for dental problem, ear pain, hearing loss and trouble swallowing.   Eyes: Negative for visual disturbance.  Respiratory: Negative for cough, chest tightness and shortness of breath.   Cardiovascular: Positive for palpitations. Negative for chest pain and leg swelling.       At night  Gastrointestinal: Negative for abdominal pain, blood in stool, diarrhea and nausea.  Endocrine: Negative for cold intolerance, polydipsia, polyphagia and polyuria.  Genitourinary: Negative for dysuria and hematuria.  Musculoskeletal: Negative for arthralgias.  Skin: Negative for rash.  Neurological: Negative for dizziness, light-headedness and headaches.  Psychiatric/Behavioral: Negative for dysphoric mood and sleep disturbance. The patient  is nervous/anxious.     Allergies Patient is allergic to augmentin [amoxicillin-pot clavulanate].  Past Medical History Patient  has a past medical history of Barrett's esophagus, Cataract, GERD (gastroesophageal reflux disease), HIATAL HERNIA, Hyperlipidemia, HYPERTENSION, Kidney stone, and Osteoporosis.  Surgical History Patient  has a past surgical history that includes Tonsillectomy (1968); Cataract extraction (2009); Lithotripsy; Vaginal hysterectomy (N/A, 06/12/2012); Cystocele repair (N/A, 06/12/2012); Hiatal hernia repair (2011); Cholecystectomy (11/02/2012); Upper gi endoscopy (11/02/2012); and Upper gastrointestinal endoscopy.  Family History Pateint's family history includes Breast cancer in her maternal grandmother and another family member; Breast cancer (age of onset: 54) in her mother; Colon cancer in her paternal aunt; Heart disease in an other family member; Hypertension in an other family member.  Social History Patient  reports that she has never smoked. She has never used smokeless tobacco. She reports that she does not drink alcohol and does not use drugs.    Objective: Vitals:   07/24/20 0943  BP: 124/70  Pulse: 76  Temp: 98.6 F (37 C)  TempSrc: Temporal  SpO2: 100%  Weight: 162 lb 3.2 oz (73.6 kg)  Height: 5' 5.5" (1.664 m)    Body mass index is 26.58 kg/m.  Physical Exam Vitals reviewed.  Constitutional:      Appearance: Normal appearance. She is well-developed.  HENT:     Head: Normocephalic and atraumatic.     Right Ear: External ear normal.     Left Ear: External ear normal.  Eyes:     Conjunctiva/sclera: Conjunctivae normal.     Pupils: Pupils are equal, round, and  reactive to light.  Neck:     Thyroid: No thyromegaly.     Vascular: No carotid bruit.  Cardiovascular:     Rate and Rhythm: Normal rate and regular rhythm.     Pulses: Normal pulses.     Heart sounds: Normal heart sounds. No murmur heard.   Pulmonary:     Effort: Pulmonary  effort is normal.     Breath sounds: Normal breath sounds.  Abdominal:     General: Bowel sounds are normal. There is no distension.     Palpations: Abdomen is soft.     Tenderness: There is no abdominal tenderness.  Musculoskeletal:     Cervical back: Normal range of motion and neck supple.  Lymphadenopathy:     Cervical: No cervical adenopathy.  Skin:    General: Skin is warm and dry.     Capillary Refill: Capillary refill takes less than 2 seconds.     Findings: No rash.  Neurological:     General: No focal deficit present.     Mental Status: She is alert and oriented to person, place, and time.     Cranial Nerves: No cranial nerve deficit.     Coordination: Coordination normal.     Deep Tendon Reflexes: Reflexes normal.  Psychiatric:        Mood and Affect: Mood normal.        Behavior: Behavior normal.     Comments: Tearful at times         GAD 7 : Generalized Anxiety Score 07/24/2020  Nervous, Anxious, on Edge 3  Control/stop worrying 3  Worry too much - different things 2  Trouble relaxing 1  Restless 1  Easily annoyed or irritable 2  Afraid - awful might happen 3  Total GAD 7 Score 15  Anxiety Difficulty Somewhat difficult    Flowsheet Row Office Visit from 07/24/2020 in [place]  PHQ-9 Total Score 16     Ekg: nsr rate of 60bpm   Assessment/plan: 1. Palpitations Likely stress related, but will check labs and holter monitor. Also treating for anxiety. Precautions given.  - CBC with Differential/Platelet - Comprehensive metabolic panel - TSH   2. Anxiety/stress -GAD7 score is severe and her depression score is moderately severe. She has so much going on with her daughter which is the source of her anxiety and stress right now. Its impacting her physically and daily. Discussed starting medication, counseling, exercise. Will start her on low dose prozac and will take this daily. Also sending in low dose hydroxyzine to use prn for  anxiety. Drowsy precautions given. I've explained to her that drugs of the SSRI class can have side effects such as weight gain, sexual dysfunction, insomnia, headache, nausea. These medications are generally effective at alleviating symptoms of anxiety and/or depression. Let me know if significant side effects do occur.   Return in about 1 month (around 08/24/2020) for (whey alyssa is back) anxiety/stress med f/u .    Orma Flaming, MD [place]   07/24/2020

## 2020-07-24 NOTE — Patient Instructions (Addendum)
-  ekg and holter monitor ordered to make sure palpitations not a bad rhythm. They will call you with this.   -labs today  -starting you on medication called prozac that you will take every day in the AM. Takes about 3-4 weeks to really get into your system. Also sent in hydroxyzine to take as needed for anxiety.   Will be praying for you mrs. Sara [name]. I can't imagine the stress you are going through.   Dr. Rogers Blocker

## 2020-07-26 DIAGNOSIS — R002 Palpitations: Secondary | ICD-10-CM | POA: Diagnosis not present

## 2020-08-15 ENCOUNTER — Telehealth: Payer: Self-pay | Admitting: *Deleted

## 2020-08-15 DIAGNOSIS — R002 Palpitations: Secondary | ICD-10-CM | POA: Diagnosis not present

## 2020-08-15 NOTE — Telephone Encounter (Signed)
Spoke with [employer], they called to report the patients 14 days monitor showed 417 runs of SVT. Monitor results routed to ordering provider Orma Flaming md. Also spoke with the office to let them know the results were available and abnormal.

## 2020-08-15 NOTE — Telephone Encounter (Signed)
[employer] called to report abnormal zio monitor. Patient is not an established cardiology patient

## 2020-08-17 ENCOUNTER — Encounter: Payer: Self-pay | Admitting: Family Medicine

## 2020-08-18 ENCOUNTER — Encounter: Payer: Self-pay | Admitting: Family Medicine

## 2020-08-18 ENCOUNTER — Ambulatory Visit (INDEPENDENT_AMBULATORY_CARE_PROVIDER_SITE_OTHER): Payer: Medicare Other | Admitting: Family Medicine

## 2020-08-18 ENCOUNTER — Other Ambulatory Visit: Payer: Self-pay

## 2020-08-18 VITALS — BP 150/70 | HR 70 | Temp 98.2°F | Ht 65.5 in | Wt 164.6 lb

## 2020-08-18 DIAGNOSIS — F419 Anxiety disorder, unspecified: Secondary | ICD-10-CM

## 2020-08-18 DIAGNOSIS — I471 Supraventricular tachycardia: Secondary | ICD-10-CM

## 2020-08-18 MED ORDER — SERTRALINE HCL 50 MG PO TABS: 50.0000 mg | ORAL_TABLET | Freq: Every day | ORAL | 3 refills | Status: DC

## 2020-08-18 NOTE — Patient Instructions (Addendum)
-  referral to dr. Caryl Comes put in for you. They will call you with this, please let us know if you don't hear from them.   -since having headaches on the prozac let's change you over to zoloft. Im sending in 50mg /day. I would see how you are doing after 2 weeks and if needed can increase to 75mg /day.   -would f/u with dr. Jerline Pain in 4 months to see how you are doing regarding this as well as labs and routine f/u.

## 2020-08-18 NOTE — Progress Notes (Signed)
Patient: Sara [name] MRN: [id number] DOB: 10-Oct-1945 PCP: Orma Flaming, MD     Subjective:  Chief Complaint  Patient presents with  . Anxiety    Pt is unsure if Prozac is working or if she has calmed down from the situation.  . svt    HPI: The patient is a 75 y.o. female who presents today for Anxiety and Stress management. She would also like to discuss results from heart monitor.  SVT She just finished her heart monitor that showed  Patient had a min HR of 48 bpm, max HR of 266 bpm, and avg HR of 65 bpm. Predominant underlying rhythm was Sinus Rhythm. 417 Supraventricular Tachycardia runs occurred, the run with the fastest interval lasting 1 min 51 secs with a max rate of 266 bpm, the longest lasting 2 mins 1 sec with an avg rate of 216 bpm. Supraventricular Tachycardia was detected within +/- 45 seconds of symptomatic patient event -recommended cardiology consult and referred today to Dr. Caryl Comes.   Anxiety I saw her a few weeks ago and we started her on prozac 20mg . She is doing much better, but also her situation has gotten better. She also can tell a difference in how less fidgedity she is and how much she is thinking of her daughter. She has noticed increased headaches on prozac. About 3x/week.     Review of Systems  Constitutional: Negative for chills, fatigue and fever.  HENT: Negative for dental problem, ear pain, hearing loss and trouble swallowing.   Eyes: Negative for visual disturbance.  Respiratory: Negative for cough, chest tightness and shortness of breath.   Cardiovascular: Negative for chest pain, palpitations and leg swelling.  Gastrointestinal: Negative for abdominal pain, blood in stool, diarrhea and nausea.  Endocrine: Negative for cold intolerance, polydipsia, polyphagia and polyuria.  Genitourinary: Negative for dysuria and hematuria.  Musculoskeletal: Negative for arthralgias.  Skin: Negative for rash.  Neurological: Negative for dizziness  and headaches.  Psychiatric/Behavioral: Negative for dysphoric mood and sleep disturbance. The patient is not nervous/anxious.     Allergies Patient is allergic to augmentin [amoxicillin-pot clavulanate].  Past Medical History Patient  has a past medical history of Barrett's esophagus, Cataract, GERD (gastroesophageal reflux disease), HIATAL HERNIA, Hyperlipidemia, HYPERTENSION, Kidney stone, and Osteoporosis.  Surgical History Patient  has a past surgical history that includes Tonsillectomy (1968); Cataract extraction (2009); Lithotripsy; Vaginal hysterectomy (N/A, 06/12/2012); Cystocele repair (N/A, 06/12/2012); Hiatal hernia repair (2011); Cholecystectomy (11/02/2012); Upper gi endoscopy (11/02/2012); and Upper gastrointestinal endoscopy.  Family History Pateint's family history includes Breast cancer in her maternal grandmother and another family member; Breast cancer (age of onset: 62) in her mother; Colon cancer in her paternal aunt; Heart disease in an other family member; Hypertension in an other family member.  Social History Patient  reports that she has never smoked. She has never used smokeless tobacco. She reports that she does not drink alcohol and does not use drugs.    Objective: Vitals:   08/18/20 0915  BP: (!) 150/70  Pulse: 70  Temp: 98.2 F (36.8 C)  TempSrc: Temporal  SpO2: 99%  Weight: 164 lb 9.6 oz (74.7 kg)  Height: 5' 5.5" (1.664 m)    Body mass index is 26.97 kg/m.  Physical Exam Vitals reviewed.  Constitutional:      Appearance: Normal appearance. She is normal weight.  HENT:     Head: Normocephalic and atraumatic.  Cardiovascular:     Rate and Rhythm: Normal rate and regular rhythm.  Heart sounds: Normal heart sounds.  Pulmonary:     Effort: Pulmonary effort is normal.     Breath sounds: Normal breath sounds.  Abdominal:     General: Bowel sounds are normal.     Palpations: Abdomen is soft.  Musculoskeletal:     Cervical back: Normal range  of motion and neck supple.  Neurological:     General: No focal deficit present.     Mental Status: She is alert and oriented to person, place, and time.  Psychiatric:        Mood and Affect: Mood normal.        Behavior: Behavior normal.    GAD 7 : Generalized Anxiety Score 08/18/2020 07/24/2020  Nervous, Anxious, on Edge 0 3  Control/stop worrying 0 3  Worry too much - different things 1 2  Trouble relaxing 0 1  Restless 1 1  Easily annoyed or irritable 0 2  Afraid - awful might happen 1 3  Total GAD 7 Score 3 15  Anxiety Difficulty Not difficult at all Somewhat difficult        Assessment/plan: 1. SVT (supraventricular tachycardia) (HCC) Burden high with SVT.HR >200. Referral done to electrophysiology.  -has been asymptomatic except x 1 event on monitor.  - Ambulatory referral to Cardiology  2. Anxiety GAD7 score significantly improved on prozac, but since having headaches will change her over to zoloft 50mg . She may need to increase to 75mg  after 2-3 weeks, but will start on lower dose. Will f/u with dr. Jerline Pain in 4 months for regular f/u with labs for Htn/ckd. Let us know if any issues with new medication.     Return in about 4 months (around 12/19/2020) for anxiety/HTN/CKD/labs  f/u with Dr. Jerline Pain, requesting MD. .   Orma Flaming, MD [place]   08/18/2020

## 2020-09-17 DIAGNOSIS — I471 Supraventricular tachycardia, unspecified: Secondary | ICD-10-CM | POA: Insufficient documentation

## 2020-09-18 ENCOUNTER — Ambulatory Visit: Payer: Medicare Other | Admitting: Internal Medicine

## 2020-09-18 ENCOUNTER — Other Ambulatory Visit: Payer: Self-pay

## 2020-09-18 ENCOUNTER — Encounter: Payer: Self-pay | Admitting: Internal Medicine

## 2020-09-18 VITALS — BP 142/78 | HR 87 | Ht 67.5 in | Wt 165.4 lb

## 2020-09-18 DIAGNOSIS — R072 Precordial pain: Secondary | ICD-10-CM

## 2020-09-18 DIAGNOSIS — R079 Chest pain, unspecified: Secondary | ICD-10-CM | POA: Diagnosis not present

## 2020-09-18 DIAGNOSIS — R0600 Dyspnea, unspecified: Secondary | ICD-10-CM

## 2020-09-18 DIAGNOSIS — I471 Supraventricular tachycardia: Secondary | ICD-10-CM | POA: Diagnosis not present

## 2020-09-18 MED ORDER — VERAPAMIL HCL ER 180 MG PO TBCR
180.0000 mg | EXTENDED_RELEASE_TABLET | Freq: Every day | ORAL | 3 refills | Status: DC
  Filled 2021-03-12: qty 90, 90d supply, fill #0
  Filled 2021-06-04: qty 90, 90d supply, fill #1

## 2020-09-18 NOTE — Patient Instructions (Signed)
Medication Instructions:  Your physician has recommended you make the following change in your medication:   ** Stop Norvasc  ** Begin Verapamil 180 mg - 1 tablet by mouth daily  *If you need a refill on your cardiac medications before your next appointment, please call your pharmacy*   Lab Work: None ordered.  If you have labs (blood work) drawn today and your tests are completely normal, you will receive your results only by: [place] (if you have MyChart) OR A paper copy in the mail If you have any lab test that is abnormal or we need to change your treatment, we will call you to review the results.   Testing/Procedures: Your physician has requested that you have an echocardiogram. Echocardiography is a painless test that uses sound waves to create images of your heart. It provides your doctor with information about the size and shape of your heart and how well your heart's chambers and valves are working. This procedure takes approximately one hour. There are no restrictions for this procedure. Your physician has requested that you have a lexiscan myoview. For further information please visit [email or web address]. Please follow instruction sheet, as given.    Follow-Up: At [facility], you and your health needs are our priority.  As part of our continuing mission to provide you with exceptional heart care, we have created designated Provider Care Teams.  These Care Teams include your primary Cardiologist (physician) and Advanced Practice Providers (APPs -  Physician Assistants and Nurse Practitioners) who all work together to provide you with the care you need, when you need it.  We recommend signing up for the patient portal called "MyChart".  Sign up information is provided on this After Visit Summary.  MyChart is used to connect with patients for Virtual Visits (Telemedicine).  Patients are able to view lab/test results, encounter notes, upcoming appointments, etc.   Non-urgent messages can be sent to your provider as well.   To learn more about what you can do with MyChart, go to [email or web address].    Your next appointment:   6 week(s)  The format for your next appointment:   In Person  Provider:   You will see one of the following Advanced Practice Providers on your designated Care Team:   Chanetta Marshall, NP Tommye Standard, PA-C Legrand Como "[place]" [place]

## 2020-09-18 NOTE — Progress Notes (Signed)
ELECTROPHYSIOLOGY CONSULT NOTE  Patient ID: Sara [name], MRN: [id number], DOB/AGE: 1945/04/03 75 y.o. Admit date: (Not on file) Date of Consult: 09/18/2020  Primary Physician: Orma Flaming, MD Primary Cardiologist: new     Sara [name] is a 75 y.o. female who is being seen today for the evaluation of chest pain and abnromal monitor at the request of Dr AW.    HPI Sara [name] is a 76 y.o. female referred by Dr. Eliberto Ivory for evaluation of palpitations and chest discomfort.  She had describes a couple of episodes over the last few months where she would get the sudden onset of chest discomfort-localized midsternally associated with dyspnea but not associated with palpitations.  She had also complained to her primary care physician of palpitations which might be related to exertion but sometimes occurring at rest unassociated with lightheadedness.  Event recorder was obtained which demonstrated  Event Recorder personnally reviewed   I burden of PACs and couplets to the tune of about 90% of her total beats but also frequent runs of SVT (417) the longest lasting 2 minutes and the fastest heart rates in the 250 range.  1 of these episodes was associated with the aforementioned symptoms of chest discomfort and shortness of breath and not associated with palpitations.  She also had multiple episodes of tachycardia during waking hours that were not associated with device activation.  This not withstanding heart rates greater than 200s.  Denies exercise chest pain but does have exertional dyspnea climbing a flight of stairs.  No nocturnal dyspnea orthopnea.  No peripheral edema   Date Cr K Hgb  5/22 1.15 4.3 14.6          Event Recorder personnally reviewed    Past Medical History:  Diagnosis Date   Barrett's esophagus    Cataract    had surgery   GERD (gastroesophageal reflux disease)    hx of    HIATAL HERNIA    s/p surgical repair   Hyperlipidemia     HYPERTENSION    Kidney stone    1990s   Osteoporosis       Surgical History:  Past Surgical History:  Procedure Laterality Date   CATARACT EXTRACTION  2009   both eyes   CHOLECYSTECTOMY  11/02/2012   Procedure: CHOLECYSTECTOMY;  Surgeon: Pedro Earls, MD;  Location: [facility];  Service: General;;   CYSTOCELE REPAIR N/A 06/12/2012   Procedure: ANTERIOR REPAIR (CYSTOCELE);  Surgeon: Elveria Royals, MD;  Location: [facility];  Service: Gynecology;  Laterality: N/A;   HIATAL HERNIA REPAIR  2011   LITHOTRIPSY     TONSILLECTOMY  1968   UPPER GASTROINTESTINAL ENDOSCOPY     UPPER GI ENDOSCOPY  11/02/2012   Procedure: UPPER GI ENDOSCOPY;  Surgeon: Pedro Earls, MD;  Location: [facility];  Service: General;;   VAGINAL HYSTERECTOMY N/A 06/12/2012   Procedure: HYSTERECTOMY VAGINAL;  Surgeon: Elveria Royals, MD;  Location: [facility];  Service: Gynecology;  Laterality: N/A;     Home Meds: Current Meds  Medication Sig   Calcium Carbonate-Vitamin D (CALCIUM-VITAMIN D) 500-200 MG-UNIT per tablet Take 1 tablet by mouth 2 (two) times daily with a meal.   Cyanocobalamin (VITAMIN B-12 CR PO) Take 4,000 Units by mouth daily.    Multiple Vitamin (MULTIVITAMIN WITH MINERALS) TABS Take 1 tablet by mouth daily.   pantoprazole (PROTONIX) 40 MG tablet Take 1 tablet (40 mg total) by mouth 2 (two) times daily before a meal.  Take 20-30 minutes before breakfast and dinner meals.   Probiotic Product (PROBIOTIC DAILY PO) Take by mouth daily.   rosuvastatin (CRESTOR) 5 MG tablet TAKE 1 TABLET BY MOUTH EVERY DAY   sertraline (ZOLOFT) 50 MG tablet Take 1 tablet (50 mg total) by mouth daily.   verapamil (CALAN-SR) 180 MG CR tablet Take 1 tablet (180 mg total) by mouth daily.   [DISCONTINUED] amLODipine (NORVASC) 5 MG tablet TAKE 1 TABLET BY MOUTH EVERY DAY IN THE MORNING   [DISCONTINUED] hydrOXYzine (ATARAX/VISTARIL) 10 MG tablet Take 1-2 tablets by mouth every 8 hours as needed for anxiety    Allergies:  Allergies   Allergen Reactions   Augmentin [Amoxicillin-Pot Clavulanate] Diarrhea    Social History   Socioeconomic History   Marital status: Widowed    Spouse name: Not on file   Number of children: 5   Years of education: 14   Highest education level: Not on file  Occupational History   Occupation: Retired  Tobacco Use   Smoking status: Never   Smokeless tobacco: Never  Vaping Use   Vaping Use: Never used  Substance and Sexual Activity   Alcohol use: No   Drug use: No   Sexual activity: Yes    Birth control/protection: Post-menopausal  Other Topics Concern   Not on file  Social History Narrative   widowed since late 2011 (spouse had mod-adv dementia) 5 grown children, 3 nearby-,many g-kids   Social Determinants of Radio broadcast assistant Strain: Not on file  Food Insecurity: Not on file  Transportation Needs: Not on file  Physical Activity: Not on file  Stress: Not on file  Social Connections: Not on file  Intimate Partner Violence: Not on file     Family History  Problem Relation Age of Onset   Breast cancer Mother 33       with mets    Hypertension Other        Parent   Breast cancer Other    Heart disease Other        parent, grandparent   Colon cancer Paternal Aunt    Breast cancer Maternal Grandmother    Esophageal cancer Neg Hx    Rectal cancer Neg Hx    Stomach cancer Neg Hx    Colon polyps Neg Hx      ROS:  Please see the history of present illness.     All other systems reviewed and negative.    Physical Exam: Blood pressure (!) 142/78, pulse 87, height 5' 7.5" (1.715 m), weight 165 lb 6.4 oz (75 kg), SpO2 97 %. General: Well developed, well nourished female in no acute distress. Head: Normocephalic, atraumatic, sclera non-icteric, no xanthomas, nares are without discharge. EENT: normal  Lymph Nodes:  none Neck: Negative for carotid bruits. JVD not elevated. Back:without scoliosis kyphosis Lungs: Clear bilaterally to auscultation without  wheezes, rales, or rhonchi. Breathing is unlabored. Heart: RRR with S1 S2. No  murmur . No rubs, or gallops appreciated. Abdomen: Soft, non-tender, non-distended with normoactive bowel sounds. No hepatomegaly. No rebound/guarding. No obvious abdominal masses. Msk:  Strength and tone appear normal for age. Extremities: No clubbing or cyanosis. No edema.  Distal pedal pulses are 2+ and equal bilaterally. Skin: Warm and Dry Neuro: Alert and oriented X 3. CN III-XII intact Grossly normal sensory and motor function . Psych:  Responds to questions appropriately with a normal affect.      Labs: Cardiac Enzymes No results for input(s): CKTOTAL, CKMB, TROPONINI in  the last 72 hours. CBC Lab Results  Component Value Date   WBC 7.3 07/24/2020   HGB 14.6 07/24/2020   HCT 43.9 07/24/2020   MCV 91.4 07/24/2020   PLT 240.0 07/24/2020   PROTIME: No results for input(s): LABPROT, INR in the last 72 hours. Chemistry No results for input(s): NA, K, CL, CO2, BUN, CREATININE, CALCIUM, PROT, BILITOT, ALKPHOS, ALT, AST, GLUCOSE in the last 168 hours.  Invalid input(s): LABALBU Lipids Lab Results  Component Value Date   CHOL 126 05/31/2020   HDL 53.90 05/31/2020   LDLCALC 53 05/31/2020   TRIG 98.0 05/31/2020   BNP No results found for: PROBNP Thyroid Function Tests: No results for input(s): TSH, T4TOTAL, T3FREE, THYROIDAB in the last 72 hours.  Invalid input(s): FREET3 Miscellaneous No results found for: DDIMER  Radiology/Studies:  No results found.  EKG: Sinus rhythm at 87 Intervals 13/08/38 Frequent PACs   Assessment and Plan:  Atrial ectopy and atrial tachycardia  Chest tightness with shortness of breath  Hypertension   The patient has frequent episodes of irregular rapid atrial tachycardia not clearly atrial fibrillation as there are intermittent clearly discernible P waves.  The chest pain syndrome associate with shortness of breath at least on 1 occasion temporally  correlated with 1 of these events suggesting that they may be secondary.  Rate control is appropriate as the max rates are in the 250 range.  We will begin by discontinuing her amlodipine and starting her on verapamil at 180 mg daily.  I do this with some hesitation as we do not know what her LV function is yet; however, we will obtain an echocardiogram to assess.  Moreover, we need to know what her coronary status is so we will undertake either CTA or Myoview scanning which ever can be done most expeditiously.   We discussed antiarrhythmic drugs and ablation.  Both of these would be informed by the results of the aforementioned testing.  There is no certain evidence of atrial fibrillation so we will not begin anticoagulation at this time  Virl Axe

## 2020-09-20 ENCOUNTER — Telehealth (HOSPITAL_COMMUNITY): Payer: Self-pay | Admitting: Radiology

## 2020-09-20 NOTE — Telephone Encounter (Signed)
Patient given detailed instructions per Myocardial Perfusion Study Information Sheet for the test on 10/10/20 at 7:15. Patient notified to arrive 15 minutes early and that it is imperative to arrive on time for appointment to keep from having the test rescheduled.  If you need to cancel or reschedule your appointment, please call the office within 24 hours of your appointment. . Patient verbalized understanding.EHK

## 2020-10-04 ENCOUNTER — Telehealth: Payer: Self-pay

## 2020-10-04 NOTE — Telephone Encounter (Signed)
Detailed instructions left on the patient's answering machine. Asked to call back with any questions. S.Wilho Sharpley EMTP 

## 2020-10-10 ENCOUNTER — Ambulatory Visit (HOSPITAL_BASED_OUTPATIENT_CLINIC_OR_DEPARTMENT_OTHER): Payer: Medicare Other

## 2020-10-10 ENCOUNTER — Ambulatory Visit (HOSPITAL_COMMUNITY): Payer: Medicare Other | Attending: Cardiology

## 2020-10-10 ENCOUNTER — Other Ambulatory Visit: Payer: Self-pay

## 2020-10-10 DIAGNOSIS — R0600 Dyspnea, unspecified: Secondary | ICD-10-CM | POA: Diagnosis not present

## 2020-10-10 DIAGNOSIS — R072 Precordial pain: Secondary | ICD-10-CM | POA: Insufficient documentation

## 2020-10-10 DIAGNOSIS — R079 Chest pain, unspecified: Secondary | ICD-10-CM | POA: Diagnosis not present

## 2020-10-10 DIAGNOSIS — I471 Supraventricular tachycardia: Secondary | ICD-10-CM | POA: Insufficient documentation

## 2020-10-10 LAB — ECHOCARDIOGRAM COMPLETE
Area-P 1/2: 2.62 cm2
S' Lateral: 3 cm
Weight: 2640 oz

## 2020-10-10 LAB — MYOCARDIAL PERFUSION IMAGING
LV dias vol: 69 mL (ref 46–106)
LV sys vol: 27 mL
Peak HR: 93 {beats}/min
Rest HR: 67 {beats}/min
SDS: 8
SRS: 1
SSS: 9
TID: 1

## 2020-10-10 MED ORDER — TECHNETIUM TC 99M TETROFOSMIN IV KIT
10.6000 | PACK | Freq: Once | INTRAVENOUS | Status: AC | PRN
  Administered 2020-10-10: 10.6 via INTRAVENOUS
  Filled 2020-10-10: qty 11

## 2020-10-10 MED ORDER — TECHNETIUM TC 99M TETROFOSMIN IV KIT
32.2000 | PACK | Freq: Once | INTRAVENOUS | Status: AC | PRN
  Administered 2020-10-10: 32.2 via INTRAVENOUS
  Filled 2020-10-10: qty 33

## 2020-10-10 MED ORDER — REGADENOSON 0.4 MG/5ML IV SOLN
0.4000 mg | Freq: Once | INTRAVENOUS | Status: AC
  Administered 2020-10-10: 0.4 mg via INTRAVENOUS

## 2020-10-23 ENCOUNTER — Telehealth: Payer: Self-pay

## 2020-10-23 DIAGNOSIS — I422 Other hypertrophic cardiomyopathy: Secondary | ICD-10-CM

## 2020-10-23 DIAGNOSIS — R931 Abnormal findings on diagnostic imaging of heart and coronary circulation: Secondary | ICD-10-CM

## 2020-10-23 NOTE — Telephone Encounter (Signed)
Attempted phone call to pt and left voicemail message to contact RN at [phone]. 

## 2020-10-23 NOTE — Telephone Encounter (Signed)
-----   Message from Deboraha Sprang, MD sent at 10/18/2020  5:09 PM EDT ----- Please Inform Patient that stress test showed no evidence of ischemia,  normal  heart muscle function but suggestion of a prior ( presumably silent) MI.  This precludes some drugs for her palpitations, but the verapamil now is quite appropriate Is hse any better  Thanks

## 2020-10-23 NOTE — Telephone Encounter (Signed)
Spoke with pt and advised per Dr Caryl Comes of [place] and Echo results as below.  Pt states she is taking Verapamil as prescribed and does complain of occasional headache.  Pt reports she has not felt any palpitations. Provided education of HCM and possibility that there can be a genetic component.  Pt states her father died suddenly at age 73 of MI without any previous history of heart disease.  Discussed need for cMRI with pt.  Pt verbalizes understanding and agrees with current plan.  Order placed and messages sent to precert and scheduling.

## 2020-10-23 NOTE — Telephone Encounter (Addendum)
-----   Message from Sara Sprang, MD sent at 10/18/2020  5:09 PM EDT ----- Please Inform Patient that stress test showed no evidence of ischemia,  normal  heart muscle function but suggestion of a prior ( presumably silent) MI.  This precludes some drugs for her palpitations, but the verapamil now is quite appropriate Is hse any better  Please Inform Patient Echo showed  normal  heart muscle function but there is a suggestion of HCM, with asymmetric LVH involving the septum  This can be overcalled by the echo, and we should do cMRI to clarify, both for her sake andfor the concdrn that it can have agenetic component

## 2020-10-30 ENCOUNTER — Telehealth (HOSPITAL_COMMUNITY): Payer: Self-pay | Admitting: Emergency Medicine

## 2020-10-30 NOTE — Telephone Encounter (Signed)
Reaching out to patient to offer assistance regarding upcoming cardiac imaging study; pt verbalizes understanding of appt date/time, parking situation and where to check in, and verified current allergies; name and call back number provided for further questions should they arise Sara Bond RN Navigator Cardiac Imaging [facility] [phone] office [phone] cell  Denies implants, Denies claustro Denies iv issues

## 2020-10-31 ENCOUNTER — Ambulatory Visit (HOSPITAL_COMMUNITY)
Admission: RE | Admit: 2020-10-31 | Discharge: 2020-10-31 | Disposition: A | Discharge status: Home / Self Care | Payer: Medicare Other | Source: Ambulatory Visit | Attending: Internal Medicine | Admitting: Internal Medicine

## 2020-10-31 ENCOUNTER — Other Ambulatory Visit: Payer: Self-pay

## 2020-10-31 DIAGNOSIS — R931 Abnormal findings on diagnostic imaging of heart and coronary circulation: Secondary | ICD-10-CM | POA: Diagnosis not present

## 2020-10-31 DIAGNOSIS — I422 Other hypertrophic cardiomyopathy: Secondary | ICD-10-CM | POA: Diagnosis not present

## 2020-10-31 MED ORDER — GADOBUTROL 1 MMOL/ML IV SOLN
11.0000 mL | Freq: Once | INTRAVENOUS | Status: AC | PRN
  Administered 2020-10-31: 11 mL via INTRAVENOUS

## 2020-11-01 ENCOUNTER — Other Ambulatory Visit: Payer: Self-pay | Admitting: Family Medicine

## 2020-11-01 DIAGNOSIS — Z1231 Encounter for screening mammogram for malignant neoplasm of breast: Secondary | ICD-10-CM

## 2020-11-03 NOTE — Progress Notes (Signed)
PCP:  Orma Flaming, MD Primary Cardiologist: None Electrophysiologist: Virl Axe, MD   Sara [name] is a 75 y.o. female seen today for Virl Axe, MD for routine electrophysiology followup.  Since last being seen in our clinic the patient reports doing well. She hasn't had her "racing" feeling very much at all. Last night she did have palpitations that she could feel in her ears. She had some tingling in her hand as well. Thinks epigastric pain is mostly related to hernia.  She has noticed a mild tension type HA as she heads into the evenings. This is gradually improving.  she denies dyspnea, PND, orthopnea, nausea, vomiting, dizziness, syncope, edema, weight gain, or early satiety.  Past Medical History:  Diagnosis Date   Barrett's esophagus    Cataract    had surgery   GERD (gastroesophageal reflux disease)    hx of    HIATAL HERNIA    s/p surgical repair   Hyperlipidemia    HYPERTENSION    Kidney stone    1990s   Osteoporosis    Past Surgical History:  Procedure Laterality Date   CATARACT EXTRACTION  2009   both eyes   CHOLECYSTECTOMY  11/02/2012   Procedure: CHOLECYSTECTOMY;  Surgeon: Pedro Earls, MD;  Location: [facility];  Service: General;;   CYSTOCELE REPAIR N/A 06/12/2012   Procedure: ANTERIOR REPAIR (CYSTOCELE);  Surgeon: Elveria Royals, MD;  Location: [facility];  Service: Gynecology;  Laterality: N/A;   HIATAL HERNIA REPAIR  2011   LITHOTRIPSY     TONSILLECTOMY  1968   UPPER GASTROINTESTINAL ENDOSCOPY     UPPER GI ENDOSCOPY  11/02/2012   Procedure: UPPER GI ENDOSCOPY;  Surgeon: Pedro Earls, MD;  Location: [facility];  Service: General;;   VAGINAL HYSTERECTOMY N/A 06/12/2012   Procedure: HYSTERECTOMY VAGINAL;  Surgeon: Elveria Royals, MD;  Location: [facility];  Service: Gynecology;  Laterality: N/A;    Current Outpatient Medications  Medication Sig Dispense Refill   Calcium Carbonate-Vitamin D (CALCIUM-VITAMIN D) 500-200 MG-UNIT per tablet Take 1 tablet  by mouth 2 (two) times daily with a meal.     Cyanocobalamin (VITAMIN B-12 CR PO) Take 4,000 Units by mouth daily.      Multiple Vitamin (MULTIVITAMIN WITH MINERALS) TABS Take 1 tablet by mouth daily.     pantoprazole (PROTONIX) 40 MG tablet Take 1 tablet (40 mg total) by mouth 2 (two) times daily before a meal. Take 20-30 minutes before breakfast and dinner meals. 60 tablet 11   Probiotic Product (PROBIOTIC DAILY PO) Take by mouth daily.     rosuvastatin (CRESTOR) 5 MG tablet TAKE 1 TABLET BY MOUTH EVERY DAY 90 tablet 3   sertraline (ZOLOFT) 50 MG tablet Take 1 tablet (50 mg total) by mouth daily. 90 tablet 3   verapamil (CALAN-SR) 180 MG CR tablet Take 1 tablet (180 mg total) by mouth daily. 90 tablet 3   No current facility-administered medications for this visit.    Allergies  Allergen Reactions   Augmentin [Amoxicillin-Pot Clavulanate] Diarrhea    Social History   Socioeconomic History   Marital status: Widowed    Spouse name: Not on file   Number of children: 5   Years of education: 14   Highest education level: Not on file  Occupational History   Occupation: Retired  Tobacco Use   Smoking status: Never   Smokeless tobacco: Never  Vaping Use   Vaping Use: Never used  Substance and Sexual Activity  Alcohol use: No   Drug use: No   Sexual activity: Yes    Birth control/protection: Post-menopausal  Other Topics Concern   Not on file  Social History Narrative   widowed since late 2011 (spouse had mod-adv dementia) 5 grown children, 3 nearby-,many g-kids   Social Determinants of Radio broadcast assistant Strain: Not on file  Food Insecurity: Not on file  Transportation Needs: Not on file  Physical Activity: Not on file  Stress: Not on file  Social Connections: Not on file  Intimate Partner Violence: Not on file     Review of Systems: General: No chills, fever, night sweats or weight changes  Cardiovascular:  No chest pain, dyspnea on exertion, edema,  orthopnea, palpitations, paroxysmal nocturnal dyspnea Dermatological: No rash, lesions or masses Respiratory: No cough, dyspnea Urologic: No hematuria, dysuria Abdominal: No nausea, vomiting, diarrhea, bright red blood per rectum, melena, or hematemesis Neurologic: No visual changes, weakness, changes in mental status All other systems reviewed and are otherwise negative except as noted above.  Physical Exam: Vitals:   11/06/20 0752  BP: 124/80  Pulse: 79  SpO2: 98%  Weight: 165 lb 12.8 oz (75.2 kg)  Height: 5' 5.5" (1.664 m)    GEN- The patient is well appearing, alert and oriented x 3 today.   HEENT: normocephalic, atraumatic; sclera clear, conjunctiva pink; hearing intact; oropharynx clear; neck supple, no JVP Lymph- no cervical lymphadenopathy Lungs- Clear to ausculation bilaterally, normal work of breathing.  No wheezes, rales, rhonchi Heart- Regular rate and rhythm, no murmurs, rubs or gallops, PMI not laterally displaced GI- soft, non-tender, non-distended, bowel sounds present, no hepatosplenomegaly Extremities- no clubbing, cyanosis, or edema; DP/PT/radial pulses 2+ bilaterally MS- no significant deformity or atrophy Skin- warm and dry, no rash or lesion Psych- euthymic mood, full affect Neuro- strength and sensation are intact  EKG is not ordered.  Additional studies reviewed include: Previous EP office notes.   Assessment and Plan:  Atrial ectopy and atrial tachycardia   Chest tightness with shortness of breath   Hypertension  Myoview 10/10/20 shows no ischemia  Echo 10/10/20 showed LVEF 55-60%, with asymmetric LVH concerning for HCM.  cMRI 10/31/2020 showed LVEF 55%, mild asymmetrical LVH at 12 mm, which does not meet criteria for HCM  Continue verapamil. If HAs do not continue to improve consider change to Diltiazem OR with structurally normal heart her palpitations may be more completely quieted on Flecainide + BB.  RTC 2-3 months with Dr. Caryl Comes to further  assess control.   Shirley Friar, PA-C  11/06/20 8:03 AM

## 2020-11-06 ENCOUNTER — Other Ambulatory Visit: Payer: Self-pay

## 2020-11-06 ENCOUNTER — Ambulatory Visit: Payer: Medicare Other | Admitting: Student

## 2020-11-06 ENCOUNTER — Encounter: Payer: Self-pay | Admitting: Student

## 2020-11-06 VITALS — BP 124/80 | HR 79 | Ht 65.5 in | Wt 165.8 lb

## 2020-11-06 DIAGNOSIS — R931 Abnormal findings on diagnostic imaging of heart and coronary circulation: Secondary | ICD-10-CM | POA: Diagnosis not present

## 2020-11-06 DIAGNOSIS — R079 Chest pain, unspecified: Secondary | ICD-10-CM

## 2020-11-06 DIAGNOSIS — I471 Supraventricular tachycardia: Secondary | ICD-10-CM

## 2020-11-06 DIAGNOSIS — R0600 Dyspnea, unspecified: Secondary | ICD-10-CM

## 2020-11-06 NOTE — Patient Instructions (Signed)

## 2020-11-07 ENCOUNTER — Encounter: Payer: Medicare Other | Admitting: Physician Assistant

## 2020-11-07 ENCOUNTER — Telehealth: Payer: Self-pay | Admitting: Internal Medicine

## 2020-11-07 NOTE — Telephone Encounter (Signed)
Sara [name] called stating she is returning Marsha's call she received this afternoon.

## 2020-11-07 NOTE — Telephone Encounter (Signed)
Spoke with pt and advised of cMRI results as below.  Pt verbalized understanding and thanked Therapist, sports for the call.       Deboraha Sprang, MD  11/06/2020  5:39 PM EDT     Please Inform Patient that -CMRI   is abnormal with some asymmetry but not sufficiently abnormal to make a diagnosis of HCM   will follow

## 2020-11-09 ENCOUNTER — Ambulatory Visit
Admission: RE | Admit: 2020-11-09 | Discharge: 2020-11-09 | Disposition: A | Discharge status: Home / Self Care | Payer: Medicare Other | Source: Ambulatory Visit

## 2020-11-09 ENCOUNTER — Other Ambulatory Visit: Payer: Self-pay

## 2020-11-09 DIAGNOSIS — Z1231 Encounter for screening mammogram for malignant neoplasm of breast: Secondary | ICD-10-CM | POA: Diagnosis not present

## 2020-12-01 ENCOUNTER — Ambulatory Visit: Payer: Medicare Other | Admitting: Family Medicine

## 2020-12-12 ENCOUNTER — Encounter: Payer: Medicare Other | Admitting: Family Medicine

## 2020-12-21 ENCOUNTER — Telehealth: Payer: Self-pay | Admitting: Gastroenterology

## 2020-12-21 DIAGNOSIS — K449 Diaphragmatic hernia without obstruction or gangrene: Secondary | ICD-10-CM

## 2020-12-21 DIAGNOSIS — K2900 Acute gastritis without bleeding: Secondary | ICD-10-CM

## 2020-12-21 DIAGNOSIS — K219 Gastro-esophageal reflux disease without esophagitis: Secondary | ICD-10-CM

## 2020-12-21 MED ORDER — PANTOPRAZOLE SODIUM 40 MG PO TBEC: 40.0000 mg | DELAYED_RELEASE_TABLET | Freq: Two times a day (BID) | ORAL | 11 refills | Status: DC

## 2020-12-21 NOTE — Telephone Encounter (Signed)
Rx for pantoprazole sent to pharmacy as requested.  

## 2020-12-21 NOTE — Telephone Encounter (Signed)
Inbound call from patient. Need medication refill for pantoprazole.

## 2021-01-01 ENCOUNTER — Other Ambulatory Visit (HOSPITAL_BASED_OUTPATIENT_CLINIC_OR_DEPARTMENT_OTHER): Payer: Self-pay

## 2021-01-01 ENCOUNTER — Ambulatory Visit: Payer: Medicare Other | Attending: Internal Medicine

## 2021-01-01 DIAGNOSIS — Z23 Encounter for immunization: Secondary | ICD-10-CM

## 2021-01-01 MED ORDER — PFIZER COVID-19 VAC BIVALENT 30 MCG/0.3ML IM SUSP
INTRAMUSCULAR | 0 refills | Status: DC
  Filled 2021-01-01: qty 0.3, 1d supply, fill #0

## 2021-01-01 NOTE — Progress Notes (Signed)
   Covid-19 Vaccination Clinic  Name:  Cassandria Drew    MRN: [id number] DOB: 1945/05/19  01/01/2021  Ms. Hyneman was observed post Covid-19 immunization for 15 minutes without incident. She was provided with Vaccine Information Sheet and instruction to access the V-Safe system.   Ms. Hegler was instructed to call 911 with any severe reactions post vaccine: Difficulty breathing  Swelling of face and throat  A fast heartbeat  A bad rash all over body  Dizziness and weakness

## 2021-01-02 ENCOUNTER — Encounter: Payer: Self-pay | Admitting: Gastroenterology

## 2021-01-15 ENCOUNTER — Ambulatory Visit (HOSPITAL_BASED_OUTPATIENT_CLINIC_OR_DEPARTMENT_OTHER): Payer: Medicare Other | Admitting: Family Medicine

## 2021-01-17 ENCOUNTER — Encounter (HOSPITAL_BASED_OUTPATIENT_CLINIC_OR_DEPARTMENT_OTHER): Payer: Self-pay | Admitting: Family Medicine

## 2021-01-17 ENCOUNTER — Ambulatory Visit (INDEPENDENT_AMBULATORY_CARE_PROVIDER_SITE_OTHER): Payer: Medicare Other | Admitting: Family Medicine

## 2021-01-17 ENCOUNTER — Other Ambulatory Visit: Payer: Self-pay

## 2021-01-17 VITALS — BP 160/80 | HR 68 | Ht 66.5 in | Wt 166.4 lb

## 2021-01-17 DIAGNOSIS — K219 Gastro-esophageal reflux disease without esophagitis: Secondary | ICD-10-CM | POA: Diagnosis not present

## 2021-01-17 DIAGNOSIS — Z23 Encounter for immunization: Secondary | ICD-10-CM

## 2021-01-17 DIAGNOSIS — N1831 Chronic kidney disease, stage 3a: Secondary | ICD-10-CM | POA: Diagnosis not present

## 2021-01-17 DIAGNOSIS — I10 Essential (primary) hypertension: Secondary | ICD-10-CM | POA: Diagnosis not present

## 2021-01-17 NOTE — Patient Instructions (Signed)
  Medication Instructions:  Your physician recommends that you continue on your current medications as directed. Please refer to the Current Medication list given to you today. --If you need a refill on any your medications before your next appointment, please call your pharmacy first. If no refills are authorized on file call the office.-- Lab Work: Your physician has recommended that you have lab work today: BMET If you have labs (blood work) drawn today and your tests are completely normal, you will receive your results via [place] a phone call from our staff.  Please ensure you check your voicemail in the event that you authorized detailed messages to be left on a delegated number. If you have any lab test that is abnormal or we need to change your treatment, we will call you to review the results.  Follow-Up: Your next appointment:   Your physician recommends that you schedule a follow-up appointment in: 22 WEEKS with Dr. de [place]  You will receive a text message or e-mail with a link to a survey about your care and experience with [place] today! We would greatly appreciate your feedback!   Thanks for letting us be apart of your health journey!!  Primary Care and Sports Medicine   Dr. Arlina Robes [place]   We encourage you to activate your patient portal called "MyChart".  Sign up information is provided on this After Visit Summary.  MyChart is used to connect with patients for Virtual Visits (Telemedicine).  Patients are able to view lab/test results, encounter notes, upcoming appointments, etc.  Non-urgent messages can be sent to your provider as well. To learn more about what you can do with MyChart, please visit --  [email or web address].

## 2021-01-17 NOTE — Assessment & Plan Note (Addendum)
Has been present on labs for at least 3 to 5 years, no recent significant changes in eGFR Will recheck labs today to assess current status and monitor Patient aware of renal protective measures including avoidance of NSAIDs, ensuring adequate hydration

## 2021-01-17 NOTE — Assessment & Plan Note (Signed)
Blood pressure slightly elevated in office today Review of chart indicates control at prior office visits No medication changes at this time Recommend DASH diet Recommend intermittent monitoring at home

## 2021-01-17 NOTE — Progress Notes (Signed)
New Patient Office Visit  Subjective:  Patient ID: Sara [name], female    DOB: [birth year]  Age: 75 y.o. MRN: [id number]  CC:  Chief Complaint  Patient presents with   Establish Care    Prior PCPO - Dr. Rogers Blocker (notes in [place]).   Hiatal Hernia    Patient has hx of hiatal hernia that she is still having pain from . She was initially dx in 2011 and feels it never resolved. She plans to see GI in Nov   Anxiety    Patient is requesting to stop sertraline. She states she was in a "crisis" with her daughters substance abuse and was more anxious than usually but has since gotten better and doesn't need the medication anymore.    HPI [name] is a 75 year old female presenting to establish in clinic.  She has current concerns as outlined above.  Past medical history significant for hypertension, hyperlipidemia, GERD, palpitations.  Hypertension: Currently only taking verapamil which was started related to palpitations.  Denies any issue currently related to headaches, lightheadedness, dizziness, chest pain.  Anxiety: Had previously been started on SSRI.  Reports that she had increased symptoms due to her ongoing situation with her daughter who is undergoing treatment for substance abuse.  This situation has passed and she reports that symptoms are much improved.  She has questions today regarding discontinuation of SSRI.  Epigastric pain, reflux: Reports that she has had increased symptoms recently.  Did reach out to GI office and her daily PPI dose was increased.  She has follow-up with GI next month and plans to discuss with them further at that time.  Palpitations: Had Holter monitor with events recorded and was subsequently referred to cardiology for further evaluation.  She has continued follow-up with cardiology.  She was started on verapamil, tolerating well.  Follow-up with cardiology is next month.  Past Medical History:  Diagnosis Date   Anxiety 07/2020   Arthritis  11/2017   Barrett's esophagus    Cataract    had surgery   GERD (gastroesophageal reflux disease)    hx of    HIATAL HERNIA    s/p surgical repair   Hyperlipidemia    HYPERTENSION    Kidney stone    1990s   Osteoporosis     Past Surgical History:  Procedure Laterality Date   ABDOMINAL HYSTERECTOMY  06/12/2012   CATARACT EXTRACTION  2009   both eyes   CHOLECYSTECTOMY  11/02/2012   Procedure: CHOLECYSTECTOMY;  Surgeon: Pedro Earls, MD;  Location: [facility];  Service: General;;   CYSTOCELE REPAIR N/A 06/12/2012   Procedure: ANTERIOR REPAIR (CYSTOCELE);  Surgeon: Elveria Royals, MD;  Location: [facility];  Service: Gynecology;  Laterality: N/A;   EYE SURGERY  Cataract removal 2009   HERNIA REPAIR  2011   HIATAL HERNIA REPAIR  2011   LITHOTRIPSY     TONSILLECTOMY  1968   UPPER GASTROINTESTINAL ENDOSCOPY     UPPER GI ENDOSCOPY  11/02/2012   Procedure: UPPER GI ENDOSCOPY;  Surgeon: Pedro Earls, MD;  Location: [facility];  Service: General;;   VAGINAL HYSTERECTOMY N/A 06/12/2012   Procedure: HYSTERECTOMY VAGINAL;  Surgeon: Elveria Royals, MD;  Location: [facility];  Service: Gynecology;  Laterality: N/A;    Family History  Problem Relation Age of Onset   Breast cancer Mother 23       with mets    Cancer Mother    Hypertension Mother    Early death  Father    Heart disease Father    Kidney disease Father    Hypertension Other        Parent   Breast cancer Other    Heart disease Other        parent, grandparent   Colon cancer Paternal Aunt    Breast cancer Maternal Grandmother    Asthma Maternal Grandmother    Cancer Maternal Grandmother    Cancer Paternal Uncle    Early death Paternal Uncle    Cancer Paternal Aunt    Esophageal cancer Neg Hx    Rectal cancer Neg Hx    Stomach cancer Neg Hx    Colon polyps Neg Hx     Social History   Socioeconomic History   Marital status: Widowed    Spouse name: Not on file   Number of children: 5   Years of education: 14    Highest education level: Not on file  Occupational History   Occupation: Retired  Tobacco Use   Smoking status: Never   Smokeless tobacco: Never  Vaping Use   Vaping Use: Never used  Substance and Sexual Activity   Alcohol use: Never   Drug use: Never   Sexual activity: Not Currently    Birth control/protection: Post-menopausal    Comment: Hysterectomy  Other Topics Concern   Not on file  Social History Narrative   widowed since late 2011 (spouse had mod-adv dementia) 5 grown children, 3 nearby-,many g-kids   Social Determinants of Radio broadcast assistant Strain: Not on file  Food Insecurity: Not on file  Transportation Needs: Not on file  Physical Activity: Not on file  Stress: Not on file  Social Connections: Not on file  Intimate Partner Violence: Not on file    Objective:   Today's Vitals: BP (!) 160/80   Pulse 68   Ht 5' 6.5" (1.689 m)   Wt 166 lb 6.4 oz (75.5 kg)   LMP  (LMP Unknown)   SpO2 100%   BMI 26.46 kg/m   Physical Exam  75 year old female in no acute distress Cardiovascular exam with regular rate and rhythm, no murmurs appreciated Lungs clear to auscultation bilaterally  Assessment & Plan:   Problem List Items Addressed This Visit       Cardiovascular and Mediastinum   Essential hypertension - Primary    Blood pressure slightly elevated in office today Review of chart indicates control at prior office visits No medication changes at this time Recommend DASH diet Recommend intermittent monitoring at home      Relevant Orders   Basic Metabolic Panel (BMET)     Digestive   GERD    Having some increased epigastric pain recently Was increased to high dose of PPI by GI recently, still with some symptoms despite this Does have follow-up next month with GI, recommend discussing further with them regarding need for any further evaluation, intervention Continue with PPI at this time        Genitourinary   CKD (chronic kidney disease)  stage 3, GFR 30-59 ml/min (HCC)    Has been present on labs for at least 3 to 5 years, no recent significant changes in eGFR Will recheck labs today to assess current status and monitor Patient aware of renal protective measures including avoidance of NSAIDs, ensuring adequate hydration      Relevant Orders   Basic Metabolic Panel (BMET)     Other   Need for influenza vaccination    Patient interested in  influenza vaccine, administered today      Other Visit Diagnoses     Encounter for immunization       Relevant Orders   Flu Vaccine QUAD High Dose(Fluad)       Outpatient Encounter Medications as of 01/17/2021  Medication Sig   Calcium Carbonate-Vitamin D (CALCIUM-VITAMIN D) 500-200 MG-UNIT per tablet Take 1 tablet by mouth 2 (two) times daily with a meal.   Cyanocobalamin (VITAMIN B-12 CR PO) Take 4,000 Units by mouth daily.    Multiple Vitamin (MULTIVITAMIN WITH MINERALS) TABS Take 1 tablet by mouth daily.   pantoprazole (PROTONIX) 40 MG tablet Take 1 tablet (40 mg total) by mouth 2 (two) times daily before a meal. Take 20-30 minutes before breakfast and dinner meals.   Probiotic Product (PROBIOTIC DAILY PO) Take by mouth daily.   rosuvastatin (CRESTOR) 5 MG tablet TAKE 1 TABLET BY MOUTH EVERY DAY   sertraline (ZOLOFT) 50 MG tablet Take 1 tablet (50 mg total) by mouth daily.   verapamil (CALAN-SR) 180 MG CR tablet Take 1 tablet (180 mg total) by mouth daily.   [DISCONTINUED] COVID-19 mRNA bivalent vaccine, Pfizer, (PFIZER COVID-19 VAC BIVALENT) injection Inject into the muscle.   No facility-administered encounter medications on file as of 01/17/2021.   Spent 45 minutes on this patient encounter, including preparation, chart review, face-to-face counseling with patient and coordination of care, and documentation of encounter  Follow-up: Return in about 6 weeks (around 02/28/2021) for Follow Up.  Plan for follow-up on recent visits with GI, cardiology.  Courtnee Myer J De [place],  MD

## 2021-01-17 NOTE — Assessment & Plan Note (Signed)
Patient interested in influenza vaccine, administered today

## 2021-01-17 NOTE — Assessment & Plan Note (Signed)
Having some increased epigastric pain recently Was increased to high dose of PPI by GI recently, still with some symptoms despite this Does have follow-up next month with GI, recommend discussing further with them regarding need for any further evaluation, intervention Continue with PPI at this time

## 2021-01-18 LAB — BASIC METABOLIC PANEL
BUN/Creatinine Ratio: 13 (ref 12–28)
BUN: 14 mg/dL (ref 8–27)
CO2: 25 mmol/L (ref 20–29)
Calcium: 9.7 mg/dL (ref 8.7–10.3)
Chloride: 102 mmol/L (ref 96–106)
Creatinine, Ser: 1.08 mg/dL — ABNORMAL HIGH (ref 0.57–1.00)
Glucose: 88 mg/dL (ref 70–99)
Potassium: 4.9 mmol/L (ref 3.5–5.2)
Sodium: 140 mmol/L (ref 134–144)
eGFR: 54 mL/min/{1.73_m2} — ABNORMAL LOW (ref >59)

## 2021-01-30 ENCOUNTER — Encounter: Payer: Self-pay | Admitting: Gastroenterology

## 2021-02-12 DIAGNOSIS — I471 Supraventricular tachycardia: Secondary | ICD-10-CM | POA: Insufficient documentation

## 2021-02-12 DIAGNOSIS — I491 Atrial premature depolarization: Secondary | ICD-10-CM | POA: Insufficient documentation

## 2021-02-19 ENCOUNTER — Ambulatory Visit: Payer: Medicare Other | Admitting: Internal Medicine

## 2021-02-19 DIAGNOSIS — I471 Supraventricular tachycardia: Secondary | ICD-10-CM

## 2021-02-19 DIAGNOSIS — I491 Atrial premature depolarization: Secondary | ICD-10-CM

## 2021-02-22 NOTE — Progress Notes (Signed)
Cardiology Office Note Date:  02/22/2021  Patient ID:  Sara [name], Sara [name] [birth year], MRN [id number] PCP:  de [place], Raymond J, MD  Electrophysiologist: Dr. Caryl Comes   Chief Complaint:  planned f/u  History of Present Illness: Sara [name] is a 75 y.o. female with history of barrett's esophagus, GERD, hiatal hernia, HTN, HLD, palpitations, PACs  She comes in today to be seen for Dr. Caryl Comes, referred to him June 2022 by he PMD, noted a monitor she wore with: " burden of PACs and couplets to the tune of about 90% of her total beats but also frequent runs of SVT (417) the longest lasting 2 minutes and the fastest heart rates in the 250 range.  1 of these episodes was associated with the aforementioned symptoms of chest discomfort and shortness of breath and not associated with palpitations.  She also had multiple episodes of tachycardia during waking hours that were not associated with device activation.  This not withstanding heart rates greater than 200s"  Wrapped up : frequent episodes of irregular rapid atrial tachycardia not clearly atrial fibrillation as there are intermittent clearly discernible P waves Planned to stop amlodipine and start verapamil Get an echo and CTa vs stress test No clear AFib, not started on a/c.  She saw A. Tillery, PA-C August for follow up, Dr. Caryl Comes felt Brantley Fling was without ischemia, LVEF by her echo preserved 55-60%, asymmetric LVH ?HCM . C.mRI noted mild asymmetrical LVH NOT c/w HCM  Planned to continue verapamil with some improvement (not resolved) palpitations, discussed perhaps dilt vs flecainide   TODAY She is feeling better. Rare awareness of palpitations, these can sometimes be associated with a slight dizziness, though much improved then prior to being on verapamil. She has some chest heaviness this also pre-dates verapamil and is also better, but not resolved This is exertional, not with ADLs but with heavier exertion like carrying things,  moving furniture to clean, is is associated with SOB and resolves fairly quickly with rest. She likes to walk for exercise, prefers the park with benches that she can rest on when her back hurts or feels SOB (this not new) She will occ feel lightheaded when she first gets up in the AM No syncope or near syncope   Past Medical History:  Diagnosis Date   Anxiety 07/2020   Arthritis 11/2017   Barrett's esophagus    Cataract    had surgery   GERD (gastroesophageal reflux disease)    hx of    HIATAL HERNIA    s/p surgical repair   Hyperlipidemia    HYPERTENSION    Kidney stone    1990s   Osteoporosis     Past Surgical History:  Procedure Laterality Date   ABDOMINAL HYSTERECTOMY  06/12/2012   CATARACT EXTRACTION  2009   both eyes   CHOLECYSTECTOMY  11/02/2012   Procedure: CHOLECYSTECTOMY;  Surgeon: Pedro Earls, MD;  Location: [facility];  Service: General;;   CYSTOCELE REPAIR N/A 06/12/2012   Procedure: ANTERIOR REPAIR (CYSTOCELE);  Surgeon: Elveria Royals, MD;  Location: [facility];  Service: Gynecology;  Laterality: N/A;   EYE SURGERY  Cataract removal 2009   HERNIA REPAIR  2011   HIATAL HERNIA REPAIR  2011   LITHOTRIPSY     TONSILLECTOMY  1968   UPPER GASTROINTESTINAL ENDOSCOPY     UPPER GI ENDOSCOPY  11/02/2012   Procedure: UPPER GI ENDOSCOPY;  Surgeon: Pedro Earls, MD;  Location: [facility];  Service: General;;  VAGINAL HYSTERECTOMY N/A 06/12/2012   Procedure: HYSTERECTOMY VAGINAL;  Surgeon: Elveria Royals, MD;  Location: [facility];  Service: Gynecology;  Laterality: N/A;    Current Outpatient Medications  Medication Sig Dispense Refill   Calcium Carbonate-Vitamin D (CALCIUM-VITAMIN D) 500-200 MG-UNIT per tablet Take 1 tablet by mouth 2 (two) times daily with a meal.     Cyanocobalamin (VITAMIN B-12 CR PO) Take 4,000 Units by mouth daily.      Multiple Vitamin (MULTIVITAMIN WITH MINERALS) TABS Take 1 tablet by mouth daily.     pantoprazole (PROTONIX) 40 MG tablet Take 1  tablet (40 mg total) by mouth 2 (two) times daily before a meal. Take 20-30 minutes before breakfast and dinner meals. 60 tablet 11   Probiotic Product (PROBIOTIC DAILY PO) Take by mouth daily.     rosuvastatin (CRESTOR) 5 MG tablet TAKE 1 TABLET BY MOUTH EVERY DAY 90 tablet 3   sertraline (ZOLOFT) 50 MG tablet Take 1 tablet (50 mg total) by mouth daily. 90 tablet 3   verapamil (CALAN-SR) 180 MG CR tablet Take 1 tablet (180 mg total) by mouth daily. 90 tablet 3   No current facility-administered medications for this visit.    Allergies:   Augmentin [amoxicillin-pot clavulanate]   Social History:  The patient  reports that she has never smoked. She has never used smokeless tobacco. She reports that she does not drink alcohol and does not use drugs.   Family History:  The patient's family history includes Asthma in her maternal grandmother; Breast cancer in her maternal grandmother and another family member; Breast cancer (age of onset: 92) in her mother; Cancer in her maternal grandmother, mother, paternal aunt, and paternal uncle; Colon cancer in her paternal aunt; Early death in her father and paternal uncle; Heart disease in her father and another family member; Hypertension in her mother and another family member; Kidney disease in her father.  ROS:  Please see the history of present illness.    All other systems are reviewed and otherwise negative.   PHYSICAL EXAM:  VS:  LMP  (LMP Unknown)  BMI: There is no height or weight on file to calculate BMI. Well nourished, well developed, in no acute distress HEENT: normocephalic, atraumatic Neck: no JVD, carotid bruits or masses Cardiac:  RRR; no significant murmurs, no rubs, or gallops Lungs:  CTA b/l, no wheezing, rhonchi or rales Abd: soft, nontender MS: no deformity or atrophy Ext: no edema Skin: warm and dry, no rash Neuro:  No gross deficits appreciated Psych: euthymic mood, full affect    EKG:  done today and reviewed by  myself SR 60bpm, no ischemic changes  10/31/2020: c.MRI IMPRESSION: 1. Asymmetric LV hypertrophy measuring 83mm in basal septum (65mm in posterior wall). Does not meet criteria for hypertrophic cardiomyopathy (less than 65mm wall thickness)   2.  No lata gadolinium enhancement to suggest myocardial scar   3.  Normal LV size and systolic function (EF 16%)   4.  Normal RV size and systolic function (EF 01%)   5.  Large hiatal hernia   10/10/2020: TTE IMPRESSIONS   1. Left ventricular ejection fraction, by estimation, is 55 to 60%. Left  ventricular ejection fraction by 3D volume is 57 %. The left ventricle has  normal function. The left ventricle has no regional wall motion  abnormalities. There is moderate  asymmetric left ventricular hypertrophy of the basal-septal segment. Left  ventricular diastolic parameters are indeterminate. The average left  ventricular global longitudinal strain  is -21.2 %.   2. Right ventricular systolic function is normal. The right ventricular  size is normal. There is normal pulmonary artery systolic pressure. The  estimated right ventricular systolic pressure is 89.3 mmHg.   3. The mitral valve is normal in structure. Trivial mitral valve  regurgitation. No evidence of mitral stenosis.   4. The aortic valve is tricuspid. Aortic valve regurgitation is not  visualized. Mild aortic valve sclerosis is present, with no evidence of  aortic valve stenosis.   5. Aortic dilatation noted. There is mild dilatation of the ascending  aorta, measuring 38 mm.   6. The inferior vena cava is normal in size with greater than 50%  respiratory variability, suggesting right atrial pressure of 3 mmHg.    10/10/2020: stress myoview The left ventricular ejection fraction is normal (55-65%). Nuclear stress EF: 60%. There was no ST segment deviation noted during stress. Defect 1: There is a medium defect of severe severity present in the basal inferoseptal, basal  inferior, mid inferoseptal and mid inferior location. Findings consistent with prior myocardial infarction with peri-infarct ischemia. This is a low risk study.   Abnormal, low risk stress nuclear study with prior inferior infarct and minimal peri-infarct ischemia.  Gated ejection fraction 60% with hypokinesis of the basal inferior wall.  Recent Labs: 07/24/2020: ALT 11; Hemoglobin 14.6; Platelets 240.0; TSH 0.83 01/17/2021: BUN 14; Creatinine, Ser 1.08; Potassium 4.9; Sodium 140  05/31/2020: Cholesterol 126; HDL 53.90; LDL Cholesterol 53; Total CHOL/HDL Ratio 2; Triglycerides 98.0; VLDL 19.6   CrCl cannot be calculated (Patient's most recent lab result is older than the maximum 21 days allowed.).   Wt Readings from Last 3 Encounters:  01/17/21 166 lb 6.4 oz (75.5 kg)  11/06/20 165 lb 12.8 oz (75.2 kg)  10/10/20 165 lb (74.8 kg)     Other studies reviewed: Additional studies/records reviewed today include: summarized above  ASSESSMENT AND PLAN:  Palpitations PACs, AT Much better on CCB  HTN Better at home, no changes  CP She has exertional symptoms, they are improved on CCB but ot resolved Myoview had infarct  with peri infarct ischemia, felt to be low risk, though with symptoms, ore eval I think is needed Will plan for coronary CT/FFR if indicated  Disposition: F/u with [place] in 63mo,  sooner if needed  Current medicines are reviewed at length with the patient today.  The patient did not have any concerns regarding medicines.  Venetia Night, PA-C 02/22/2021 6:30 AM     [facility] [address] [place] [address] [phone] (office)  [phone] (fax)

## 2021-02-23 ENCOUNTER — Other Ambulatory Visit: Payer: Self-pay

## 2021-02-23 ENCOUNTER — Other Ambulatory Visit: Payer: Self-pay | Admitting: *Deleted

## 2021-02-23 ENCOUNTER — Encounter: Payer: Self-pay | Admitting: Physician Assistant

## 2021-02-23 ENCOUNTER — Ambulatory Visit: Payer: Medicare Other | Admitting: Physician Assistant

## 2021-02-23 VITALS — BP 150/62 | HR 60 | Ht 66.0 in | Wt 169.0 lb

## 2021-02-23 DIAGNOSIS — R079 Chest pain, unspecified: Secondary | ICD-10-CM | POA: Diagnosis not present

## 2021-02-23 DIAGNOSIS — I491 Atrial premature depolarization: Secondary | ICD-10-CM | POA: Diagnosis not present

## 2021-02-23 DIAGNOSIS — I471 Supraventricular tachycardia: Secondary | ICD-10-CM

## 2021-02-23 DIAGNOSIS — I10 Essential (primary) hypertension: Secondary | ICD-10-CM | POA: Diagnosis not present

## 2021-02-23 DIAGNOSIS — Z01818 Encounter for other preprocedural examination: Secondary | ICD-10-CM

## 2021-02-23 MED ORDER — METOPROLOL TARTRATE 100 MG PO TABS: 100.0000 mg | ORAL_TABLET | Freq: Once | ORAL | 0 refills | Status: DC

## 2021-02-23 NOTE — Patient Instructions (Addendum)
Medication Instructions:   Your physician recommends that you continue on your current medications as directed. Please refer to the Current Medication list given to you today.   ON DAY OF PROCEDURE  ONLY:  HOLD VERAPAMIL AND TAKE METOPROLOL 100 MG TABLET ONCE DOSE TWO HOURS  PRIOR TO SCHEDULE PROCEDURE.  *If you need a refill on your cardiac medications before your next appointment, please call your pharmacy*   Lab Work: [place]   If you have labs (blood work) drawn today and your tests are completely normal, you will receive your results only by: [place] (if you have MyChart) OR A paper copy in the mail If you have any lab test that is abnormal or we need to change your treatment, we will call you to review the results.   Testing/Procedures: Non-Cardiac CT Angiography (CTA), is a special type of CT scan that uses a computer to produce multi-dimensional views of major blood vessels throughout the body. In CT angiography, a contrast material is injected through an IV to help visualize the blood vessels   Follow-Up: At [facility], you and your health needs are our priority.  As part of our continuing mission to provide you with exceptional heart care, we have created designated Provider Care Teams.  These Care Teams include your primary Cardiologist (physician) and Advanced Practice Providers (APPs -  Physician Assistants and Nurse Practitioners) who all work together to provide you with the care you need, when you need it.  We recommend signing up for the patient portal called "MyChart".  Sign up information is provided on this After Visit Summary.  MyChart is used to connect with patients for Virtual Visits (Telemedicine).  Patients are able to view lab/test results, encounter notes, upcoming appointments, etc.  Non-urgent messages can be sent to your provider as well.   To learn more about what you can do with MyChart, go to [email or web address].    Your next  appointment:   2 month(s) ( CONTACT [facility] FOR EP SCHEDULING ISSUES )   The format for your next appointment:   In Person  Provider:   You may see Virl Axe, MD or one of the following Advanced Practice Providers on your designated Care Team:   Tommye Standard, PA-C :1}    Other Instructions   Your cardiac CT will be scheduled at one of the below locations:   [facility] [address] [place], [place] [address] [phone]  [place] [address] [place], [place] [address] [phone]  If scheduled at [facility], please arrive at the [facility] main entrance (entrance A) of [facility] 30 minutes prior to test start time. You can use the FREE valet parking offered at the main entrance (encouraged to control the heart rate for the test) Proceed to the [facility] Radiology Department (first floor) to check-in and test prep.  If scheduled at [facility], please arrive 15 mins early for check-in and test prep.  Please follow these instructions carefully (unless otherwise directed):    On the Night Before the Test: Be sure to Drink plenty of water. Do not consume any caffeinated/decaffeinated beverages or chocolate 12 hours prior to your test. Do not take any antihistamines 12 hours prior to your test.  On the Day of the Test: Drink plenty of water until 1 hour prior to the test. Do not eat any food 4 hours prior to the  test. You may take your regular medications prior to the test.  Take metoprolol (Lopressor) two hours prior to test. HOLD Furosemide/Hydrochlorothiazide morning of the test. FEMALES- please wear underwire-free bra if available, avoid dresses & tight clothing  After the Test: Drink plenty of water. After receiving IV contrast, you may experience a mild flushed feeling. This is normal. On occasion, you may experience a mild rash up to 24  hours after the test. This is not dangerous. If this occurs, you can take Benadryl 25 mg and increase your fluid intake. If you experience trouble breathing, this can be serious. If it is severe call 911 IMMEDIATELY. If it is mild, please call our office. If you take any of these medications: Glipizide/Metformin, Avandament, Glucavance, please do not take 48 hours after completing test unless otherwise instructed.  Please allow 2-4 weeks for scheduling of routine cardiac CTs. Some insurance companies require a pre-authorization which may delay scheduling of this test.   For non-scheduling related questions, please contact the cardiac imaging nurse navigator should you have any questions/concerns: Marchia Bond, Cardiac Imaging Nurse Navigator Gordy Clement, Cardiac Imaging Nurse Navigator [facility] Direct Office Dial: [phone]   For scheduling needs, including cancellations and rescheduling, please call [place], [phone].

## 2021-02-27 ENCOUNTER — Encounter (HOSPITAL_BASED_OUTPATIENT_CLINIC_OR_DEPARTMENT_OTHER): Payer: Self-pay | Admitting: Family Medicine

## 2021-02-27 ENCOUNTER — Other Ambulatory Visit (HOSPITAL_BASED_OUTPATIENT_CLINIC_OR_DEPARTMENT_OTHER): Payer: Self-pay

## 2021-02-27 ENCOUNTER — Ambulatory Visit (INDEPENDENT_AMBULATORY_CARE_PROVIDER_SITE_OTHER): Payer: Medicare Other | Admitting: Family Medicine

## 2021-02-27 ENCOUNTER — Other Ambulatory Visit: Payer: Self-pay

## 2021-02-27 DIAGNOSIS — J329 Chronic sinusitis, unspecified: Secondary | ICD-10-CM

## 2021-02-27 DIAGNOSIS — B9789 Other viral agents as the cause of diseases classified elsewhere: Secondary | ICD-10-CM

## 2021-02-27 MED ORDER — PANTOPRAZOLE SODIUM 40 MG PO TBEC
40.0000 mg | DELAYED_RELEASE_TABLET | ORAL | 2 refills | Status: DC
Start: 2020-12-21 — End: 2021-06-22
  Filled 2021-03-12: qty 180, 90d supply, fill #0
  Filled 2021-06-04: qty 180, 90d supply, fill #1

## 2021-02-27 MED ORDER — METOPROLOL TARTRATE 100 MG PO TABS: ORAL_TABLET | ORAL | 0 refills | Status: DC

## 2021-02-27 NOTE — Assessment & Plan Note (Signed)
Patient presents with 3-day history of sinus pressure, congestion, postnasal drip.  Symptoms began Sunday.  She denies any associated fever, chills, cough, myalgias.  She has not had any shortness of breath or trouble with breathing.  Generally, she does feel improved today compared to prior days She has some concerns given her chronic medical conditions and what she would be able to take in regards to over-the-counter medications She did use at home coronavirus test at symptom onset which was negative. On exam, cardiovascular exam with regular rate, no murmurs appreciated.  Lungs clear to auscultation bilaterally.  Tenderness to palpation over maxillary sinuses, less so over frontal sinuses. Suspect current symptoms are related to acute sinusitis with likely viral etiology.  Discussed this with patient Discussed appropriate over-the-counter medications to utilize.  Can use Tylenol for any sinus pressure/pain, would avoid NSAIDs given cardiac history as well as underlying kidney dysfunction. Recommend intranasal saline spray to help with sinus pressure/congestion.  Can consider use of intranasal corticosteroid spray as well Recommend ensuring adequate hydration, adequate rest Expect that symptoms should improve as the week progresses, did discuss that if she develops cough, this can linger despite resolution of other symptoms Could consider retesting for coronavirus 48 hours after prior test If she notes worsening of symptoms, advised to contact the office and is possible viral sinusitis could transition to bacterial sinusitis. Also discussed recommendation that if she has any trouble with breathing, shortness of breath, to present to emergency department for evaluation

## 2021-02-27 NOTE — Progress Notes (Signed)
    Procedures performed today:    None.  Independent interpretation of notes and tests performed by another provider:   None.  Brief History, Exam, Impression, and Recommendations:    BP (!) 158/80   Pulse 76   Ht 5\' 6"  (1.676 m)   Wt 168 lb (76.2 kg)   LMP  (LMP Unknown)   SpO2 100%   BMI 27.12 kg/m   Viral sinusitis Patient presents with 3-day history of sinus pressure, congestion, postnasal drip.  Symptoms began Sunday.  She denies any associated fever, chills, cough, myalgias.  She has not had any shortness of breath or trouble with breathing.  Generally, she does feel improved today compared to prior days She has some concerns given her chronic medical conditions and what she would be able to take in regards to over-the-counter medications She did use at home coronavirus test at symptom onset which was negative. On exam, cardiovascular exam with regular rate, no murmurs appreciated.  Lungs clear to auscultation bilaterally.  Tenderness to palpation over maxillary sinuses, less so over frontal sinuses. Suspect current symptoms are related to acute sinusitis with likely viral etiology.  Discussed this with patient Discussed appropriate over-the-counter medications to utilize.  Can use Tylenol for any sinus pressure/pain, would avoid NSAIDs given cardiac history as well as underlying kidney dysfunction. Recommend intranasal saline spray to help with sinus pressure/congestion.  Can consider use of intranasal corticosteroid spray as well Recommend ensuring adequate hydration, adequate rest Expect that symptoms should improve as the week progresses, did discuss that if she develops cough, this can linger despite resolution of other symptoms Could consider retesting for coronavirus 48 hours after prior test If she notes worsening of symptoms, advised to contact the office and is possible viral sinusitis could transition to bacterial sinusitis. Also discussed recommendation that if she  has any trouble with breathing, shortness of breath, to present to emergency department for evaluation  Plan for follow-up at previously scheduled appointment   ___________________________________________ Zyden Suman de [place], MD, ABFM, [facility] [place]

## 2021-02-27 NOTE — Patient Instructions (Signed)
  Medication Instructions:  Your physician recommends that you continue on your current medications as directed. Please refer to the Current Medication list given to you today. --If you need a refill on any your medications before your next appointment, please call your pharmacy first. If no refills are authorized on file call the office.-- Follow-Up: Your next appointment:   Your physician recommends that you schedule a follow-up appointment in: January with Dr. de [place]  You will receive a text message or e-mail with a link to a survey about your care and experience with [place] today! We would greatly appreciate your feedback!   Thanks for letting us be apart of your health journey!!  Primary Care and Sports Medicine   Dr. Arlina Robes [place]   We encourage you to activate your patient portal called "MyChart".  Sign up information is provided on this After Visit Summary.  MyChart is used to connect with patients for Virtual Visits (Telemedicine).  Patients are able to view lab/test results, encounter notes, upcoming appointments, etc.  Non-urgent messages can be sent to your provider as well. To learn more about what you can do with MyChart, please visit --  [email or web address].

## 2021-03-07 ENCOUNTER — Ambulatory Visit (HOSPITAL_BASED_OUTPATIENT_CLINIC_OR_DEPARTMENT_OTHER): Payer: Medicare Other | Admitting: Family Medicine

## 2021-03-12 ENCOUNTER — Other Ambulatory Visit (HOSPITAL_BASED_OUTPATIENT_CLINIC_OR_DEPARTMENT_OTHER): Payer: Self-pay

## 2021-03-13 ENCOUNTER — Other Ambulatory Visit (HOSPITAL_BASED_OUTPATIENT_CLINIC_OR_DEPARTMENT_OTHER): Payer: Self-pay

## 2021-03-13 ENCOUNTER — Other Ambulatory Visit: Payer: Self-pay

## 2021-03-13 ENCOUNTER — Ambulatory Visit: Payer: Medicare Other

## 2021-03-13 VITALS — Ht 66.0 in | Wt 165.7 lb

## 2021-03-13 DIAGNOSIS — Z86010 Personal history of colonic polyps: Secondary | ICD-10-CM

## 2021-03-13 MED ORDER — NA SULFATE-K SULFATE-MG SULF 17.5-3.13-1.6 GM/177ML PO SOLN
1.0000 | Freq: Once | ORAL | 0 refills | Status: AC
  Filled 2021-03-13: qty 354, 2d supply, fill #0

## 2021-03-13 NOTE — Progress Notes (Signed)
° °  Patient's pre-visit was done today over the phone with the patient   Name,DOB and address verified.   Patient denies any allergies to Eggs and Soy.  Patient denies any problems with anesthesia/sedation. Patient denies taking diet pills or blood thinners.  Denies atrial flutter or atrial fib Denies chronic constipation No home Oxygen.   Packet of Prep instructions mailed to patient including a copy of a consent form-pt is aware.  Patient understands to call us back with any questions or concerns.  Patient is aware of our care-partner policy and GPQDI-26 safety protocol.   EMMI education assigned to the patient for the procedure, sent to [place].   The patient is COVID-19 vaccinated.    On PV pt notes that she had an MRI, but PCP is doing a CTA to rule out issues related to morning dizziness.  Test is 1/4.  Pt is to call [facility] on 1/5 to let us know the outcome of the procedure.

## 2021-03-15 ENCOUNTER — Telehealth: Payer: Self-pay | Admitting: Gastroenterology

## 2021-03-15 NOTE — Telephone Encounter (Signed)
Inbound call from patient states she have questions about some her after visit summary from her previsit. Requesting a call back to clarify information/

## 2021-03-15 NOTE — Telephone Encounter (Signed)
Pt states she has a hx of palpitations- not AFib or flutter - wanted to clarify- noted   Sara [name] PV

## 2021-03-20 ENCOUNTER — Telehealth: Payer: Self-pay | Admitting: *Deleted

## 2021-03-20 NOTE — Telephone Encounter (Signed)
Lvm for patient to call clinic back to schedule lab work Artist) for Cardiac CT

## 2021-03-23 ENCOUNTER — Other Ambulatory Visit (HOSPITAL_BASED_OUTPATIENT_CLINIC_OR_DEPARTMENT_OTHER): Payer: Self-pay

## 2021-03-23 ENCOUNTER — Other Ambulatory Visit: Payer: Medicare Other | Admitting: *Deleted

## 2021-03-23 ENCOUNTER — Other Ambulatory Visit: Payer: Self-pay

## 2021-03-23 DIAGNOSIS — Z01818 Encounter for other preprocedural examination: Secondary | ICD-10-CM

## 2021-03-24 LAB — BASIC METABOLIC PANEL
BUN/Creatinine Ratio: 22 (ref 12–28)
BUN: 21 mg/dL (ref 8–27)
CO2: 23 mmol/L (ref 20–29)
Calcium: 9.3 mg/dL (ref 8.7–10.3)
Chloride: 103 mmol/L (ref 96–106)
Creatinine, Ser: 0.97 mg/dL (ref 0.57–1.00)
Glucose: 98 mg/dL (ref 70–99)
Potassium: 4.4 mmol/L (ref 3.5–5.2)
Sodium: 140 mmol/L (ref 134–144)
eGFR: 62 mL/min/{1.73_m2} (ref >59)

## 2021-03-27 ENCOUNTER — Telehealth (HOSPITAL_COMMUNITY): Payer: Self-pay | Admitting: Emergency Medicine

## 2021-03-27 NOTE — Telephone Encounter (Signed)
Reaching out to patient to offer assistance regarding upcoming cardiac imaging study; pt verbalizes understanding of appt date/time, parking situation and where to check in, pre-test NPO status and medications ordered, and verified current allergies; name and call back number provided for further questions should they arise Marchia Bond RN [facility] [phone] office [phone] cell  Denies iv issues 100mg  metoprolol tart  Arrival 7:20a-7:45a

## 2021-03-28 ENCOUNTER — Encounter (HOSPITAL_COMMUNITY): Payer: Self-pay

## 2021-03-28 ENCOUNTER — Other Ambulatory Visit: Payer: Self-pay

## 2021-03-28 ENCOUNTER — Ambulatory Visit (HOSPITAL_COMMUNITY)
Admission: RE | Admit: 2021-03-28 | Discharge: 2021-03-28 | Disposition: A | Discharge status: Home / Self Care | Payer: Medicare Other | Source: Ambulatory Visit | Attending: Physician Assistant | Admitting: Physician Assistant

## 2021-03-28 DIAGNOSIS — R079 Chest pain, unspecified: Secondary | ICD-10-CM

## 2021-03-28 NOTE — Progress Notes (Signed)
Patient is scheduled to see SK 04/26/21. Does her appt need to be sooner than that?

## 2021-03-28 NOTE — Progress Notes (Signed)
Pt's heart rate in the 60s but very irregular. Verified with CT technologist. Dr. Harriet Masson notified. Per MD, abort CT at this time. Explained plan and rationale for cancellation to pt. Pt in agreement and verbalized understanding. Clarise Cruz CT navigator notified. CT aborted. Pt discharged home.

## 2021-03-29 ENCOUNTER — Telehealth: Payer: Self-pay | Admitting: Internal Medicine

## 2021-03-29 NOTE — Telephone Encounter (Signed)
Pt's heart rate in the 60s but very irregular. Verified with CT technologist. Dr. Harriet Masson notified. Per MD, abort CT at this time. Explained plan and rationale for cancellation to pt. Pt in agreement and verbalized understanding. Clarise Cruz CT navigator notified. CT aborted. Pt discharged home.         Electronically signed by Leeanne Deed, RN at 03/28/2021  7:58 AM

## 2021-03-29 NOTE — Telephone Encounter (Signed)
Pt was scheduled for a CT Cardiac  on 03/28/21, the test could not be performed. Pt is f/u to make sure the cardiologist is aware

## 2021-03-29 NOTE — Telephone Encounter (Signed)
Pt is scheduled to see Dr Caryl Comes 04/27/2021.

## 2021-04-03 ENCOUNTER — Ambulatory Visit (HOSPITAL_BASED_OUTPATIENT_CLINIC_OR_DEPARTMENT_OTHER): Payer: Medicare Other | Admitting: Family Medicine

## 2021-04-03 ENCOUNTER — Encounter: Payer: Medicare Other | Admitting: Gastroenterology

## 2021-04-09 ENCOUNTER — Ambulatory Visit (HOSPITAL_BASED_OUTPATIENT_CLINIC_OR_DEPARTMENT_OTHER): Payer: Medicare Other | Admitting: Family Medicine

## 2021-04-22 DIAGNOSIS — Z20822 Contact with and (suspected) exposure to covid-19: Secondary | ICD-10-CM | POA: Diagnosis not present

## 2021-04-23 ENCOUNTER — Encounter (HOSPITAL_BASED_OUTPATIENT_CLINIC_OR_DEPARTMENT_OTHER): Payer: Self-pay | Admitting: Family Medicine

## 2021-04-25 ENCOUNTER — Other Ambulatory Visit (HOSPITAL_BASED_OUTPATIENT_CLINIC_OR_DEPARTMENT_OTHER): Payer: Self-pay | Admitting: Family Medicine

## 2021-04-25 ENCOUNTER — Other Ambulatory Visit (HOSPITAL_BASED_OUTPATIENT_CLINIC_OR_DEPARTMENT_OTHER): Payer: Self-pay

## 2021-04-25 ENCOUNTER — Telehealth: Payer: Self-pay | Admitting: Internal Medicine

## 2021-04-25 DIAGNOSIS — B342 Coronavirus infection, unspecified: Secondary | ICD-10-CM

## 2021-04-25 MED ORDER — MOLNUPIRAVIR EUA 200MG CAPSULE
4.0000 | ORAL_CAPSULE | Freq: Two times a day (BID) | ORAL | 0 refills | Status: AC
Start: 2021-04-25 — End: 2021-04-30
  Filled 2021-04-25: qty 40, 5d supply, fill #0

## 2021-04-25 NOTE — Telephone Encounter (Signed)
Pt has been diagnosed with covid and would like to know if she is ok with taking covid prescription as well as verapamil together... please advise

## 2021-04-25 NOTE — Progress Notes (Signed)
Spoke with patient regarding symptoms and positive Covid test. Consideration was for starting Covid-specific treatment. Given use of CR verapamil, cardiology Pharmacist recommended considering molnupiravir as alternative. Discussed with patient primary concerns related to Paxlovid vs molnupiravir -while alternative option has less medication interaction, there is also uncertain evidence regarding its true impact on reducing hospitalization/risk of progression to severe disease.  Patient voiced understanding and would like to proceed with alternative option in order to avoid risk of medication interaction.  Medication sent to pharmacy on file.  Patient is aware that should any further worsening in symptoms, particularly related to breathing status, she should present to emergency department for further evaluation.

## 2021-04-25 NOTE — Telephone Encounter (Signed)
There's an increased risk of bradycardia, hypotension, and cardiac rhythm changes if verapamil is used with Paxlovid. There are not clear recommendations on dose adjustment for verapamil either, and she is on a sustained release formulation so the dose can't easily be modified. Would see if she could be prescribed molnupiravir for COVID instead of Paxlovid to avoid drug interaction.

## 2021-04-25 NOTE — Telephone Encounter (Signed)
Spoke with pt and advised of Megan Supple's, RPH recommendation as below.  Pt states she is not having any breathing issues or fever at this time.  Pt states she will contact her PCP and notify of recommendation.

## 2021-04-26 ENCOUNTER — Other Ambulatory Visit (HOSPITAL_BASED_OUTPATIENT_CLINIC_OR_DEPARTMENT_OTHER): Payer: Self-pay

## 2021-04-27 ENCOUNTER — Ambulatory Visit: Payer: Medicare Other | Admitting: Internal Medicine

## 2021-05-01 ENCOUNTER — Telehealth: Payer: Self-pay | Admitting: *Deleted

## 2021-05-01 NOTE — Telephone Encounter (Signed)
Call placed concerning appointments dor cardiology and colonoscopy,message left.

## 2021-05-02 NOTE — Telephone Encounter (Signed)
Patient called and requested to cancel colon until she has OV with cardiologist on 2/27. Patient will call us back to reschedule colonoscopy once she has cardiac clearance. Colon cx.

## 2021-05-02 NOTE — Telephone Encounter (Signed)
Patient returned call

## 2021-05-08 ENCOUNTER — Other Ambulatory Visit (HOSPITAL_BASED_OUTPATIENT_CLINIC_OR_DEPARTMENT_OTHER): Payer: Self-pay

## 2021-05-08 MED ORDER — ROSUVASTATIN CALCIUM 5 MG PO TABS
5.0000 mg | ORAL_TABLET | Freq: Every day | ORAL | 1 refills | Status: DC
  Filled 2021-05-08: qty 90, 90d supply, fill #0

## 2021-05-08 NOTE — Addendum Note (Signed)
Addended by: DE [place], Kyung Rudd J on: 05/08/2021 11:02 AM   Modules accepted: Orders

## 2021-05-10 ENCOUNTER — Other Ambulatory Visit: Payer: Self-pay | Admitting: Family Medicine

## 2021-05-12 NOTE — Progress Notes (Signed)
Cardiology Office Note Date:  05/12/2021  Patient ID:  Sara [name], Sara [name] 1945-12-26, MRN [id number] PCP:  de [place], Raymond J, MD  Electrophysiologist: Dr. Caryl Comes   Chief Complaint:   planned f/u  History of Present Illness: Sara [name] is a 76 y.o. female with history of barrett's esophagus, GERD, hiatal hernia, HTN, HLD, palpitations, PACs  She comes in today to be seen for Dr. Caryl Comes, referred to him June 2022 by he PMD, noted a monitor she wore with: " burden of PACs and couplets to the tune of about 90% of her total beats but also frequent runs of SVT (417) the longest lasting 2 minutes and the fastest heart rates in the 250 range.  1 of these episodes was associated with the aforementioned symptoms of chest discomfort and shortness of breath and not associated with palpitations.  She also had multiple episodes of tachycardia during waking hours that were not associated with device activation.  This not withstanding heart rates greater than 200s"  Wrapped up : frequent episodes of irregular rapid atrial tachycardia not clearly atrial fibrillation as there are intermittent clearly discernible P waves Planned to stop amlodipine and start verapamil Get an echo and CTa vs stress test No clear AFib, not started on a/c.  She saw A. Sula Rumple August 2022 for follow up, Dr. Caryl Comes felt Sara [name] was without ischemia, LVEF by her echo preserved 55-60%, asymmetric LVH ?HCM . C.mRI noted mild asymmetrical LVH NOT c/w HCM  Planned to continue verapamil with some improvement (not resolved) palpitations, discussed perhaps dilt vs flecainide   I saw her 02/23/21 She is feeling better. Rare awareness of palpitations, these can sometimes be associated with a slight dizziness, though much improved then prior to being on verapamil. She has some chest heaviness this also pre-dates verapamil and is also better, but not resolved This is exertional, not with ADLs but with heavier exertion like  carrying things, moving furniture to clean, is is associated with SOB and resolves fairly quickly with rest. She likes to walk for exercise, prefers the park with benches that she can rest on when her back hurts or feels SOB (this not new) She will occ feel lightheaded when she first gets up in the AM No syncope or near syncope With some ongoing symptoms of CP and perhaps some peri-infarct ischemia, planned for coronary CT  CT was cancelled 2/2 HR irregularity  COVID + 04/23/21  TODAY She is feeling better from [place], though still gets more SOB with activities since the COVID infection but improving.  She did have an increase in her palpitations with the acutely ill phase, some associated with some heaviness.  She states the day of the CT she took the metoprolol, not the verapamil as instructed at home, and by the time she got to the hospital for the scan felt weak, a little diaphoretic, felt palpitations and some heaviness in her chest.  When they did her vitals her BP was OK, a little higher then usual actually but they said her HR was to irregular to do the scan.  By the time ready to go she was feeling better.  The palpitations also associate with some dizziness. No syncope.  Generally though seems the palpitations remain improved She wonders if some of her symptoms may be her hiatal hernia    Past Medical History:  Diagnosis Date   Anxiety 07/2020   Arthritis 11/2017   Barrett's esophagus    Cataract    had  surgery   GERD (gastroesophageal reflux disease)    hx of    HIATAL HERNIA    s/p surgical repair   Hyperlipidemia    HYPERTENSION    Kidney stone    1990s   Osteoporosis     Past Surgical History:  Procedure Laterality Date   ABDOMINAL HYSTERECTOMY  06/12/2012   CATARACT EXTRACTION  2009   both eyes   CHOLECYSTECTOMY  11/02/2012   Procedure: CHOLECYSTECTOMY;  Surgeon: Pedro Earls, MD;  Location: [facility];  Service: General;;   COLOSTOMY     CYSTOCELE REPAIR  N/A 06/12/2012   Procedure: ANTERIOR REPAIR (CYSTOCELE);  Surgeon: Elveria Royals, MD;  Location: [facility];  Service: Gynecology;  Laterality: N/A;   EYE SURGERY  Cataract removal 2009   HERNIA REPAIR  2011   HIATAL HERNIA REPAIR  2011   LITHOTRIPSY     TONSILLECTOMY  1968   UPPER GASTROINTESTINAL ENDOSCOPY     UPPER GI ENDOSCOPY  11/02/2012   Procedure: UPPER GI ENDOSCOPY;  Surgeon: Pedro Earls, MD;  Location: [facility];  Service: General;;   VAGINAL HYSTERECTOMY N/A 06/12/2012   Procedure: HYSTERECTOMY VAGINAL;  Surgeon: Elveria Royals, MD;  Location: [facility];  Service: Gynecology;  Laterality: N/A;    Current Outpatient Medications  Medication Sig Dispense Refill   Calcium Carbonate-Vitamin D (CALCIUM-VITAMIN D) 500-200 MG-UNIT per tablet Take 1 tablet by mouth 2 (two) times daily with a meal.     Cyanocobalamin (VITAMIN B-12 CR PO) Take 4,000 Units by mouth daily.      metoprolol tartrate (LOPRESSOR) 100 MG tablet Take 1 tablet by mouth 2 hours prior to procedure as directed 1 tablet 0   Multiple Vitamin (MULTIVITAMIN WITH MINERALS) TABS Take 1 tablet by mouth daily.     pantoprazole (PROTONIX) 40 MG tablet Take 1 tablet (40 mg total) by mouth 2 (two) times daily before a meal. Take 20-30 minutes before breakfast and dinner meals. 60 tablet 11   pantoprazole (PROTONIX) 40 MG tablet Take 1 tablet (40 mg total) by mouth 2 times daily 20-30 minutes before breakfast and dinner meals. 180 tablet 2   Probiotic Product (PROBIOTIC DAILY PO) Take by mouth daily.     rosuvastatin (CRESTOR) 5 MG tablet Take 1 tablet (5 mg total) by mouth daily. 90 tablet 1   verapamil (CALAN-SR) 180 MG CR tablet Take 1 tablet (180 mg total) by mouth daily. 90 tablet 3   No current facility-administered medications for this visit.    Allergies:   Augmentin [amoxicillin-pot clavulanate]   Social History:  The patient  reports that she has never smoked. She has never used smokeless tobacco. She reports that she  does not drink alcohol and does not use drugs.   Family History:  The patient's family history includes Asthma in her maternal grandmother; Breast cancer in her maternal grandmother and another family member; Breast cancer (age of onset: 70) in her mother; Cancer in her maternal grandmother, mother, paternal aunt, and paternal uncle; Colon cancer in her paternal aunt; Early death in her father and paternal uncle; Heart disease in her father and another family member; Hypertension in her mother and another family member; Kidney disease in her father.  ROS:  Please see the history of present illness.    All other systems are reviewed and otherwise negative.   PHYSICAL EXAM:  VS:  LMP  (LMP Unknown)  BMI: There is no height or weight on file to calculate BMI. Well nourished,  well developed, in no acute distress HEENT: normocephalic, atraumatic Neck: no JVD, carotid bruits or masses Cardiac:  RRR; no significant murmurs, no rubs, or gallops Lungs:  CTA b/l, no wheezing, rhonchi or rales Abd: soft, nontender MS: no deformity or atrophy Ext: no edema Skin: warm and dry, no rash Neuro:  No gross deficits appreciated Psych: euthymic mood, full affect    EKG:  not done today  10/31/2020: c.MRI IMPRESSION: 1. Asymmetric LV hypertrophy measuring 49mm in basal septum (24mm in posterior wall). Does not meet criteria for hypertrophic cardiomyopathy (less than 64mm wall thickness)   2.  No lata gadolinium enhancement to suggest myocardial scar   3.  Normal LV size and systolic function (EF 85%)   4.  Normal RV size and systolic function (EF 27%)   5.  Large hiatal hernia   10/10/2020: TTE IMPRESSIONS   1. Left ventricular ejection fraction, by estimation, is 55 to 60%. Left  ventricular ejection fraction by 3D volume is 57 %. The left ventricle has  normal function. The left ventricle has no regional wall motion  abnormalities. There is moderate  asymmetric left ventricular hypertrophy of  the basal-septal segment. Left  ventricular diastolic parameters are indeterminate. The average left  ventricular global longitudinal strain is -21.2 %.   2. Right ventricular systolic function is normal. The right ventricular  size is normal. There is normal pulmonary artery systolic pressure. The  estimated right ventricular systolic pressure is 78.2 mmHg.   3. The mitral valve is normal in structure. Trivial mitral valve  regurgitation. No evidence of mitral stenosis.   4. The aortic valve is tricuspid. Aortic valve regurgitation is not  visualized. Mild aortic valve sclerosis is present, with no evidence of  aortic valve stenosis.   5. Aortic dilatation noted. There is mild dilatation of the ascending  aorta, measuring 38 mm.   6. The inferior vena cava is normal in size with greater than 50%  respiratory variability, suggesting right atrial pressure of 3 mmHg.    10/10/2020: stress myoview The left ventricular ejection fraction is normal (55-65%). Nuclear stress EF: 60%. There was no ST segment deviation noted during stress. Defect 1: There is a medium defect of severe severity present in the basal inferoseptal, basal inferior, mid inferoseptal and mid inferior location. Findings consistent with prior myocardial infarction with peri-infarct ischemia. This is a low risk study.   Abnormal, low risk stress nuclear study with prior inferior infarct and minimal peri-infarct ischemia.  Gated ejection fraction 60% with hypokinesis of the basal inferior wall.  Recent Labs: 07/24/2020: ALT 11; Hemoglobin 14.6; Platelets 240.0; TSH 0.83 03/23/2021: BUN 21; Creatinine, Ser 0.97; Potassium 4.4; Sodium 140  05/31/2020: Cholesterol 126; HDL 53.90; LDL Cholesterol 53; Total CHOL/HDL Ratio 2; Triglycerides 98.0; VLDL 19.6   CrCl cannot be calculated (Patient's most recent lab result is older than the maximum 21 days allowed.).   Wt Readings from Last 3 Encounters:  03/13/21 165 lb 11.2 oz (75.2  kg)  02/27/21 168 lb (76.2 kg)  02/23/21 169 lb (76.7 kg)     Other studies reviewed: Additional studies/records reviewed today include: summarized above  ASSESSMENT AND PLAN:  Palpitations PACs, AT  Much better on CCB  HTN Better at home, no changes  CP Continues with some, again with increased exertion, none with ADLs, a couple with her COVID iillness that happened at rest but with palpitations I will reach out to the CT RN navigator, and see, maybe we can do her  verapamil pre-scn instead of the BB. I would like more definitive eval of her coronaries, though not sure we need to move on to cath  Disposition: will try to get the CT reordered, plan 45mo, otherwise, sooner if needed  Current medicines are reviewed at length with the patient today.  The patient did not have any concerns regarding medicines.  Venetia Night, PA-C 05/12/2021 10:48 AM     [facility] [place] [address] [phone] (office)  [phone] (fax)

## 2021-05-15 ENCOUNTER — Ambulatory Visit: Payer: Medicare Other | Admitting: Physician Assistant

## 2021-05-15 ENCOUNTER — Other Ambulatory Visit: Payer: Self-pay

## 2021-05-15 ENCOUNTER — Encounter: Payer: Self-pay | Admitting: Physician Assistant

## 2021-05-15 VITALS — BP 132/86 | HR 68 | Ht 68.5 in | Wt 166.0 lb

## 2021-05-15 DIAGNOSIS — R0789 Other chest pain: Secondary | ICD-10-CM | POA: Diagnosis not present

## 2021-05-15 DIAGNOSIS — I471 Supraventricular tachycardia: Secondary | ICD-10-CM

## 2021-05-15 DIAGNOSIS — I10 Essential (primary) hypertension: Secondary | ICD-10-CM

## 2021-05-15 NOTE — Patient Instructions (Addendum)
Medication Instructions:   Your physician recommends that you continue on your current medications as directed. Please refer to the Current Medication list given to you today.  *If you need a refill on your cardiac medications before your next appointment, please call your pharmacy*   Lab Work:  [place]   If you have labs (blood work) drawn today and your tests are completely normal, you will receive your results only by: [place] (if you have MyChart) OR A paper copy in the mail If you have any lab test that is abnormal or we need to change your treatment, we will call you to review the results.   Testing/Procedures: NONE ORDERED  TODAY     Follow-Up: At [facility], you and your health needs are our priority.  As part of our continuing mission to provide you with exceptional heart care, we have created designated Provider Care Teams.  These Care Teams include your primary Cardiologist (physician) and Advanced Practice Providers (APPs -  Physician Assistants and Nurse Practitioners) who all work together to provide you with the care you need, when you need it.  We recommend signing up for the patient portal called "MyChart".  Sign up information is provided on this After Visit Summary.  MyChart is used to connect with patients for Virtual Visits (Telemedicine).  Patients are able to view lab/test results, encounter notes, upcoming appointments, etc.  Non-urgent messages can be sent to your provider as well.   To learn more about what you can do with MyChart, go to [email or web address].    Your next appointment:   2 month(s) ( CONTACT [facility] FOR EP SCHEDULING ISSUES )   The format for your next appointment:   In Person  Provider:   You may see Virl Axe, MD or one of the following Advanced Practice Providers on your designated Care Team:   Tommye Standard, [place] Legrand Como "Jonni Sanger" Chalmers Cater, [place]    Other Instructions

## 2021-05-21 ENCOUNTER — Encounter: Payer: Medicare Other | Admitting: Gastroenterology

## 2021-05-25 ENCOUNTER — Telehealth (HOSPITAL_COMMUNITY): Payer: Self-pay | Admitting: *Deleted

## 2021-05-25 ENCOUNTER — Other Ambulatory Visit (HOSPITAL_COMMUNITY): Payer: Self-pay | Admitting: *Deleted

## 2021-05-25 DIAGNOSIS — Z01812 Encounter for preprocedural laboratory examination: Secondary | ICD-10-CM

## 2021-05-25 NOTE — Telephone Encounter (Signed)
Reaching out to patient to offer assistance regarding upcoming cardiac imaging study; pt verbalizes understanding of appt date/time, parking situation and where to check in, pre-test NPO status and verified current allergies; name and call back number provided for further questions should they arise ? ?Gordy Clement RN Navigator Cardiac Imaging ?[facility] ?[phone] office ?[phone] cell ? ?Pt to take her daily verapmil two hours prior to her cardiac CT scan.  She is aware to obtain blood work prior to her appointment and to arrive at 7:15am for her 7:45am scan. ?

## 2021-05-28 DIAGNOSIS — Z01812 Encounter for preprocedural laboratory examination: Secondary | ICD-10-CM | POA: Diagnosis not present

## 2021-05-28 LAB — BASIC METABOLIC PANEL
BUN/Creatinine Ratio: 15 (ref 12–28)
BUN: 15 mg/dL (ref 8–27)
CO2: 31 mmol/L — ABNORMAL HIGH (ref 20–29)
Calcium: 9.8 mg/dL (ref 8.7–10.3)
Chloride: 103 mmol/L (ref 96–106)
Creatinine, Ser: 1.02 mg/dL — ABNORMAL HIGH (ref 0.57–1.00)
Glucose: 97 mg/dL (ref 70–99)
Potassium: 3.7 mmol/L (ref 3.5–5.2)
Sodium: 140 mmol/L (ref 134–144)
eGFR: 58 mL/min/{1.73_m2} — ABNORMAL LOW (ref >59)

## 2021-05-29 ENCOUNTER — Ambulatory Visit (HOSPITAL_COMMUNITY)
Admission: RE | Admit: 2021-05-29 | Discharge: 2021-05-29 | Disposition: A | Discharge status: Home / Self Care | Payer: Medicare Other | Source: Ambulatory Visit | Attending: Physician Assistant | Admitting: Physician Assistant

## 2021-05-29 ENCOUNTER — Other Ambulatory Visit: Payer: Self-pay

## 2021-05-29 DIAGNOSIS — R079 Chest pain, unspecified: Secondary | ICD-10-CM | POA: Insufficient documentation

## 2021-05-29 DIAGNOSIS — I700 Atherosclerosis of aorta: Secondary | ICD-10-CM | POA: Diagnosis not present

## 2021-05-29 DIAGNOSIS — K449 Diaphragmatic hernia without obstruction or gangrene: Secondary | ICD-10-CM | POA: Diagnosis not present

## 2021-05-29 MED ORDER — NITROGLYCERIN 0.4 MG SL SUBL
SUBLINGUAL_TABLET | SUBLINGUAL | Status: AC
  Filled 2021-05-29: qty 2

## 2021-05-29 MED ORDER — NITROGLYCERIN 0.4 MG SL SUBL
0.8000 mg | SUBLINGUAL_TABLET | Freq: Once | SUBLINGUAL | Status: AC
  Administered 2021-05-29: 0.8 mg via SUBLINGUAL

## 2021-05-29 MED ORDER — DILTIAZEM HCL 25 MG/5ML IV SOLN: 5.0000 mg | INTRAVENOUS | Status: DC | PRN

## 2021-05-29 MED ORDER — DILTIAZEM HCL 25 MG/5ML IV SOLN
INTRAVENOUS | Status: AC
  Administered 2021-05-29: 5 mg via INTRAVENOUS
  Filled 2021-05-29: qty 5

## 2021-05-29 MED ORDER — IOHEXOL 350 MG/ML SOLN
95.0000 mL | Freq: Once | INTRAVENOUS | Status: AC | PRN
  Administered 2021-05-29: 95 mL via INTRAVENOUS

## 2021-05-31 ENCOUNTER — Telehealth: Payer: Self-pay | Admitting: Physician Assistant

## 2021-05-31 NOTE — Telephone Encounter (Signed)
Called and reviewed the CTa with the patient. ?Her symptoms are stable.  In further discussion, some less cardiac sounding and perhaps her hiatal hernia. ?We discussed the limitations of her CT 2/2 artifact, her stress test being low risk, Dr. Caryl Comes did not think there was any ischemia. ?AT this time, plan to monitor her symptoms, discussed if any escalation will plan for cath. ? ?Discussed we need to aggressively treat her cholesterol.  Will have her come in for Lipids panel and LFTs, increase her Crestor to '10mg'$  daily for now and titrate from there ? ?She reported some LE swelling as the day progresses, perhaps the verapamil.  Will not change given has done well in reducing her palpitations/symptoms. ?Advised support stockings and elevation ? ?She has an appt next month, will keep that and revisit how she is dong ?

## 2021-06-01 ENCOUNTER — Other Ambulatory Visit: Payer: Self-pay | Admitting: *Deleted

## 2021-06-01 DIAGNOSIS — I700 Atherosclerosis of aorta: Secondary | ICD-10-CM

## 2021-06-01 MED ORDER — ROSUVASTATIN CALCIUM 10 MG PO TABS: 10.0000 mg | ORAL_TABLET | Freq: Every day | ORAL | Status: DC

## 2021-06-01 NOTE — Telephone Encounter (Signed)
Spoke with patient and patient  aware has been scheduled for labs on 06-11-21 ?

## 2021-06-01 NOTE — Telephone Encounter (Signed)
? ?  Pt is calling back to f/u, wanted to schedule labs but order is not yet on file ?

## 2021-06-04 ENCOUNTER — Other Ambulatory Visit (HOSPITAL_BASED_OUTPATIENT_CLINIC_OR_DEPARTMENT_OTHER): Payer: Self-pay

## 2021-06-11 ENCOUNTER — Other Ambulatory Visit: Payer: Self-pay

## 2021-06-11 ENCOUNTER — Other Ambulatory Visit: Payer: Medicare Other | Admitting: *Deleted

## 2021-06-11 DIAGNOSIS — I700 Atherosclerosis of aorta: Secondary | ICD-10-CM | POA: Diagnosis not present

## 2021-06-11 LAB — HEPATIC FUNCTION PANEL
ALT: 14 IU/L (ref 0–32)
AST: 19 IU/L (ref 0–40)
Albumin: 4.3 g/dL (ref 3.7–4.7)
Alkaline Phosphatase: 88 IU/L (ref 44–121)
Bilirubin Total: 0.4 mg/dL (ref 0.0–1.2)
Bilirubin, Direct: 0.14 mg/dL (ref 0.00–0.40)
Total Protein: 6.7 g/dL (ref 6.0–8.5)

## 2021-06-11 LAB — LIPID PANEL
Chol/HDL Ratio: 2.5 ratio (ref 0.0–4.4)
Cholesterol, Total: 132 mg/dL (ref 100–199)
HDL: 53 mg/dL (ref >39)
LDL Chol Calc (NIH): 61 mg/dL (ref 0–99)
Triglycerides: 97 mg/dL (ref 0–149)
VLDL Cholesterol Cal: 18 mg/dL (ref 5–40)

## 2021-06-13 ENCOUNTER — Other Ambulatory Visit (HOSPITAL_BASED_OUTPATIENT_CLINIC_OR_DEPARTMENT_OTHER): Payer: Self-pay

## 2021-06-13 ENCOUNTER — Telehealth: Payer: Self-pay | Admitting: *Deleted

## 2021-06-13 ENCOUNTER — Other Ambulatory Visit: Payer: Self-pay | Admitting: *Deleted

## 2021-06-13 DIAGNOSIS — Z79899 Other long term (current) drug therapy: Secondary | ICD-10-CM

## 2021-06-13 DIAGNOSIS — I700 Atherosclerosis of aorta: Secondary | ICD-10-CM

## 2021-06-13 MED ORDER — ROSUVASTATIN CALCIUM 10 MG PO TABS
10.0000 mg | ORAL_TABLET | Freq: Every day | ORAL | 2 refills | Status: DC
  Filled 2021-06-13: qty 90, 90d supply, fill #0

## 2021-06-13 NOTE — Telephone Encounter (Signed)
Lvm with results and clinic number for callback to schedule repeat lab work in 2 months ?

## 2021-06-13 NOTE — Telephone Encounter (Signed)
-----   Message from [place], [place] sent at 06/11/2021  7:54 PM EDT ----- ?Repeat in 2 mo ?

## 2021-06-22 ENCOUNTER — Other Ambulatory Visit (HOSPITAL_BASED_OUTPATIENT_CLINIC_OR_DEPARTMENT_OTHER): Payer: Self-pay

## 2021-06-22 ENCOUNTER — Ambulatory Visit (INDEPENDENT_AMBULATORY_CARE_PROVIDER_SITE_OTHER): Payer: Medicare Other | Admitting: Family Medicine

## 2021-06-22 VITALS — BP 112/72 | HR 87 | Temp 98.7°F | Ht 68.0 in | Wt 165.0 lb

## 2021-06-22 DIAGNOSIS — Z0000 Encounter for general adult medical examination without abnormal findings: Secondary | ICD-10-CM

## 2021-06-22 DIAGNOSIS — K219 Gastro-esophageal reflux disease without esophagitis: Secondary | ICD-10-CM | POA: Diagnosis not present

## 2021-06-22 DIAGNOSIS — K449 Diaphragmatic hernia without obstruction or gangrene: Secondary | ICD-10-CM

## 2021-06-22 DIAGNOSIS — E785 Hyperlipidemia, unspecified: Secondary | ICD-10-CM

## 2021-06-22 MED ORDER — SHINGRIX 50 MCG/0.5ML IM SUSR
INTRAMUSCULAR | 1 refills | Status: DC
  Filled 2021-06-22: qty 0.5, 1d supply, fill #0
  Filled 2021-06-25 – 2021-09-06 (×2): qty 0.5, 1d supply, fill #1

## 2021-06-22 NOTE — Assessment & Plan Note (Signed)
Continues to have some issues related to hernia, would like to discuss symptoms and treatment options with surgeon who performed the operation in the past ?Referral placed to Dr. Hassell Done today ?

## 2021-06-22 NOTE — Patient Instructions (Signed)
?  Medication Instructions:  ?Your physician recommends that you continue on your current medications as directed. Please refer to the Current Medication list given to you today. ?--If you need a refill on any your medications before your next appointment, please call your pharmacy first. If no refills are authorized on file call the office.-- ? ? ?Referrals/Procedures/Imaging: ?Dr Hassell Done ? ?Follow-Up: ?Your next appointment:   ?Your physician recommends that you schedule a follow-up appointment in: 2 month CPE labs on week prior to appointment with Dr. Tennis Must [place] ? ?You will receive a text message or e-mail with a link to a survey about your care and experience with [place] today! We would greatly appreciate your feedback!  ? ?Thanks for letting us be apart of your health journey!!  ?Primary Care and Sports Medicine  ? ?Dr. Kyung Rudd de [place]  ? ?We encourage you to activate your patient portal called "MyChart".  Sign up information is provided on this After Visit Summary.  MyChart is used to connect with patients for Virtual Visits (Telemedicine).  Patients are able to view lab/test results, encounter notes, upcoming appointments, etc.  Non-urgent messages can be sent to your provider as well. To learn more about what you can do with MyChart, please visit --  [email or web address].    ?

## 2021-06-22 NOTE — Progress Notes (Signed)
? ? ?  Procedures performed today:   ? ?None. ? ?Independent interpretation of notes and tests performed by another provider:  ? ?None. ? ?Brief History, Exam, Impression, and Recommendations:   ? ?BP 112/72   Pulse 87   Temp 98.7 ?F (37.1 ?C)   Ht '5\' 8"'$  (1.727 m)   Wt 165 lb (74.8 kg)   LMP  (LMP Unknown)   SpO2 97%   BMI 25.09 kg/m?  ? ?Diaphragmatic hernia ?Continues to have some issues related to hernia, would like to discuss symptoms and treatment options with surgeon who performed the operation in the past ?Referral placed to Dr. Hassell Done today ? ?GERD ?Has been utilizing pantoprazole twice daily to help control GERD symptoms.  No significant pain, not nearly as bad as it was prior to surgery for hiatal hernia in the past.  She would like to discuss surgical options with Dr. Hassell Done, referral placed today ? ?Dyslipidemia ?Reports recent change in medication by cardiology, now taking rosuvastatin 10 mg daily.  Indicates that she has been tolerating medication well, no myalgias.  Does have concerns related to higher dose of statin and potential impact to kidney function.  Did discuss that generally, have low concern regarding dose change of statin and impact on kidney function.  Feel that continuing with slightly increased dose of statin therapy is reasonable ? ?Plan for follow-up in 2 months for CPE, nurse visit for labs 1 week prior ? ? ?___________________________________________ ?Domenico Achord de [place], MD, ABFM, CAQSM ?Primary Care and Sports Medicine ?[facility] ?

## 2021-06-22 NOTE — Assessment & Plan Note (Signed)
Reports recent change in medication by cardiology, now taking rosuvastatin 10 mg daily.  Indicates that she has been tolerating medication well, no myalgias.  Does have concerns related to higher dose of statin and potential impact to kidney function.  Did discuss that generally, have low concern regarding dose change of statin and impact on kidney function.  Feel that continuing with slightly increased dose of statin therapy is reasonable ?

## 2021-06-22 NOTE — Assessment & Plan Note (Signed)
Has been utilizing pantoprazole twice daily to help control GERD symptoms.  No significant pain, not nearly as bad as it was prior to surgery for hiatal hernia in the past.  She would like to discuss surgical options with Dr. Hassell Done, referral placed today ?

## 2021-06-25 ENCOUNTER — Other Ambulatory Visit (HOSPITAL_BASED_OUTPATIENT_CLINIC_OR_DEPARTMENT_OTHER): Payer: Self-pay

## 2021-07-08 NOTE — Progress Notes (Signed)
? ?Cardiology Office Note ?Date:  07/08/2021  ?Patient ID:  Sara, [name] 1945/08/26, MRN [id number] ?PCP:  de [name], Sara J, MD  ?Electrophysiologist: Dr. Caryl [name] ?  ?Chief Complaint:    planned f/u ? ?History of Present Illness: ?Sara [name] is a 76 y.o. female with history of barrett's esophagus, GERD, hiatal hernia, HTN, HLD, palpitations, PACs ? ?She [name] in today to be seen for Dr. Caryl [name], referred to him June 2022 by he PMD, noted a monitor she wore with: ?" burden of PACs and couplets to the tune of about 90% of her total beats but also frequent runs of SVT (417) the longest lasting 2 minutes and the fastest heart rates in the 250 range.  1 of these episodes was associated with the aforementioned symptoms of chest discomfort and shortness of breath and not associated with palpitations.  She also had multiple episodes of tachycardia during waking hours that were not associated with device activation.  This not withstanding heart rates greater than 200s" ? ?Wrapped up : frequent episodes of irregular rapid atrial tachycardia not clearly atrial fibrillation as there are intermittent clearly discernible P waves ?Planned to stop amlodipine and start verapamil ?Get an echo and CTa vs stress test ?No clear AFib, not started on a/c. ? ?She saw Sara [name] August 2022 for follow up, Dr. Caryl [name] felt Sara [name] was without ischemia, LVEF by her echo preserved 55-60%, asymmetric LVH ?HCM . C.mRI noted mild asymmetrical LVH NOT c/w HCM ? ?Planned to continue verapamil with some improvement (not resolved) palpitations, discussed perhaps dilt vs flecainide ? ? ?I saw her 02/23/21 ?She is feeling better. ?Rare awareness of palpitations, these can sometimes be associated with a slight dizziness, though much improved then prior to being on verapamil. ?She has some chest heaviness this also pre-dates verapamil and is also better, but not resolved ?This is exertional, not with ADLs but with heavier exertion  like carrying things, moving furniture to clean, is is associated with SOB and resolves fairly quickly with rest. ?She likes to walk for exercise, prefers the park with benches that she can rest on when her back hurts or feels SOB (this not new) ?She will occ feel lightheaded when she first gets up in the AM ?No syncope or near syncope ?With some ongoing symptoms of CP and perhaps some peri-infarct ischemia, planned for coronary CT ? ?CT was cancelled 2/2 HR irregularity ? ?COVID + 04/23/21 ? ?I saw her 05/15/21 ?She is feeling better from [place], though still gets more SOB with activities since the COVID infection but improving.  She did have an increase in her palpitations with the acutely ill phase, some associated with some heaviness. ?She states the day of the CT she took the metoprolol, not the verapamil as instructed at home, and by the time she got to the hospital for the scan felt weak, a little diaphoretic, felt palpitations and some heaviness in her chest.  When they did her vitals her BP was OK, a little higher then usual actually but they said her HR was to irregular to do the scan.  By the time ready to go she was feeling better. ?The palpitations also associate with some dizziness. ?No syncope. ?Generally though seems the palpitations remain improved ?She wonders if some of her symptoms may be her hiatal hernia ? ? ?Coronary CT was completed with some limitations, not likely to have obstructive disease, not sent for FFR ?Discussed findings with the patient,  In further discussion,  some less cardiac sounding and perhaps her hiatal hernia. ?We discussed the limitations of her CT 2/2 artifact, her stress test being low risk, Dr. Caryl [name] did not think there was any ischemia. ?AT this time, plan to monitor her symptoms, discussed if any escalation will plan for cath. ?Discussed need for aggressive risk modification and strict cholesterol control, her crestor increased with plans for labs ? ?TODAY ?She is doing  well ?Noticed with the increase in Crestor dose a bit of brain fog. ?Her PMD put a referral in for surgery ebalu for her hernia ?She is not bothered at all by palpitations ?NO CP, "really feel well!" ?NO near syncope or syncope ? ? ?Past Medical History:  ?Diagnosis Date  ? Anxiety 07/2020  ? Arthritis 11/2017  ? Barrett's esophagus   ? Cataract   ? had surgery  ? GERD (gastroesophageal reflux disease)   ? hx of   ? HIATAL HERNIA   ? s/p surgical repair  ? Hyperlipidemia   ? HYPERTENSION   ? Kidney stone   ? 1990s  ? Osteoporosis   ? ? ?Past Surgical History:  ?Procedure Laterality Date  ? ABDOMINAL HYSTERECTOMY  06/12/2012  ? CATARACT EXTRACTION  2009  ? both eyes  ? CHOLECYSTECTOMY  11/02/2012  ? Procedure: CHOLECYSTECTOMY;  Surgeon: Sara Earls, MD;  Location: [facility];  Service: General;;  ? COLOSTOMY    ? CYSTOCELE REPAIR N/A 06/12/2012  ? Procedure: ANTERIOR REPAIR (CYSTOCELE);  Surgeon: Sara Royals, MD;  Location: [facility];  Service: Gynecology;  Laterality: N/A;  ? EYE SURGERY  Cataract removal 2009  ? HERNIA REPAIR  2011  ? HIATAL HERNIA REPAIR  2011  ? LITHOTRIPSY    ? TONSILLECTOMY  1968  ? UPPER GASTROINTESTINAL ENDOSCOPY    ? UPPER GI ENDOSCOPY  11/02/2012  ? Procedure: UPPER GI ENDOSCOPY;  Surgeon: Sara Earls, MD;  Location: [facility];  Service: General;;  ? VAGINAL HYSTERECTOMY N/A 06/12/2012  ? Procedure: HYSTERECTOMY VAGINAL;  Surgeon: Sara Royals, MD;  Location: [facility];  Service: Gynecology;  Laterality: N/A;  ? ? ?Current Outpatient Medications  ?Medication Sig Dispense Refill  ? Ascorbic Acid (VITAMIN C PO) Take 1 tablet by mouth once.    ? Calcium Carbonate-Vitamin D (CALCIUM-VITAMIN D) 500-200 MG-UNIT per tablet Take 1 tablet by mouth 2 (two) times daily with a meal. (Patient not taking: Reported on 06/22/2021)    ? Cyanocobalamin (VITAMIN B-12 CR PO) Take 4,000 Units by mouth daily.     ? Multiple Vitamin (MULTIVITAMIN WITH MINERALS) TABS Take 1 tablet by mouth daily.    ?  pantoprazole (PROTONIX) 40 MG tablet Take 1 tablet (40 mg total) by mouth 2 (two) times daily before a meal. Take 20-30 minutes before breakfast and dinner meals. 60 tablet 11  ? Probiotic Product (PROBIOTIC DAILY PO) Take 1 capsule by mouth daily.    ? rosuvastatin (CRESTOR) 10 MG tablet Take 1 tablet (10 mg total) by mouth daily. 90 tablet 2  ? verapamil (CALAN-SR) 180 MG CR tablet Take 1 tablet (180 mg total) by mouth daily. 90 tablet 3  ? Zoster Vaccine Adjuvanted [facility]) injection Inject into the muscle. 0.5 mL 1  ? ?No current facility-administered medications for this visit.  ? ? ?Allergies:   Augmentin [amoxicillin-pot clavulanate]  ? ?Social History:  The patient  reports that she has never smoked. She has been exposed to tobacco smoke. She has never used smokeless tobacco. She reports that she does not  drink alcohol and does not use drugs.  ? ?Family History:  The patient's family history includes Asthma in her maternal grandmother; Breast cancer in her maternal grandmother and another family member; Breast cancer (age of onset: 77) in her mother; Cancer in her maternal grandmother, mother, paternal aunt, and paternal uncle; Colon cancer in her paternal aunt; Early death in her father and paternal uncle; Heart disease in her father and another family member; Hypertension in her mother and another family member; Kidney disease in her father. ? ?ROS:  Please see the history of present illness.    ?All other systems are reviewed and otherwise negative.  ? ?PHYSICAL EXAM:  ?VS:  LMP  (LMP Unknown)  BMI: There is no height or weight on file to calculate BMI. ?Well nourished, well developed, in no acute distress ?HEENT: normocephalic, atraumatic ?Neck: no JVD, carotid bruits or masses ?Cardiac:  RRR; no significant murmurs, no rubs, or gallops ?Lungs:  CTA b/l, no wheezing, rhonchi or rales ?Abd: soft, nontender ?MS: no deformity or atrophy ?Ext: no edema ?Skin: warm and dry, no rash ?Neuro:  No gross  deficits appreciated ?Psych: euthymic mood, full affect ? ? ? ?EKG:  not done today ? ?05/29/2021: Coronary CT ?IMPRESSION: ?1. Coronary artery calcium score 929 Agatston units. This places the ?patient in the 95th percen

## 2021-07-10 ENCOUNTER — Ambulatory Visit: Payer: Medicare Other | Admitting: Physician Assistant

## 2021-07-10 ENCOUNTER — Other Ambulatory Visit (HOSPITAL_BASED_OUTPATIENT_CLINIC_OR_DEPARTMENT_OTHER): Payer: Self-pay | Admitting: Physician Assistant

## 2021-07-10 ENCOUNTER — Encounter: Payer: Self-pay | Admitting: Physician Assistant

## 2021-07-10 ENCOUNTER — Other Ambulatory Visit (HOSPITAL_BASED_OUTPATIENT_CLINIC_OR_DEPARTMENT_OTHER): Payer: Self-pay

## 2021-07-10 VITALS — BP 148/84 | HR 80 | Ht 67.0 in | Wt 163.0 lb

## 2021-07-10 DIAGNOSIS — I10 Essential (primary) hypertension: Secondary | ICD-10-CM | POA: Diagnosis not present

## 2021-07-10 DIAGNOSIS — I2510 Atherosclerotic heart disease of native coronary artery without angina pectoris: Secondary | ICD-10-CM | POA: Diagnosis not present

## 2021-07-10 DIAGNOSIS — I471 Supraventricular tachycardia: Secondary | ICD-10-CM | POA: Diagnosis not present

## 2021-07-10 MED ORDER — ROSUVASTATIN CALCIUM 5 MG PO TABS: 5.0000 mg | ORAL_TABLET | Freq: Every day | ORAL | 2 refills | Status: DC

## 2021-07-10 NOTE — Patient Instructions (Signed)
Medication Instructions:  ? ? ?START TAKING CRESTOR 5 MG ONCE A DAY  ? ? ?*If you need a refill on your cardiac medications before your next appointment, please call your pharmacy* ? ? ?Lab Work: RETURN FOR LABS IN July : LIPID AND LIVER  ? ?If you have labs (blood work) drawn today and your tests are completely normal, you will receive your results only by: ?MyChart Message (if you have MyChart) OR ?A paper copy in the mail ?If you have any lab test that is abnormal or we need to change your treatment, we will call you to review the results. ? ? ?Testing/Procedures: NONE ORDERED  TODAY ? ? ? ? ?Follow-Up: ?At [facility], you and your health needs are our priority.  As part of our continuing mission to provide you with exceptional heart care, we have created designated Provider Care Teams.  These Care Teams include your primary Cardiologist (physician) and Advanced Practice Providers (APPs -  Physician Assistants and Nurse Practitioners) who all work together to provide you with the care you need, when you need it. ? ?We recommend signing up for the patient portal called "MyChart".  Sign up information is provided on this After Visit Summary.  MyChart is used to connect with patients for Virtual Visits (Telemedicine).  Patients are able to view lab/test results, encounter notes, upcoming appointments, etc.  Non-urgent messages can be sent to your provider as well.   ?To learn more about what you can do with MyChart, go to [email or web address].   ? ?Your next appointment:   ?6 month(s) ? ?The format for your next appointment:   ?In Person ? ?Provider:   ?You may see Virl Axe, MD or one of the following Advanced Practice Providers on your designated Care Team:   ?Tommye Standard, PA-C ?Legrand Como "Jonni Sanger" Tolono, PA-C  ? ? ?Other Instructions ? ? ?Important Information About Sugar ? ? ? ? ?  ?

## 2021-07-11 ENCOUNTER — Encounter (HOSPITAL_BASED_OUTPATIENT_CLINIC_OR_DEPARTMENT_OTHER): Payer: Self-pay | Admitting: Pharmacist

## 2021-07-11 ENCOUNTER — Other Ambulatory Visit (HOSPITAL_BASED_OUTPATIENT_CLINIC_OR_DEPARTMENT_OTHER): Payer: Self-pay

## 2021-07-11 MED ORDER — ROSUVASTATIN CALCIUM 5 MG PO TABS
5.0000 mg | ORAL_TABLET | Freq: Every day | ORAL | 1 refills | Status: DC
  Filled 2021-07-11 – 2021-07-16 (×2): qty 90, 90d supply, fill #0
  Filled 2021-11-12: qty 90, 90d supply, fill #1

## 2021-07-16 ENCOUNTER — Other Ambulatory Visit (HOSPITAL_BASED_OUTPATIENT_CLINIC_OR_DEPARTMENT_OTHER): Payer: Self-pay

## 2021-07-24 ENCOUNTER — Other Ambulatory Visit (HOSPITAL_BASED_OUTPATIENT_CLINIC_OR_DEPARTMENT_OTHER): Payer: Self-pay

## 2021-08-01 DIAGNOSIS — K219 Gastro-esophageal reflux disease without esophagitis: Secondary | ICD-10-CM | POA: Diagnosis not present

## 2021-08-10 ENCOUNTER — Other Ambulatory Visit: Payer: Self-pay | Admitting: Surgery

## 2021-08-10 DIAGNOSIS — K449 Diaphragmatic hernia without obstruction or gangrene: Secondary | ICD-10-CM

## 2021-08-10 DIAGNOSIS — K219 Gastro-esophageal reflux disease without esophagitis: Secondary | ICD-10-CM

## 2021-08-13 ENCOUNTER — Other Ambulatory Visit: Payer: Medicare Other

## 2021-08-13 ENCOUNTER — Ambulatory Visit
Admission: RE | Admit: 2021-08-13 | Discharge: 2021-08-13 | Disposition: A | Discharge status: Home / Self Care | Payer: Medicare Other | Source: Ambulatory Visit | Attending: Surgery | Admitting: Surgery

## 2021-08-13 DIAGNOSIS — K219 Gastro-esophageal reflux disease without esophagitis: Secondary | ICD-10-CM | POA: Diagnosis not present

## 2021-08-13 DIAGNOSIS — K224 Dyskinesia of esophagus: Secondary | ICD-10-CM | POA: Diagnosis not present

## 2021-08-13 DIAGNOSIS — K449 Diaphragmatic hernia without obstruction or gangrene: Secondary | ICD-10-CM

## 2021-08-13 DIAGNOSIS — K5710 Diverticulosis of small intestine without perforation or abscess without bleeding: Secondary | ICD-10-CM | POA: Diagnosis not present

## 2021-08-14 ENCOUNTER — Ambulatory Visit (HOSPITAL_BASED_OUTPATIENT_CLINIC_OR_DEPARTMENT_OTHER): Payer: Medicare Other

## 2021-08-14 ENCOUNTER — Other Ambulatory Visit (HOSPITAL_BASED_OUTPATIENT_CLINIC_OR_DEPARTMENT_OTHER): Payer: Self-pay

## 2021-08-14 DIAGNOSIS — Z0000 Encounter for general adult medical examination without abnormal findings: Secondary | ICD-10-CM

## 2021-08-15 LAB — CBC WITH DIFFERENTIAL/PLATELET
Basophils Absolute: 0 10*3/uL (ref 0.0–0.2)
Basos: 1 %
EOS (ABSOLUTE): 0.3 10*3/uL (ref 0.0–0.4)
Eos: 5 %
Hematocrit: 41 % (ref 34.0–46.6)
Hemoglobin: 13.9 g/dL (ref 11.1–15.9)
Immature Grans (Abs): 0 10*3/uL (ref 0.0–0.1)
Immature Granulocytes: 0 %
Lymphocytes Absolute: 1.7 10*3/uL (ref 0.7–3.1)
Lymphs: 29 %
MCH: 30.9 pg (ref 26.6–33.0)
MCHC: 33.9 g/dL (ref 31.5–35.7)
MCV: 91 fL (ref 79–97)
Monocytes Absolute: 0.5 10*3/uL (ref 0.1–0.9)
Monocytes: 9 %
Neutrophils Absolute: 3.3 10*3/uL (ref 1.4–7.0)
Neutrophils: 56 %
Platelets: 235 10*3/uL (ref 150–450)
RBC: 4.5 x10E6/uL (ref 3.77–5.28)
RDW: 12.6 % (ref 11.7–15.4)
WBC: 5.9 10*3/uL (ref 3.4–10.8)

## 2021-08-15 LAB — BASIC METABOLIC PANEL
BUN/Creatinine Ratio: 13 (ref 12–28)
BUN: 14 mg/dL (ref 8–27)
CO2: 23 mmol/L (ref 20–29)
Calcium: 9.4 mg/dL (ref 8.7–10.3)
Chloride: 103 mmol/L (ref 96–106)
Creatinine, Ser: 1.06 mg/dL — ABNORMAL HIGH (ref 0.57–1.00)
Glucose: 91 mg/dL (ref 70–99)
Potassium: 4.3 mmol/L (ref 3.5–5.2)
Sodium: 139 mmol/L (ref 134–144)
eGFR: 55 mL/min/{1.73_m2} — ABNORMAL LOW (ref >59)

## 2021-08-15 LAB — HEMOGLOBIN A1C
Est. average glucose Bld gHb Est-mCnc: 100 mg/dL
Hgb A1c MFr Bld: 5.1 % (ref 4.8–5.6)

## 2021-08-15 LAB — TSH RFX ON ABNORMAL TO FREE T4: TSH: 0.913 u[IU]/mL (ref 0.450–4.500)

## 2021-08-22 ENCOUNTER — Ambulatory Visit (INDEPENDENT_AMBULATORY_CARE_PROVIDER_SITE_OTHER): Payer: Medicare Other | Admitting: Family Medicine

## 2021-08-22 ENCOUNTER — Encounter (HOSPITAL_BASED_OUTPATIENT_CLINIC_OR_DEPARTMENT_OTHER): Payer: Self-pay | Admitting: Family Medicine

## 2021-08-22 VITALS — BP 145/75 | HR 63 | Temp 97.6°F | Ht 66.0 in | Wt 162.8 lb

## 2021-08-22 DIAGNOSIS — I700 Atherosclerosis of aorta: Secondary | ICD-10-CM | POA: Diagnosis not present

## 2021-08-22 DIAGNOSIS — Z0000 Encounter for general adult medical examination without abnormal findings: Secondary | ICD-10-CM | POA: Diagnosis not present

## 2021-08-22 DIAGNOSIS — Z23 Encounter for immunization: Secondary | ICD-10-CM

## 2021-08-22 HISTORY — DX: Encounter for general adult medical examination without abnormal findings: Z00.00

## 2021-08-22 NOTE — Assessment & Plan Note (Signed)
Routine HCM labs reviewed. HCM reviewed/discussed. Anticipatory guidance regarding healthy weight, lifestyle and choices given. Recommend healthy diet.  Recommend approximately 150 minutes/week of moderate intensity exercise Recommend regular dental and vision exams Always use seatbelt/lap and shoulder restraints Recommend using smoke alarms and checking batteries at least twice a year Recommend using sunscreen when outside Discussed recommendations for shingles vaccine.  Patient is due for second Shingles vaccine now, received first dose 2 months ago Discussed tetanus immunization recommendations, patient agreed to proceed with this today DEXA and Mammo UTD

## 2021-08-22 NOTE — Progress Notes (Signed)
Subjective:    CC: Annual Physical Exam  HPI:  Sara [name] is a 76 y.o. presenting for annual physical  I reviewed the past medical history, family history, social history, surgical history, and allergies today and no changes were needed.  Please see the problem list section below in epic for further details.  Past Medical History: Past Medical History:  Diagnosis Date   Anxiety 07/2020   Arthritis 11/2017   Barrett's esophagus    Cataract    had surgery   GERD (gastroesophageal reflux disease)    hx of    HIATAL HERNIA    s/p surgical repair   Hyperlipidemia    HYPERTENSION    Kidney stone    1990s   Osteoporosis    Past Surgical History: Past Surgical History:  Procedure Laterality Date   ABDOMINAL HYSTERECTOMY  06/12/2012   CATARACT EXTRACTION  2009   both eyes   CHOLECYSTECTOMY  11/02/2012   Procedure: CHOLECYSTECTOMY;  Surgeon: Pedro Earls, MD;  Location: [facility];  Service: General;;   COLOSTOMY     CYSTOCELE REPAIR N/A 06/12/2012   Procedure: ANTERIOR REPAIR (CYSTOCELE);  Surgeon: Elveria Royals, MD;  Location: [facility];  Service: Gynecology;  Laterality: N/A;   EYE SURGERY  Cataract removal 2009   HERNIA REPAIR  2011   HIATAL HERNIA REPAIR  2011   LITHOTRIPSY     TONSILLECTOMY  1968   UPPER GASTROINTESTINAL ENDOSCOPY     UPPER GI ENDOSCOPY  11/02/2012   Procedure: UPPER GI ENDOSCOPY;  Surgeon: Pedro Earls, MD;  Location: [facility];  Service: General;;   VAGINAL HYSTERECTOMY N/A 06/12/2012   Procedure: HYSTERECTOMY VAGINAL;  Surgeon: Elveria Royals, MD;  Location: [facility];  Service: Gynecology;  Laterality: N/A;   Social History: Social History   Socioeconomic History   Marital status: Widowed    Spouse name: Not on file   Number of children: 5   Years of education: 14   Highest education level: Not on file  Occupational History   Occupation: Retired  Tobacco Use   Smoking status: Never    Passive exposure: Past   Smokeless tobacco:  Never  Vaping Use   Vaping Use: Never used  Substance and Sexual Activity   Alcohol use: Never   Drug use: Never   Sexual activity: Not Currently    Birth control/protection: Post-menopausal    Comment: Hysterectomy  Other Topics Concern   Not on file  Social History Narrative   widowed since late 2011 (spouse had mod-adv dementia) 5 grown children, 3 nearby-,many g-kids   Social Determinants of Radio broadcast assistant Strain: Not on file  Food Insecurity: Not on file  Transportation Needs: Not on file  Physical Activity: Not on file  Stress: Not on file  Social Connections: Not on file   Family History: Family History  Problem Relation Age of Onset   Breast cancer Mother 63       with mets    Cancer Mother    Hypertension Mother    Early death Father    Heart disease Father    Kidney disease Father    Hypertension Other        Parent   Breast cancer Other    Heart disease Other        parent, grandparent   Colon cancer Paternal Aunt    Breast cancer Maternal Grandmother    Asthma Maternal Grandmother    Cancer Maternal Grandmother  Cancer Paternal Uncle    Early death Paternal Uncle    Cancer Paternal Aunt    Esophageal cancer Neg Hx    Rectal cancer Neg Hx    Stomach cancer Neg Hx    Colon polyps Neg Hx    Allergies: Allergies  Allergen Reactions   Augmentin [Amoxicillin-Pot Clavulanate] Diarrhea   Medications: See med rec.  Review of Systems: No headache, visual changes, nausea, vomiting, diarrhea, constipation, dizziness, abdominal pain, skin rash, fevers, chills, night sweats, swollen lymph nodes, weight loss, chest pain, body aches, joint swelling, muscle aches, shortness of breath, mood changes, visual or auditory hallucinations.  Objective:    BP (!) 145/75   Pulse 63   Temp 97.6 F (36.4 C)   Ht '5\' 6"'$  (1.676 m)   Wt 162 lb 12.8 oz (73.8 kg)   LMP  (LMP Unknown)   SpO2 100%   BMI 26.28 kg/m   General: Well Developed, well  nourished, and in no acute distress.  Neuro: Alert and oriented x3, extra-ocular muscles intact, sensation grossly intact. Cranial nerves II through XII are intact, motor, sensory, and coordinative functions are all intact. HEENT: Normocephalic, atraumatic, pupils equal round reactive to light, neck supple, no masses, no lymphadenopathy, thyroid nonpalpable. Oropharynx, nasopharynx, external ear canals are unremarkable. Skin: Warm and dry, no rashes noted.  Cardiac: Regular rate and rhythm, no murmurs rubs or gallops.  Respiratory: Clear to auscultation bilaterally. Not using accessory muscles, speaking in full sentences.  Abdominal: Soft, nontender, nondistended, positive bowel sounds, no masses, no organomegaly.  Musculoskeletal: Shoulder, elbow, wrist, hip, knee, ankle stable, and with full range of motion.  Impression and Recommendations:    Wellness examination Routine HCM labs reviewed. HCM reviewed/discussed. Anticipatory guidance regarding healthy weight, lifestyle and choices given. Recommend healthy diet.  Recommend approximately 150 minutes/week of moderate intensity exercise Recommend regular dental and vision exams Always use seatbelt/lap and shoulder restraints Recommend using smoke alarms and checking batteries at least twice a year Recommend using sunscreen when outside Discussed recommendations for shingles vaccine.  Patient is due for second Shingles vaccine now, received first dose 2 months ago Discussed tetanus immunization recommendations, patient agreed to proceed with this today DEXA and Mammo UTD  Patient has been on new dose of rosuvastatin at 5 mg, decreased from 10 mg due to some side effects.  She has been on this new dose for about 6 weeks she indicates.  She would like to check lipid panel to assess status, feel this is reasonable, will check today  Return in about 4 months (around 12/22/2021) for  follow-up.   ___________________________________________ Deborra Phegley de [place], MD, ABFM, [facility] and [place]

## 2021-08-23 LAB — LIPID PANEL
Chol/HDL Ratio: 2.3 ratio (ref 0.0–4.4)
Cholesterol, Total: 131 mg/dL (ref 100–199)
HDL: 58 mg/dL (ref >39)
LDL Chol Calc (NIH): 56 mg/dL (ref 0–99)
Triglycerides: 88 mg/dL (ref 0–149)
VLDL Cholesterol Cal: 17 mg/dL (ref 5–40)

## 2021-09-03 ENCOUNTER — Other Ambulatory Visit: Payer: Self-pay | Admitting: Internal Medicine

## 2021-09-04 ENCOUNTER — Other Ambulatory Visit (HOSPITAL_BASED_OUTPATIENT_CLINIC_OR_DEPARTMENT_OTHER): Payer: Self-pay

## 2021-09-04 MED ORDER — VERAPAMIL HCL ER 180 MG PO TBCR
180.0000 mg | EXTENDED_RELEASE_TABLET | Freq: Every day | ORAL | 3 refills | Status: DC
  Filled 2021-09-04: qty 90, 90d supply, fill #0
  Filled 2021-11-30: qty 90, 90d supply, fill #1
  Filled 2022-02-26 (×2): qty 90, 90d supply, fill #2
  Filled 2022-05-27: qty 90, 90d supply, fill #3

## 2021-09-06 ENCOUNTER — Other Ambulatory Visit (HOSPITAL_BASED_OUTPATIENT_CLINIC_OR_DEPARTMENT_OTHER): Payer: Self-pay

## 2021-09-06 DIAGNOSIS — R1310 Dysphagia, unspecified: Secondary | ICD-10-CM | POA: Diagnosis not present

## 2021-09-06 MED ORDER — METOCLOPRAMIDE HCL 10 MG PO TABS
ORAL_TABLET | ORAL | 0 refills | Status: DC
  Filled 2021-09-06: qty 60, 30d supply, fill #0

## 2021-09-10 ENCOUNTER — Other Ambulatory Visit (HOSPITAL_BASED_OUTPATIENT_CLINIC_OR_DEPARTMENT_OTHER): Payer: Self-pay

## 2021-09-14 ENCOUNTER — Other Ambulatory Visit (HOSPITAL_BASED_OUTPATIENT_CLINIC_OR_DEPARTMENT_OTHER): Payer: Self-pay

## 2021-09-14 MED ORDER — SUCRALFATE 1 GM/10ML PO SUSP
ORAL | 0 refills | Status: DC
  Filled 2021-09-14: qty 420, 21d supply, fill #0

## 2021-09-17 ENCOUNTER — Telehealth: Payer: Self-pay | Admitting: Internal Medicine

## 2021-09-19 ENCOUNTER — Telehealth (HOSPITAL_BASED_OUTPATIENT_CLINIC_OR_DEPARTMENT_OTHER): Payer: Self-pay

## 2021-09-19 NOTE — Telephone Encounter (Signed)
Erroneous

## 2021-09-28 ENCOUNTER — Other Ambulatory Visit (HOSPITAL_BASED_OUTPATIENT_CLINIC_OR_DEPARTMENT_OTHER): Payer: Self-pay

## 2021-09-28 MED ORDER — SUCRALFATE 1 GM/10ML PO SUSP
ORAL | 0 refills | Status: DC
  Filled 2021-09-28: qty 420, 21d supply, fill #0
  Filled 2021-10-01: qty 600, 30d supply, fill #0
  Filled 2021-10-05: qty 600, fill #0

## 2021-10-01 ENCOUNTER — Other Ambulatory Visit (HOSPITAL_BASED_OUTPATIENT_CLINIC_OR_DEPARTMENT_OTHER): Payer: Self-pay

## 2021-10-01 ENCOUNTER — Other Ambulatory Visit (HOSPITAL_BASED_OUTPATIENT_CLINIC_OR_DEPARTMENT_OTHER): Payer: Self-pay | Admitting: Family Medicine

## 2021-10-02 ENCOUNTER — Other Ambulatory Visit (HOSPITAL_BASED_OUTPATIENT_CLINIC_OR_DEPARTMENT_OTHER): Payer: Self-pay

## 2021-10-02 MED ORDER — METOCLOPRAMIDE HCL 10 MG PO TABS
ORAL_TABLET | ORAL | 0 refills | Status: DC
  Filled 2021-10-02: qty 60, 30d supply, fill #0
  Filled 2021-10-05: qty 60, fill #0

## 2021-10-04 ENCOUNTER — Other Ambulatory Visit: Payer: Self-pay | Admitting: Family Medicine

## 2021-10-04 ENCOUNTER — Other Ambulatory Visit (HOSPITAL_BASED_OUTPATIENT_CLINIC_OR_DEPARTMENT_OTHER): Payer: Self-pay

## 2021-10-04 DIAGNOSIS — Z1231 Encounter for screening mammogram for malignant neoplasm of breast: Secondary | ICD-10-CM

## 2021-10-04 DIAGNOSIS — K219 Gastro-esophageal reflux disease without esophagitis: Secondary | ICD-10-CM | POA: Diagnosis not present

## 2021-10-04 MED ORDER — SUCRALFATE 1 G PO TABS
ORAL_TABLET | ORAL | 3 refills | Status: DC
  Filled 2021-10-04: qty 60, 30d supply, fill #0
  Filled 2021-10-29: qty 60, 30d supply, fill #1
  Filled 2021-11-26: qty 60, 30d supply, fill #2
  Filled 2021-12-26: qty 19, 10d supply, fill #3
  Filled 2021-12-26: qty 41, 20d supply, fill #3

## 2021-10-05 ENCOUNTER — Other Ambulatory Visit (HOSPITAL_BASED_OUTPATIENT_CLINIC_OR_DEPARTMENT_OTHER): Payer: Self-pay

## 2021-10-09 ENCOUNTER — Other Ambulatory Visit: Payer: Medicare Other

## 2021-10-09 DIAGNOSIS — Z79899 Other long term (current) drug therapy: Secondary | ICD-10-CM

## 2021-10-10 LAB — LIPID PANEL
Chol/HDL Ratio: 2.2 ratio (ref 0.0–4.4)
Cholesterol, Total: 120 mg/dL (ref 100–199)
HDL: 55 mg/dL (ref >39)
LDL Chol Calc (NIH): 47 mg/dL (ref 0–99)
Triglycerides: 92 mg/dL (ref 0–149)
VLDL Cholesterol Cal: 18 mg/dL (ref 5–40)

## 2021-10-10 LAB — HEPATIC FUNCTION PANEL
ALT: 12 IU/L (ref 0–32)
AST: 20 IU/L (ref 0–40)
Albumin: 4 g/dL (ref 3.8–4.8)
Alkaline Phosphatase: 77 IU/L (ref 44–121)
Bilirubin Total: 0.4 mg/dL (ref 0.0–1.2)
Bilirubin, Direct: 0.15 mg/dL (ref 0.00–0.40)
Total Protein: 6.1 g/dL (ref 6.0–8.5)

## 2021-10-29 ENCOUNTER — Other Ambulatory Visit (HOSPITAL_BASED_OUTPATIENT_CLINIC_OR_DEPARTMENT_OTHER): Payer: Self-pay

## 2021-11-12 ENCOUNTER — Ambulatory Visit
Admission: RE | Admit: 2021-11-12 | Discharge: 2021-11-12 | Disposition: A | Discharge status: Home / Self Care | Payer: Medicare Other | Source: Ambulatory Visit | Attending: Family Medicine | Admitting: Family Medicine

## 2021-11-12 ENCOUNTER — Other Ambulatory Visit (HOSPITAL_BASED_OUTPATIENT_CLINIC_OR_DEPARTMENT_OTHER): Payer: Self-pay

## 2021-11-12 DIAGNOSIS — Z1231 Encounter for screening mammogram for malignant neoplasm of breast: Secondary | ICD-10-CM

## 2021-11-27 ENCOUNTER — Other Ambulatory Visit (HOSPITAL_BASED_OUTPATIENT_CLINIC_OR_DEPARTMENT_OTHER): Payer: Self-pay

## 2021-11-30 ENCOUNTER — Other Ambulatory Visit (HOSPITAL_BASED_OUTPATIENT_CLINIC_OR_DEPARTMENT_OTHER): Payer: Self-pay

## 2021-12-14 ENCOUNTER — Other Ambulatory Visit (HOSPITAL_BASED_OUTPATIENT_CLINIC_OR_DEPARTMENT_OTHER): Payer: Self-pay

## 2021-12-14 ENCOUNTER — Encounter (HOSPITAL_BASED_OUTPATIENT_CLINIC_OR_DEPARTMENT_OTHER): Payer: Self-pay | Admitting: Nurse Practitioner

## 2021-12-14 ENCOUNTER — Ambulatory Visit (INDEPENDENT_AMBULATORY_CARE_PROVIDER_SITE_OTHER): Payer: Medicare Other | Admitting: Nurse Practitioner

## 2021-12-14 VITALS — BP 133/75 | HR 69 | Ht 66.0 in | Wt 163.0 lb

## 2021-12-14 DIAGNOSIS — N3000 Acute cystitis without hematuria: Secondary | ICD-10-CM | POA: Diagnosis not present

## 2021-12-14 LAB — POCT URINALYSIS DIP (CLINITEK)
Bilirubin, UA: NEGATIVE
Blood, UA: NEGATIVE
Glucose, UA: NEGATIVE mg/dL
Ketones, POC UA: NEGATIVE mg/dL
Nitrite, UA: NEGATIVE
POC PROTEIN,UA: NEGATIVE
Spec Grav, UA: 1.01 (ref 1.010–1.025)
Urobilinogen, UA: 0.2 E.U./dL
pH, UA: 5 (ref 5.0–8.0)

## 2021-12-14 MED ORDER — NITROFURANTOIN MONOHYD MACRO 100 MG PO CAPS
100.0000 mg | ORAL_CAPSULE | Freq: Two times a day (BID) | ORAL | 0 refills | Status: AC
  Filled 2021-12-14: qty 20, 10d supply, fill #0

## 2021-12-14 NOTE — Plan of Care (Signed)
Sara [name] complains of burning during urination. Patient hasn't taken azo. Patient denies flank pain,pelvic pain, fever, chills or sweats.

## 2021-12-14 NOTE — Progress Notes (Addendum)
           SherryTilley complains of burning during urination. Patient hasn't taken azo. Patient denies flank pain,pelvic pain, fever, chills or sweats.       Nurse Visit Encounter for dysuria.   POC UA positive for bacteria. Will send treatment with nitrofurantoin. F/U with provider visit if symptoms continue.   I have reviewed this encounter including the documentation in this note and/or discussed this patient with the [place]. All orders and medical decision making have been approved by the supervising primary care provider.   I am certifying that I agree with the content of this note as supervising primary care provider.   Orma Render, DNP, AGNP-c

## 2021-12-14 NOTE — Patient Instructions (Signed)
Per Laretta Bolster I called and spoke to patient to let her know, she was positive for a UTI. Per Laretta Bolster microbid was called to the patient's pharmacy.

## 2021-12-24 ENCOUNTER — Ambulatory Visit (INDEPENDENT_AMBULATORY_CARE_PROVIDER_SITE_OTHER): Payer: Medicare Other

## 2021-12-24 ENCOUNTER — Encounter (HOSPITAL_BASED_OUTPATIENT_CLINIC_OR_DEPARTMENT_OTHER): Payer: Self-pay | Admitting: Family Medicine

## 2021-12-24 ENCOUNTER — Ambulatory Visit (INDEPENDENT_AMBULATORY_CARE_PROVIDER_SITE_OTHER): Payer: Medicare Other | Admitting: Family Medicine

## 2021-12-24 DIAGNOSIS — Z23 Encounter for immunization: Secondary | ICD-10-CM

## 2021-12-24 DIAGNOSIS — M8580 Other specified disorders of bone density and structure, unspecified site: Secondary | ICD-10-CM | POA: Diagnosis not present

## 2021-12-24 DIAGNOSIS — M25552 Pain in left hip: Secondary | ICD-10-CM | POA: Diagnosis not present

## 2021-12-24 NOTE — Assessment & Plan Note (Signed)
Last bone density scan was about 3 years ago, is due for repeat at this time.  She has been continuing with calcium/vitamin D supplementation. Order placed for bone density test to be completed through [facility] imaging.  We will follow-up on results and determine if additional treatment or evaluation is needed based on results

## 2021-12-24 NOTE — Patient Instructions (Signed)
  Medication Instructions:  Your physician recommends that you continue on your current medications as directed. Please refer to the Current Medication list given to you today. --If you need a refill on any your medications before your next appointment, please call your pharmacy first. If no refills are authorized on file call the office.-- Lab Work: Your physician has recommended that you have lab work today: No If you have labs (blood work) drawn today and your tests are completely normal, you will receive your results via [facility] a phone call from our staff.  Please ensure you check your voicemail in the event that you authorized detailed messages to be left on a delegated number. If you have any lab test that is abnormal or we need to change your treatment, we will call you to review the results.  Referrals/Procedures/Imaging: Yes  Follow-Up: Your next appointment:   Your physician recommends that you schedule a follow-up appointment in: 2-3 months follow-up with Dr. de [place]  You will receive a text message or e-mail with a link to a survey about your care and experience with [place] today! We would greatly appreciate your feedback!   Thanks for letting us be apart of your health journey!!  Primary Care and Sports Medicine   Dr. Arlina Robes [place]   We encourage you to activate your patient portal called "MyChart".  Sign up information is provided on this After Visit Summary.  MyChart is used to connect with patients for Virtual Visits (Telemedicine).  Patients are able to view lab/test results, encounter notes, upcoming appointments, etc.  Non-urgent messages can be sent to your provider as well. To learn more about what you can do with MyChart, please visit --  [email or web address].

## 2021-12-24 NOTE — Progress Notes (Signed)
    Procedures performed today:    None.  Independent interpretation of notes and tests performed by another provider:   None.  Brief History, Exam, Impression, and Recommendations:    BP (!) 148/72   Pulse 69   Temp 97.7 F (36.5 C) (Oral)   Ht '5\' 6"'$  (1.676 m)   Wt 162 lb (73.5 kg)   LMP  (LMP Unknown)   SpO2 100%   BMI 26.15 kg/m   Osteopenia Last bone density scan was about 3 years ago, is due for repeat at this time.  She has been continuing with calcium/vitamin D supplementation. Order placed for bone density test to be completed through [facility] imaging.  We will follow-up on results and determine if additional treatment or evaluation is needed based on results  Left hip pain She was having some ongoing left hip pain.  Pain worse with certain activities, mostly along anterior or lateral hip.  Will have some stiffness at times.  She has not had any recent evaluation or imaging performed On exam, no significant tenderness to palpation about the hip.  She does have normal active and passive range of motion.  Negative logroll.  Negative FABER and FADIR.  Good strength testing for flexion, abduction and adduction. At this time, we can proceed with initial x-ray imaging Discussed potential options including physical therapy, home exercises, OTC medications, consideration of injection  Return in about 3 months (around 03/26/2022).   ___________________________________________ Stormy Connon de [place], MD, ABFM, [facility] and [place]

## 2021-12-26 ENCOUNTER — Other Ambulatory Visit (HOSPITAL_BASED_OUTPATIENT_CLINIC_OR_DEPARTMENT_OTHER): Payer: Self-pay

## 2021-12-28 ENCOUNTER — Other Ambulatory Visit (HOSPITAL_BASED_OUTPATIENT_CLINIC_OR_DEPARTMENT_OTHER): Payer: Self-pay

## 2021-12-28 MED ORDER — AREXVY 120 MCG/0.5ML IM SUSR
INTRAMUSCULAR | 0 refills | Status: DC
  Filled 2021-12-28: qty 0.5, 1d supply, fill #0

## 2021-12-31 ENCOUNTER — Ambulatory Visit (HOSPITAL_BASED_OUTPATIENT_CLINIC_OR_DEPARTMENT_OTHER)
Admission: RE | Admit: 2021-12-31 | Discharge: 2021-12-31 | Disposition: A | Discharge status: Home / Self Care | Payer: Medicare Other | Source: Ambulatory Visit | Attending: Family Medicine | Admitting: Family Medicine

## 2021-12-31 ENCOUNTER — Other Ambulatory Visit (HOSPITAL_BASED_OUTPATIENT_CLINIC_OR_DEPARTMENT_OTHER): Payer: Self-pay

## 2021-12-31 DIAGNOSIS — Z780 Asymptomatic menopausal state: Secondary | ICD-10-CM | POA: Insufficient documentation

## 2021-12-31 DIAGNOSIS — M8589 Other specified disorders of bone density and structure, multiple sites: Secondary | ICD-10-CM | POA: Insufficient documentation

## 2021-12-31 DIAGNOSIS — Z13820 Encounter for screening for osteoporosis: Secondary | ICD-10-CM | POA: Diagnosis not present

## 2021-12-31 DIAGNOSIS — M8580 Other specified disorders of bone density and structure, unspecified site: Secondary | ICD-10-CM

## 2022-01-01 NOTE — Assessment & Plan Note (Signed)
She was having some ongoing left hip pain.  Pain worse with certain activities, mostly along anterior or lateral hip.  Will have some stiffness at times.  She has not had any recent evaluation or imaging performed On exam, no significant tenderness to palpation about the hip.  She does have normal active and passive range of motion.  Negative logroll.  Negative FABER and FADIR.  Good strength testing for flexion, abduction and adduction. At this time, we can proceed with initial x-ray imaging Discussed potential options including physical therapy, home exercises, OTC medications, consideration of injection

## 2022-01-03 ENCOUNTER — Other Ambulatory Visit (HOSPITAL_BASED_OUTPATIENT_CLINIC_OR_DEPARTMENT_OTHER): Payer: Self-pay

## 2022-01-03 MED ORDER — COVID-19 MRNA 2023-2024 VACCINE (COMIRNATY) 0.3 ML INJECTION
INTRAMUSCULAR | 0 refills | Status: DC
  Filled 2022-01-03: qty 0.3, 1d supply, fill #0

## 2022-01-08 DIAGNOSIS — I491 Atrial premature depolarization: Secondary | ICD-10-CM | POA: Insufficient documentation

## 2022-01-15 ENCOUNTER — Ambulatory Visit: Payer: Medicare Other | Attending: Internal Medicine | Admitting: Internal Medicine

## 2022-01-15 ENCOUNTER — Other Ambulatory Visit (HOSPITAL_BASED_OUTPATIENT_CLINIC_OR_DEPARTMENT_OTHER): Payer: Self-pay

## 2022-01-15 ENCOUNTER — Encounter: Payer: Self-pay | Admitting: Internal Medicine

## 2022-01-15 VITALS — BP 120/60 | HR 78 | Ht 66.0 in | Wt 160.4 lb

## 2022-01-15 DIAGNOSIS — I491 Atrial premature depolarization: Secondary | ICD-10-CM

## 2022-01-15 DIAGNOSIS — I4719 Other supraventricular tachycardia: Secondary | ICD-10-CM | POA: Diagnosis not present

## 2022-01-15 MED ORDER — ASPIRIN 81 MG PO TBEC
81.0000 mg | DELAYED_RELEASE_TABLET | Freq: Every day | ORAL | 12 refills | Status: DC
  Filled 2022-01-15: qty 30, 30d supply, fill #0
  Filled 2022-02-08: qty 30, 30d supply, fill #1
  Filled 2022-03-11: qty 30, 30d supply, fill #2
  Filled 2022-04-09: qty 30, 30d supply, fill #3
  Filled 2022-05-06: qty 30, 30d supply, fill #4
  Filled 2022-06-03: qty 30, 30d supply, fill #5
  Filled 2022-07-01: qty 30, 30d supply, fill #6
  Filled 2022-07-29: qty 30, 30d supply, fill #7
  Filled 2022-08-26: qty 30, 30d supply, fill #8
  Filled 2022-09-25: qty 30, 30d supply, fill #9

## 2022-01-15 MED ORDER — FUROSEMIDE 20 MG PO TABS
ORAL_TABLET | ORAL | 1 refills | Status: DC
  Filled 2022-01-15: qty 30, 30d supply, fill #0
  Filled 2022-02-08: qty 30, 30d supply, fill #1

## 2022-01-15 NOTE — Progress Notes (Signed)
Patient Care Team: [place], Blondell Reveal, MD as PCP - General (Family Medicine) Deboraha Sprang, MD as PCP - Electrophysiology (Cardiology) Milus Banister, MD (Gastroenterology) Azucena Fallen, MD (Obstetrics and Gynecology) Johnathan Hausen, MD (General Surgery)   HPI  Sara [name] is a 76 y.o. female Seen in follow-up for atrial ectopy and frequent nonsustained runs of atrial tachycardia lasting up to 2 minutes and rates up to 250.  Significant interval improvement 12/22; interval COVID.  Occasional dyspnea with exertion, peripheral edema.  Not much worse post-COVID  Palpitations are much improved on the verapamil.  No constipation  Date Cr K Hgb  5/22 1.15 4.3 14.6   5/23 1.06 4.3     DATE TEST EF   7/22 Echo   55-60 % Asymmetric septal hypertrophy 14/10 mm  8/22 cMRI  57 % Asymmetric septal hypertrophy but only to 12 mm       3/23 CTA-coronaries  Calcium score 929 No obstructive CAD     Records and Results Reviewed   Past Medical History:  Diagnosis Date   Anxiety 07/2020   Arthritis 11/2017   Barrett's esophagus    Cataract    had surgery   GERD (gastroesophageal reflux disease)    hx of    HIATAL HERNIA    s/p surgical repair   Hyperlipidemia    HYPERTENSION    Kidney stone    1990s   Osteoporosis     Past Surgical History:  Procedure Laterality Date   ABDOMINAL HYSTERECTOMY  06/12/2012   CATARACT EXTRACTION  2009   both eyes   CHOLECYSTECTOMY  11/02/2012   Procedure: CHOLECYSTECTOMY;  Surgeon: Pedro Earls, MD;  Location: [facility];  Service: General;;   COLOSTOMY     CYSTOCELE REPAIR N/A 06/12/2012   Procedure: ANTERIOR REPAIR (CYSTOCELE);  Surgeon: Elveria Royals, MD;  Location: [facility];  Service: Gynecology;  Laterality: N/A;   EYE SURGERY  Cataract removal 2009   HERNIA REPAIR  2011   HIATAL HERNIA REPAIR  2011   LITHOTRIPSY     TONSILLECTOMY  1968   UPPER GASTROINTESTINAL ENDOSCOPY     UPPER GI ENDOSCOPY  11/02/2012    Procedure: UPPER GI ENDOSCOPY;  Surgeon: Pedro Earls, MD;  Location: [facility];  Service: General;;   VAGINAL HYSTERECTOMY N/A 06/12/2012   Procedure: HYSTERECTOMY VAGINAL;  Surgeon: Elveria Royals, MD;  Location: [facility];  Service: Gynecology;  Laterality: N/A;    Current Meds  Medication Sig   Ascorbic Acid (VITAMIN C PO) Take 1 tablet by mouth once.   CALCIUM CARB-ERGOCALCIFEROL PO Take 500 mg by mouth daily.   COVID-19 mRNA vaccine 2023-2024 (COMIRNATY) SUSP injection Inject into the muscle.   Cyanocobalamin (VITAMIN B-12 CR PO) Take 1,200 Units by mouth daily.   Multiple Vitamin (MULTIVITAMIN WITH MINERALS) TABS Take 1 tablet by mouth daily.   pantoprazole (PROTONIX) 40 MG tablet Take 1 tablet (40 mg total) by mouth 2 (two) times daily before a meal. Take 20-30 minutes before breakfast and dinner meals.   Probiotic Product (PROBIOTIC DAILY PO) Take 1 capsule by mouth daily.   rosuvastatin (CRESTOR) 5 MG tablet Take 1 tablet (5 mg total) by mouth daily.   RSV vaccine recomb adjuvanted (AREXVY) 120 MCG/0.5ML injection Inject into the muscle.   sucralfate (CARAFATE) 1 g tablet Take 1 tablet (1 g total) by mouth 2 (two) times daily before meals   verapamil (CALAN-SR) 180 MG CR tablet  Take 1 tablet (180 mg total) by mouth daily.   Zoster Vaccine Adjuvanted [facility]) injection Inject into the muscle.    Allergies  Allergen Reactions   Augmentin [Amoxicillin-Pot Clavulanate] Diarrhea      Review of Systems negative except from HPI and PMH  Physical Exam BP 120/60   Pulse 78   Ht '5\' 6"'$  (1.676 m)   Wt 160 lb 6.4 oz (72.8 kg)   LMP  (LMP Unknown)   SpO2 97%   BMI 25.89 kg/m  Well developed and well nourished in no acute distress HENT normal E scleral and icterus clear Neck Supple JVP flat; carotids brisk and full Clear to ausculation Regular rate and rhythm, no murmurs gallops or rub Soft with active bowel sounds No clubbing cyanosis  Edema Alert and oriented, grossly  normal motor and sensory function Skin Warm and Dry  ECG sinus at 78 Intervals 14/08/38 Nonspecific ST-T changes  CrCl cannot be calculated (Patient's most recent lab result is older than the maximum 21 days allowed.).   Assessment and  Plan Atrial ectopy and atrial tachycardia   Dyspnea on exertion question HFpEF   Hypertension  Coronary artery calcifications  Asymmetric septal hypertrophy without criteria for HCM    Ectopy remains well controlled on the verapamil as well as her blood pressure.  We will continue that at 180 mg a day.  With her coronary artery disease, we will continue her statin, last LDL was 47.  We will add low-dose aspirin 3-4 days a week.  No clear volume overload but does have symptoms of dyspnea with associated with edema.  We will try low-dose diuretics as I suspect she has HFpEF.  It does not seem to have been worsened in a sustained way following COVID.        Current medicines are reviewed at length with the patient today .  The patient does not*** have concerns regarding medicines.

## 2022-01-15 NOTE — Patient Instructions (Signed)
Medication Instructions:  Your physician has recommended you make the following change in your medication:   ** Begin Furosemide '20mg'$  - 1 tablet by mouth daily x 3 days then 1 tablet by mouth as needed for fluid.  *If you need a refill on your cardiac medications before your next appointment, please call your pharmacy*   Lab Work: None ordered.  If you have labs (blood work) drawn today and your tests are completely normal, you will receive your results only by: [facility] (if you have MyChart) OR A paper copy in the mail If you have any lab test that is abnormal or we need to change your treatment, we will call you to review the results.   Testing/Procedures: None ordered.    Follow-Up: At [facility], you and your health needs are our priority.  As part of our continuing mission to provide you with exceptional heart care, we have created designated Provider Care Teams.  These Care Teams include your primary Cardiologist (physician) and Advanced Practice Providers (APPs -  Physician Assistants and Nurse Practitioners) who all work together to provide you with the care you need, when you need it.  We recommend signing up for the patient portal called "MyChart".  Sign up information is provided on this After Visit Summary.  MyChart is used to connect with patients for Virtual Visits (Telemedicine).  Patients are able to view lab/test results, encounter notes, upcoming appointments, etc.  Non-urgent messages can be sent to your provider as well.   To learn more about what you can do with MyChart, go to [email or web address].    Your next appointment:   6 months with Dr Caryl Comes  Important Information About Sugar

## 2022-01-21 ENCOUNTER — Other Ambulatory Visit (HOSPITAL_BASED_OUTPATIENT_CLINIC_OR_DEPARTMENT_OTHER): Payer: Self-pay

## 2022-01-28 ENCOUNTER — Other Ambulatory Visit (HOSPITAL_BASED_OUTPATIENT_CLINIC_OR_DEPARTMENT_OTHER): Payer: Self-pay

## 2022-01-28 DIAGNOSIS — M546 Pain in thoracic spine: Secondary | ICD-10-CM | POA: Diagnosis not present

## 2022-01-28 DIAGNOSIS — M25552 Pain in left hip: Secondary | ICD-10-CM | POA: Diagnosis not present

## 2022-01-28 MED ORDER — METHOCARBAMOL 500 MG PO TABS
500.0000 mg | ORAL_TABLET | Freq: Four times a day (QID) | ORAL | 2 refills | Status: DC | PRN
  Filled 2022-01-28: qty 60, 8d supply, fill #0

## 2022-01-29 ENCOUNTER — Other Ambulatory Visit (HOSPITAL_BASED_OUTPATIENT_CLINIC_OR_DEPARTMENT_OTHER): Payer: Self-pay

## 2022-01-30 DIAGNOSIS — M1612 Unilateral primary osteoarthritis, left hip: Secondary | ICD-10-CM | POA: Diagnosis not present

## 2022-01-30 DIAGNOSIS — M25652 Stiffness of left hip, not elsewhere classified: Secondary | ICD-10-CM | POA: Diagnosis not present

## 2022-01-30 DIAGNOSIS — R531 Weakness: Secondary | ICD-10-CM | POA: Diagnosis not present

## 2022-01-30 DIAGNOSIS — M47814 Spondylosis without myelopathy or radiculopathy, thoracic region: Secondary | ICD-10-CM | POA: Diagnosis not present

## 2022-02-04 ENCOUNTER — Other Ambulatory Visit (HOSPITAL_BASED_OUTPATIENT_CLINIC_OR_DEPARTMENT_OTHER): Payer: Self-pay | Admitting: Physician Assistant

## 2022-02-04 ENCOUNTER — Other Ambulatory Visit (HOSPITAL_BASED_OUTPATIENT_CLINIC_OR_DEPARTMENT_OTHER): Payer: Self-pay

## 2022-02-04 MED ORDER — ROSUVASTATIN CALCIUM 5 MG PO TABS
5.0000 mg | ORAL_TABLET | Freq: Every day | ORAL | 3 refills | Status: DC
  Filled 2022-02-11: qty 90, 90d supply, fill #0
  Filled 2022-05-06: qty 90, 90d supply, fill #1
  Filled 2022-08-03 (×2): qty 90, 90d supply, fill #2

## 2022-02-05 DIAGNOSIS — M1612 Unilateral primary osteoarthritis, left hip: Secondary | ICD-10-CM | POA: Diagnosis not present

## 2022-02-05 DIAGNOSIS — M47814 Spondylosis without myelopathy or radiculopathy, thoracic region: Secondary | ICD-10-CM | POA: Diagnosis not present

## 2022-02-05 DIAGNOSIS — M25652 Stiffness of left hip, not elsewhere classified: Secondary | ICD-10-CM | POA: Diagnosis not present

## 2022-02-05 DIAGNOSIS — R531 Weakness: Secondary | ICD-10-CM | POA: Diagnosis not present

## 2022-02-06 DIAGNOSIS — M25652 Stiffness of left hip, not elsewhere classified: Secondary | ICD-10-CM | POA: Diagnosis not present

## 2022-02-06 DIAGNOSIS — M1612 Unilateral primary osteoarthritis, left hip: Secondary | ICD-10-CM | POA: Diagnosis not present

## 2022-02-06 DIAGNOSIS — M47814 Spondylosis without myelopathy or radiculopathy, thoracic region: Secondary | ICD-10-CM | POA: Diagnosis not present

## 2022-02-06 DIAGNOSIS — R531 Weakness: Secondary | ICD-10-CM | POA: Diagnosis not present

## 2022-02-07 DIAGNOSIS — M1612 Unilateral primary osteoarthritis, left hip: Secondary | ICD-10-CM | POA: Diagnosis not present

## 2022-02-08 ENCOUNTER — Other Ambulatory Visit (HOSPITAL_BASED_OUTPATIENT_CLINIC_OR_DEPARTMENT_OTHER): Payer: Self-pay

## 2022-02-11 ENCOUNTER — Other Ambulatory Visit (HOSPITAL_BASED_OUTPATIENT_CLINIC_OR_DEPARTMENT_OTHER): Payer: Self-pay

## 2022-02-12 DIAGNOSIS — M47814 Spondylosis without myelopathy or radiculopathy, thoracic region: Secondary | ICD-10-CM | POA: Diagnosis not present

## 2022-02-12 DIAGNOSIS — R531 Weakness: Secondary | ICD-10-CM | POA: Diagnosis not present

## 2022-02-12 DIAGNOSIS — M1612 Unilateral primary osteoarthritis, left hip: Secondary | ICD-10-CM | POA: Diagnosis not present

## 2022-02-12 DIAGNOSIS — M25652 Stiffness of left hip, not elsewhere classified: Secondary | ICD-10-CM | POA: Diagnosis not present

## 2022-02-18 ENCOUNTER — Telehealth (HOSPITAL_BASED_OUTPATIENT_CLINIC_OR_DEPARTMENT_OTHER): Payer: Self-pay | Admitting: Family Medicine

## 2022-02-18 DIAGNOSIS — M1612 Unilateral primary osteoarthritis, left hip: Secondary | ICD-10-CM | POA: Diagnosis not present

## 2022-02-18 DIAGNOSIS — R531 Weakness: Secondary | ICD-10-CM | POA: Diagnosis not present

## 2022-02-18 DIAGNOSIS — M47814 Spondylosis without myelopathy or radiculopathy, thoracic region: Secondary | ICD-10-CM | POA: Diagnosis not present

## 2022-02-18 DIAGNOSIS — M25652 Stiffness of left hip, not elsewhere classified: Secondary | ICD-10-CM | POA: Diagnosis not present

## 2022-02-18 NOTE — Telephone Encounter (Signed)
Surgery Clearance  Left Anterior Hip Arthroplasty  2 pages  Copied and placed in providers box

## 2022-02-19 ENCOUNTER — Telehealth: Payer: Self-pay

## 2022-02-19 NOTE — Telephone Encounter (Signed)
...     Pre-operative Risk Assessment    Patient Name: Sara [name]  DOB: 06-Sep-1945 MRN: [id number]      Request for Surgical Clearance    Procedure:   RIGHT ANTERIOR HIP ARTHOPLASTY  Date of Surgery:  Clearance TBD                                 Surgeon:  DR Frederik Pear Surgeon's Group or Practice Name:  [facility] Phone number:  [phone] Fax number:  [phone]   Type of Clearance Requested:   - Medical  - Pharmacy:  Hold Aspirin     Type of Anesthesia:  Spinal   Additional requests/questions:    Gwenlyn Found   02/19/2022, 12:04 PM

## 2022-02-20 ENCOUNTER — Telehealth (HOSPITAL_BASED_OUTPATIENT_CLINIC_OR_DEPARTMENT_OTHER): Payer: Self-pay | Admitting: Family

## 2022-02-20 DIAGNOSIS — M25652 Stiffness of left hip, not elsewhere classified: Secondary | ICD-10-CM | POA: Diagnosis not present

## 2022-02-20 DIAGNOSIS — M1612 Unilateral primary osteoarthritis, left hip: Secondary | ICD-10-CM | POA: Diagnosis not present

## 2022-02-20 DIAGNOSIS — M47814 Spondylosis without myelopathy or radiculopathy, thoracic region: Secondary | ICD-10-CM | POA: Diagnosis not present

## 2022-02-20 DIAGNOSIS — R531 Weakness: Secondary | ICD-10-CM | POA: Diagnosis not present

## 2022-02-20 NOTE — Telephone Encounter (Signed)
  Patient Consent for Virtual Visit        Sara [name] has provided verbal consent on 02/20/2022 for a virtual visit (video or telephone).   CONSENT FOR VIRTUAL VISIT FOR:  Sara [name]  By participating in this virtual visit I agree to the following:  I hereby voluntarily request, consent and authorize [facility] and its employed or contracted physicians, physician assistants, nurse practitioners or other licensed health care professionals (the Practitioner), to provide me with telemedicine health care services (the "Services") as deemed necessary by the treating Practitioner. I acknowledge and consent to receive the Services by the Practitioner via telemedicine. I understand that the telemedicine visit will involve communicating with the Practitioner through live audiovisual communication technology and the disclosure of certain medical information by electronic transmission. I acknowledge that I have been given the opportunity to request an in-person assessment or other available alternative prior to the telemedicine visit and am voluntarily participating in the telemedicine visit.  I understand that I have the right to withhold or withdraw my consent to the use of telemedicine in the course of my care at any time, without affecting my right to future care or treatment, and that the Practitioner or I may terminate the telemedicine visit at any time. I understand that I have the right to inspect all information obtained and/or recorded in the course of the telemedicine visit and may receive copies of available information for a reasonable fee.  I understand that some of the potential risks of receiving the Services via telemedicine include:  Delay or interruption in medical evaluation due to technological equipment failure or disruption; Information transmitted may not be sufficient (e.g. poor resolution of images) to allow for appropriate medical decision making by the  Practitioner; and/or  In rare instances, security protocols could fail, causing a breach of personal health information.  Furthermore, I acknowledge that it is my responsibility to provide information about my medical history, conditions and care that is complete and accurate to the best of my ability. I acknowledge that Practitioner's advice, recommendations, and/or decision may be based on factors not within their control, such as incomplete or inaccurate data provided by me or distortions of diagnostic images or specimens that may result from electronic transmissions. I understand that the practice of medicine is not an exact science and that Practitioner makes no warranties or guarantees regarding treatment outcomes. I acknowledge that a copy of this consent can be made available to me via my patient portal ([facility]), or I can request a printed copy by calling the office of [facility].    I understand that my insurance will be billed for this visit.   I have read or had this consent read to me. I understand the contents of this consent, which adequately explains the benefits and risks of the Services being provided via telemedicine.  I have been provided ample opportunity to ask questions regarding this consent and the Services and have had my questions answered to my satisfaction. I give my informed consent for the services to be provided through the use of telemedicine in my medical care

## 2022-02-20 NOTE — Telephone Encounter (Signed)
Pt is returning call. Transferred to Laurann [place], NP.

## 2022-02-20 NOTE — Telephone Encounter (Signed)
Scheduled for tele visit. Econsent entered and med list reviewed.   Loel Dubonnet, NP

## 2022-02-20 NOTE — Telephone Encounter (Signed)
Left VM requesting call back.   Ellarae Nevitt S Tiphani Mells, NP  

## 2022-02-20 NOTE — Telephone Encounter (Signed)
   Name: Sara [name]  DOB: 02-14-1946  MRN: [id number]  Primary Cardiologist: None  Chart reviewed as part of pre-operative protocol coverage. Because of [name]'s past medical history and time since last visit, she will require a follow-up phone-office visit in order to better assess preoperative cardiovascular risk.  Last seen 01/15/22 by Dr. Caryl Comes and started on diuretic for possible HFpEF due to dyspnea, edema.   CAD by prior CTA but no prior intervention. Per office protocol, may hold Aspirin 5-7 days prior to planned procedure.   Pre-op covering staff: - Please schedule appointment and call patient to inform them. If patient already had an upcoming appointment within acceptable timeframe, please add "pre-op clearance" to the appointment notes so provider is aware. - Please contact requesting surgeon's office via preferred method (i.e, phone, fax) to inform them of need for appointment prior to surgery.   Loel Dubonnet, NP  02/20/2022, 8:27 AM

## 2022-02-25 DIAGNOSIS — R531 Weakness: Secondary | ICD-10-CM | POA: Diagnosis not present

## 2022-02-25 DIAGNOSIS — M546 Pain in thoracic spine: Secondary | ICD-10-CM | POA: Diagnosis not present

## 2022-02-25 DIAGNOSIS — M25652 Stiffness of left hip, not elsewhere classified: Secondary | ICD-10-CM | POA: Diagnosis not present

## 2022-02-25 DIAGNOSIS — M47814 Spondylosis without myelopathy or radiculopathy, thoracic region: Secondary | ICD-10-CM | POA: Diagnosis not present

## 2022-02-25 DIAGNOSIS — M1612 Unilateral primary osteoarthritis, left hip: Secondary | ICD-10-CM | POA: Diagnosis not present

## 2022-02-26 ENCOUNTER — Encounter: Payer: Self-pay | Admitting: Physician Assistant

## 2022-02-26 ENCOUNTER — Ambulatory Visit: Payer: Medicare Other | Admitting: Physician Assistant

## 2022-02-26 ENCOUNTER — Other Ambulatory Visit (HOSPITAL_BASED_OUTPATIENT_CLINIC_OR_DEPARTMENT_OTHER): Payer: Self-pay

## 2022-02-26 NOTE — Progress Notes (Addendum)
Cardiology Office Note    Date:  02/27/2022   ID:  Sara [name], DOB 11/24/1945, MRN [id number]  PCP:  de [place], Raymond J, MD  Cardiologist:  None  Electrophysiologist:  Virl Axe, MD   Chief Complaint: preop evaluation  History of Present Illness:   Sara [name] is a 76 y.o. female with history of asymmetric septal hypertrophy (not meeting criteria for HCM by cMRI 2022), SVT/PAT/atrial ectopy, presumed nonobstructive CAD by cor CT 05/2021, anxiety, arthritis, Barrett's esophagus, GERD, hiatal hernia s/p repair with subsequent recurrence, HLD, HTN, osteoporosis, kidney stone seen for preop clearance today.  Prior studies reviewed - had event monitor 07/2020 with frequent runs of SVT, frequent PACs, and rare PVCs prompting management by EP, has been maintained on verampail. Echo 09/2020 EF 55-60%, moderate asymmetric left ventricular hypertrophy of the basal-septal segmen, normal RV, mild dilation of ascending aorta. Stress test 10/2020 was abnormal but low risk with possible prior inferior infarct and minimal peri-infarct ischemia, managed medically. cMRI 10/2020 showed asymmetric LV hypertrophy measuring 26m in basal septum (546min posterior wall), did not meet criteria for HCM, no late gadolinium enhancement to suggest myocardial scar.Of note, while echo suggested mild dilation of ascending aorta, MRI showed normal proximal ascending aorta. Cor CT 05/2021 done for chest pain showed CAC 929 in 95%ile, "significant artifact obscuring the mid to distal vessels (RCA, LAD, LCX). Unable to comment on the mid to distal RCA. Probably no severe stenosis in the mid to distal LAD and LCX. With artifact, will not send for FFR. Overall, no definite severe stenosis, but given the degree of artifact, would consider functional study." Study did show aortic atherosclerosis and large hiatal hernia. No additional workup recommended in follow-up. At last OV 01/15/22 she was noting some symptoms of dyspnea  and edema. Dr. KlCaryl Comesuspected HFpEF and started Lasix short course with PRN thereafter.  She is now pending right anterior hip arthroplasty therefore presents for preop evaluation. Overall she feels she is doing well. She reports that the DOE started about 4 months ago, has not gotten any worse since that time, perhaps slightly better with the Lasix. She hasn't had to take it much over the last few weeks as edema no longer present. She has no PND or weight gain. She has had intermittent breakthrough palpitations though very infrequent at this point. She denies any further chest pain like she was describing earlier this year - resolved without specific intervention. She wonders to what degree her hiatal hernia is contributing. She reports hip surgery not planned until after the new year, date TBD.   Labwork independently reviewed: 09/2021 LFTs wnl, LDL 47 07/2021 CBC wnl, TSH OK, A1C 5.1  Cardiology Studies:   Studies reviewed are outlined and summarized above. Reports included below if pertinent.   Cor CT 05/2021  IMPRESSION: 1. Coronary artery calcium score 929 Agatston units. This places the patient in the 95th percentile for age and gender, suggesting high risk for future cardiac events.   2. Significant artifact obscuring the mid to distal vessels (RCA, LAD, LCX). Unable to comment on the mid to distal RCA. Probably no severe stenosis in the mid to distal LAD and LCX. With artifact, will not send for FFR.   Overall, no definite severe stenosis, but given the degree of artifact, would consider functional study.   Dalton Mclean     Electronically Signed   By: DaLoralie Champagne.D.   On: 05/29/2021 14:15  CMRI 10/2020  IMPRESSION: 1.  Asymmetric LV hypertrophy measuring 40m in basal septum (577min posterior wall). Does not meet criteria for hypertrophic cardiomyopathy (less than 1541mall thickness)   2.  No lata gadolinium enhancement to suggest myocardial scar   3.  Normal  LV size and systolic function (EF 55%40% 4.  Normal RV size and systolic function (EF 61%97% 5.  Large hiatal hernia     Electronically Signed   By: ChrOswaldo Milian   On: 11/02/2020 15:49  Nuc 09/2020 The left ventricular ejection fraction is normal (55-65%). Nuclear stress EF: 60%. There was no ST segment deviation noted during stress. Defect 1: There is a medium defect of severe severity present in the basal inferoseptal, basal inferior, mid inferoseptal and mid inferior location. Findings consistent with prior myocardial infarction with peri-infarct ischemia. This is a low risk study.   Abnormal, low risk stress nuclear study with prior inferior infarct and minimal peri-infarct ischemia.  Gated ejection fraction 60% with hypokinesis of the basal inferior wall.  Echo 09/2020   1. Left ventricular ejection fraction, by estimation, is 55 to 60%. Left  ventricular ejection fraction by 3D volume is 57 %. The left ventricle has  normal function. The left ventricle has no regional wall motion  abnormalities. There is moderate  asymmetric left ventricular hypertrophy of the basal-septal segment. Left  ventricular diastolic parameters are indeterminate. The average left  ventricular global longitudinal strain is -21.2 %.   2. Right ventricular systolic function is normal. The right ventricular  size is normal. There is normal pulmonary artery systolic pressure. The  estimated right ventricular systolic pressure is 28.35.3Hg.   3. The mitral valve is normal in structure. Trivial mitral valve  regurgitation. No evidence of mitral stenosis.   4. The aortic valve is tricuspid. Aortic valve regurgitation is not  visualized. Mild aortic valve sclerosis is present, with no evidence of  aortic valve stenosis.   5. Aortic dilatation noted. There is mild dilatation of the ascending  aorta, measuring 38 mm.   6. The inferior vena cava is normal in size with greater than 50%   respiratory variability, suggesting right atrial pressure of 3 mmHg.  Monitor 07/2020 Normal sinus rhythm with rare PACs, PVCs. Paroxysmal SVT with HR up to 266 bpm, and rhythm lasting up to 2 minutes. No atrial fibrillation.   Consider referral to electrophysiology for further management.    Patch Wear Time:  12 days and 20 hours (2022-05-04T11:46:22-0400 to 2022-05-17T07:46:35-0400)   Patient had a min HR of 48 bpm, max HR of 266 bpm, and avg HR of 65 bpm. Predominant underlying rhythm was Sinus Rhythm. 417 Supraventricular Tachycardia runs occurred, the run with the fastest interval lasting 1 min 51 secs with a max rate of 266 bpm,  the longest lasting 2 mins 1 sec with an avg rate of 216 bpm. Supraventricular Tachycardia was detected within +/- 45 seconds of symptomatic patient event(s). Isolated SVEs were frequent (6.9%, 79060), SVE Couplets were occasional (1.0%, 5773), and SVE  Triplets were rare (<1.0%, 555). Isolated VEs were rare (<1.0%, 440), VE Couplets were rare (<1.0%, 20), and VE Triplets were rare (<1.0%, 6). MD notification criteria for Supraventricular Tachycardia met - report posted prior to notification per account  request (AA).     Past Medical History:  Diagnosis Date   Anxiety 07/2020   Arthritis 11/2017   Barrett's esophagus    CAD (coronary artery disease)    Cataract    had surgery  GERD (gastroesophageal reflux disease)    hx of    HIATAL HERNIA    s/p surgical repair   Hyperlipidemia    HYPERTENSION    Kidney stone    1990s   LVH (left ventricular hypertrophy)    Osteoporosis    PAT (paroxysmal atrial tachycardia)    Premature atrial contractions    Wellness examination 08/22/2021    Past Surgical History:  Procedure Laterality Date   ABDOMINAL HYSTERECTOMY  06/12/2012   CATARACT EXTRACTION  2009   both eyes   CHOLECYSTECTOMY  11/02/2012   Procedure: CHOLECYSTECTOMY;  Surgeon: Pedro Earls, MD;  Location: [facility];  Service: General;;    COLOSTOMY     CYSTOCELE REPAIR N/A 06/12/2012   Procedure: ANTERIOR REPAIR (CYSTOCELE);  Surgeon: Elveria Royals, MD;  Location: [place] [facility];  Service: Gynecology;  Laterality: N/A;   EYE SURGERY  Cataract removal 2009   HERNIA REPAIR  2011   HIATAL HERNIA REPAIR  2011   LITHOTRIPSY     TONSILLECTOMY  1968   UPPER GASTROINTESTINAL ENDOSCOPY     UPPER GI ENDOSCOPY  11/02/2012   Procedure: UPPER GI ENDOSCOPY;  Surgeon: Pedro Earls, MD;  Location: [facility];  Service: General;;   VAGINAL HYSTERECTOMY N/A 06/12/2012   Procedure: HYSTERECTOMY VAGINAL;  Surgeon: Elveria Royals, MD;  Location: [facility];  Service: Gynecology;  Laterality: N/A;    Current Medications: Current Meds  Medication Sig   aspirin EC 81 MG tablet Take 1 tablet (81 mg total) by mouth daily.   COVID-19 mRNA vaccine 2023-2024 (COMIRNATY) SUSP injection Inject into the muscle.   Cyanocobalamin (VITAMIN B-12 CR PO) Take 1,200 Units by mouth daily.   furosemide (LASIX) 20 MG tablet Take 1 tablet by mouth daily for 3 days, then take as needed for fluid   methocarbamol (ROBAXIN) 500 MG tablet Take 1-2 tablets (500-1,000 mg total) by mouth every 6 to 8 hours as needed for muscle spasms/tension.   Multiple Vitamin (MULTIVITAMIN WITH MINERALS) TABS Take 1 tablet by mouth daily.   Probiotic Product (PROBIOTIC DAILY PO) Take 1 capsule by mouth daily.   rosuvastatin (CRESTOR) 5 MG tablet Take 1 tablet (5 mg total) by mouth daily.   RSV vaccine recomb adjuvanted (AREXVY) 120 MCG/0.5ML injection Inject into the muscle.   verapamil (CALAN-SR) 180 MG CR tablet Take 1 tablet (180 mg total) by mouth daily.   Zoster Vaccine Adjuvanted [facility]) injection Inject into the muscle.      Allergies:   Augmentin [amoxicillin-pot clavulanate]   Social History   Socioeconomic History   Marital status: Widowed    Spouse name: Not on file   Number of children: 5   Years of education: 14   Highest education level: Not on file  Occupational  History   Occupation: Retired  Tobacco Use   Smoking status: Never    Passive exposure: Past   Smokeless tobacco: Never  Vaping Use   Vaping Use: Never used  Substance and Sexual Activity   Alcohol use: Never   Drug use: Never   Sexual activity: Not Currently    Birth control/protection: Post-menopausal    Comment: Hysterectomy  Other Topics Concern   Not on file  Social History Narrative   widowed since late 2011 (spouse had mod-adv dementia) 5 grown children, 3 nearby-,many g-kids   Social Determinants of Health   Financial Resource Strain: Not on file  Food Insecurity: Not on file  Transportation Needs: Not on file  Physical Activity:  Not on file  Stress: Not on file  Social Connections: Not on file     Family History:  The patient's family history includes Asthma in her maternal grandmother; Breast cancer in her maternal grandmother and another family member; Breast cancer (age of onset: 29) in her mother; Cancer in her maternal grandmother, mother, paternal aunt, and paternal uncle; Colon cancer in her paternal aunt; Early death in her father and paternal uncle; Heart disease in her father and another family member; Hypertension in her mother and another family member; Kidney disease in her father. There is no history of Esophageal cancer, Rectal cancer, Stomach cancer, or Colon polyps.  ROS:   Please see the history of present illness All other systems are reviewed and otherwise negative.    EKG(s)/Additional Labs   EKG:  EKG is ordered today, personally reviewed, demonstrating NSR 65bpm, nonspecific TW changes, similar to prior.  Recent Labs: 08/14/2021: BUN 14; Creatinine, Ser 1.06; Hemoglobin 13.9; Platelets 235; Potassium 4.3; Sodium 139; TSH 0.913 10/09/2021: ALT 12  Recent Lipid Panel    Component Value Date/Time   CHOL 120 10/09/2021 0720   TRIG 92 10/09/2021 0720   HDL 55 10/09/2021 0720   CHOLHDL 2.2 10/09/2021 0720   CHOLHDL 2 05/31/2020 0841   VLDL  19.6 05/31/2020 0841   LDLCALC 47 10/09/2021 0720    PHYSICAL EXAM:    VS:  BP 130/74 (BP Location: Left Arm, Patient Position: Sitting, Cuff Size: Normal)   Pulse 65   Ht _0  (1.676 m)   Wt 164 lb (74.4 kg)   LMP  (LMP Unknown)   BMI 26.47 kg/m   BMI: Body mass index is 26.47 kg/m.  GEN: Well nourished, well developed female in no acute distress HEENT: normocephalic, atraumatic Neck: no JVD, carotid bruits, or masses Cardiac: RRR with occasional ectopy, no murmurs, rubs, or gallops, no edema  Respiratory:  clear to auscultation bilaterally, normal work of breathing GI: soft, nontender, nondistended, + BS MS: no deformity or atrophy Skin: warm and dry, no rash Neuro:  Alert and Oriented x 3, Strength and sensation are intact, follows commands Psych: euthymic mood, full affect  Wt Readings from Last 3 Encounters:  02/27/22 164 lb (74.4 kg)  01/15/22 160 lb 6.4 oz (72.8 kg)  12/24/21 162 lb (73.5 kg)     ASSESSMENT & PLAN:   1. Preop cardiovascular evaluation, also dyspnea on exertion - she reports this has been going on about 4 months, coinciding with when her hip began giving her trouble. She had prior intermediate stress test and nondiagnostic coronary CTA. She is not having any further chest pain. I will reach out to Dr. Caryl Comes to find out if he feels she needs any more definitive ischemic evaluation. In the interim we will also update her echocardiogram as she also had mild dilation of the aorta that needs follow-up. She states one of the preop questions was whether she should have her surgery outpt at surgical center or inpatient. Given arrhythmia history, I suspect on-site surgery would be a safer option. We will finalize clearance at follow-up visit. We will also update basic labs today to ensure no metabolic reason for dyspnea, and f/u K/Cr/BNP on Lasix. Addendum 03/07/2022: Dr. Caryl Comes suggests cardiac PET. Spoke with pt regarding this recommendation and she is agreeable.  Will send request to schedule in phone note dated from today. Will also fax copy of this updated note to requesting surgeon.  2. PACs/PAT - she reports generally good control on  verapamil compared to symptoms before verapamil. Would encourage her to continue this. Update lytes, thyroid given periodic breakthrough symptoms.  3. CAD by cor CT 05/2021 - will review further management with Dr. Caryl Comes as above. Continue ASA, statin. LDL at goal by last check earlier this year.  4. LVH - previously evaluated by cardiac MRI, not felt to reflect HCM. She is maintained on verapamil. Avoid dehydration. She only takes Lasix PRN at this point.    Disposition: F/u with me, Dr. Caryl Comes or Tommye Standard after echo to finalize preop recommendations.   Medication Adjustments/Labs and Tests Ordered: Current medicines are reviewed at length with the patient today.  Concerns regarding medicines are outlined above. Medication changes, Labs and Tests ordered today are summarized above and listed in the Patient Instructions accessible in Encounters.   Signed, Charlie Pitter, PA-C  02/27/2022 2:04 PM    [place] Phone: [phone]; Fax: [phone]

## 2022-02-26 NOTE — Telephone Encounter (Addendum)
   Name: Sara [name]  DOB: 09/30/1945  MRN: [id number]  Primary Cardiologist: Dr. Caryl Comes  Chart reviewed as part of pre-operative protocol coverage. Because of Sara [name]'s past medical history and time since last visit, she will require a follow-up in-office visit in order to better assess preoperative cardiovascular risk. She was originally on for tele visit today. However, per algorithm, if patient was having symptoms at last OV, needs f/u in person visit before clearing. At last OV, patient was having increased SOB so Dr. Caryl Comes started Lasix. She has not had follow-up labs ordered. Therefore needs in-person visit.  Pre-op covering staff: - Please cancel virtual visit. Schedule in person appointment and call patient to inform them. If patient already had an upcoming appointment within acceptable timeframe, please add "pre-op clearance" to the appointment notes so provider is aware. OK to see gen cards APP. - Please contact requesting surgeon's office via preferred method (i.e, phone, fax) to inform them of need for appointment prior to surgery.  ASA already reviewed below.  Charlie Pitter, PA-C  02/26/2022, 7:50 AM

## 2022-02-26 NOTE — Progress Notes (Signed)
This encounter was created in error - please disregard. Needs in-person OV, see clearance phone note.

## 2022-02-26 NOTE — Telephone Encounter (Signed)
I s/w the pt and she is agreeable to in office appt per the pre op provider today Melina Copa, PAC. Pt has been scheduled to see Melina Copa, [facility] 02/27/22 @ 1:55. Pt thanked me for the help and the call. See notes from pre op APP. I will update all parties involved in this matter.

## 2022-02-26 NOTE — Progress Notes (Signed)
Covering preop today. She was originally on for tele visit today. However, per algorithm, if patient was having symptoms at last OV, needs f/u in person visit before clearing. At last OV, patient was having increased SOB so Dr. Caryl Comes started Lasix. She has not had follow-up labs ordered. Therefore needs in-person visit. Have routed to callback in original phone note for clearance to cancel virtual and schedule in-person OV.

## 2022-02-27 ENCOUNTER — Ambulatory Visit: Payer: Medicare Other | Attending: Physician Assistant | Admitting: Physician Assistant

## 2022-02-27 ENCOUNTER — Encounter: Payer: Self-pay | Admitting: Physician Assistant

## 2022-02-27 VITALS — BP 130/74 | HR 65 | Ht 66.0 in | Wt 164.0 lb

## 2022-02-27 DIAGNOSIS — I517 Cardiomegaly: Secondary | ICD-10-CM

## 2022-02-27 DIAGNOSIS — I2510 Atherosclerotic heart disease of native coronary artery without angina pectoris: Secondary | ICD-10-CM | POA: Diagnosis not present

## 2022-02-27 DIAGNOSIS — R531 Weakness: Secondary | ICD-10-CM | POA: Diagnosis not present

## 2022-02-27 DIAGNOSIS — M1612 Unilateral primary osteoarthritis, left hip: Secondary | ICD-10-CM | POA: Diagnosis not present

## 2022-02-27 DIAGNOSIS — Z01810 Encounter for preprocedural cardiovascular examination: Secondary | ICD-10-CM

## 2022-02-27 DIAGNOSIS — I493 Ventricular premature depolarization: Secondary | ICD-10-CM

## 2022-02-27 DIAGNOSIS — I491 Atrial premature depolarization: Secondary | ICD-10-CM | POA: Diagnosis not present

## 2022-02-27 DIAGNOSIS — R0609 Other forms of dyspnea: Secondary | ICD-10-CM

## 2022-02-27 DIAGNOSIS — M47814 Spondylosis without myelopathy or radiculopathy, thoracic region: Secondary | ICD-10-CM | POA: Diagnosis not present

## 2022-02-27 DIAGNOSIS — M25652 Stiffness of left hip, not elsewhere classified: Secondary | ICD-10-CM | POA: Diagnosis not present

## 2022-02-27 DIAGNOSIS — I4719 Other supraventricular tachycardia: Secondary | ICD-10-CM | POA: Diagnosis not present

## 2022-02-27 NOTE — Patient Instructions (Addendum)
Medication Instructions:    Your physician recommends that you continue on your current medications as directed. Please refer to the Current Medication list given to you today.  *If you need a refill on your cardiac medications before your next appointment, please call your pharmacy*   Lab Work: TODAY :   BMET MAG BNP CBC AND TSH TODAY   If you have labs (blood work) drawn today and your tests are completely normal, you will receive your results only by: [place] (if you have MyChart) OR A paper copy in the mail If you have any lab test that is abnormal or we need to change your treatment, we will call you to review the results.   Testing/Procedures: Your physician has requested that you have an echocardiogram. Echocardiography is a painless test that uses sound waves to create images of your heart. It provides your doctor with information about the size and shape of your heart and how well your heart's chambers and valves are working. This procedure takes approximately one hour. There are no restrictions for this procedure. Please do NOT wear cologne, perfume, aftershave, or lotions (deodorant is allowed). Please arrive 15 minutes prior to your appointment time.   Follow-Up: At [facility], you and your health needs are our priority.  As part of our continuing mission to provide you with exceptional heart care, we have created designated Provider Care Teams.  These Care Teams include your primary Cardiologist (physician) and Advanced Practice Providers (APPs -  Physician Assistants and Nurse Practitioners) who all work together to provide you with the care you need, when you need it.  We recommend signing up for the patient portal called "MyChart".  Sign up information is provided on this After Visit Summary.  MyChart is used to connect with patients for Virtual Visits (Telemedicine).  Patients are able to view lab/test results, encounter notes, upcoming appointments, etc.   Non-urgent messages can be sent to your provider as well.   To learn more about what you can do with MyChart, go to [email or web address].    Your next appointment:  AFTER ECHOCARDIOGRAM  1-2 week(s)  The format for your next appointment:   In Person  Provider:   You may see Virl Axe, MD or one of the following Advanced Practice Providers on your designated Care Team:   Tommye Standard, [place]                          OR   Lisbeth Renshaw Dunn  PA-C    Other Instructions   Important Information About Sugar

## 2022-02-28 LAB — BASIC METABOLIC PANEL
BUN/Creatinine Ratio: 13 (ref 12–28)
BUN: 15 mg/dL (ref 8–27)
CO2: 22 mmol/L (ref 20–29)
Calcium: 9.5 mg/dL (ref 8.7–10.3)
Chloride: 105 mmol/L (ref 96–106)
Creatinine, Ser: 1.13 mg/dL — ABNORMAL HIGH (ref 0.57–1.00)
Glucose: 81 mg/dL (ref 70–99)
Potassium: 4.6 mmol/L (ref 3.5–5.2)
Sodium: 142 mmol/L (ref 134–144)
eGFR: 51 mL/min/{1.73_m2} — ABNORMAL LOW (ref >59)

## 2022-02-28 LAB — CBC
Hematocrit: 40.6 % (ref 34.0–46.6)
Hemoglobin: 13.8 g/dL (ref 11.1–15.9)
MCH: 30.9 pg (ref 26.6–33.0)
MCHC: 34 g/dL (ref 31.5–35.7)
MCV: 91 fL (ref 79–97)
Platelets: 266 10*3/uL (ref 150–450)
RBC: 4.47 x10E6/uL (ref 3.77–5.28)
RDW: 12.1 % (ref 11.7–15.4)
WBC: 7.9 10*3/uL (ref 3.4–10.8)

## 2022-02-28 LAB — MAGNESIUM: Magnesium: 2 mg/dL (ref 1.6–2.3)

## 2022-02-28 LAB — TSH: TSH: 1.05 u[IU]/mL (ref 0.450–4.500)

## 2022-02-28 LAB — PRO B NATRIURETIC PEPTIDE: NT-Pro BNP: 203 pg/mL (ref 0–301)

## 2022-03-05 ENCOUNTER — Ambulatory Visit (HOSPITAL_BASED_OUTPATIENT_CLINIC_OR_DEPARTMENT_OTHER): Payer: Medicare Other | Admitting: Family Medicine

## 2022-03-06 ENCOUNTER — Encounter (HOSPITAL_BASED_OUTPATIENT_CLINIC_OR_DEPARTMENT_OTHER): Payer: Self-pay | Admitting: Family Medicine

## 2022-03-06 ENCOUNTER — Ambulatory Visit (INDEPENDENT_AMBULATORY_CARE_PROVIDER_SITE_OTHER): Payer: Medicare Other | Admitting: Family Medicine

## 2022-03-06 VITALS — BP 128/69 | HR 68 | Temp 97.7°F | Ht 66.0 in | Wt 162.4 lb

## 2022-03-06 DIAGNOSIS — Z01818 Encounter for other preprocedural examination: Secondary | ICD-10-CM | POA: Insufficient documentation

## 2022-03-06 NOTE — Assessment & Plan Note (Signed)
Based on history and exam will order the following preoperative tests: none today. Patient does have some new cardiopulmonary symptoms and is currently undergoing evaluation with cardiology.  Otherwise, patient is cleared to proceed with planned surgery from my standpoint, however she will need to have evaluation completed as arranged with cardiology to further determine any recommendations or restrictions as per cardiology.

## 2022-03-06 NOTE — Progress Notes (Signed)
    Procedures performed today:    None.  Independent interpretation of notes and tests performed by another provider:   None.  Brief History, Exam, Impression, and Recommendations:    Sara [name] is a 76 y.o. presenting for preoperative evaluation/clearance. The planned procedure is left hip arthroplasty with the treatment goal of reduced pain and improved function. Cardiac risk for planned procedure is Intermediate (1 to 5%) - intraperitoneal or intrathoracic surgery, carotid endarterectomy, head and neck surgery, orthopedic surgery, prostate surgery  New or unstable cardiopulmonary signs or symptoms? Yes - patient reporting that she has had some shortness of breath.  She has discussed this with her cardiologist and currently is undergoing further evaluation related to this Signs or symptoms of cardiovascular disease?  As above Urinary symptoms or invasive urologic procedure? No  BP 128/69 (BP Location: Left Arm, Patient Position: Sitting, Cuff Size: Large)   Pulse 68   Temp 97.7 F (36.5 C) (Oral)   Ht '5\' 6"'$  (1.676 m)   Wt 162 lb 6.4 oz (73.7 kg)   LMP  (LMP Unknown)   SpO2 98%   BMI 26.21 kg/m   Exam: Patient in no acute distress today, vital signs stable Cardiovascular exam with regular rate, no murmur appreciated Lungs clear to auscultation bilaterally  Preoperative clearance Based on history and exam will order the following preoperative tests: none today. Patient does have some new cardiopulmonary symptoms and is currently undergoing evaluation with cardiology.  Otherwise, patient is cleared to proceed with planned surgery from my standpoint, however she will need to have evaluation completed as arranged with cardiology to further determine any recommendations or restrictions as per cardiology.   ___________________________________________ Sekai Gitlin de [place], MD, ABFM, CAQSM [facility] and [place]

## 2022-03-07 ENCOUNTER — Telehealth: Payer: Self-pay | Admitting: Physician Assistant

## 2022-03-07 DIAGNOSIS — R0609 Other forms of dyspnea: Secondary | ICD-10-CM

## 2022-03-07 DIAGNOSIS — Z01810 Encounter for preprocedural cardiovascular examination: Secondary | ICD-10-CM

## 2022-03-07 NOTE — Telephone Encounter (Signed)
Reviewed clinical situation with Dr. Caryl Comes who suggests we obtain cardiac PET to clarify the issue of possible ischemia by prior nuc, CT. This is in addition to the echo we have planned. I reached out to patient but got VM. LMTCB to consent for test.

## 2022-03-07 NOTE — Telephone Encounter (Signed)
Attestation order already, placed will remove duplicate

## 2022-03-07 NOTE — Telephone Encounter (Signed)
Order placed for PET NM CT and attestation order to be signed by APP. Message to [facility] PET CT POOL for scheduling.

## 2022-03-07 NOTE — Telephone Encounter (Signed)
Spoke with patient regarding cardiac PET scan. She got Lexiscan back in 09/2020 and did well. We did discuss that sometimes this can be scheduled weeks perhaps even over a month out.  Shared Decision Making/Informed Consent The risks [chest pain, shortness of breath, cardiac arrhythmias, dizziness, blood pressure fluctuations, myocardial infarction, stroke/transient ischemic attack, nausea, vomiting, allergic reaction, radiation exposure, metallic taste sensation and life-threatening complications (estimated to be 1 in 10,000)], benefits (risk stratification, diagnosing coronary artery disease, treatment guidance) and alternatives of a cardiac PET stress test were discussed in detail with Sara [name] and she agrees to proceed.  Will route to our triage team to help [place] get cardiac PET ordered and scheduled for her. We'll keep plan for echo. I addended my OV note to reflect plan for cardiac PET and routed to surgeon to keep them informed.

## 2022-03-11 ENCOUNTER — Other Ambulatory Visit: Payer: Self-pay | Admitting: Internal Medicine

## 2022-03-11 ENCOUNTER — Other Ambulatory Visit (HOSPITAL_BASED_OUTPATIENT_CLINIC_OR_DEPARTMENT_OTHER): Payer: Self-pay

## 2022-03-11 ENCOUNTER — Other Ambulatory Visit: Payer: Self-pay

## 2022-03-11 MED ORDER — FUROSEMIDE 20 MG PO TABS
ORAL_TABLET | ORAL | 3 refills | Status: DC
  Filled 2022-03-11: qty 30, 30d supply, fill #0
  Filled 2022-04-09: qty 30, 30d supply, fill #1
  Filled 2022-05-06: qty 30, 30d supply, fill #2

## 2022-03-25 DIAGNOSIS — I77819 Aortic ectasia, unspecified site: Secondary | ICD-10-CM

## 2022-03-25 HISTORY — DX: Aortic ectasia, unspecified site: I77.819

## 2022-03-26 ENCOUNTER — Ambulatory Visit (HOSPITAL_BASED_OUTPATIENT_CLINIC_OR_DEPARTMENT_OTHER): Payer: Medicare Other | Admitting: Family Medicine

## 2022-03-27 ENCOUNTER — Ambulatory Visit (HOSPITAL_COMMUNITY): Payer: Medicare Other | Attending: Cardiology

## 2022-03-27 DIAGNOSIS — R0609 Other forms of dyspnea: Secondary | ICD-10-CM | POA: Insufficient documentation

## 2022-03-27 LAB — ECHOCARDIOGRAM COMPLETE
Area-P 1/2: 2.05 cm2
S' Lateral: 2.2 cm

## 2022-04-09 ENCOUNTER — Other Ambulatory Visit (HOSPITAL_BASED_OUTPATIENT_CLINIC_OR_DEPARTMENT_OTHER): Payer: Self-pay

## 2022-04-09 ENCOUNTER — Other Ambulatory Visit: Payer: Self-pay

## 2022-04-10 ENCOUNTER — Ambulatory Visit: Payer: Medicare Other | Admitting: Physician Assistant

## 2022-04-12 ENCOUNTER — Telehealth (HOSPITAL_COMMUNITY): Payer: Self-pay | Admitting: Emergency Medicine

## 2022-04-12 NOTE — Telephone Encounter (Signed)
Reaching out to patient to offer assistance regarding upcoming cardiac imaging study; pt verbalizes understanding of appt date/time, parking situation and where to check in, pre-test NPO status and medications ordered, and verified current allergies; name and call back number provided for further questions should they arise Marchia Bond RN Navigator Cardiac Imaging [facility] [phone] office [phone] cell  Arrival [address] [facility] main entrance Denies iv issues  No food  No caffeine Daily meds

## 2022-04-12 NOTE — Telephone Encounter (Signed)
Attempted to call patient regarding upcoming cardiac PET appointment. Left message on voicemail with name and callback number Muhamad Serano RN Navigator Cardiac Imaging [facility] [phone] Office [phone] Cell  

## 2022-04-13 ENCOUNTER — Other Ambulatory Visit (HOSPITAL_BASED_OUTPATIENT_CLINIC_OR_DEPARTMENT_OTHER): Payer: Self-pay

## 2022-04-16 ENCOUNTER — Encounter (HOSPITAL_COMMUNITY)
Admission: RE | Admit: 2022-04-16 | Discharge: 2022-04-16 | Disposition: A | Discharge status: Home / Self Care | Payer: Medicare Other | Source: Ambulatory Visit | Attending: Physician Assistant | Admitting: Physician Assistant

## 2022-04-16 DIAGNOSIS — Z01810 Encounter for preprocedural cardiovascular examination: Secondary | ICD-10-CM | POA: Diagnosis not present

## 2022-04-16 DIAGNOSIS — R0609 Other forms of dyspnea: Secondary | ICD-10-CM | POA: Diagnosis not present

## 2022-04-16 LAB — NM PET CT CARDIAC PERFUSION MULTI W/ABSOLUTE BLOODFLOW
MBFR: 1.68
Rest MBF: 1.42 ml/g/min
Rest Nuclear Isotope Dose: 19.1 mCi
ST Depression (mm): 0 mm
Stress MBF: 2.39 ml/g/min
Stress Nuclear Isotope Dose: 19.1 mCi

## 2022-04-16 MED ORDER — RUBIDIUM RB82 GENERATOR (RUBYFILL)
19.1000 | PACK | Freq: Once | INTRAVENOUS | Status: AC
  Administered 2022-04-16: 19.1 via INTRAVENOUS

## 2022-04-16 MED ORDER — RUBIDIUM RB82 GENERATOR (RUBYFILL): 19.1000 | PACK | Freq: Once | INTRAVENOUS | Status: DC

## 2022-04-16 MED ORDER — REGADENOSON 0.4 MG/5ML IV SOLN: 0.4000 mg | Freq: Once | INTRAVENOUS | Status: AC

## 2022-04-16 MED ORDER — REGADENOSON 0.4 MG/5ML IV SOLN
INTRAVENOUS | Status: AC
  Administered 2022-04-16: 0.4 mg via INTRAVENOUS
  Filled 2022-04-16: qty 5

## 2022-04-17 ENCOUNTER — Telehealth: Payer: Self-pay | Admitting: Physician Assistant

## 2022-04-17 NOTE — Telephone Encounter (Signed)
   PET scan reviewed with results concerning for ischemia. D/w Dr. Caryl Comes - recommend cardiac cath for definitive evaluation.  I called patient and spoke with her about pursuing cardiac cath. She is in agreement with this plan. However, her last visit was >1 month ago. We will bring her in for next available appointment on Monday for updated H&P and arrangement of cath at that time, will need updated labs as well. I offered her appt in [place] with me that day but she prefers [place] - scheduled with Richardson Dopp PA-C at 10:55, to arrive 10:40am. She is already on baby ASA. No recent chest pain. (Main symptom the last few months was DOE). ED precautions reviewed. She was very appreciative of the call.  Will route to [place] so he is aware.

## 2022-04-22 ENCOUNTER — Encounter: Payer: Self-pay | Admitting: Physician Assistant

## 2022-04-22 ENCOUNTER — Ambulatory Visit: Payer: Medicare Other | Admitting: Physician Assistant

## 2022-04-22 NOTE — H&P (View-Only) (Signed)
Cardiology Office Note:    Date:  04/23/2022   ID:  Chiquita Heckert, DOB Jan 27, 1946, MRN [id number]  PCP:  [place], Blondell Reveal, MD   [place] Providers Cardiologist:  None Electrophysiologist:  Virl Axe, MD     Referring MD: [place], Blondell Reveal, MD   Chief Complaint  Patient presents with   Follow-up    Seen for Dr. Caryl Comes   History of Present Illness:    Brayli Klingbeil is a 77 y.o. female with a hx of CAD (by prior CTA), asymmetric septal hypertrophy (did not meet criteria for HCM by cMRI 2022), atrial tachycardia, palpitations (on verapamil), HTN, aortic atherosclerosis, SVT, GERD, HLD.   Initially established care with our practice in 2022, with concerns of palpitations and atypical chest pain. Recent zio monitor ordered by her PCP showed SVT with HR up to 266 bpm, and she was started on verapamil.   She saw Dr. Caryl Comes last on 01/15/22, at that time she was having some DOE and lasix, and ASA was started.   She presented for pre-op evaluation with Melina Copa PA, on 02/27/22 for upcoming right hip arthroplasty. She was doing ok overall, but still had some DOE that was marginally improved with initiation of lasix at previous OV but still bothersome. Discussed with Dr. Caryl Comes, and decision was made to obtain cardiac PET to clarify the issue of possible ischemia noted on prior nuclear CT. Results were again discussed with Dr. Caryl Comes and the decision was made to proceed with cardiac cath.   She presents today for decision making surrounding possible cardiac catheterization.  Overall, she is feeling well from a cardiac perspective.  She does continue to have some DOE, and this is worrisome to her, it is no worse or no better than when she was previously in our office.  We discussed the risk and benefits of cardiac catheterization, and she would like to proceed for further evaluation. She takes lasix PRN for pedal edema, and she has only needed it 1-2 times/last month, she  does not feel it helped her DOE. She denies chest pain, palpitations, dyspnea, pnd, orthopnea, n, v, dizziness, syncope, edema, weight gain, or early satiety.    Past Medical History:  Diagnosis Date   Anxiety 07/2020   Aortic dilatation ([place]) 03/25/2022   38 mm noted on echo   Arthritis 11/2017   Barrett's esophagus    CAD (coronary artery disease)    Cataract    had surgery   Chronic kidney disease, stage 3a (HCC)    GERD (gastroesophageal reflux disease)    hx of    HIATAL HERNIA    s/p surgical repair   Hyperlipidemia    HYPERTENSION    Kidney stone    1990s   LVH (left ventricular hypertrophy)    Osteoporosis    PAT (paroxysmal atrial tachycardia)    Premature atrial contractions    Wellness examination 08/22/2021    Past Surgical History:  Procedure Laterality Date   ABDOMINAL HYSTERECTOMY  06/12/2012   CATARACT EXTRACTION  2009   both eyes   CHOLECYSTECTOMY  11/02/2012   Procedure: CHOLECYSTECTOMY;  Surgeon: Pedro Earls, MD;  Location: [facility];  Service: General;;   COLOSTOMY     CYSTOCELE REPAIR N/A 06/12/2012   Procedure: ANTERIOR REPAIR (CYSTOCELE);  Surgeon: Elveria Royals, MD;  Location: [facility];  Service: Gynecology;  Laterality: N/A;   EYE SURGERY  Cataract removal 2009   HERNIA REPAIR  2011  HIATAL HERNIA REPAIR  2011   LITHOTRIPSY     TONSILLECTOMY  1968   UPPER GASTROINTESTINAL ENDOSCOPY     UPPER GI ENDOSCOPY  11/02/2012   Procedure: UPPER GI ENDOSCOPY;  Surgeon: Pedro Earls, MD;  Location: [facility];  Service: General;;   VAGINAL HYSTERECTOMY N/A 06/12/2012   Procedure: HYSTERECTOMY VAGINAL;  Surgeon: Elveria Royals, MD;  Location: [facility];  Service: Gynecology;  Laterality: N/A;    Current Medications: Current Meds  Medication Sig   aspirin EC 81 MG tablet Take 1 tablet (81 mg total) by mouth daily.   Cholecalciferol (QC VITAMIN D3) 50 MCG (2000 UT) CAPS Take 1 capsule by mouth daily.   COVID-19 mRNA vaccine 2023-2024 (COMIRNATY) SUSP  injection Inject into the muscle.   Cyanocobalamin (VITAMIN B-12 CR PO) Take 1,200 Units by mouth daily.   furosemide (LASIX) 20 MG tablet Take 1 tablet by mouth daily for 3 days, then take as needed for fluid   methocarbamol (ROBAXIN) 500 MG tablet Take 1-2 tablets (500-1,000 mg total) by mouth every 6 to 8 hours as needed for muscle spasms/tension.   Multiple Vitamin (MULTIVITAMIN WITH MINERALS) TABS Take 1 tablet by mouth daily.   Probiotic Product (PROBIOTIC DAILY PO) Take 1 capsule by mouth daily.   rosuvastatin (CRESTOR) 5 MG tablet Take 1 tablet (5 mg total) by mouth daily.   RSV vaccine recomb adjuvanted (AREXVY) 120 MCG/0.5ML injection Inject into the muscle.   verapamil (CALAN-SR) 180 MG CR tablet Take 1 tablet (180 mg total) by mouth daily.   Zoster Vaccine Adjuvanted [facility]) injection Inject into the muscle.     Allergies:   Augmentin [amoxicillin-pot clavulanate] and Metoprolol   Social History   Socioeconomic History   Marital status: Widowed    Spouse name: Not on file   Number of children: 5   Years of education: 14   Highest education level: Not on file  Occupational History   Occupation: Retired  Tobacco Use   Smoking status: Never    Passive exposure: Past   Smokeless tobacco: Never  Vaping Use   Vaping Use: Never used  Substance and Sexual Activity   Alcohol use: Never   Drug use: Never   Sexual activity: Not Currently    Birth control/protection: Post-menopausal    Comment: Hysterectomy  Other Topics Concern   Not on file  Social History Narrative   widowed since late 2011 (spouse had mod-adv dementia) 5 grown children, 3 nearby-,many g-kids   Social Determinants of Radio broadcast assistant Strain: Not on file  Food Insecurity: Not on file  Transportation Needs: Not on file  Physical Activity: Not on file  Stress: Not on file  Social Connections: Not on file     Family History: The patient's family history includes Asthma in her maternal  grandmother; Breast cancer in her maternal grandmother and another family member; Breast cancer (age of onset: 24) in her mother; Cancer in her maternal grandmother, mother, paternal aunt, and paternal uncle; Colon cancer in her paternal aunt; Early death in her father and paternal uncle; Heart disease in her father and another family member; Hypertension in her mother and another family member; Kidney disease in her father. There is no history of Esophageal cancer, Rectal cancer, Stomach cancer, or Colon polyps.  ROS:   Please see the history of present illness.     All other systems reviewed and are negative.  EKGs/Labs/Other Studies Reviewed:    The following studies were reviewed  today:  04/16/22 PET CT -    LV perfusion is abnormal. There is evidence of ischemia. There is no evidence of infarction. Defect 1: There is a medium defect with moderate reduction in uptake present in the apical to basal inferior and inferoseptal location(s) that is reversible. There is normal wall motion in the defect area. Consistent with ischemia.   Rest left ventricular function is abnormal. End diastolic cavity size is normal. End systolic cavity size is normal.   Myocardial blood flow was computed to be 1.41m/g/min at rest and 2.392mg/min at stress. Global myocardial blood flow reserve was 1.68 and was abnormal.   Coronary calcium was present on the attenuation correction CT images. Severe coronary calcifications were present. Coronary calcifications were present in the left anterior descending artery, left circumflex artery and right coronary artery distribution(s).   Findings are consistent with ischemia. The study is intermediate risk.   Blood flow reserve severely abnormal in RCA distribution 0.96  03/27/22 echo complete - EF 60-65%, no RWMA, mild asymmetric LVH of the basal septal segment, trivial MR, mild calcification of the AV, mild thickening of the AV, aortic dilatation noted (3846m  05/29/21 cardiac  CTA -  No LA appendage thrombus. Pulmonary veins drain normally to the left atrium.   Calcium Score: 929 Agatston units.   Coronary Arteries: Right dominant with no anomalies   LM: Mixed plaque left main, mild stenosis (1-24%).   LAD system: Primarily calcified plaque proximal LAD, mild stenosis (25-49%). Calcified plaque in the mid to distal LAD, these areas are obscured by artifact but there does not appear to be severe stenosis. Large D1 with mixed plaque, mild stenosis (25-49%).   Circumflex system: Small ramus, no significant disease. Mixed plaque in the mid LCx, probably mild stenosis (25-49%), but mid to distal LCx obscured by artifact.   RCA system: Mixed plaque in the proximal RCA, mild stenosis (25-49%). The mid to distal RCA is obscured by artifact. There is calcified plaque present but unable to define degree of stenosis.   IMPRESSION: 1. Coronary artery calcium score 929 Agatston units. This places the patient in the 95th percentile for age and gender, suggesting high risk for future cardiac events.   2. Significant artifact obscuring the mid to distal vessels (RCA, LAD, LCX). Unable to comment on the mid to distal RCA. Probably no severe stenosis in the mid to distal LAD and LCX. With artifact, will not send for FFR.   Overall, no definite severe stenosis, but given the degree of artifact, would consider functional study.  11/02/20 MR cardiac -  IMPRESSION: 1. Asymmetric LV hypertrophy measuring 40m60m basal septum (5mm 68mposterior wall). Does not meet criteria for hypertrophic cardiomyopathy (less than 15mm 72m thickness)   2.  No lata gadolinium enhancement to suggest myocardial scar   3.  Normal LV size and systolic function (EF 55%)  29%  Normal RV size and systolic function (EF 61%)  47%  Large hiatal hernia   08/15/20 Zio monitor -  Normal sinus rhythm with rare PACs, PVCs. Paroxysmal SVT with HR up to 266 bpm, and rhythm lasting up to 2  minutes. No atrial fibrillation.  EKG:  EKG is ordered today.  The ekg ordered today demonstrates NSR, HR 83 bpm, consistent with previous EKG tracings.   Recent Labs: 10/09/2021: ALT 12 02/27/2022: BUN 15; Creatinine, Ser 1.13; Hemoglobin 13.8; Magnesium 2.0; NT-Pro BNP 203; Platelets 266; Potassium 4.6; Sodium 142; TSH 1.050  Recent Lipid Panel  Component Value Date/Time   CHOL 120 10/09/2021 0720   TRIG 92 10/09/2021 0720   HDL 55 10/09/2021 0720   CHOLHDL 2.2 10/09/2021 0720   CHOLHDL 2 05/31/2020 0841   VLDL 19.6 05/31/2020 0841   LDLCALC 47 10/09/2021 0720     Risk Assessment/Calculations:      HYPERTENSION CONTROL Vitals:   04/23/22 1013 04/23/22 1231  BP: (!) 140/82 (!) 140/80    The patient's blood pressure is elevated above target today.  In order to address the patient's elevated BP: The blood pressure is usually elevated in clinic.  Blood pressures monitored at home have been optimal.            Physical Exam:    VS:  BP (!) 140/80   Pulse 86   Ht '5\' 6"'$  (1.676 m)   Wt 163 lb 6.4 oz (74.1 kg)   LMP  (LMP Unknown)   SpO2 99%   BMI 26.37 kg/m     Wt Readings from Last 3 Encounters:  04/23/22 163 lb 6.4 oz (74.1 kg)  03/06/22 162 lb 6.4 oz (73.7 kg)  02/27/22 164 lb (74.4 kg)     GEN:  Well nourished, well developed in no acute distress HEENT: Normal NECK: No JVD; No carotid bruits LYMPHATICS: No lymphadenopathy CARDIAC: RRR, no murmurs, rubs, gallops RESPIRATORY:  Clear to auscultation without rales, wheezing or rhonchi  ABDOMEN: Soft, non-tender, non-distended MUSCULOSKELETAL:  No edema; No deformity  SKIN: Warm and dry NEUROLOGIC:  Alert and oriented x 3 PSYCHIATRIC:  Normal affect   ASSESSMENT:    1. Coronary artery disease involving native coronary artery of native heart without angina pectoris   2. Aortic atherosclerosis ([place])   3. Essential hypertension   4. Stage 3a chronic kidney disease (HCC)   5. Palpitations   6. Atrial  tachycardia   7. Asymmetric septal hypertrophy (HCC)    PLAN:    In order of problems listed above:  CAD/Aortic atherosclerosis/Asymmetrical septal hypertrophy - Recent PET CT showed evidence of ischemia. She does continue to have DOE, which could be an anginal equivalent. Denies chest pain or acute decompensation. Dr. Caryl Comes felt it would be prudent to proceed with a heart cath, discussed recommendations with Ms. Wynonia Lawman and she agrees with recommendations for [facility]. Continue ASA, rosuvastatin. Check CBC, BMET.   HTN - BP today is elevated 140/82, likely some element of white coat HTN and concern for upcoming heart cath.   CKD - GFR on 02/27/22 is 51.  Avoid nephrotoxic agents.  Repeat BMET today.  HLD - LDL 47 on 10/09/21, well controlled, continue Crestor.  Palpitations/Atrial tachycardia- quiescent, continue verapamil.       Shared Decision Making/Informed Consent The risks [stroke (1 in 1000), death (1 in 1000), kidney failure [usually temporary] (1 in 500), bleeding (1 in 200), allergic reaction [possibly serious] (1 in 200)], benefits (diagnostic support and management of coronary artery disease) and alternatives of a cardiac catheterization were discussed in detail with Ms. Saggese and she is willing to proceed.    Medication Adjustments/Labs and Tests Ordered: Current medicines are reviewed at length with the patient today.  Concerns regarding medicines are outlined above.  Orders Placed This Encounter  Procedures   CBC   Basic metabolic panel   EKG 25-KNLZ   No orders of the defined types were placed in this encounter.   Patient Instructions  Medication Instructions:  Your physician recommends that you continue on your current medications as directed. Please refer to the  Current Medication list given to you today.  *If you need a refill on your cardiac medications before your next appointment, please call your pharmacy*   Lab Work: CBC and BMET:Today If you have labs  (blood work) drawn today and your tests are completely normal, you will receive your results only by: [place] (if you have MyChart) OR A paper copy in the mail If you have any lab test that is abnormal or we need to change your treatment, we will call you to review the results.   Testing/Procedures:       Cardiac/Peripheral Catheterization   You are scheduled for a Cardiac Catheterization on Monday, February 5 with Dr. Harrell Gave End.  1. Please arrive at the Main Entrance A at [facility]: [address], [place] [address] on February 5 at 5:30 AM (This time is two hours before your procedure to ensure your preparation). Free valet parking service is available. You will check in at ADMITTING. The support person will be asked to wait in the waiting room.  It is OK to have someone drop you off and come back when you are ready to be discharged.        Special note: Every effort is made to have your procedure done on time. Please understand that emergencies sometimes delay scheduled procedures.   . 2. Diet: Do not eat solid foods after midnight.  You may have clear liquids until 5 AM the day of the procedure.  3. Labs: You will need to have blood drawn today: CBC and BMET  4. Medication instructions in preparation for your procedure:   Contrast Allergy: No  Do not take furosemide (Lasix) morning of procedure   On the morning of your procedure, take Aspirin 81 mg and any morning medicines NOT listed above.  You may use sips of water.  5. Plan to go home the same day, you will only stay overnight if medically necessary. 6. You MUST have a responsible adult to drive you home. 7. An adult MUST be with you the first 24 hours after you arrive home. 8. Bring a current list of your medications, and the last time and date medication taken. 9. Bring ID and current insurance cards. 10.Please wear clothes that are easy to get on and off and wear slip-on  shoes.  Thank you for allowing [place] to care for you!   -- [facility]    Follow-Up: At [facility], you and your health needs are our priority.  As part of our continuing mission to provide you with exceptional heart care, we have created designated Provider Care Teams.  These Care Teams include your primary Cardiologist (physician) and Advanced Practice Providers (APPs -  Physician Assistants and Nurse Practitioners) who all work together to provide you with the care you need, when you need it.   Your next appointment:   3 week(s)  Provider:   Dr Caryl Comes  or Melina Copa, PA-C or Richardson Dopp, PA-C         Signed, Trudi Ida, NP  04/23/2022 12:33 PM    [place]

## 2022-04-22 NOTE — Progress Notes (Signed)
Cardiology Office Note:    Date:  04/23/2022   ID:  Sara [name], DOB 1945/08/23, MRN [id number]  PCP:  [place], Blondell Reveal, MD   [place] Providers Cardiologist:  None Electrophysiologist:  Virl Axe, MD     Referring MD: [place], Blondell Reveal, MD   Chief Complaint  Patient presents with   Follow-up    Seen for Dr. Caryl Comes   History of Present Illness:    Sara [name] is a 77 y.o. female with a hx of CAD (by prior CTA), asymmetric septal hypertrophy (did not meet criteria for HCM by cMRI 2022), atrial tachycardia, palpitations (on verapamil), HTN, aortic atherosclerosis, SVT, GERD, HLD.   Initially established care with our practice in 2022, with concerns of palpitations and atypical chest pain. Recent zio monitor ordered by her PCP showed SVT with HR up to 266 bpm, and she was started on verapamil.   She saw Dr. Caryl Comes last on 01/15/22, at that time she was having some DOE and lasix, and ASA was started.   She presented for pre-op evaluation with Melina Copa PA, on 02/27/22 for upcoming right hip arthroplasty. She was doing ok overall, but still had some DOE that was marginally improved with initiation of lasix at previous OV but still bothersome. Discussed with Dr. Caryl Comes, and decision was made to obtain cardiac PET to clarify the issue of possible ischemia noted on prior nuclear CT. Results were again discussed with Dr. Caryl Comes and the decision was made to proceed with cardiac cath.   She presents today for decision making surrounding possible cardiac catheterization.  Overall, she is feeling well from a cardiac perspective.  She does continue to have some DOE, and this is worrisome to her, it is no worse or no better than when she was previously in our office.  We discussed the risk and benefits of cardiac catheterization, and she would like to proceed for further evaluation. She takes lasix PRN for pedal edema, and she has only needed it 1-2 times/last month, she  does not feel it helped her DOE. She denies chest pain, palpitations, dyspnea, pnd, orthopnea, n, v, dizziness, syncope, edema, weight gain, or early satiety.    Past Medical History:  Diagnosis Date   Anxiety 07/2020   Aortic dilatation ([place]) 03/25/2022   38 mm noted on echo   Arthritis 11/2017   Barrett's esophagus    CAD (coronary artery disease)    Cataract    had surgery   Chronic kidney disease, stage 3a (HCC)    GERD (gastroesophageal reflux disease)    hx of    HIATAL HERNIA    s/p surgical repair   Hyperlipidemia    HYPERTENSION    Kidney stone    1990s   LVH (left ventricular hypertrophy)    Osteoporosis    PAT (paroxysmal atrial tachycardia)    Premature atrial contractions    Wellness examination 08/22/2021    Past Surgical History:  Procedure Laterality Date   ABDOMINAL HYSTERECTOMY  06/12/2012   CATARACT EXTRACTION  2009   both eyes   CHOLECYSTECTOMY  11/02/2012   Procedure: CHOLECYSTECTOMY;  Surgeon: Pedro Earls, MD;  Location: [facility];  Service: General;;   COLOSTOMY     CYSTOCELE REPAIR N/A 06/12/2012   Procedure: ANTERIOR REPAIR (CYSTOCELE);  Surgeon: Elveria Royals, MD;  Location: [facility];  Service: Gynecology;  Laterality: N/A;   EYE SURGERY  Cataract removal 2009   HERNIA REPAIR  2011  HIATAL HERNIA REPAIR  2011   LITHOTRIPSY     TONSILLECTOMY  1968   UPPER GASTROINTESTINAL ENDOSCOPY     UPPER GI ENDOSCOPY  11/02/2012   Procedure: UPPER GI ENDOSCOPY;  Surgeon: Pedro Earls, MD;  Location: [facility];  Service: General;;   VAGINAL HYSTERECTOMY N/A 06/12/2012   Procedure: HYSTERECTOMY VAGINAL;  Surgeon: Elveria Royals, MD;  Location: [facility];  Service: Gynecology;  Laterality: N/A;    Current Medications: Current Meds  Medication Sig   aspirin EC 81 MG tablet Take 1 tablet (81 mg total) by mouth daily.   Cholecalciferol (QC VITAMIN D3) 50 MCG (2000 UT) CAPS Take 1 capsule by mouth daily.   COVID-19 mRNA vaccine 2023-2024 (COMIRNATY) SUSP  injection Inject into the muscle.   Cyanocobalamin (VITAMIN B-12 CR PO) Take 1,200 Units by mouth daily.   furosemide (LASIX) 20 MG tablet Take 1 tablet by mouth daily for 3 days, then take as needed for fluid   methocarbamol (ROBAXIN) 500 MG tablet Take 1-2 tablets (500-1,000 mg total) by mouth every 6 to 8 hours as needed for muscle spasms/tension.   Multiple Vitamin (MULTIVITAMIN WITH MINERALS) TABS Take 1 tablet by mouth daily.   Probiotic Product (PROBIOTIC DAILY PO) Take 1 capsule by mouth daily.   rosuvastatin (CRESTOR) 5 MG tablet Take 1 tablet (5 mg total) by mouth daily.   RSV vaccine recomb adjuvanted (AREXVY) 120 MCG/0.5ML injection Inject into the muscle.   verapamil (CALAN-SR) 180 MG CR tablet Take 1 tablet (180 mg total) by mouth daily.   Zoster Vaccine Adjuvanted [facility]) injection Inject into the muscle.     Allergies:   Augmentin [amoxicillin-pot clavulanate] and Metoprolol   Social History   Socioeconomic History   Marital status: Widowed    Spouse name: Not on file   Number of children: 5   Years of education: 14   Highest education level: Not on file  Occupational History   Occupation: Retired  Tobacco Use   Smoking status: Never    Passive exposure: Past   Smokeless tobacco: Never  Vaping Use   Vaping Use: Never used  Substance and Sexual Activity   Alcohol use: Never   Drug use: Never   Sexual activity: Not Currently    Birth control/protection: Post-menopausal    Comment: Hysterectomy  Other Topics Concern   Not on file  Social History Narrative   widowed since late 2011 (spouse had mod-adv dementia) 5 grown children, 3 nearby-,many g-kids   Social Determinants of Radio broadcast assistant Strain: Not on file  Food Insecurity: Not on file  Transportation Needs: Not on file  Physical Activity: Not on file  Stress: Not on file  Social Connections: Not on file     Family History: The patient's family history includes Asthma in her maternal  grandmother; Breast cancer in her maternal grandmother and another family member; Breast cancer (age of onset: 31) in her mother; Cancer in her maternal grandmother, mother, paternal aunt, and paternal uncle; Colon cancer in her paternal aunt; Early death in her father and paternal uncle; Heart disease in her father and another family member; Hypertension in her mother and another family member; Kidney disease in her father. There is no history of Esophageal cancer, Rectal cancer, Stomach cancer, or Colon polyps.  ROS:   Please see the history of present illness.     All other systems reviewed and are negative.  EKGs/Labs/Other Studies Reviewed:    The following studies were reviewed  today:  04/16/22 PET CT -    LV perfusion is abnormal. There is evidence of ischemia. There is no evidence of infarction. Defect 1: There is a medium defect with moderate reduction in uptake present in the apical to basal inferior and inferoseptal location(s) that is reversible. There is normal wall motion in the defect area. Consistent with ischemia.   Rest left ventricular function is abnormal. End diastolic cavity size is normal. End systolic cavity size is normal.   Myocardial blood flow was computed to be 1.35m/g/min at rest and 2.352mg/min at stress. Global myocardial blood flow reserve was 1.68 and was abnormal.   Coronary calcium was present on the attenuation correction CT images. Severe coronary calcifications were present. Coronary calcifications were present in the left anterior descending artery, left circumflex artery and right coronary artery distribution(s).   Findings are consistent with ischemia. The study is intermediate risk.   Blood flow reserve severely abnormal in RCA distribution 0.96  03/27/22 echo complete - EF 60-65%, no RWMA, mild asymmetric LVH of the basal septal segment, trivial MR, mild calcification of the AV, mild thickening of the AV, aortic dilatation noted (3855m  05/29/21 cardiac  CTA -  No LA appendage thrombus. Pulmonary veins drain normally to the left atrium.   Calcium Score: 929 Agatston units.   Coronary Arteries: Right dominant with no anomalies   LM: Mixed plaque left main, mild stenosis (1-24%).   LAD system: Primarily calcified plaque proximal LAD, mild stenosis (25-49%). Calcified plaque in the mid to distal LAD, these areas are obscured by artifact but there does not appear to be severe stenosis. Large D1 with mixed plaque, mild stenosis (25-49%).   Circumflex system: Small ramus, no significant disease. Mixed plaque in the mid LCx, probably mild stenosis (25-49%), but mid to distal LCx obscured by artifact.   RCA system: Mixed plaque in the proximal RCA, mild stenosis (25-49%). The mid to distal RCA is obscured by artifact. There is calcified plaque present but unable to define degree of stenosis.   IMPRESSION: 1. Coronary artery calcium score 929 Agatston units. This places the patient in the 95th percentile for age and gender, suggesting high risk for future cardiac events.   2. Significant artifact obscuring the mid to distal vessels (RCA, LAD, LCX). Unable to comment on the mid to distal RCA. Probably no severe stenosis in the mid to distal LAD and LCX. With artifact, will not send for FFR.   Overall, no definite severe stenosis, but given the degree of artifact, would consider functional study.  11/02/20 MR cardiac -  IMPRESSION: 1. Asymmetric LV hypertrophy measuring 42m21m basal septum (5mm 73mposterior wall). Does not meet criteria for hypertrophic cardiomyopathy (less than 15mm 100m thickness)   2.  No lata gadolinium enhancement to suggest myocardial scar   3.  Normal LV size and systolic function (EF 55%)  64%  Normal RV size and systolic function (EF 61%)  33%  Large hiatal hernia   08/15/20 Zio monitor -  Normal sinus rhythm with rare PACs, PVCs. Paroxysmal SVT with HR up to 266 bpm, and rhythm lasting up to 2  minutes. No atrial fibrillation.  EKG:  EKG is ordered today.  The ekg ordered today demonstrates NSR, HR 83 bpm, consistent with previous EKG tracings.   Recent Labs: 10/09/2021: ALT 12 02/27/2022: BUN 15; Creatinine, Ser 1.13; Hemoglobin 13.8; Magnesium 2.0; NT-Pro BNP 203; Platelets 266; Potassium 4.6; Sodium 142; TSH 1.050  Recent Lipid Panel  Component Value Date/Time   CHOL 120 10/09/2021 0720   TRIG 92 10/09/2021 0720   HDL 55 10/09/2021 0720   CHOLHDL 2.2 10/09/2021 0720   CHOLHDL 2 05/31/2020 0841   VLDL 19.6 05/31/2020 0841   LDLCALC 47 10/09/2021 0720     Risk Assessment/Calculations:      HYPERTENSION CONTROL Vitals:   04/23/22 1013 04/23/22 1231  BP: (!) 140/82 (!) 140/80    The patient's blood pressure is elevated above target today.  In order to address the patient's elevated BP: The blood pressure is usually elevated in clinic.  Blood pressures monitored at home have been optimal.            Physical Exam:    VS:  BP (!) 140/80   Pulse 86   Ht '5\' 6"'$  (1.676 m)   Wt 163 lb 6.4 oz (74.1 kg)   LMP  (LMP Unknown)   SpO2 99%   BMI 26.37 kg/m     Wt Readings from Last 3 Encounters:  04/23/22 163 lb 6.4 oz (74.1 kg)  03/06/22 162 lb 6.4 oz (73.7 kg)  02/27/22 164 lb (74.4 kg)     GEN:  Well nourished, well developed in no acute distress HEENT: Normal NECK: No JVD; No carotid bruits LYMPHATICS: No lymphadenopathy CARDIAC: RRR, no murmurs, rubs, gallops RESPIRATORY:  Clear to auscultation without rales, wheezing or rhonchi  ABDOMEN: Soft, non-tender, non-distended MUSCULOSKELETAL:  No edema; No deformity  SKIN: Warm and dry NEUROLOGIC:  Alert and oriented x 3 PSYCHIATRIC:  Normal affect   ASSESSMENT:    1. Coronary artery disease involving native coronary artery of native heart without angina pectoris   2. Aortic atherosclerosis ([place])   3. Essential hypertension   4. Stage 3a chronic kidney disease (HCC)   5. Palpitations   6. Atrial  tachycardia   7. Asymmetric septal hypertrophy (HCC)    PLAN:    In order of problems listed above:  CAD/Aortic atherosclerosis/Asymmetrical septal hypertrophy - Recent PET CT showed evidence of ischemia. She does continue to have DOE, which could be an anginal equivalent. Denies chest pain or acute decompensation. Dr. Caryl Comes felt it would be prudent to proceed with a heart cath, discussed recommendations with Sara [name] and she agrees with recommendations for [facility]. Continue ASA, rosuvastatin. Check CBC, BMET.   HTN - BP today is elevated 140/82, likely some element of white coat HTN and concern for upcoming heart cath.   CKD - GFR on 02/27/22 is 51.  Avoid nephrotoxic agents.  Repeat BMET today.  HLD - LDL 47 on 10/09/21, well controlled, continue Crestor.  Palpitations/Atrial tachycardia- quiescent, continue verapamil.       Shared Decision Making/Informed Consent The risks [stroke (1 in 1000), death (1 in 1000), kidney failure [usually temporary] (1 in 500), bleeding (1 in 200), allergic reaction [possibly serious] (1 in 200)], benefits (diagnostic support and management of coronary artery disease) and alternatives of a cardiac catheterization were discussed in detail with Sara [name] and she is willing to proceed.    Medication Adjustments/Labs and Tests Ordered: Current medicines are reviewed at length with the patient today.  Concerns regarding medicines are outlined above.  Orders Placed This Encounter  Procedures   CBC   Basic metabolic panel   EKG 82-NOIB   No orders of the defined types were placed in this encounter.   Patient Instructions  Medication Instructions:  Your physician recommends that you continue on your current medications as directed. Please refer to the  Current Medication list given to you today.  *If you need a refill on your cardiac medications before your next appointment, please call your pharmacy*   Lab Work: CBC and BMET:Today If you have labs  (blood work) drawn today and your tests are completely normal, you will receive your results only by: [place] (if you have MyChart) OR A paper copy in the mail If you have any lab test that is abnormal or we need to change your treatment, we will call you to review the results.   Testing/Procedures:       Cardiac/Peripheral Catheterization   You are scheduled for a Cardiac Catheterization on Monday, February 5 with Dr. Harrell Gave End.  1. Please arrive at the Main Entrance A at [facility]: [place], [place] [address] on February 5 at 5:30 AM (This time is two hours before your procedure to ensure your preparation). Free valet parking service is available. You will check in at ADMITTING. The support person will be asked to wait in the waiting room.  It is OK to have someone drop you off and come back when you are ready to be discharged.        Special note: Every effort is made to have your procedure done on time. Please understand that emergencies sometimes delay scheduled procedures.   . 2. Diet: Do not eat solid foods after midnight.  You may have clear liquids until 5 AM the day of the procedure.  3. Labs: You will need to have blood drawn today: CBC and BMET  4. Medication instructions in preparation for your procedure:   Contrast Allergy: No  Do not take furosemide (Lasix) morning of procedure   On the morning of your procedure, take Aspirin 81 mg and any morning medicines NOT listed above.  You may use sips of water.  5. Plan to go home the same day, you will only stay overnight if medically necessary. 6. You MUST have a responsible adult to drive you home. 7. An adult MUST be with you the first 24 hours after you arrive home. 8. Bring a current list of your medications, and the last time and date medication taken. 9. Bring ID and current insurance cards. 10.Please wear clothes that are easy to get on and off and wear slip-on  shoes.  Thank you for allowing [place] to care for you!   -- [place] [facility]    Follow-Up: At [facility], you and your health needs are our priority.  As part of our continuing mission to provide you with exceptional heart care, we have created designated Provider Care Teams.  These Care Teams include your primary Cardiologist (physician) and Advanced Practice Providers (APPs -  Physician Assistants and Nurse Practitioners) who all work together to provide you with the care you need, when you need it.   Your next appointment:   3 week(s)  Provider:   Dr Caryl Comes  or Melina Copa, PA-C or Richardson Dopp, PA-C         Signed, Trudi Ida, NP  04/23/2022 12:33 PM    [place]

## 2022-04-23 ENCOUNTER — Encounter: Payer: Self-pay | Admitting: Physician Assistant

## 2022-04-23 ENCOUNTER — Ambulatory Visit: Payer: Medicare Other | Attending: Physician Assistant | Admitting: Cardiology

## 2022-04-23 DIAGNOSIS — I4719 Other supraventricular tachycardia: Secondary | ICD-10-CM | POA: Diagnosis not present

## 2022-04-23 DIAGNOSIS — R002 Palpitations: Secondary | ICD-10-CM | POA: Diagnosis not present

## 2022-04-23 DIAGNOSIS — I700 Atherosclerosis of aorta: Secondary | ICD-10-CM

## 2022-04-23 DIAGNOSIS — I10 Essential (primary) hypertension: Secondary | ICD-10-CM | POA: Diagnosis not present

## 2022-04-23 DIAGNOSIS — I2510 Atherosclerotic heart disease of native coronary artery without angina pectoris: Secondary | ICD-10-CM

## 2022-04-23 DIAGNOSIS — I422 Other hypertrophic cardiomyopathy: Secondary | ICD-10-CM

## 2022-04-23 DIAGNOSIS — I517 Cardiomegaly: Secondary | ICD-10-CM

## 2022-04-23 DIAGNOSIS — N1831 Chronic kidney disease, stage 3a: Secondary | ICD-10-CM

## 2022-04-23 LAB — CBC
Hematocrit: 43 % (ref 34.0–46.6)
Hemoglobin: 14.7 g/dL (ref 11.1–15.9)
MCH: 31 pg (ref 26.6–33.0)
MCHC: 34.2 g/dL (ref 31.5–35.7)
MCV: 91 fL (ref 79–97)
Platelets: 266 x10E3/uL (ref 150–450)
RBC: 4.74 x10E6/uL (ref 3.77–5.28)
RDW: 14 % (ref 11.7–15.4)
WBC: 7 x10E3/uL (ref 3.4–10.8)

## 2022-04-23 LAB — BASIC METABOLIC PANEL WITH GFR
BUN/Creatinine Ratio: 17 (ref 12–28)
BUN: 19 mg/dL (ref 8–27)
CO2: 29 mmol/L (ref 20–29)
Calcium: 10.2 mg/dL (ref 8.7–10.3)
Chloride: 102 mmol/L (ref 96–106)
Creatinine, Ser: 1.12 mg/dL — ABNORMAL HIGH (ref 0.57–1.00)
Glucose: 100 mg/dL — ABNORMAL HIGH (ref 70–99)
Potassium: 4.6 mmol/L (ref 3.5–5.2)
Sodium: 140 mmol/L (ref 134–144)
eGFR: 52 mL/min/1.73 — ABNORMAL LOW

## 2022-04-23 NOTE — Patient Instructions (Addendum)
Medication Instructions:  Your physician recommends that you continue on your current medications as directed. Please refer to the Current Medication list given to you today.  *If you need a refill on your cardiac medications before your next appointment, please call your pharmacy*   Lab Work: CBC and BMET:Today If you have labs (blood work) drawn today and your tests are completely normal, you will receive your results only by: [place] (if you have MyChart) OR A paper copy in the mail If you have any lab test that is abnormal or we need to change your treatment, we will call you to review the results.   Testing/Procedures:       Cardiac/Peripheral Catheterization   You are scheduled for a Cardiac Catheterization on Monday, February 5 with Dr. Harrell Gave End.  1. Please arrive at the Main Entrance A at [facility]: [place], [place] [address] on February 5 at 5:30 AM (This time is two hours before your procedure to ensure your preparation). Free valet parking service is available. You will check in at ADMITTING. The support person will be asked to wait in the waiting room.  It is OK to have someone drop you off and come back when you are ready to be discharged.        Special note: Every effort is made to have your procedure done on time. Please understand that emergencies sometimes delay scheduled procedures.   . 2. Diet: Do not eat solid foods after midnight.  You may have clear liquids until 5 AM the day of the procedure.  3. Labs: You will need to have blood drawn today: CBC and BMET  4. Medication instructions in preparation for your procedure:   Contrast Allergy: No  Do not take furosemide (Lasix) morning of procedure   On the morning of your procedure, take Aspirin 81 mg and any morning medicines NOT listed above.  You may use sips of water.  5. Plan to go home the same day, you will only stay overnight if medically necessary. 6. You  MUST have a responsible adult to drive you home. 7. An adult MUST be with you the first 24 hours after you arrive home. 8. Bring a current list of your medications, and the last time and date medication taken. 9. Bring ID and current insurance cards. 10.Please wear clothes that are easy to get on and off and wear slip-on shoes.  Thank you for allowing [place] to care for you!   -- [place] [facility]    Follow-Up: At [facility], you and your health needs are our priority.  As part of our continuing mission to provide you with exceptional heart care, we have created designated Provider Care Teams.  These Care Teams include your primary Cardiologist (physician) and Advanced Practice Providers (APPs -  Physician Assistants and Nurse Practitioners) who all work together to provide you with the care you need, when you need it.   Your next appointment:   3 week(s)  Provider:   Dr Caryl Comes  or Melina Copa, PA-C or Richardson Dopp, PA-C

## 2022-04-25 ENCOUNTER — Telehealth: Payer: Self-pay | Admitting: *Deleted

## 2022-04-25 NOTE — Telephone Encounter (Addendum)
Cardiac Catheterization scheduled at [facility] for: Monday April 29, 2022 7:30 AM Arrival time and place: [place] Entrance A at 5:30 AM  Nothing to eat after midnight prior to procedure, clear liquids until 5 AM day of procedure.  Medication instructions: -Hold:  Lasix-pt reports she takes prn -Except hold medications usual morning medications can be taken with sips of water including aspirin 81 mg.  Confirmed patient has responsible adult to drive home post procedure and be with patient first 24 hours after arriving home.  Patient reports no new symptoms concerning for COVID-19 in the past 10 days.  Reviewed procedure instructions with patient.

## 2022-04-26 ENCOUNTER — Ambulatory Visit: Payer: Medicare Other | Admitting: Nurse Practitioner

## 2022-04-29 ENCOUNTER — Other Ambulatory Visit (HOSPITAL_COMMUNITY): Payer: Self-pay

## 2022-04-29 ENCOUNTER — Other Ambulatory Visit: Payer: Self-pay

## 2022-04-29 ENCOUNTER — Ambulatory Visit (HOSPITAL_COMMUNITY)
Admission: RE | Disposition: A | Discharge status: Home / Self Care | Payer: Self-pay | Source: Home / Self Care | Attending: Internal Medicine

## 2022-04-29 ENCOUNTER — Ambulatory Visit (HOSPITAL_COMMUNITY)
Admission: RE | Admit: 2022-04-29 | Discharge: 2022-04-29 | Disposition: A | Discharge status: Home / Self Care | Payer: Medicare Other | Attending: Internal Medicine | Admitting: Internal Medicine

## 2022-04-29 ENCOUNTER — Encounter (HOSPITAL_COMMUNITY): Payer: Self-pay | Admitting: Internal Medicine

## 2022-04-29 DIAGNOSIS — R0609 Other forms of dyspnea: Secondary | ICD-10-CM | POA: Diagnosis not present

## 2022-04-29 DIAGNOSIS — I700 Atherosclerosis of aorta: Secondary | ICD-10-CM | POA: Diagnosis not present

## 2022-04-29 DIAGNOSIS — I517 Cardiomegaly: Secondary | ICD-10-CM | POA: Insufficient documentation

## 2022-04-29 DIAGNOSIS — I129 Hypertensive chronic kidney disease with stage 1 through stage 4 chronic kidney disease, or unspecified chronic kidney disease: Secondary | ICD-10-CM | POA: Insufficient documentation

## 2022-04-29 DIAGNOSIS — I471 Supraventricular tachycardia, unspecified: Secondary | ICD-10-CM | POA: Diagnosis not present

## 2022-04-29 DIAGNOSIS — N1831 Chronic kidney disease, stage 3a: Secondary | ICD-10-CM | POA: Diagnosis not present

## 2022-04-29 DIAGNOSIS — Z79899 Other long term (current) drug therapy: Secondary | ICD-10-CM | POA: Diagnosis not present

## 2022-04-29 DIAGNOSIS — I2510 Atherosclerotic heart disease of native coronary artery without angina pectoris: Secondary | ICD-10-CM | POA: Insufficient documentation

## 2022-04-29 DIAGNOSIS — K219 Gastro-esophageal reflux disease without esophagitis: Secondary | ICD-10-CM | POA: Diagnosis not present

## 2022-04-29 DIAGNOSIS — Z7982 Long term (current) use of aspirin: Secondary | ICD-10-CM | POA: Diagnosis not present

## 2022-04-29 DIAGNOSIS — R9439 Abnormal result of other cardiovascular function study: Secondary | ICD-10-CM | POA: Diagnosis not present

## 2022-04-29 DIAGNOSIS — E785 Hyperlipidemia, unspecified: Secondary | ICD-10-CM | POA: Diagnosis not present

## 2022-04-29 DIAGNOSIS — I10 Essential (primary) hypertension: Secondary | ICD-10-CM | POA: Diagnosis present

## 2022-04-29 DIAGNOSIS — Z955 Presence of coronary angioplasty implant and graft: Secondary | ICD-10-CM

## 2022-04-29 HISTORY — PX: RIGHT/LEFT HEART CATH AND CORONARY ANGIOGRAPHY: CATH118266

## 2022-04-29 HISTORY — PX: CORONARY STENT INTERVENTION: CATH118234

## 2022-04-29 HISTORY — PX: CORONARY ULTRASOUND/IVUS: CATH118244

## 2022-04-29 LAB — POCT I-STAT EG7
Acid-base deficit: 1 mmol/L (ref 0.0–2.0)
Acid-base deficit: 2 mmol/L (ref 0.0–2.0)
Acid-base deficit: 2 mmol/L (ref 0.0–2.0)
Bicarbonate: 24 mmol/L (ref 20.0–28.0)
Bicarbonate: 24.2 mmol/L (ref 20.0–28.0)
Bicarbonate: 24.6 mmol/L (ref 20.0–28.0)
Calcium, Ion: 1.19 mmol/L (ref 1.15–1.40)
Calcium, Ion: 1.31 mmol/L (ref 1.15–1.40)
Calcium, Ion: 1.31 mmol/L (ref 1.15–1.40)
HCT: 36 % (ref 36.0–46.0)
HCT: 37 % (ref 36.0–46.0)
HCT: 38 % (ref 36.0–46.0)
Hemoglobin: 12.2 g/dL (ref 12.0–15.0)
Hemoglobin: 12.6 g/dL (ref 12.0–15.0)
Hemoglobin: 12.9 g/dL (ref 12.0–15.0)
O2 Saturation: 80 %
O2 Saturation: 80 %
O2 Saturation: 82 %
Potassium: 3.6 mmol/L (ref 3.5–5.1)
Potassium: 3.8 mmol/L (ref 3.5–5.1)
Potassium: 3.8 mmol/L (ref 3.5–5.1)
Sodium: 141 mmol/L (ref 135–145)
Sodium: 141 mmol/L (ref 135–145)
Sodium: 144 mmol/L (ref 135–145)
TCO2: 25 mmol/L (ref 22–32)
TCO2: 25 mmol/L (ref 22–32)
TCO2: 26 mmol/L (ref 22–32)
pCO2, Ven: 42.9 mmHg — ABNORMAL LOW (ref 44–60)
pCO2, Ven: 43.7 mmHg — ABNORMAL LOW (ref 44–60)
pCO2, Ven: 44.1 mmHg (ref 44–60)
pH, Ven: 7.35 (ref 7.25–7.43)
pH, Ven: 7.355 (ref 7.25–7.43)
pH, Ven: 7.356 (ref 7.25–7.43)
pO2, Ven: 47 mmHg — ABNORMAL HIGH (ref 32–45)
pO2, Ven: 47 mmHg — ABNORMAL HIGH (ref 32–45)
pO2, Ven: 48 mmHg — ABNORMAL HIGH (ref 32–45)

## 2022-04-29 LAB — POCT I-STAT 7, (LYTES, BLD GAS, ICA,H+H)
Acid-base deficit: 2 mmol/L (ref 0.0–2.0)
Bicarbonate: 23 mmol/L (ref 20.0–28.0)
Calcium, Ion: 1.25 mmol/L (ref 1.15–1.40)
HCT: 37 % (ref 36.0–46.0)
Hemoglobin: 12.6 g/dL (ref 12.0–15.0)
O2 Saturation: 99 %
Potassium: 3.8 mmol/L (ref 3.5–5.1)
Sodium: 141 mmol/L (ref 135–145)
TCO2: 24 mmol/L (ref 22–32)
pCO2 arterial: 40.1 mmHg (ref 32–48)
pH, Arterial: 7.366 (ref 7.35–7.45)
pO2, Arterial: 133 mmHg — ABNORMAL HIGH (ref 83–108)

## 2022-04-29 LAB — POCT ACTIVATED CLOTTING TIME
Activated Clotting Time: 287 seconds
Activated Clotting Time: 293 seconds
Activated Clotting Time: 331 seconds

## 2022-04-29 SURGERY — RIGHT/LEFT HEART CATH AND CORONARY ANGIOGRAPHY
Anesthesia: LOCAL

## 2022-04-29 MED ORDER — CLOPIDOGREL BISULFATE 75 MG PO TABS
75.0000 mg | ORAL_TABLET | Freq: Every day | ORAL | 11 refills | Status: DC
  Filled 2022-05-22: qty 30, 30d supply, fill #0
  Filled 2022-06-21: qty 30, 30d supply, fill #1
  Filled 2022-07-22: qty 30, 30d supply, fill #2
  Filled 2022-08-17 (×2): qty 30, 30d supply, fill #3
  Filled 2022-09-16: qty 30, 30d supply, fill #4
  Filled 2022-10-16: qty 30, 30d supply, fill #5
  Filled 2022-11-11: qty 30, 30d supply, fill #6
  Filled 2022-12-10: qty 30, 30d supply, fill #7
  Filled 2023-01-09: qty 30, 30d supply, fill #8
  Filled 2023-02-02: qty 30, 30d supply, fill #9
  Filled 2023-03-04: qty 30, 30d supply, fill #10
  Filled 2023-04-03: qty 30, 30d supply, fill #11

## 2022-04-29 MED ORDER — HEPARIN SODIUM (PORCINE) 1000 UNIT/ML IJ SOLN
INTRAMUSCULAR | Status: DC | PRN
  Administered 2022-04-29: 2000 [IU] via INTRAVENOUS
  Administered 2022-04-29: 4000 [IU] via INTRAVENOUS
  Administered 2022-04-29: 3500 [IU] via INTRAVENOUS

## 2022-04-29 MED ORDER — HYDRALAZINE HCL 20 MG/ML IJ SOLN: 10.0000 mg | INTRAMUSCULAR | Status: DC | PRN

## 2022-04-29 MED ORDER — ASPIRIN 81 MG PO CHEW: 81.0000 mg | CHEWABLE_TABLET | ORAL | Status: DC

## 2022-04-29 MED ORDER — SODIUM CHLORIDE 0.9 % WEIGHT BASED INFUSION
3.0000 mL/kg/h | INTRAVENOUS | Status: AC
  Administered 2022-04-29: 3 mL/kg/h via INTRAVENOUS

## 2022-04-29 MED ORDER — SODIUM CHLORIDE 0.9% FLUSH: 3.0000 mL | INTRAVENOUS | Status: DC | PRN

## 2022-04-29 MED ORDER — IOHEXOL 350 MG/ML SOLN
INTRAVENOUS | Status: DC | PRN
  Administered 2022-04-29: 90 mL

## 2022-04-29 MED ORDER — VERAPAMIL HCL 2.5 MG/ML IV SOLN
INTRAVENOUS | Status: DC | PRN
  Administered 2022-04-29: 10 mL via INTRA_ARTERIAL

## 2022-04-29 MED ORDER — FENTANYL CITRATE (PF) 100 MCG/2ML IJ SOLN
INTRAMUSCULAR | Status: AC
  Filled 2022-04-29: qty 2

## 2022-04-29 MED ORDER — SODIUM CHLORIDE 0.9 % IV SOLN: 250.0000 mL | INTRAVENOUS | Status: DC | PRN

## 2022-04-29 MED ORDER — ONDANSETRON HCL 4 MG/2ML IJ SOLN: 4.0000 mg | Freq: Four times a day (QID) | INTRAMUSCULAR | Status: DC | PRN

## 2022-04-29 MED ORDER — LIDOCAINE HCL (PF) 1 % IJ SOLN
INTRAMUSCULAR | Status: DC | PRN
  Administered 2022-04-29 (×2): 2 mL

## 2022-04-29 MED ORDER — VERAPAMIL HCL 2.5 MG/ML IV SOLN
INTRAVENOUS | Status: AC
  Filled 2022-04-29: qty 2

## 2022-04-29 MED ORDER — SODIUM CHLORIDE 0.9 % IV SOLN: INTRAVENOUS | Status: DC

## 2022-04-29 MED ORDER — NITROGLYCERIN 1 MG/10 ML FOR IR/CATH LAB
INTRA_ARTERIAL | Status: DC | PRN
  Administered 2022-04-29 (×2): 100 ug via INTRACORONARY

## 2022-04-29 MED ORDER — FAMOTIDINE IN NACL 20-0.9 MG/50ML-% IV SOLN
INTRAVENOUS | Status: AC
  Filled 2022-04-29: qty 50

## 2022-04-29 MED ORDER — HEPARIN (PORCINE) IN NACL 1000-0.9 UT/500ML-% IV SOLN
INTRAVENOUS | Status: AC
  Filled 2022-04-29: qty 1000

## 2022-04-29 MED ORDER — LIDOCAINE HCL (PF) 1 % IJ SOLN
INTRAMUSCULAR | Status: AC
  Filled 2022-04-29: qty 30

## 2022-04-29 MED ORDER — SODIUM CHLORIDE 0.9 % IV SOLN
INTRAVENOUS | Status: DC | PRN
  Administered 2022-04-29: 250 mL via INTRAVENOUS

## 2022-04-29 MED ORDER — MIDAZOLAM HCL 2 MG/2ML IJ SOLN
INTRAMUSCULAR | Status: DC | PRN
  Administered 2022-04-29: 1 mg via INTRAVENOUS

## 2022-04-29 MED ORDER — SODIUM CHLORIDE 0.9% FLUSH: 3.0000 mL | Freq: Two times a day (BID) | INTRAVENOUS | Status: DC

## 2022-04-29 MED ORDER — ACETAMINOPHEN 325 MG PO TABS: 650.0000 mg | ORAL_TABLET | ORAL | Status: DC | PRN

## 2022-04-29 MED ORDER — CLOPIDOGREL BISULFATE 75 MG PO TABS: 75.0000 mg | ORAL_TABLET | Freq: Every day | ORAL | Status: DC

## 2022-04-29 MED ORDER — NITROGLYCERIN 1 MG/10 ML FOR IR/CATH LAB
INTRA_ARTERIAL | Status: AC
  Filled 2022-04-29: qty 10

## 2022-04-29 MED ORDER — CLOPIDOGREL BISULFATE 300 MG PO TABS
ORAL_TABLET | ORAL | Status: DC | PRN
  Administered 2022-04-29: 600 mg via ORAL

## 2022-04-29 MED ORDER — ASPIRIN 81 MG PO CHEW: 81.0000 mg | CHEWABLE_TABLET | Freq: Every day | ORAL | Status: DC

## 2022-04-29 MED ORDER — CLOPIDOGREL BISULFATE 300 MG PO TABS
ORAL_TABLET | ORAL | Status: AC
  Filled 2022-04-29: qty 2

## 2022-04-29 MED ORDER — MIDAZOLAM HCL 2 MG/2ML IJ SOLN
INTRAMUSCULAR | Status: AC
  Filled 2022-04-29: qty 2

## 2022-04-29 MED ORDER — HEPARIN (PORCINE) IN NACL 1000-0.9 UT/500ML-% IV SOLN
INTRAVENOUS | Status: DC | PRN
  Administered 2022-04-29 (×3): 500 mL

## 2022-04-29 MED ORDER — FENTANYL CITRATE (PF) 100 MCG/2ML IJ SOLN
INTRAMUSCULAR | Status: DC | PRN
  Administered 2022-04-29 (×2): 25 ug via INTRAVENOUS

## 2022-04-29 MED ORDER — CLOPIDOGREL BISULFATE 75 MG PO TABS
75.0000 mg | ORAL_TABLET | Freq: Every day | ORAL | 0 refills | Status: DC
  Filled 2022-04-29: qty 30, 30d supply, fill #0

## 2022-04-29 MED ORDER — SODIUM CHLORIDE 0.9 % WEIGHT BASED INFUSION
1.0000 mL/kg/h | INTRAVENOUS | Status: DC
  Administered 2022-04-29: 1 mL/kg/h via INTRAVENOUS

## 2022-04-29 MED ORDER — HEPARIN SODIUM (PORCINE) 1000 UNIT/ML IJ SOLN
INTRAMUSCULAR | Status: AC
  Filled 2022-04-29: qty 10

## 2022-04-29 SURGICAL SUPPLY — 23 items
BALLN EMERGE MR 2.0X12 (BALLOONS) ×1
BALLN ~~LOC~~ EMERGE MR 3.25X15 (BALLOONS) ×1
BALLOON EMERGE MR 2.0X12 (BALLOONS) IMPLANT
BALLOON ~~LOC~~ EMERGE MR 3.25X15 (BALLOONS) IMPLANT
CATH 5FR JL3.5 JR4 ANG PIG MP (CATHETERS) IMPLANT
CATH BALLN WEDGE 5F 110CM (CATHETERS) IMPLANT
CATH OPTICROSS HD (CATHETERS) IMPLANT
CATH VISTA GUIDE 6FR JR4 (CATHETERS) IMPLANT
DEVICE RAD COMP TR BAND LRG (VASCULAR PRODUCTS) IMPLANT
GLIDESHEATH SLEND SS 6F .021 (SHEATH) IMPLANT
GUIDEWIRE INQWIRE 1.5J.035X260 (WIRE) IMPLANT
INQWIRE 1.5J .035X260CM (WIRE) ×1
KIT ENCORE 26 ADVANTAGE (KITS) IMPLANT
KIT HEART LEFT (KITS) ×1 IMPLANT
PACK CARDIAC CATHETERIZATION (CUSTOM PROCEDURE TRAY) ×1 IMPLANT
SHEATH GLIDE SLENDER 4/5FR (SHEATH) IMPLANT
SLED PULL BACK IVUS (MISCELLANEOUS) IMPLANT
STENT SYNERGY XD 3.0X32 (Permanent Stent) IMPLANT
TRANSDUCER W/STOPCOCK (MISCELLANEOUS) ×1 IMPLANT
TUBING CIL FLEX 10 FLL-RA (TUBING) ×1 IMPLANT
WIRE HI TORQ VERSACORE-J 145CM (WIRE) IMPLANT
WIRE MICROINTRODUCER 60CM (WIRE) IMPLANT
WIRE RUNTHROUGH .014X180CM (WIRE) IMPLANT

## 2022-04-29 NOTE — Discharge Summary (Signed)
Discharge Summary    Patient ID: Sara [name] MRN: [id number]; DOB: 1945/07/09  Admit date: 04/29/2022 Discharge date: 04/29/2022  PCP:  de [place], Raymond J, MD   [place] Providers Cardiologist:  None  Electrophysiologist:  Virl Axe, MD       Discharge Diagnoses    Principal Problem:   Abnormal stress test Active Problems:   Essential hypertension   Dyspnea on exertion    Diagnostic Studies/Procedures    Cath 04/29/2022 Conclusions: Severe single-vessel coronary artery disease with 99% proximal/mid RCA lesion with TIMI-1 flow and left-to-right collaterals. Mild, non-obstructive coronary artery disease involving the LMCA, LAD, and LCx, as detailed below. Normal left and right-heart filling pressures. Normal Fick cardiac output/index. Successful IVUS-guided PCI to proximal/mid RCA using Synergy 3.0 x 32 mm drug-eluting stent (post-dilated to 3.3 mm) with 0% residual stenosis and TIMI-3 flow.   Recommendations: Dual antiplatelet therapy with aspirin and clopidogrel for at least 3 months, ideally 6 months. Aggressive secondary prevention of coronary artery disease. Anticipate same-day discharge if no post-catheterization complications occur during 6 hours of monitoring.   Diagnostic Dominance: Right  Intervention   _____________   History of Present Illness     Sara [name] is a 77 y.o. female with a hx of CAD (by prior CTA), asymmetric septal hypertrophy (did not meet criteria for HCM by cMRI 2022), atrial tachycardia, palpitations (on verapamil), HTN, aortic atherosclerosis, SVT, GERD, HLD.    Initially established care with our practice in 2022, with concerns of palpitations and atypical chest pain. Recent zio monitor ordered by her PCP showed SVT with HR up to 266 bpm, and she was started on verapamil.    She saw Dr. Caryl Comes last on 01/15/22, at that time she was having some DOE and lasix, and ASA was started.    She presented for  pre-op evaluation with Melina Copa PA, on 02/27/22 for upcoming right hip arthroplasty. She was doing ok overall, but still had some DOE that was marginally improved with initiation of lasix at previous OV but still bothersome. Discussed with Dr. Caryl Comes, and decision was made to obtain cardiac PET to clarify the issue of possible ischemia noted on prior nuclear CT. Results were again discussed with Dr. Caryl Comes and the decision was made to proceed with cardiac cath.    She presents today for decision making surrounding possible cardiac catheterization.  Overall, she is feeling well from a cardiac perspective.  She does continue to have some DOE, and this is worrisome to her, it is no worse or no better than when she was previously in our office.  We discussed the risk and benefits of cardiac catheterization, and she would like to proceed for further evaluation. She takes lasix PRN for pedal edema, and she has only needed it 1-2 times/last month, she does not feel it helped her DOE. She denies chest pain, palpitations, dyspnea, pnd, orthopnea, n, v, dizziness, syncope, edema, weight gain, or early satiety.    Hospital Course     Consultants: N/A   Patient underwent cardiac catheterization on 04/29/2022 which revealed severe single-vessel CAD with 99% proximal to mid RCA, mild nonobstructive CAD involving left main, LAD and left circumflex artery, normal left and right heart filling pressure.  Proximal RCA was treated with Synergy 3.0 x 32 mm DES postdilated to 3.3 mm.  It was recommended patient will need to continue aspirin and Plavix for minimum of 3 months but ideally 6 months postprocedure.  She is deemed a  candidate for same-day discharge.  Right radial cath site was checked around 3 PM, there was small amount of bleeding which has since stopped.  She also has mild bruising as well.  Emphasis has been placed on compliance with dual antiplatelet therapy.  Follow-up has been arranged.      Did the patient  have an acute coronary syndrome (MI, NSTEMI, STEMI, etc) this admission?:  No                               Did the patient have a percutaneous coronary intervention (stent / angioplasty)?:  Yes.     Cath/PCI Registry Performance & Quality Measures: Aspirin prescribed? - Yes ADP Receptor Inhibitor (Plavix/Clopidogrel, Brilinta/Ticagrelor or Effient/Prasugrel) prescribed (includes medically managed patients)? - Yes High Intensity Statin (Lipitor 40-'80mg'$  or Crestor 20-'40mg'$ ) prescribed? - Yes For EF <40%, was ACEI/ARB prescribed? - Not Applicable (EF >/= 62%) For EF <40%, Aldosterone Antagonist (Spironolactone or Eplerenone) prescribed? - Not Applicable (EF >/= 22%) Cardiac Rehab Phase II ordered? - Yes         _____________  Discharge Vitals Blood pressure 116/75, pulse 68, temperature (!) 97.5 F (36.4 C), temperature source Oral, resp. rate (!) 21, height '5\' 7"'$  (1.702 m), weight 73.5 kg, SpO2 97 %.  Filed Weights   04/29/22 0551  Weight: 73.5 kg    Labs & Radiologic Studies    CBC Recent Labs    04/29/22 0800 04/29/22 0801  HGB 12.6 12.2  HCT 37.0 97.9   Basic Metabolic Panel Recent Labs    04/29/22 0800 04/29/22 0801  NA 141 144  K 3.8 3.6   Liver Function Tests No results for input(s): "AST", "ALT", "ALKPHOS", "BILITOT", "PROT", "ALBUMIN" in the last 72 hours. No results for input(s): "LIPASE", "AMYLASE" in the last 72 hours. High Sensitivity Troponin:   No results for input(s): "TROPONINIHS" in the last 720 hours.  BNP Invalid input(s): "POCBNP" D-Dimer No results for input(s): "DDIMER" in the last 72 hours. Hemoglobin A1C No results for input(s): "HGBA1C" in the last 72 hours. Fasting Lipid Panel No results for input(s): "CHOL", "HDL", "LDLCALC", "TRIG", "CHOLHDL", "LDLDIRECT" in the last 72 hours. Thyroid Function Tests No results for input(s): "TSH", "T4TOTAL", "T3FREE", "THYROIDAB" in the last 72 hours.  Invalid input(s): "FREET3" _____________   CARDIAC CATHETERIZATION  Result Date: 04/29/2022 Conclusions: Severe single-vessel coronary artery disease with 99% proximal/mid RCA lesion with TIMI-1 flow and left-to-right collaterals. Mild, non-obstructive coronary artery disease involving the LMCA, LAD, and LCx, as detailed below. Normal left and right-heart filling pressures. Normal Fick cardiac output/index. Successful IVUS-guided PCI to proximal/mid RCA using Synergy 3.0 x 32 mm drug-eluting stent (post-dilated to 3.3 mm) with 0% residual stenosis and TIMI-3 flow. Recommendations: Dual antiplatelet therapy with aspirin and clopidogrel for at least 3 months, ideally 6 months. Aggressive secondary prevention of coronary artery disease. Anticipate same-day discharge if no post-catheterization complications occur during 6 hours of monitoring. Nelva Bush, MD [facility]  NM PET CT CARDIAC PERFUSION MULTI W/ABSOLUTE BLOODFLOW  Result Date: 04/16/2022   LV perfusion is abnormal. There is evidence of ischemia. There is no evidence of infarction. Defect 1: There is a medium defect with moderate reduction in uptake present in the apical to basal inferior and inferoseptal location(s) that is reversible. There is normal wall motion in the defect area. Consistent with ischemia.   Rest left ventricular function is abnormal. End diastolic cavity size is normal. End systolic cavity  size is normal.   Myocardial blood flow was computed to be 1.45m/g/min at rest and 2.311mg/min at stress. Global myocardial blood flow reserve was 1.68 and was abnormal.   Coronary calcium was present on the attenuation correction CT images. Severe coronary calcifications were present. Coronary calcifications were present in the left anterior descending artery, left circumflex artery and right coronary artery distribution(s).   Findings are consistent with ischemia. The study is intermediate risk.   Blood flow reserve severely abnormal in RCA distribution 0.96 CLINICAL DATA:   This over-read does not include interpretation of cardiac or coronary anatomy or pathology. The cardiac PET-CT interpretation by the cardiologist is attached. COMPARISON:  None Available. FINDINGS: Aortic atherosclerosis. Within the visualized portions of the thorax there are no suspicious appearing pulmonary nodules or masses, there is no acute consolidative airspace disease, no pleural effusions, no pneumothorax and no lymphadenopathy. Moderate-sized hiatal hernia. Surgical clips near the level of the esophageal hiatus. Visualized portions of the upper abdomen are unremarkable. There are no aggressive appearing lytic or blastic lesions noted in the visualized portions of the skeleton. IMPRESSION: 1.  Aortic Atherosclerosis (ICD10-I70.0). 2. Moderate hiatal hernia. Electronically Signed   By: DaVinnie Langton.D.   On: 04/16/2022 08:47  Disposition   Pt is being discharged home today in good condition.  Follow-up Plans & Appointments     Follow-up Information     DuCharlie PitterPA-C Follow up on 05/13/2022.   Specialties: Cardiology, Radiology Why: 10:05AM. Cardiology follow up Contact information: [id number]              Discharge Instructions     Amb Referral to Cardiac Rehabilitation   Complete by: As directed    Diagnosis: Coronary Stents   After initial evaluation and assessments completed: Virtual Based Care may be provided alone or in conjunction with Phase 2 Cardiac Rehab based on patient barriers.: Yes   Int[facility] Cardiac [facility] ([facility]CR) [place] only OR Traditional Cardiac Rehabilitation (TCR) *If criteria for ICR are not met will enroll in [facility] ([facility] Yes        Discharge Medications   Allergies as of 04/29/2022       Reactions   Augmentin [amoxicillin-pot Clavulanate] Diarrhea   Metoprolol    Had prior to cor CT, felt very dizzy   Nsaids    Avoid due to diminished kidney function         Medication List     TAKE these medications    Arexvy 120 MCG/0.5ML injection Generic drug: RSV vaccine recomb adjuvanted Inject into the muscle.   Aspirin Adult Low Strength 81 MG tablet Generic drug: aspirin EC Take 1 tablet (81 mg total) by mouth daily.   clopidogrel 75 MG tablet Commonly known as: Plavix Take 1 tablet (75 mg total) by mouth daily.   clopidogrel 75 MG tablet Commonly known as: Plavix Take 1 tablet (75 mg total) by mouth daily.   COVID-19 mRNA vaccine 2023-2024 Susp injection Commonly known as: COMIRNATY Inject into the muscle.   cyanocobalamin 1000 MCG tablet Commonly known as: VITAMIN B12 Take 1,000 mcg by mouth daily.   furosemide 20 MG tablet Commonly known as: LASIX Take 1 tablet by mouth daily for 3 days, then take as needed for fluid What changed:  how much to take how to take this when to take this reasons to take this additional instructions   methocarbamol 500 MG tablet Commonly known as:  ROBAXIN Take 1-2 tablets (500-1,000 mg total) by mouth every 6 to 8 hours as needed for muscle spasms/tension.   multivitamin with minerals Tabs tablet Take 1 tablet by mouth daily.   PROBIOTIC DAILY PO Take 1 capsule by mouth daily.   QC Vitamin D3 50 MCG (2000 UT) Caps Generic drug: Cholecalciferol Take 1 capsule by mouth daily.   rosuvastatin 5 MG tablet Commonly known as: CRESTOR Take 1 tablet (5 mg total) by mouth daily.   Shingrix injection Generic drug: Zoster Vaccine Adjuvanted Inject into the muscle.   verapamil 180 MG CR tablet Commonly known as: CALAN-SR Take 1 tablet (180 mg total) by mouth daily.           Outstanding Labs/Studies   N/A  Duration of Discharge Encounter   Greater than 30 minutes including physician time.  Hilbert Corrigan, PA 04/29/2022, 3:03 PM

## 2022-04-29 NOTE — Interval H&P Note (Signed)
History and Physical Interval Note:  04/29/2022 7:27 AM  Sara [name]  has presented today for surgery, with the diagnosis of shortness of breath and abnormal stress test.  The various methods of treatment have been discussed with the patient and family. After consideration of risks, benefits and other options for treatment, the patient has consented to  Procedure(s): RIGHT/LEFT HEART CATH AND CORONARY ANGIOGRAPHY (N/A) as a surgical intervention.  The patient's history has been reviewed, patient examined, no change in status, stable for surgery.  I have reviewed the patient's chart and labs.  Questions were answered to the patient's satisfaction.    Cath Lab Visit (complete for each Cath Lab visit)  Clinical Evaluation Leading to the Procedure:   ACS: No.  Non-ACS:    Anginal Classification: CCS III  Anti-ischemic medical therapy: Minimal Therapy (1 class of medications)  Non-Invasive Test Results: Intermediate-risk stress test findings: cardiac mortality 1-3%/year  Prior CABG: No previous CABG   Sara [name]

## 2022-04-29 NOTE — Progress Notes (Addendum)
Pt educated on exercise guidelines, nutrition, site care, importance of antiplatelet medicine, nitroglycerine and CRP II. Pt understood and had no questions. Pt referred to CRP II in [place].  [place], BSRT

## 2022-04-29 NOTE — Discharge Instructions (Signed)
No lifting over 5 lbs for 1 week. No sexual activity for 1 week. Keep procedure site clean & dry. If you notice increased pain, swelling, bleeding or pus, call/return!  You may shower, but no soaking baths/hot tubs/pools for 1 week °

## 2022-05-01 ENCOUNTER — Ambulatory Visit (HOSPITAL_BASED_OUTPATIENT_CLINIC_OR_DEPARTMENT_OTHER): Payer: Medicare Other | Admitting: Family Medicine

## 2022-05-01 MED FILL — Famotidine in NaCl 0.9% IV Soln 20 MG/50ML: INTRAVENOUS | Qty: 50 | Status: AC

## 2022-05-06 ENCOUNTER — Other Ambulatory Visit: Payer: Self-pay

## 2022-05-07 ENCOUNTER — Other Ambulatory Visit (HOSPITAL_BASED_OUTPATIENT_CLINIC_OR_DEPARTMENT_OTHER): Payer: Self-pay

## 2022-05-07 ENCOUNTER — Other Ambulatory Visit (HOSPITAL_COMMUNITY): Payer: Self-pay

## 2022-05-07 ENCOUNTER — Ambulatory Visit (HOSPITAL_BASED_OUTPATIENT_CLINIC_OR_DEPARTMENT_OTHER): Payer: Medicare Other | Admitting: Family Medicine

## 2022-05-09 ENCOUNTER — Telehealth (HOSPITAL_COMMUNITY): Payer: Self-pay

## 2022-05-09 NOTE — Telephone Encounter (Signed)
Pt insurance is active and benefits verified through [facility] Medicare. Co-pay $0.00, DED $0.00/$0.00 met, out of pocket $3,600.00/$158.28 met, co-insurance 0%. No pre-authorization required. Passport, 05/09/2022 @ 10:56AM, [id number]   How many CR sessions are covered? (36 sessions for TCR, 72 sessions for ICR)72 Is this a lifetime maximum or an annual maximum? Lifetime Has the member used any of these services to date? No Is there a time limit (weeks/months) on start of program and/or program completion? No     Will contact patient to see if she is interested in the Cardiac Rehab Program. If interested, patient will need to complete follow up appt. Once completed, patient will be contacted for scheduling upon review by the RN Navigator.

## 2022-05-09 NOTE — Telephone Encounter (Signed)
Attempted to call patient in regards to Cardiac Rehab - LM on VM 

## 2022-05-12 NOTE — Progress Notes (Unsigned)
Cardiology Clinic Note   Patient Name: Ertha Cristea Date of Encounter: 05/13/2022  Primary Care Provider:  [place], Blondell Reveal, MD Primary Cardiologist:  None (followed by Dr. Caryl Comes for EP)  Patient Profile    Sara [name] is a 77 y.o. female with a past medical history of CAD s/p PCI with DES to proximal/mid RCA, SVT/atrial ectopy, LVH (not meeting criteria for HCM), hypertension, hyperlipidemia, CKD 3a by labs who presents to the clinic today for hospital follow-up post cardiac catheterization.  Past Medical History    Past Medical History:  Diagnosis Date   Anxiety 07/2020   Aortic dilatation ([place]) 03/25/2022   38 mm noted on echo   Arthritis 11/2017   Barrett's esophagus    CAD (coronary artery disease)    Cataract    had surgery   Chronic kidney disease, stage 3a (HCC)    GERD (gastroesophageal reflux disease)    hx of    HIATAL HERNIA    s/p surgical repair   Hyperlipidemia    HYPERTENSION    Kidney stone    1990s   LVH (left ventricular hypertrophy)    Osteoporosis    PAT (paroxysmal atrial tachycardia)    Premature atrial contractions    Wellness examination 08/22/2021   Past Surgical History:  Procedure Laterality Date   ABDOMINAL HYSTERECTOMY  06/12/2012   CATARACT EXTRACTION  2009   both eyes   CHOLECYSTECTOMY  11/02/2012   Procedure: CHOLECYSTECTOMY;  Surgeon: Pedro Earls, MD;  Location: [facility];  Service: General;;   COLOSTOMY     CORONARY STENT INTERVENTION N/A 04/29/2022   Procedure: CORONARY STENT INTERVENTION;  Surgeon: Nelva Bush, MD;  Location: [facility];  Service: Cardiovascular;  Laterality: N/A;   CYSTOCELE REPAIR N/A 06/12/2012   Procedure: ANTERIOR REPAIR (CYSTOCELE);  Surgeon: Elveria Royals, MD;  Location: [facility];  Service: Gynecology;  Laterality: N/A;   EYE SURGERY  Cataract removal 2009   HERNIA REPAIR  2011   HIATAL HERNIA REPAIR  2011   INTRAVASCULAR ULTRASOUND/IVUS N/A 04/29/2022   Procedure:  Intravascular Ultrasound/IVUS;  Surgeon: Nelva Bush, MD;  Location: [facility];  Service: Cardiovascular;  Laterality: N/A;   LITHOTRIPSY     RIGHT/LEFT HEART CATH AND CORONARY ANGIOGRAPHY N/A 04/29/2022   Procedure: RIGHT/LEFT HEART CATH AND CORONARY ANGIOGRAPHY;  Surgeon: Nelva Bush, MD;  Location: [facility];  Service: Cardiovascular;  Laterality: N/A;   TONSILLECTOMY  1968   UPPER GASTROINTESTINAL ENDOSCOPY     UPPER GI ENDOSCOPY  11/02/2012   Procedure: UPPER GI ENDOSCOPY;  Surgeon: Pedro Earls, MD;  Location: [facility];  Service: General;;   VAGINAL HYSTERECTOMY N/A 06/12/2012   Procedure: HYSTERECTOMY VAGINAL;  Surgeon: Elveria Royals, MD;  Location: [facility];  Service: Gynecology;  Laterality: N/A;    Allergies  Allergies  Allergen Reactions   Augmentin [Amoxicillin-Pot Clavulanate] Diarrhea   Metoprolol     Had prior to cor CT, felt very dizzy   Nsaids     Avoid due to diminished kidney function    History of Present Illness    Domnique Raysor has a past medical history of: CAD. Coronary CT 05/29/2021: Calcium score 929 (95th percentile).  Significant artifact obscuring mid to distal vessels.  Likely no severe stenosis in mid to distal LAD and LCx.  Not sent for FFR. Echo 03/27/2022: EF 60 to 65%.  Mild asymmetric LVH basal-septal segment.  Trivial MR.  Aortic valve sclerosis/calcification  present without stenosis.  Borderline dilatation of ascending aorta 38 mm. Cardiac PET/CT 04/16/2022: LV perfusion is abnormal with evidence of ischemia.  Reversible medium defect with moderate reduction in uptake apical to basal inferior and inferoseptal.  Severe coronary calcifications present LAD, LCx, and RCA.  Severely abnormal blood flow reserve RCA distribution. R/LHC 04/29/2022: Nonobstructive CAD LM, LAD, LCx.  Proximal/mid RCA 99%.  PCI with DES 3.0 x 32 mm to proximal/mid RCA.  Normal left and right heart filling pressures, normal Fick cardiac  output/index. Palpitations/SVT/atrial ectopy. Cardiac event monitor 08/15/2020: Predominant underlying rhythm was sinus rhythm.  417 supraventricular tachycardia runs fastest lasting 1 minute 51 seconds max rate 266 bpm, longest 2 minutes 1 second with average rate 216 bpm.  Rare PACs and PVCs.  No atrial fibrillation.  Patient was symptomatic during SVT episodes. Asymmetric septal hypertrophy/aortic dilatation. Echo 10/10/2020: EF 55 to 60%.  Moderate asymmetric LVH of the basal-septal segment.  Trivial MR.  Mild aortic valve sclerosis without stenosis.  Mild dilatation of ascending aorta 38 mm. Cardiac MRI 10/31/2020: Asymmetric LVH measuring 12 mm and basal septum, does not meet criteria for HCM.  No suggestion of myocardial scar.  LV/RV size and function normal.  Large hiatal hernia. Repeat echo 03/27/2022 outlined above (see CAD). Hypertension. Hyperlipidemia. Lipid panel 10/09/2021: LDL 47, HDL 55, TG 92, total 120. CKD stage IIIa.  Ms. Nissen was first evaluated by Dr. Caryl Comes on 09/18/2020 for palpitations and chest discomfort at the request of Dr. Rogers Blocker.  Patient wore a cardiac event monitor showing SVT (as outlined above).  Patient was started on verapamil.  Her echo showed moderate asymmetric LVH of the basal-septal segment. Nuclear stress test showed prior inferior infarct and minimal peri-infarct ischemia.  Cardiac MRI to evaluate for HCM showed asymmetric LVH did not meet criteria for HCM.  (testing outlined above).  At patient's office visit with Dr. Caryl Comes on 01/15/2022 there was a question of HFpEF given complaints of DOE with associated edema.  She was started on low-dose diuretics.  Patient was evaluated in the office by Melina Copa, PA-C on 02/27/2022 for preoperative cardiac risk assessment pending hip arthroplasty.  At that time she was complaining of a 74-monthhistory of DOE that was slightly better with the addition of Lasix.  Labs were normal.  Echo was unchanged from 2022.  She underwent  cardiac PET/CT at the suggestion of Dr. KCaryl Comeswhich showed evidence of ischemia (as outlined above).  [facility] received PCI with DES to proximal/mid RCA (outlined above).  Today, patient is doing well. She reports her back pain has improved since her catheterization. She still finds she becomes dyspneic with longer distances but feels this is slowly improving. She states walking into the office did not cause shortness of breath and this was an issue previously.  She is hypertensive today in the office. BP 162/84 at intake and 138/70 at recheck. She checks her BP at home with an upper arm cuff and it normally runs around 120/60s. She denies headaches, dizziness, lightheadedness or vision changes. No chest pain, pressure, or tightness. Denies lower extremity edema, orthopnea, or PND. No palpitations. She has prn lasix, as she was previously having lower extremity edema but she has not needed it recently. She feels she is managing her hip pain well and is okay at this point to put her surgery off for 6 months.     Home Medications    Current Meds  Medication Sig   aspirin EC 81  MG tablet Take 1 tablet (81 mg total) by mouth daily.   Cholecalciferol (QC VITAMIN D3) 50 MCG (2000 UT) CAPS Take 1 capsule by mouth daily.   clopidogrel (PLAVIX) 75 MG tablet Take 1 tablet (75 mg total) by mouth daily.   clopidogrel (PLAVIX) 75 MG tablet Take 1 tablet (75 mg total) by mouth daily.   COVID-19 mRNA vaccine 2023-2024 (COMIRNATY) SUSP injection Inject into the muscle.   cyanocobalamin (VITAMIN B12) 1000 MCG tablet Take 1,000 mcg by mouth daily.   furosemide (LASIX) 20 MG tablet Take 1 tablet by mouth daily for 3 days, then take as needed for fluid (Patient taking differently: Take 20 mg by mouth daily as needed for edema.)   methocarbamol (ROBAXIN) 500 MG tablet Take 1-2 tablets (500-1,000 mg total) by mouth every 6 to 8 hours as needed for muscle spasms/tension.   Multiple Vitamin  (MULTIVITAMIN WITH MINERALS) TABS Take 1 tablet by mouth daily.   Probiotic Product (PROBIOTIC DAILY PO) Take 1 capsule by mouth daily.   rosuvastatin (CRESTOR) 5 MG tablet Take 1 tablet (5 mg total) by mouth daily.   RSV vaccine recomb adjuvanted (AREXVY) 120 MCG/0.5ML injection Inject into the muscle.   verapamil (CALAN-SR) 180 MG CR tablet Take 1 tablet (180 mg total) by mouth daily.   Zoster Vaccine Adjuvanted [facility]) injection Inject into the muscle.    Family History    Family History  Problem Relation Age of Onset   Breast cancer Mother 25       with mets    Cancer Mother    Hypertension Mother    Early death Father    Heart disease Father    Kidney disease Father    Hypertension Other        Parent   Breast cancer Other    Heart disease Other        parent, grandparent   Colon cancer Paternal Aunt    Breast cancer Maternal Grandmother    Asthma Maternal Grandmother    Cancer Maternal Grandmother    Cancer Paternal Uncle    Early death Paternal Uncle    Cancer Paternal Aunt    Esophageal cancer Neg Hx    Rectal cancer Neg Hx    Stomach cancer Neg Hx    Colon polyps Neg Hx    She indicated that her mother is deceased. She indicated that her father is deceased. She indicated that the status of her maternal grandmother is unknown. She indicated that the status of her paternal uncle is unknown. She indicated that the status of her neg hx is unknown. She indicated that the status of her other is unknown.   Social History    Social History   Socioeconomic History   Marital status: Widowed    Spouse name: Not on file   Number of children: 5   Years of education: 14   Highest education level: Not on file  Occupational History   Occupation: Retired  Tobacco Use   Smoking status: Never    Passive exposure: Past   Smokeless tobacco: Never  Vaping Use   Vaping Use: Never used  Substance and Sexual Activity   Alcohol use: Never   Drug use: Never   Sexual  activity: Not Currently    Birth control/protection: Post-menopausal    Comment: Hysterectomy  Other Topics Concern   Not on file  Social History Narrative   widowed since late 2011 (spouse had mod-adv dementia) 5 grown children, 3 nearby-,many g-kids  Social Determinants of Health   Financial Resource Strain: Not on file  Food Insecurity: Not on file  Transportation Needs: Not on file  Physical Activity: Not on file  Stress: Not on file  Social Connections: Not on file  Intimate Partner Violence: Not on file     Review of Systems    General:  No chills, fever, night sweats or weight changes.  Cardiovascular:  No chest pain, dyspnea on exertion, edema, orthopnea, palpitations, paroxysmal nocturnal dyspnea. Dermatological: No rash, lesions/masses Respiratory: No cough, dyspnea Urologic: No hematuria, dysuria Abdominal:   No nausea, vomiting, diarrhea, bright red blood per rectum, melena, or hematemesis Neurologic:  No visual changes, weakness, changes in mental status. All other systems reviewed and are otherwise negative except as noted above.  Physical Exam    VS:  BP (!) 162/84   Pulse 97   Ht 5' 6"$  (1.676 m)   Wt 162 lb 12.8 oz (73.8 kg)   LMP  (LMP Unknown)   SpO2 98%   BMI 26.28 kg/m  , BMI Body mass index is 26.28 kg/m. GEN: Well nourished, well developed, in no acute distress. HEENT: Normal. Neck: Supple, no JVD, carotid bruits, or masses. Cardiac: RRR, no murmurs, rubs, or gallops. No clubbing, cyanosis, edema.  Radials/DP/PT 2+ and equal bilaterally.  Respiratory:  Respirations regular and unlabored, clear to auscultation bilaterally. GI: Soft, nontender, nondistended. MS: No deformity or atrophy. Skin: Warm and dry, no rash. Neuro: Strength and sensation are intact. Psych: Normal affect.  Accessory Clinical Findings    Recent Labs: 10/09/2021: ALT 12 02/27/2022: Magnesium 2.0; NT-Pro BNP 203; TSH 1.050 04/23/2022: BUN 19; Creatinine, Ser 1.12;  Platelets 266 04/29/2022: Hemoglobin 12.2; Potassium 3.6; Sodium 144   Recent Lipid Panel    Component Value Date/Time   CHOL 120 10/09/2021 0720   TRIG 92 10/09/2021 0720   HDL 55 10/09/2021 0720   CHOLHDL 2.2 10/09/2021 0720   CHOLHDL 2 05/31/2020 0841   VLDL 19.6 05/31/2020 0841   LDLCALC 47 10/09/2021 0720       ECG personally reviewed by me today: NSR, rate 76 bpm, nonspecific T wave abnormalities.  No significant changes from 04/29/2022.    Assessment & Plan   CAD.  S/p PCI with DES to proximal/mid RCA February 2024.  Patient denies chest pain, tightness or pressure. She feels dyspnea on exertion is improving and finds she is short of breath only with walking longer distances. She is an avid baseball fan and attends games. She requests a handicap parking sticker for those occasions and I think that is reasonable. Provided with application for the DMV. She is interested in attending cardiac rehab. Continue rosuvastatin, aspirin and Plavix. Will give prn SL NTG. Will check a BMP today. Given prior normal BNP and normal RHC findings I think dyspnea was less likely heart failure. The patient has an active order for outpatient cardiac rehabilitation.   Please indicate if the patient is ready to start. Do NOT delete this.  It will auto delete.  Refresh note, then sign.              Click here to document readiness and see contraindications.  :1}  Cardiac Rehabilitation Eligibility Assessment  The patient is ready to start cardiac rehabilitation from a cardiac standpoint. Palpitations/SVT/atrial ectopy.  Cardiac event monitor May 2022 showed symptomatic runs of SVT.  Patient reports she never experiences palpitations but did have some chest discomfort with runs of SVT. This has resolved since she started on  medication.  Continue verapamil. Asymmetric septal hypertrophy/aortic dilatation.  Echo January 2024 showed mild asymmetric LVH basal-septal segment.  Borderline dilatation of ascending  aorta 38 mm.  Will continue to monitor clinically. Consider repeat echo 03/2023, can be arranged closer to that time in follow-up. Hypertension.  BP today 162/84 on intake and 138/70 on recheck. She reports daily BP at home typically 120/60s. She will call the office if she finds BP is >130/80 consistently.   Patient denies vision changes, headaches or dizziness.  Continue verapamil. Hyperlipidemia.  LDL July 2023 47, at goal.  Discussed increasing rosuvastatin to a higher intensity. In the past she has not been able to tolerate higher doses secondary to myalgias so she has been on this dose for years. As she is well controlled and LDL is at goal we will not make any changes. Continue rosuvastatin at current dose.  Preoperative evaluation. Her original visit was prompted by need for hip surgery, though complicated by dyspnea, abnormal stress test, and resultant cath/PCI as above. Therefore we discussed needing to defer surgery due to new stent and need to continue uninterrupted DAPT. The interventionalist recommended minimum 3 months, ideally at least 6 months. The patient feels she will be able to manage waiting the 6 month duration. If the hip pain progresses and surgery needs to be entertained earlier, would arrange sooner f/u with primary cardiologist to discuss earlier cessation but for now will plan 6 month f/u to discuss. Will route copy of this note to requesting surgeon.   Disposition: BMP today. Prn SL NTG. DMV handicap tag application provided. Return in 6 months or sooner as needed. We'll reach out to Dr. Caryl Comes to inquire whether he feels she needs to establish with a general cardiologist.   Justice [place]. Faysal Fenoglio, DNP, NP-C     05/13/2022, 10:08 AM [address] Office [phone] Fax [phone]

## 2022-05-13 ENCOUNTER — Encounter: Payer: Self-pay | Admitting: Student

## 2022-05-13 ENCOUNTER — Ambulatory Visit: Payer: Medicare Other | Attending: Physician Assistant | Admitting: Student

## 2022-05-13 ENCOUNTER — Other Ambulatory Visit (HOSPITAL_BASED_OUTPATIENT_CLINIC_OR_DEPARTMENT_OTHER): Payer: Self-pay

## 2022-05-13 VITALS — BP 138/70 | HR 97 | Ht 66.0 in | Wt 162.8 lb

## 2022-05-13 DIAGNOSIS — E785 Hyperlipidemia, unspecified: Secondary | ICD-10-CM

## 2022-05-13 DIAGNOSIS — I2510 Atherosclerotic heart disease of native coronary artery without angina pectoris: Secondary | ICD-10-CM | POA: Diagnosis not present

## 2022-05-13 DIAGNOSIS — I77819 Aortic ectasia, unspecified site: Secondary | ICD-10-CM | POA: Diagnosis not present

## 2022-05-13 DIAGNOSIS — I422 Other hypertrophic cardiomyopathy: Secondary | ICD-10-CM

## 2022-05-13 DIAGNOSIS — I10 Essential (primary) hypertension: Secondary | ICD-10-CM

## 2022-05-13 DIAGNOSIS — Z955 Presence of coronary angioplasty implant and graft: Secondary | ICD-10-CM | POA: Diagnosis not present

## 2022-05-13 DIAGNOSIS — Z01810 Encounter for preprocedural cardiovascular examination: Secondary | ICD-10-CM | POA: Diagnosis not present

## 2022-05-13 LAB — BASIC METABOLIC PANEL
BUN/Creatinine Ratio: 14 (ref 12–28)
BUN: 14 mg/dL (ref 8–27)
CO2: 23 mmol/L (ref 20–29)
Calcium: 9.3 mg/dL (ref 8.7–10.3)
Chloride: 106 mmol/L (ref 96–106)
Creatinine, Ser: 1.01 mg/dL — ABNORMAL HIGH (ref 0.57–1.00)
Glucose: 93 mg/dL (ref 70–99)
Potassium: 4.3 mmol/L (ref 3.5–5.2)
Sodium: 141 mmol/L (ref 134–144)
eGFR: 58 mL/min/{1.73_m2} — ABNORMAL LOW (ref >59)

## 2022-05-13 MED ORDER — NITROGLYCERIN 0.4 MG SL SUBL
0.4000 mg | SUBLINGUAL_TABLET | SUBLINGUAL | 3 refills | Status: DC | PRN
  Filled 2022-05-13: qty 25, 30d supply, fill #0

## 2022-05-13 NOTE — Patient Instructions (Addendum)
Medication Instructions:  Your physician has recommended you make the following change in your medication:   START Nitroglycerin 0.4 s.l tablets... The proper use and anticipated side effects of nitroglycerine has been carefully explained.  If a single episode of chest pain is not relieved by one tablet, the patient will try another within 5 minutes; and if this doesn't relieve the pain, the patient is instructed to call 911 for transportation to an emergency department.   *If you need a refill on your cardiac medications before your next appointment, please call your pharmacy*   Lab Work: TODAY:  BMET  If you have labs (blood work) drawn today and your tests are completely normal, you will receive your results only by: [place] (if you have MyChart) OR A paper copy in the mail If you have any lab test that is abnormal or we need to change your treatment, we will call you to review the results.   Testing/Procedures: None ordered   Follow-Up: At [facility], you and your health needs are our priority.  As part of our continuing mission to provide you with exceptional heart care, we have created designated Provider Care Teams.  These Care Teams include your primary Cardiologist (physician) and Advanced Practice Providers (APPs -  Physician Assistants and Nurse Practitioners) who all work together to provide you with the care you need, when you need it.  We recommend signing up for the patient portal called "MyChart".  Sign up information is provided on this After Visit Summary.  MyChart is used to connect with patients for Virtual Visits (Telemedicine).  Patients are able to view lab/test results, encounter notes, upcoming appointments, etc.  Non-urgent messages can be sent to your provider as well.   To learn more about what you can do with MyChart, go to [email or web address].    Your next appointment:   6 month(s)   Provider:   Melina Copa, PA-C         Other  Instructions

## 2022-05-14 ENCOUNTER — Other Ambulatory Visit (HOSPITAL_BASED_OUTPATIENT_CLINIC_OR_DEPARTMENT_OTHER): Payer: Self-pay

## 2022-05-21 ENCOUNTER — Ambulatory Visit (INDEPENDENT_AMBULATORY_CARE_PROVIDER_SITE_OTHER): Payer: Medicare Other | Admitting: Family Medicine

## 2022-05-21 ENCOUNTER — Other Ambulatory Visit (HOSPITAL_BASED_OUTPATIENT_CLINIC_OR_DEPARTMENT_OTHER): Payer: Self-pay

## 2022-05-21 ENCOUNTER — Encounter (HOSPITAL_BASED_OUTPATIENT_CLINIC_OR_DEPARTMENT_OTHER): Payer: Self-pay | Admitting: Family Medicine

## 2022-05-21 VITALS — BP 145/86 | HR 85 | Ht 66.0 in | Wt 160.6 lb

## 2022-05-21 DIAGNOSIS — J0100 Acute maxillary sinusitis, unspecified: Secondary | ICD-10-CM | POA: Insufficient documentation

## 2022-05-21 DIAGNOSIS — I10 Essential (primary) hypertension: Secondary | ICD-10-CM

## 2022-05-21 MED ORDER — AZITHROMYCIN 250 MG PO TABS
ORAL_TABLET | ORAL | 0 refills | Status: DC
  Filled 2022-05-21: qty 6, 5d supply, fill #0

## 2022-05-21 NOTE — Assessment & Plan Note (Signed)
Reports having a head cold starting last Wednesday, continues to have congestion worse in the morning, with tightness under her eyes and nose.  Symptoms have been present greater than 1 week.  Denies fever, chills, currently in no pain.  Has tried Tylenol and cold and flu which helped with the original symptoms.  Uses saline for her nose.  Symptoms are consistent with sinusitis.  Will treat with azithromycin 500 mg today followed by 250 mg x 4 days.  Recommend sinus rinses and supportive therapy.  Follow-up as needed with PCP.

## 2022-05-21 NOTE — Patient Instructions (Signed)
You were prescribed an antibiotic today. Please complete the full course.  Acetaminophen or ibuprofen for fever according to package instructions, do not exceed recommended doses.  Adequate fluids to avoid dehydration. Lots of rest while you are recovering.  Follow-up if your symptoms do not improve.    Sinus rinses.  Humidifying the air may help with nasal dryness.

## 2022-05-21 NOTE — Assessment & Plan Note (Signed)
Blood pressure elevated in office today.  Not improved upon recheck.  Patient reports that she has noticed her blood pressure has been elevated recently she is planning to contact cardiology to inform of this information.  She will continue to monitor at home awaiting cardiology input.  She denies chest pain, vision changes, lower extremity edema, and headaches.  She endorses shortness of breath which has improved since she had a stent placed.  There was no obvious shortness of breath in office today, she is able to converse with provider without difficulty.  Oxygen saturation 100%.

## 2022-05-21 NOTE — Progress Notes (Signed)
Established Patient Office Visit  Subjective   Patient ID: Sara [name], female    DOB: 29-Jan-1946  Age: 77 y.o. MRN: [id number]  Chief Complaint  Patient presents with   Sinusitis    Pt here for having a possible sinus infection, pt stated symptoms started one week ago, she stated she has a lot of head pressure also blood come out when she blow her nose , covid test came back negative     HPI Presents today for an acute visit with complaint of cold that started last Wednesday, "tightness" under eyes and nose. Congestion worse in morning.  Symptoms have been present  one week Associated symptoms include: sinus pain worse at  night Pertinent negatives: no fever or chills Pain severity: 0/10  Treatments tried include : tylenol and cold and flu Treatment effective : helped with cold Sick contacts : n/a   Intermittent pain under left rib cage that comes and goes, recent stent placed per cardiology. Shortness of breath has improved since stent placed.   Blood pressure elevated in office. Taking  medication as prescribed.     Review of Systems  Constitutional:  Negative for chills, fever and malaise/fatigue.  HENT:  Positive for congestion and sinus pain. Negative for nosebleeds (blood when blowing nose) and sore throat.   Eyes:  Negative for blurred vision and double vision.  Respiratory:  Positive for shortness of breath (improving since getting stent placed).   Cardiovascular:  Negative for chest pain and leg swelling.  Gastrointestinal:  Negative for abdominal pain, nausea and vomiting.  Neurological:  Negative for dizziness and headaches.  Psychiatric/Behavioral:  Negative for depression and suicidal ideas.       Objective:     BP (!) 145/86 (BP Location: Right Arm, Patient Position: Sitting, Cuff Size: Large)   Pulse 85   Ht '5\' 6"'$  (1.676 m)   Wt 160 lb 9.6 oz (72.8 kg)   LMP  (LMP Unknown)   SpO2 100%   BMI 25.92 kg/m    Physical Exam Vitals and nursing  note reviewed.  Constitutional:      General: She is not in acute distress.    Appearance: Normal appearance.  HENT:     Right Ear: Tympanic membrane normal.     Left Ear: Tympanic membrane normal.     Nose:     Right Turbinates: Swollen.     Left Turbinates: Swollen.     Right Sinus: Maxillary sinus tenderness present. No frontal sinus tenderness.     Left Sinus: Maxillary sinus tenderness present. No frontal sinus tenderness.     Mouth/Throat:     Pharynx: Uvula midline. No pharyngeal swelling, oropharyngeal exudate, posterior oropharyngeal erythema or uvula swelling.  Cardiovascular:     Rate and Rhythm: Regular rhythm.     Heart sounds: Normal heart sounds.  Pulmonary:     Effort: Pulmonary effort is normal.     Breath sounds: Normal breath sounds.  Abdominal:     General: Bowel sounds are normal.     Palpations: Abdomen is soft.     Tenderness: There is no abdominal tenderness.  Skin:    General: Skin is warm and dry.  Neurological:     General: No focal deficit present.     Mental Status: She is alert. Mental status is at baseline.  Psychiatric:        Mood and Affect: Mood normal.        Behavior: Behavior normal.  Thought Content: Thought content normal.        Judgment: Judgment normal.      No results found for any visits on 05/21/22.    The ASCVD Risk score (Arnett DK, et al., 2019) failed to calculate for the following reasons:   The valid total cholesterol range is 130 to 320 mg/dL    Assessment & Plan:   Problem List Items Addressed This Visit     Elevated blood pressure reading in office with diagnosis of hypertension    Blood pressure elevated in office today.  Not improved upon recheck.  Patient reports that she has noticed her blood pressure has been elevated recently she is planning to contact cardiology to inform of this information.  She will continue to monitor at home awaiting cardiology input.  She denies chest pain, vision changes,  lower extremity edema, and headaches.  She endorses shortness of breath which has improved since she had a stent placed.  There was no obvious shortness of breath in office today, she is able to converse with provider without difficulty.  Oxygen saturation 100%.      Acute non-recurrent maxillary sinusitis - Primary    Reports having a head cold starting last Wednesday, continues to have congestion worse in the morning, with tightness under her eyes and nose.  Symptoms have been present greater than 1 week.  Denies fever, chills, currently in no pain.  Has tried Tylenol and cold and flu which helped with the original symptoms.  Uses saline for her nose.  Symptoms are consistent with sinusitis.  Will treat with azithromycin 500 mg today followed by 250 mg x 4 days.  Recommend sinus rinses and supportive therapy.  Follow-up as needed with PCP.      Relevant Medications   azithromycin (ZITHROMAX) 250 MG tablet  Abdominal exam negative for tenderness in office today, she will follow-up with cardiology for intermittent pain under her rib cage and elevated blood pressure.  Agrees with plan of care discussed.  Questions answered.  Return if symptoms worsen or fail to improve.    Chalmers Guest, FNP

## 2022-05-22 ENCOUNTER — Other Ambulatory Visit (HOSPITAL_BASED_OUTPATIENT_CLINIC_OR_DEPARTMENT_OTHER): Payer: Self-pay

## 2022-05-23 ENCOUNTER — Telehealth: Payer: Self-pay | Admitting: Internal Medicine

## 2022-05-23 NOTE — Telephone Encounter (Signed)
Pt c/o medication issue:  1. Name of Medication:  Plavix 75 MG  2. How are you currently taking this medication (dosage and times per day)?   3. Are you having a reaction (difficulty breathing--STAT)?   4. What is your medication issue?   Patient states she has been having an ache underneath her ribcage on the right side. She states she was put on Plavix 75 MG once daily while admitted in the hospital and she assumes this may be causing the aches.

## 2022-05-23 NOTE — Telephone Encounter (Signed)
Called patient unable to leave message. Voicemail did not state patient's name.

## 2022-05-24 NOTE — Telephone Encounter (Signed)
Spoke with pt who states she has not done any lifting/ moving or exercising.  Nothing seems to make the pain worse.  It will randomly start and continue until she can lie down.   Pt advised will forward information back to NP and await further recommendation. Pt advised to continue Plavix due to recent stent placement.

## 2022-05-24 NOTE — Telephone Encounter (Signed)
Spoke with pt who complains of pain under left breast /ribcage x 2 weeks.  Pt reports pain is constant and unrelieved with extra strength tylenol. She is able to get relief when she lies down on her back.  She states Plavix is the only recent change since stent placement.  She has seen her PCP for sinusitis who did not find a cause for the pain.  Pt was advised to contact cardiology. Pt advised will forward to PharmD for review and further recommendation.  Pt verbalizes understanding and agrees with current plan.

## 2022-05-24 NOTE — Telephone Encounter (Signed)
Pt returning call, pt confirmed this is the right number to call

## 2022-05-28 NOTE — Telephone Encounter (Signed)
I spoke with patient. Her symptoms are kind of vague. Pain is under left breast and cannot be reproduced with palpation or position changes. She would like to be seen at [facility]. She is going to call and get set up with an visit. Discussed ED precautions.

## 2022-06-02 NOTE — Progress Notes (Unsigned)
**Note Sara-Identified via Obfuscation** Cardiology Office Note Date:  06/04/2022  Patient ID:  Sara, [name] 09/12/1945, MRN [id number] PCP:  Sara [name], Sara J, MD  Electrophysiologist: Sara [name]   Chief Complaint:   planned f/u  History of Present Illness: Sara [name] is a 77 y.o. female with history of barrett's esophagus, GERD, hiatal hernia, HTN, HLD, palpitations, PACs, CAD (PCI to RCA 04/2022)  She [name] in today to be seen for Sara [name], referred to him June 2022 by he PMD, noted a monitor she wore with: " burden of PACs and couplets to the tune of about 90% of her total beats but also frequent runs of SVT (417) the longest lasting 2 minutes and the fastest heart rates in the 250 range.  1 of these episodes was associated with the aforementioned symptoms of chest discomfort and shortness of breath and not associated with palpitations.  She also had multiple episodes of tachycardia during waking hours that were not associated with device activation.  This not withstanding heart rates greater than 200s" Wrapped up : frequent episodes of irregular rapid atrial tachycardia not clearly atrial fibrillation as there are intermittent clearly discernible P waves Planned to stop amlodipine and start verapamil Get an echo and CTa vs stress test No clear AFib, not started on a/c.  She saw Sara [name] August 2022 for follow up, Sara [name] felt Sara [name] was without ischemia, LVEF by her echo preserved 55-60%, asymmetric LVH ?HCM . C.mRI noted mild asymmetrical LVH NOT c/w HCM  Planned to continue verapamil with some improvement (not resolved) palpitations, discussed perhaps dilt vs flecainide   I saw her 02/23/21 She is feeling better. Rare awareness of palpitations, these can sometimes be associated with a slight dizziness, though much improved then prior to being on verapamil. She has some chest heaviness this also pre-dates verapamil and is also better, but not resolved This is exertional, not with ADLs but  with heavier exertion like carrying things, moving furniture to clean, is is associated with SOB and resolves fairly quickly with rest. She likes to walk for exercise, prefers the park with benches that she can rest on when her back hurts or feels SOB (this not new) She will occ feel lightheaded when she first gets up in the AM No syncope or near syncope With some ongoing symptoms of CP and perhaps some peri-infarct ischemia, planned for coronary CT  CT was cancelled 2/2 HR irregularity  COVID + 04/23/21  Coronary CT was completed with some limitations, not likely to have obstructive disease, not sent for [facility] Discussed findings with the patient,  In further discussion, some less cardiac sounding and perhaps her hiatal hernia. We discussed the limitations of her CT 2/2 artifact, her stress test being low risk, Sara [name] did not think there was any ischemia. At this time, plan to monitor her symptoms, discussed if any escalation will plan for cath. Discussed need for aggressive risk modification and strict cholesterol control, her crestor increased with plans for labs  I saw her 07/11/22 She is doing well Noticed with the increase in Crestor dose a bit of brain fog. Her PMD put a referral in for surgery evaluate for her hernia She is not bothered at all by palpitations No CP, "really feel well!" No near syncope or syncope No changes made  She saw Sara [name]  01/15/22: palpitations remained improved, ASA added for her NOD, some SOB (not different since her COVID infection), so low dose lasix added.  As part  of a pre-op evaluation w/D. Dunn, PA-C, c/o DOE, in d/w Sara [name], planned for stress PET  >> abnormal >> cath with PCI to RCA  She saw D. Wittenborn, Sara [name] 05/13/22, doing well post cath with improving DOE,  Planned to postpone hip surgery 6 months to allow uninterrupted DAPT.   TODAY She reports that a couple weeks after her PCI/starting on the plavix she has developed black stools, and  LUQ pain. The pain is just inferior to the rib boarder, is it sharp (like a kidney stone) and can last 5-20 minutes. Tylenol does help though has on one occasion had to take 3 to get it to help  She denies any CP otherwise. No palpitations, or cardiac awareness She has a GI MD,  Sara [name], says she does not need a formal referral, and plans to call for an apt. She saw her PMD yesterday who agreed with GI evaluation. Did not check her stool/do labs   No near syncope or syncope. No visible blood    Past Medical History:  Diagnosis Date   Anxiety 07/2020   Aortic dilatation ([place]) 03/25/2022   38 mm noted on echo   Arthritis 11/2017   Barrett's esophagus    CAD (coronary artery disease)    Cataract    had surgery   Chronic kidney disease, stage 3a (HCC)    GERD (gastroesophageal reflux disease)    hx of    HIATAL HERNIA    s/p surgical repair   Hyperlipidemia    HYPERTENSION    Kidney stone    1990s   LVH (left ventricular hypertrophy)    Osteoporosis    PAT (paroxysmal atrial tachycardia)    Premature atrial contractions    Wellness examination 08/22/2021    Past Surgical History:  Procedure Laterality Date   ABDOMINAL HYSTERECTOMY  06/12/2012   CATARACT EXTRACTION  2009   both eyes   CHOLECYSTECTOMY  11/02/2012   Procedure: CHOLECYSTECTOMY;  Surgeon: Sara Earls, MD;  Location: [facility];  Service: General;;   COLOSTOMY     CORONARY STENT INTERVENTION N/A 04/29/2022   Procedure: CORONARY STENT INTERVENTION;  Surgeon: Sara Bush, MD;  Location: [place] [facility];  Service: Cardiovascular;  Laterality: N/A;   CYSTOCELE REPAIR N/A 06/12/2012   Procedure: ANTERIOR REPAIR (CYSTOCELE);  Surgeon: Elveria Royals, MD;  Location: [facility];  Service: Gynecology;  Laterality: N/A;   EYE SURGERY  Cataract removal 2009   HERNIA REPAIR  2011   HIATAL HERNIA REPAIR  2011   INTRAVASCULAR ULTRASOUND/IVUS N/A 04/29/2022   Procedure: Intravascular Ultrasound/IVUS;  Surgeon:  Sara Bush, MD;  Location: [place] [facility];  Service: Cardiovascular;  Laterality: N/A;   LITHOTRIPSY     RIGHT/LEFT HEART CATH AND CORONARY ANGIOGRAPHY N/A 04/29/2022   Procedure: RIGHT/LEFT HEART CATH AND CORONARY ANGIOGRAPHY;  Surgeon: Sara Bush, MD;  Location: [place] [facility];  Service: Cardiovascular;  Laterality: N/A;   TONSILLECTOMY  1968   UPPER GASTROINTESTINAL ENDOSCOPY     UPPER GI ENDOSCOPY  11/02/2012   Procedure: UPPER GI ENDOSCOPY;  Surgeon: Sara Earls, MD;  Location: [facility];  Service: General;;   VAGINAL HYSTERECTOMY N/A 06/12/2012   Procedure: HYSTERECTOMY VAGINAL;  Surgeon: Elveria Royals, MD;  Location: [place] [facility];  Service: Gynecology;  Laterality: N/A;    Current Outpatient Medications  Medication Sig Dispense Refill   aspirin EC 81 MG tablet Take 1 tablet (81 mg total) by mouth daily. 30 tablet 12   Cholecalciferol (  QC VITAMIN D3) 50 MCG (2000 UT) CAPS Take 1 capsule by mouth daily.     clopidogrel (PLAVIX) 75 MG tablet Take 1 tablet (75 mg total) by mouth daily. 30 tablet 11   COVID-19 mRNA vaccine 2023-2024 (COMIRNATY) SUSP injection Inject into the muscle. 0.3 mL 0   cyanocobalamin (VITAMIN B12) 1000 MCG tablet Take 1,000 mcg by mouth daily.     furosemide (LASIX) 20 MG tablet Take 1 tablet by mouth daily for 3 days, then take as needed for fluid (Patient taking differently: Take 20 mg by mouth daily as needed for edema.) 30 tablet 3   methocarbamol (ROBAXIN) 500 MG tablet Take 1-2 tablets (500-1,000 mg total) by mouth every 6 to 8 hours as needed for muscle spasms/tension. 60 tablet 2   Multiple Vitamin (MULTIVITAMIN WITH MINERALS) TABS Take 1 tablet by mouth daily.     nitroGLYCERIN (NITROSTAT) 0.4 MG SL tablet Place 1 tablet (0.4 mg total) under the tongue every 5 (five) minutes as needed for chest pain. 25 tablet 3   Probiotic Product (PROBIOTIC DAILY PO) Take 1 capsule by mouth daily.     rosuvastatin (CRESTOR) 5 MG tablet Take 1 tablet (5 mg  total) by mouth daily. 90 tablet 3   verapamil (CALAN-SR) 180 MG CR tablet Take 1 tablet (180 mg total) by mouth daily. 90 tablet 3   Zoster Vaccine Adjuvanted [facility]) injection Inject into the muscle. 0.5 mL 1   No current facility-administered medications for this visit.    Allergies:   Augmentin [amoxicillin-pot clavulanate], Metoprolol, and Nsaids   Social History:  The patient  reports that she has never smoked. She has been exposed to tobacco smoke. She has never used smokeless tobacco. She reports that she does not drink alcohol and does not use drugs.   Family History:  The patient's family history includes Asthma in her maternal grandmother; Breast cancer in her maternal grandmother and another family member; Breast cancer (age of onset: 45) in her mother; Cancer in her maternal grandmother, mother, paternal aunt, and paternal uncle; Colon cancer in her paternal aunt; Early death in her father and paternal uncle; Heart disease in her father and another family member; Hypertension in her mother and another family member; Kidney disease in her father.  ROS:  Please see the history of present illness.    All other systems are reviewed and otherwise negative.   PHYSICAL EXAM:  VS:  BP (!) 140/84   Pulse 83   Ht '5\' 6"'$  (1.676 m)   Wt 161 lb 6.4 oz (73.2 kg)   LMP  (LMP Unknown)   SpO2 95%   BMI 26.05 kg/m  BMI: Body mass index is 26.05 kg/m. Well nourished, well developed, in no acute distress HEENT: normocephalic, atraumatic Neck: no JVD, carotid bruits or masses Cardiac:  RRR; no significant murmurs, no rubs, or gallops Lungs: CTA b/l, no wheezing, rhonchi or rales Abd: soft, nontender MS: no deformity or atrophy Ext: no edema Skin: warm and dry, no rash Neuro:  No gross deficits appreciated Psych: euthymic mood, full affect    EKG: done today and reviewed by myself  SR 83bpm, PAC, no new changes from prior   04/29/22: R/LHC Conclusions: Severe single-vessel  coronary artery disease with 99% proximal/mid RCA lesion with TIMI-1 flow and left-to-right collaterals. Mild, non-obstructive coronary artery disease involving the LMCA, LAD, and LCx, as detailed below. Normal left and right-heart filling pressures. Normal Fick cardiac output/index. Successful IVUS-guided PCI to proximal/mid RCA using Synergy  3.0 x 32 mm drug-eluting stent (post-dilated to 3.3 mm) with 0% residual stenosis and TIMI-3 flow.   Recommendations: Dual antiplatelet therapy with aspirin and clopidogrel for at least 3 months, ideally 6 months. Aggressive secondary prevention of coronary artery disease. Anticipate same-day discharge if no post-catheterization complications occur during 6 hours of monitoring.    04/16/22 PET CT -    LV perfusion is abnormal. There is evidence of ischemia. There is no evidence of infarction. Defect 1: There is a medium defect with moderate reduction in uptake present in the apical to basal inferior and inferoseptal location(s) that is reversible. There is normal wall motion in the defect area. Consistent with ischemia.   Rest left ventricular function is abnormal. End diastolic cavity size is normal. End systolic cavity size is normal.   Myocardial blood flow was computed to be 1.38m/g/min at rest and 2.342mg/min at stress. Global myocardial blood flow reserve was 1.68 and was abnormal.   Coronary calcium was present on the attenuation correction CT images. Severe coronary calcifications were present. Coronary calcifications were present in the left anterior descending artery, left circumflex artery and right coronary artery distribution(s).   Findings are consistent with ischemia. The study is intermediate risk.   Blood flow reserve severely abnormal in RCA distribution 0.96   03/27/22: TTE  1. Left ventricular ejection fraction, by estimation, is 60 to 65%. The  left ventricle has normal function. The left ventricle has no regional  wall motion  abnormalities. There is mild asymmetric left ventricular  hypertrophy of the basal-septal segment.  Left ventricular diastolic parameters were normal.   2. Right ventricular systolic function is normal. The right ventricular  size is normal. There is normal pulmonary artery systolic pressure.   3. The mitral valve is normal in structure. Trivial mitral valve  regurgitation. No evidence of mitral stenosis.   4. The aortic valve is grossly normal. There is mild calcification of the  aortic valve. There is mild thickening of the aortic valve. Aortic valve  regurgitation is not visualized. Aortic valve sclerosis/calcification is  present, without any evidence of  aortic stenosis.   5. Aortic dilatation noted. There is borderline dilatation of the  ascending aorta, measuring 38 mm.   6. The inferior vena cava is normal in size with greater than 50%  respiratory variability, suggesting right atrial pressure of 3 mmHg.   Comparison(s): No significant change from prior study.    05/29/2021: Coronary CT IMPRESSION: 1. Coronary artery calcium score 929 Agatston units. This places the patient in the 95th percentile for age and gender, suggesting high risk for future cardiac events.   2. Significant artifact obscuring the mid to distal vessels (RCA, LAD, LCX). Unable to comment on the mid to distal RCA. Probably no severe stenosis in the mid to distal LAD and LCX. With artifact, will not send for FFR.   Overall, no definite severe stenosis, but given the degree of artifact, would consider functional study.   10/31/2020: c.MRI IMPRESSION: 1. Asymmetric LV hypertrophy measuring 1231mn basal septum (5mm57m posterior wall). Does not meet criteria for hypertrophic cardiomyopathy (less than 15mm65ml thickness)   2.  No lata gadolinium enhancement to suggest myocardial scar   3.  Normal LV size and systolic function (EF 55%) [id number].  Normal RV size and systolic function (EF 61%) [id number].  Large  hiatal hernia   10/10/2020: TTE IMPRESSIONS   1. Left ventricular ejection fraction, by estimation, is 55 to 60%.  Left  ventricular ejection fraction by 3D volume is 57 %. The left ventricle has  normal function. The left ventricle has no regional wall motion  abnormalities. There is moderate  asymmetric left ventricular hypertrophy of the basal-septal segment. Left  ventricular diastolic parameters are indeterminate. The average left  ventricular global longitudinal strain is -21.2 %.   2. Right ventricular systolic function is normal. The right ventricular  size is normal. There is normal pulmonary artery systolic pressure. The  estimated right ventricular systolic pressure is [id number] mmHg.   3. The mitral valve is normal in structure. Trivial mitral valve  regurgitation. No evidence of mitral stenosis.   4. The aortic valve is tricuspid. Aortic valve regurgitation is not  visualized. Mild aortic valve sclerosis is present, with no evidence of  aortic valve stenosis.   5. Aortic dilatation noted. There is mild dilatation of the ascending  aorta, measuring 38 mm.   6. The inferior vena cava is normal in size with greater than 50%  respiratory variability, suggesting right atrial pressure of 3 mmHg.    10/10/2020: stress myoview The left ventricular ejection fraction is normal (55-65%). Nuclear stress EF: 60%. There was no ST segment deviation noted during stress. Defect 1: There is a medium defect of severe severity present in the basal inferoseptal, basal inferior, mid inferoseptal and mid inferior location. Findings consistent with prior myocardial infarction with peri-infarct ischemia. This is a low risk study.   Abnormal, low risk stress nuclear study with prior inferior infarct and minimal peri-infarct ischemia.  Gated ejection fraction 60% with hypokinesis of the basal inferior wall.  Recent Labs: 10/09/2021: ALT 12 02/27/2022: Magnesium 2.0; NT-Pro BNP 203; TSH  1.050 04/23/2022: Platelets 266 04/29/2022: Hemoglobin 12.2 05/13/2022: BUN 14; Creatinine, Ser 1.01; Potassium 4.3; Sodium 141  10/09/2021: Chol/HDL Ratio 2.2; Cholesterol, Total 120; HDL 55; LDL Chol Calc (NIH) 47; Triglycerides 92   CrCl cannot be calculated (Patient's most recent lab result is older than the maximum 21 days allowed.).   Wt Readings from Last 3 Encounters:  06/04/22 161 lb 6.4 oz (73.2 kg)  06/03/22 161 lb (73 kg)  05/21/22 160 lb 9.6 oz (72.8 kg)     Other studies reviewed: Additional studies/records reviewed today include: summarized above  ASSESSMENT AND PLAN:  Palpitations PACs, AT None reported  HTN Looks ok   CAD PCI to RCA 04/29/22 No CP, DOE/SOB is better  LUQ pain and new reports of melena CBC today and send her for FOB as well She will call her GI MD, Dr. Rande Brunt office to get an appointment In d/w DOD today, Dr. Angelena Form, would continue ASA/Plavix until she sees GI, unless her CBC [name] back markedly anemic. If that were the case, she is past 30days post PCI and could if needed hold her plavix.  I will forward my note to GI  She needs general cardiology, Dr. Druscilla Brownie office is too far away. Will have her see gen cards APP/MD (1st available) in 2-3 weeks, sooner if needed  Disposition: otherwise with EP in 70mo sooner if needed   Current medicines are reviewed at length with the patient today.  The patient did not have any concerns regarding medicines.  SVenetia Night PA-C 06/04/2022 9:03 AM     [place] [phone](office)  [phone](fax)

## 2022-06-03 ENCOUNTER — Other Ambulatory Visit (HOSPITAL_BASED_OUTPATIENT_CLINIC_OR_DEPARTMENT_OTHER): Payer: Self-pay

## 2022-06-03 ENCOUNTER — Ambulatory Visit (INDEPENDENT_AMBULATORY_CARE_PROVIDER_SITE_OTHER): Payer: Medicare Other | Admitting: Family Medicine

## 2022-06-03 ENCOUNTER — Encounter (HOSPITAL_BASED_OUTPATIENT_CLINIC_OR_DEPARTMENT_OTHER): Payer: Self-pay | Admitting: Family Medicine

## 2022-06-03 VITALS — BP 139/65 | HR 82 | Ht 66.0 in | Wt 161.0 lb

## 2022-06-03 DIAGNOSIS — R079 Chest pain, unspecified: Secondary | ICD-10-CM

## 2022-06-03 NOTE — Assessment & Plan Note (Signed)
Patient previously seen a few weeks ago in our office with some reported left chest wall/rib pain at that time.  It was felt to be of uncertain etiology during evaluation.  She did have recent cardiac procedure done and there was concern for potential association with that recent procedure as to the source of her pain.  She continues to have pain at this time.  Since her evaluation in our office, she has noted some change in bowel movements, indicating that bowel movements have become less solid and has been more "tarry".  She has not noticed any blood in stool.  She does indicate that she is in the process of arranging for follow-up screening colonoscopy with her GI provider, however this had been postponed in recent months.  She has not noticed any urinary issues.  She denies noticing any rash or other skin changes. On exam, patient is in no acute distress, vital signs stable.  Cardiovascular exam with regular rate and rhythm, lungs clear to auscultation bilaterally.  She does not have any tenderness to palpation through spinous processes.  Abdomen with normal bowel sounds, abdomen is soft, nontender, nondistended. Uncertain etiology.  Given timing in relation to cardiac procedure, feel that proceeding with additional cardiac evaluation is reasonable.  Given new bowel movement changes, additionally feel that evaluation with gastroenterologist would be warranted as well.  She plans to contact her GI provider and arrange for follow-up appointment.  She has appointment with cardiology tomorrow.

## 2022-06-03 NOTE — Telephone Encounter (Signed)
Pt has an appointment to be seen by Tommye Standard, PA-C on 06/04/2022.

## 2022-06-03 NOTE — Progress Notes (Signed)
    Procedures performed today:    None.  Independent interpretation of notes and tests performed by another provider:   None.  Brief History, Exam, Impression, and Recommendations:    BP 139/65 (BP Location: Left Arm, Patient Position: Sitting, Cuff Size: Large)   Pulse 82   Ht 5\' 6"  (1.676 m)   Wt 161 lb (73 kg)   LMP  (LMP Unknown)   SpO2 100%   BMI 25.99 kg/m   Left-sided chest pain Patient previously seen a few weeks ago in our office with some reported left chest wall/rib pain at that time.  It was felt to be of uncertain etiology during evaluation.  She did have recent cardiac procedure done and there was concern for potential association with that recent procedure as to the source of her pain.  She continues to have pain at this time.  Since her evaluation in our office, she has noted some change in bowel movements, indicating that bowel movements have become less solid and has been more "tarry".  She has not noticed any blood in stool.  She does indicate that she is in the process of arranging for follow-up screening colonoscopy with her GI provider, however this had been postponed in recent months.  She has not noticed any urinary issues.  She denies noticing any rash or other skin changes. On exam, patient is in no acute distress, vital signs stable.  Cardiovascular exam with regular rate and rhythm, lungs clear to auscultation bilaterally.  She does not have any tenderness to palpation through spinous processes.  Abdomen with normal bowel sounds, abdomen is soft, nontender, nondistended. Uncertain etiology.  Given timing in relation to cardiac procedure, feel that proceeding with additional cardiac evaluation is reasonable.  Given new bowel movement changes, additionally feel that evaluation with gastroenterologist would be warranted as well.  She plans to contact her GI provider and arrange for follow-up appointment.  She has appointment with cardiology  tomorrow.   ___________________________________________ Elza Sortor de [place], MD, ABFM, CAQSM [facility] [place]

## 2022-06-04 ENCOUNTER — Encounter: Payer: Self-pay | Admitting: Physician Assistant

## 2022-06-04 ENCOUNTER — Ambulatory Visit: Payer: Medicare Other | Attending: Physician Assistant | Admitting: Physician Assistant

## 2022-06-04 VITALS — BP 140/84 | HR 83 | Ht 66.0 in | Wt 161.4 lb

## 2022-06-04 DIAGNOSIS — I4719 Other supraventricular tachycardia: Secondary | ICD-10-CM

## 2022-06-04 DIAGNOSIS — Z79899 Other long term (current) drug therapy: Secondary | ICD-10-CM | POA: Diagnosis not present

## 2022-06-04 DIAGNOSIS — I10 Essential (primary) hypertension: Secondary | ICD-10-CM

## 2022-06-04 DIAGNOSIS — I2510 Atherosclerotic heart disease of native coronary artery without angina pectoris: Secondary | ICD-10-CM | POA: Diagnosis not present

## 2022-06-04 DIAGNOSIS — R1030 Lower abdominal pain, unspecified: Secondary | ICD-10-CM | POA: Diagnosis not present

## 2022-06-04 LAB — CBC
Hematocrit: 42.2 % (ref 34.0–46.6)
Hemoglobin: 14.2 g/dL (ref 11.1–15.9)
MCH: 30.5 pg (ref 26.6–33.0)
MCHC: 33.6 g/dL (ref 31.5–35.7)
MCV: 91 fL (ref 79–97)
Platelets: 272 10*3/uL (ref 150–450)
RBC: 4.65 x10E6/uL (ref 3.77–5.28)
RDW: 13.7 % (ref 11.7–15.4)
WBC: 6.4 10*3/uL (ref 3.4–10.8)

## 2022-06-04 NOTE — Patient Instructions (Signed)
Medication Instructions:    Your physician recommends that you continue on your current medications as directed. Please refer to the Current Medication list given to you today.     *If you need a refill on your cardiac medications before your next appointment, please call your pharmacy*   Lab Work:  CBC TODAY     If you have labs (blood work) drawn today and your tests are completely normal, you will receive your results only by: [place] (if you have MyChart) OR A paper copy in the mail If you have any lab test that is abnormal or we need to change your treatment, we will call you to review the results.   Testing/Procedures:     Follow-Up: At [facility], you and your health needs are our priority.  As part of our continuing mission to provide you with exceptional heart care, we have created designated Provider Care Teams.  These Care Teams include your primary Cardiologist (physician) and Advanced Practice Providers (APPs -  Physician Assistants and Nurse Practitioners) who all work together to provide you with the care you need, when you need it.  We recommend signing up for the patient portal called "MyChart".  Sign up information is provided on this After Visit Summary.  MyChart is used to connect with patients for Virtual Visits (Telemedicine).  Patients are able to view lab/test results, encounter notes, upcoming appointments, etc.  Non-urgent messages can be sent to your provider as well.   To learn more about what you can do with MyChart, go to [email or web address].    Your next appointment:  [facility] /APP  2 -3 week(s)  Provider:   Virl Axe, MD  IN 6 MONTHS    Other Instructions

## 2022-06-05 ENCOUNTER — Encounter (HOSPITAL_BASED_OUTPATIENT_CLINIC_OR_DEPARTMENT_OTHER): Payer: Self-pay

## 2022-06-05 ENCOUNTER — Other Ambulatory Visit (HOSPITAL_COMMUNITY): Payer: Self-pay

## 2022-06-05 DIAGNOSIS — R1030 Lower abdominal pain, unspecified: Secondary | ICD-10-CM | POA: Diagnosis not present

## 2022-06-06 ENCOUNTER — Encounter (HOSPITAL_COMMUNITY): Payer: Self-pay

## 2022-06-06 ENCOUNTER — Telehealth (HOSPITAL_COMMUNITY): Payer: Self-pay

## 2022-06-06 NOTE — Telephone Encounter (Signed)
Attempted to call patient in regards to Cardiac Rehab - LM on VM Mailed letter 

## 2022-06-09 LAB — STOOL CULTURE: E coli, Shiga toxin Assay: NEGATIVE

## 2022-06-11 ENCOUNTER — Other Ambulatory Visit: Payer: Self-pay | Admitting: *Deleted

## 2022-06-11 DIAGNOSIS — Z1211 Encounter for screening for malignant neoplasm of colon: Secondary | ICD-10-CM

## 2022-06-13 DIAGNOSIS — Z1211 Encounter for screening for malignant neoplasm of colon: Secondary | ICD-10-CM | POA: Diagnosis not present

## 2022-06-17 ENCOUNTER — Ambulatory Visit (INDEPENDENT_AMBULATORY_CARE_PROVIDER_SITE_OTHER): Payer: Medicare Other | Admitting: Family Medicine

## 2022-06-17 ENCOUNTER — Other Ambulatory Visit (HOSPITAL_BASED_OUTPATIENT_CLINIC_OR_DEPARTMENT_OTHER): Payer: Self-pay

## 2022-06-17 ENCOUNTER — Telehealth (HOSPITAL_COMMUNITY): Payer: Self-pay

## 2022-06-17 ENCOUNTER — Encounter (HOSPITAL_BASED_OUTPATIENT_CLINIC_OR_DEPARTMENT_OTHER): Payer: Self-pay | Admitting: Family Medicine

## 2022-06-17 VITALS — BP 150/80 | HR 78 | Ht 66.0 in | Wt 166.4 lb

## 2022-06-17 DIAGNOSIS — N3000 Acute cystitis without hematuria: Secondary | ICD-10-CM | POA: Diagnosis not present

## 2022-06-17 LAB — POCT URINALYSIS DIPSTICK
Bilirubin, UA: NEGATIVE
Blood, UA: NEGATIVE
Glucose, UA: NEGATIVE
Ketones, UA: NEGATIVE
Nitrite, UA: NEGATIVE
Protein, UA: NEGATIVE
Spec Grav, UA: 1.025 (ref 1.010–1.025)
Urobilinogen, UA: 0.2 E.U./dL
pH, UA: 5.5 (ref 5.0–8.0)

## 2022-06-17 MED ORDER — NITROFURANTOIN MONOHYD MACRO 100 MG PO CAPS
100.0000 mg | ORAL_CAPSULE | Freq: Two times a day (BID) | ORAL | 0 refills | Status: DC
  Filled 2022-06-17: qty 14, 7d supply, fill #0

## 2022-06-17 NOTE — Telephone Encounter (Signed)
Pt is not interested in the cardiac rehab program. Closed referral 

## 2022-06-17 NOTE — Progress Notes (Signed)
   Established Patient Office Visit  Subjective   Patient ID: Sara [name], female    DOB: September 24, 1945  Age: 77 y.o. MRN: [id number]  Natalya [name] is a 77 yo female patient that presents today for concern for UTI. She reports taking a home UTI test and the leukocytes being +. Symptoms include urinary frequency, dysuria, sensation of bladder fullness, and cloudiness. She reports having issues with Plavix and has been experiencing diarrhea and black stools. Patient denies flank pain, suprapubic pain, fever/chills, dysuria, hematuria, fatigue. Reviewed allergies with patient.   Review of Systems  Constitutional:  Negative for chills, fever and malaise/fatigue.  Respiratory:  Negative for shortness of breath.   Cardiovascular:  Negative for chest pain.  Gastrointestinal:  Negative for abdominal pain.  Genitourinary:  Positive for dysuria, frequency and urgency. Negative for flank pain and hematuria.       C/o bladder fullness  Neurological:  Negative for weakness.     Objective:     BP (!) 150/80   Pulse 78   Ht 5\' 6"  (1.676 m)   Wt 166 lb 6.4 oz (75.5 kg)   LMP  (LMP Unknown)   SpO2 100%   BMI 26.86 kg/m  BP Readings from Last 3 Encounters:  06/17/22 (!) 150/80  06/04/22 (!) 140/84  06/03/22 139/65     Physical Exam Constitutional:      Appearance: Normal appearance.  Cardiovascular:     Rate and Rhythm: Normal rate and regular rhythm.     Heart sounds: Normal heart sounds.  Pulmonary:     Effort: Pulmonary effort is normal.     Breath sounds: Normal breath sounds.  Abdominal:     General: Bowel sounds are normal.     Palpations: Abdomen is soft.     Tenderness: There is no abdominal tenderness. There is no right CVA tenderness, left CVA tenderness, guarding or rebound.  Neurological:     Mental Status: She is alert.  Psychiatric:        Mood and Affect: Mood normal.        Behavior: Behavior normal.        Thought Content: Thought content normal.         Judgment: Judgment normal.     Assessment & Plan:  1. Acute cystitis without hematuria UA positive for bacteria. Patient presents with dysuria, frequency, urgency, and feeling of an inability to empty her bladder. Prescribed nitrofurantoin 100mg  BID. Denies CVA tenderness, fever/chills, suprapubic pain. Advised patient to follow-up with office if symptoms continue.  - POCT Urinalysis Dipstick - nitrofurantoin, macrocrystal-monohydrate, (MACROBID) 100 MG capsule; Take 1 capsule (100 mg total) by mouth 2 (two) times daily for 7 days.  Dispense: 14 capsule; Refill: 0    Return if symptoms worsen or fail to improve.   Les Pou, FNP

## 2022-06-18 LAB — URINE CULTURE

## 2022-06-18 LAB — FECAL OCCULT BLOOD, IMMUNOCHEMICAL: Fecal Occult Bld: POSITIVE — AB

## 2022-06-23 NOTE — Progress Notes (Signed)
Office Visit    Patient Name: Sara [name] Date of Encounter: 06/24/2022  PCP:  de [place], Raymond J, MD   [place]  Cardiologist:  None  Advanced Practice Provider:  No care team member to display Electrophysiologist:  Virl Axe, MD   Chief Complaint    Sara [name] is a 77 y.o. female with a past medical history of CAD status post PCI with DES to proximal/mid RCA, SVT/atrial ectopy, LVH (not meeting criteria for HCM), hypertension, hyperlipidemia, CKD 3a presents today for follow-up appointment.  The patient was last seen by Dr. Caryl Comes 09/18/2020 for palpitations and chest discomfort at the request of Dr. Verl Blalock.  Patient wore a cardiac monitor showing SVT.  Patient was started on verapamil and her echo showed moderate asymmetric LVH on the basal/septal segment.  Nuclear stress test showed prior inferior infarct and minimal peri-infarct ischemia.  Cardiac MRI to evaluate for HCM showed asymmetric LVH but did not meet criteria for HCM.  At the patient's visit with Dr. Caryl Comes 01/15/2022 there was a question of HFpEF given complaints of DOE with associated edema.  Was started on low-dose diuretics.  Patient was reevaluated by Melina Copa, PA-C on 02/27/2022 for preoperative cardiac risk assessment pending hip arthroplasty.  At that time, she was complaining of a 60-month history of DOE that was slightly better with the addition of Lasix.  Labs were normal.  Echo was unchanged from 2022.  She underwent cardiac catheterization PET/CT at the suggestion of Dr. Caryl Comes which showed evidence of ischemia.  Underwent [facility] 04/29/2022 and received PCI with DES to proximal/mid RCA.  Patient was last seen 05/13/2022 and at that time she reported her back pain had improved since her catheterization.  Still becomes dyspneic with longer distances but felt this was slowly improving.  Stated that walking into the office did not cause shortness of breath and this was an issue  previously.  Hypertensive in the office 162/84 and then 138/78 recheck.  She denied headaches, dizziness, lightheadedness or vision changes.  No chest pain or shortness of breath.  No lower extremity edema.  She does have as needed Lasix to use since she had previous lower extremity edema but had not needed it recently.  Today, she tells me that she has had a bit of a complicated course following her stent placement. She had some pains under he left breast and also a positive stool sample for blood. Her WBCs were elevated and she was started on an antibiotic. Most recent WBC count is normal and pain subsided. She does continue to have issues with her blood pressure being elevated. She was offered to increase her verapamil or add a new medication. She feels like her palpitations are well controlled so she opted to add a new medication.   Reports no shortness of breath nor dyspnea on exertion. Reports no chest pain, pressure, or tightness. No edema, orthopnea, PND. Reports no palpitations.    Past Medical History    Past Medical History:  Diagnosis Date   Anxiety 07/2020   Aortic dilatation 03/25/2022   38 mm noted on echo   Arthritis 11/2017   Barrett's esophagus    CAD (coronary artery disease)    Cataract    had surgery   Chronic kidney disease, stage 3a    GERD (gastroesophageal reflux disease)    hx of    HIATAL HERNIA    s/p surgical repair   Hyperlipidemia    HYPERTENSION  Kidney stone    1990s   LVH (left ventricular hypertrophy)    Osteoporosis    PAT (paroxysmal atrial tachycardia)    Premature atrial contractions    Wellness examination 08/22/2021   Past Surgical History:  Procedure Laterality Date   ABDOMINAL HYSTERECTOMY  06/12/2012   CATARACT EXTRACTION  2009   both eyes   CHOLECYSTECTOMY  11/02/2012   Procedure: CHOLECYSTECTOMY;  Surgeon: Pedro Earls, MD;  Location: [facility];  Service: General;;   COLOSTOMY     CORONARY STENT INTERVENTION N/A 04/29/2022    Procedure: CORONARY STENT INTERVENTION;  Surgeon: Nelva Bush, MD;  Location: [place] [facility];  Service: Cardiovascular;  Laterality: N/A;   CYSTOCELE REPAIR N/A 06/12/2012   Procedure: ANTERIOR REPAIR (CYSTOCELE);  Surgeon: Elveria Royals, MD;  Location: [facility];  Service: Gynecology;  Laterality: N/A;   EYE SURGERY  Cataract removal 2009   HERNIA REPAIR  2011   HIATAL HERNIA REPAIR  2011   INTRAVASCULAR ULTRASOUND/IVUS N/A 04/29/2022   Procedure: Intravascular Ultrasound/IVUS;  Surgeon: Nelva Bush, MD;  Location: [place] [facility];  Service: Cardiovascular;  Laterality: N/A;   LITHOTRIPSY     RIGHT/LEFT HEART CATH AND CORONARY ANGIOGRAPHY N/A 04/29/2022   Procedure: RIGHT/LEFT HEART CATH AND CORONARY ANGIOGRAPHY;  Surgeon: Nelva Bush, MD;  Location: [place] [facility];  Service: Cardiovascular;  Laterality: N/A;   TONSILLECTOMY  1968   UPPER GASTROINTESTINAL ENDOSCOPY     UPPER GI ENDOSCOPY  11/02/2012   Procedure: UPPER GI ENDOSCOPY;  Surgeon: Pedro Earls, MD;  Location: [facility];  Service: General;;   VAGINAL HYSTERECTOMY N/A 06/12/2012   Procedure: HYSTERECTOMY VAGINAL;  Surgeon: Elveria Royals, MD;  Location: [place] [facility];  Service: Gynecology;  Laterality: N/A;    Allergies  Allergies  Allergen Reactions   Augmentin [Amoxicillin-Pot Clavulanate] Diarrhea   Metoprolol     Had prior to cor CT, felt very dizzy   Nsaids     Avoid due to diminished kidney function     EKGs/Labs/Other Studies Reviewed:   The following studies were reviewed today:  Coronary CT 05/29/2021: Calcium score 929 (95th percentile).  Significant artifact obscuring mid to distal vessels.  Likely no severe stenosis in mid to distal LAD and LCx.  Not sent for FFR. Echo 03/27/2022: EF 60 to 65%.  Mild asymmetric LVH basal-septal segment.  Trivial MR.  Aortic valve sclerosis/calcification present without stenosis.  Borderline dilatation of ascending aorta 38 mm. Cardiac PET/CT 04/16/2022: LV  perfusion is abnormal with evidence of ischemia.  Reversible medium defect with moderate reduction in uptake apical to basal inferior and inferoseptal.  Severe coronary calcifications present LAD, LCx, and RCA.  Severely abnormal blood flow reserve RCA distribution. R/LHC 04/29/2022: Nonobstructive CAD LM, LAD, LCx.  Proximal/mid RCA 99%.  PCI with DES 3.0 x 32 mm to proximal/mid RCA.  Normal left and right heart filling pressures, normal Fick cardiac output/index.  Cardiac event monitor 08/15/2020: Predominant underlying rhythm was sinus rhythm.  417 supraventricular tachycardia runs fastest lasting 1 minute 51 seconds max rate 266 bpm, longest 2 minutes 1 second with average rate 216 bpm.  Rare PACs and PVCs.  No atrial fibrillation.  Patient was symptomatic during SVT episodes.  Echo 10/10/2020: EF 55 to 60%.  Moderate asymmetric LVH of the basal-septal segment.  Trivial MR.  Mild aortic valve sclerosis without stenosis.  Mild dilatation of ascending aorta 38 mm. Cardiac MRI 10/31/2020: Asymmetric LVH measuring 12 mm and basal septum, does not meet  criteria for HCM.  No suggestion of myocardial scar.  LV/RV size and function normal.  Large hiatal hernia. Repeat echo 03/27/2022 outlined above (see CAD).  EKG:  EKG is not ordered today.   Recent Labs: 10/09/2021: ALT 12 02/27/2022: Magnesium 2.0; NT-Pro BNP 203; TSH 1.050 05/13/2022: BUN 14; Creatinine, Ser 1.01; Potassium 4.3; Sodium 141 06/04/2022: Hemoglobin 14.2; Platelets 272  Recent Lipid Panel    Component Value Date/Time   CHOL 120 10/09/2021 0720   TRIG 92 10/09/2021 0720   HDL 55 10/09/2021 0720   CHOLHDL 2.2 10/09/2021 0720   CHOLHDL 2 05/31/2020 0841   VLDL 19.6 05/31/2020 0841   LDLCALC 47 10/09/2021 0720   Home Medications   Current Meds  Medication Sig   aspirin EC 81 MG tablet Take 1 tablet (81 mg total) by mouth daily.   Cholecalciferol (QC VITAMIN D3) 50 MCG (2000 UT) CAPS Take 1 capsule by mouth daily.   clopidogrel (PLAVIX)  75 MG tablet Take 1 tablet (75 mg total) by mouth daily.   COVID-19 mRNA vaccine 2023-2024 (COMIRNATY) SUSP injection Inject into the muscle.   cyanocobalamin (VITAMIN B12) 1000 MCG tablet Take 1,000 mcg by mouth daily.   furosemide (LASIX) 20 MG tablet Take 1 tablet by mouth daily for 3 days, then take as needed for fluid (Patient taking differently: Take 20 mg by mouth daily as needed for edema.)   losartan (COZAAR) 25 MG tablet Take 1 tablet (25 mg total) by mouth daily.   methocarbamol (ROBAXIN) 500 MG tablet Take 1-2 tablets (500-1,000 mg total) by mouth every 6 to 8 hours as needed for muscle spasms/tension.   Multiple Vitamin (MULTIVITAMIN WITH MINERALS) TABS Take 1 tablet by mouth daily.   nitroGLYCERIN (NITROSTAT) 0.4 MG SL tablet Place 1 tablet (0.4 mg total) under the tongue every 5 (five) minutes as needed for chest pain.   Probiotic Product (PROBIOTIC DAILY PO) Take 1 capsule by mouth daily.   rosuvastatin (CRESTOR) 5 MG tablet Take 1 tablet (5 mg total) by mouth daily.   verapamil (CALAN-SR) 180 MG CR tablet Take 1 tablet (180 mg total) by mouth daily.   Zoster Vaccine Adjuvanted [facility]) injection Inject into the muscle.     Review of Systems      All other systems reviewed and are otherwise negative except as noted above.  Physical Exam    VS:  BP (!) 144/89   Pulse 88   Ht 5\' 6"  (1.676 m)   Wt 162 lb 6.4 oz (73.7 kg)   LMP  (LMP Unknown)   SpO2 95%   BMI 26.21 kg/m  , BMI Body mass index is 26.21 kg/m.  Wt Readings from Last 3 Encounters:  06/24/22 162 lb 6.4 oz (73.7 kg)  06/17/22 166 lb 6.4 oz (75.5 kg)  06/04/22 161 lb 6.4 oz (73.2 kg)     GEN: Well nourished, well developed, in no acute distress. HEENT: normal. Neck: Supple, no JVD, carotid bruits, or masses. Cardiac: RRR, no murmurs, rubs, or gallops. No clubbing, cyanosis, edema.  Radials/PT 2+ and equal bilaterally.  Respiratory:  Respirations regular and unlabored, clear to auscultation  bilaterally. GI: Soft, nontender, nondistended. MS: No deformity or atrophy. Skin: Warm and dry, no rash. Neuro:  Strength and sensation are intact. Psych: Normal affect.  Assessment & Plan    CAD with recent PCI to proximal to mid RCA -Continue current medications including aspirin 81 mg daily, Plavix 75 mg daily, Lasix 20 mg daily, nitro as needed, Crestor  5 mg daily -Discussed continuing DAPT for at least 6 months -she eventually will need hip surgery where they will need to stop her DAPT. Would suggest addressing this at her next appointment with [place] in Aug  Palpitations/SVT/atrial ectopy -Overall well-controlled on current dose of verapamil 180 mg daily -Heart rate is 88 today  Asymmetric septal hypertrophy/aortic dilatation -will continue to monitor closely -please monitor your BP with added medication today  Hypertension -added losartan 25mg  daily and expect to need some titration - I've asked the patient to monitor BP closely and send me those values after 2 weeks  Hyperlipidemia -Most recent LDL 47, goal less than 70 due to CAD -HDL is 55 and total cholesterol is 120.  Triglycerides 92 -Continue current medications including Crestor 5 mg daily -Will be due for repeat lipid panel when she is next in for follow-up (8/24)    Disposition: Follow up 4 months with None or APP.  Signed, Elgie Collard, PA-C 06/24/2022, 12:50 PM [facility]

## 2022-06-24 ENCOUNTER — Other Ambulatory Visit (HOSPITAL_BASED_OUTPATIENT_CLINIC_OR_DEPARTMENT_OTHER): Payer: Self-pay

## 2022-06-24 ENCOUNTER — Ambulatory Visit: Payer: Medicare Other | Attending: Physician Assistant | Admitting: Physician Assistant

## 2022-06-24 ENCOUNTER — Encounter: Payer: Self-pay | Admitting: Physician Assistant

## 2022-06-24 VITALS — BP 144/89 | HR 88 | Ht 66.0 in | Wt 162.4 lb

## 2022-06-24 DIAGNOSIS — Z955 Presence of coronary angioplasty implant and graft: Secondary | ICD-10-CM

## 2022-06-24 DIAGNOSIS — I2510 Atherosclerotic heart disease of native coronary artery without angina pectoris: Secondary | ICD-10-CM

## 2022-06-24 DIAGNOSIS — I422 Other hypertrophic cardiomyopathy: Secondary | ICD-10-CM

## 2022-06-24 DIAGNOSIS — E785 Hyperlipidemia, unspecified: Secondary | ICD-10-CM

## 2022-06-24 DIAGNOSIS — I77819 Aortic ectasia, unspecified site: Secondary | ICD-10-CM

## 2022-06-24 DIAGNOSIS — I10 Essential (primary) hypertension: Secondary | ICD-10-CM

## 2022-06-24 DIAGNOSIS — I4719 Other supraventricular tachycardia: Secondary | ICD-10-CM | POA: Diagnosis not present

## 2022-06-24 MED ORDER — LOSARTAN POTASSIUM 25 MG PO TABS
25.0000 mg | ORAL_TABLET | Freq: Every day | ORAL | 3 refills | Status: DC
  Filled 2022-06-24: qty 90, 90d supply, fill #0
  Filled 2022-09-16: qty 90, 90d supply, fill #1

## 2022-06-24 NOTE — Patient Instructions (Signed)
Medication Instructions:  Start Losartan (Cozaar) 25 mg daily   *If you need a refill on your cardiac medications before your next appointment, please call your pharmacy*   Lab Work: None ordered   If you have labs (blood work) drawn today and your tests are completely normal, you will receive your results only by: Sara [name] (if you have MyChart) OR A paper copy in the mail If you have any lab test that is abnormal or we need to change your treatment, we will call you to review the results.   Testing/Procedures: None ordered    Follow-Up: At [facility], you and your health needs are our priority.  As part of our continuing mission to provide you with exceptional heart care, we have created designated Provider Care Teams.  These Care Teams include your primary Cardiologist (physician) and Advanced Practice Providers (APPs -  Physician Assistants and Nurse Practitioners) who all work together to provide you with the care you need, when you need it.  We recommend signing up for the patient portal called "MyChart".  Sign up information is provided on this After Visit Summary.  MyChart is used to connect with patients for Virtual Visits (Telemedicine).  Patients are able to view lab/test results, encounter notes, upcoming appointments, etc.  Non-urgent messages can be sent to your provider as well.   To learn more about what you can do with MyChart, go to [email or web address].    Your next appointment:   4 month(s)  Provider:   Dr. Harrell Gave End    Other Instructions

## 2022-06-28 ENCOUNTER — Encounter (HOSPITAL_BASED_OUTPATIENT_CLINIC_OR_DEPARTMENT_OTHER): Payer: Self-pay | Admitting: Family Medicine

## 2022-06-28 ENCOUNTER — Ambulatory Visit (INDEPENDENT_AMBULATORY_CARE_PROVIDER_SITE_OTHER): Payer: Medicare Other | Admitting: Family Medicine

## 2022-06-28 VITALS — BP 145/86 | HR 84 | Ht 66.0 in | Wt 164.0 lb

## 2022-06-28 DIAGNOSIS — R350 Frequency of micturition: Secondary | ICD-10-CM | POA: Insufficient documentation

## 2022-06-28 LAB — POCT URINALYSIS DIPSTICK
Bilirubin, UA: NEGATIVE
Blood, UA: NEGATIVE
Glucose, UA: NEGATIVE
Ketones, UA: NEGATIVE
Leukocytes, UA: NEGATIVE
Nitrite, UA: NEGATIVE
Protein, UA: NEGATIVE
Spec Grav, UA: 1.015 (ref 1.010–1.025)
Urobilinogen, UA: 0.2 E.U./dL
pH, UA: 5 (ref 5.0–8.0)

## 2022-06-28 NOTE — Progress Notes (Signed)
   Acute Office Visit  Subjective:     Patient ID: Sara [name], female    DOB: [birth year], 77 y.o.   MRN: [id number]  Chief Complaint  Patient presents with   Dysuria    Pt here for having frequent urination started about two weeks ago    Urinary Tract Infection: Patient complains of urinary frequency and cloudy urine.  She has had symptoms for 4 days, stating she noticed urinating more frequently since Monday. Denies back pain, fever/chills, burning with urination, nocturia, incontinence, abnormal smelling urine, hematuria, inability to void, suprapubic pressure. Patient was treated with UTI 06/17/2022.  Patient does not have a history of pyelonephritis.    Review of Systems  Constitutional:  Negative for malaise/fatigue.  Respiratory:  Negative for cough and shortness of breath.   Cardiovascular:  Negative for chest pain and palpitations.  Gastrointestinal:  Negative for abdominal pain, nausea and vomiting.  Genitourinary:  Positive for frequency. Negative for dysuria, flank pain, hematuria and urgency.  Musculoskeletal:  Negative for myalgias.  Neurological:  Negative for dizziness, weakness and headaches.     Objective:    BP (!) 145/86 (BP Location: Right Arm, Patient Position: Sitting, Cuff Size: Normal)   Pulse 84   Ht 5\' 6"  (1.676 m)   Wt 164 lb (74.4 kg)   LMP  (LMP Unknown)   SpO2 100%   BMI 26.47 kg/m   Physical Exam Constitutional:      Appearance: Normal appearance.  Cardiovascular:     Rate and Rhythm: Normal rate and regular rhythm.     Pulses: Normal pulses.     Heart sounds: Normal heart sounds.  Pulmonary:     Effort: Pulmonary effort is normal.     Breath sounds: Normal breath sounds.  Abdominal:     General: Bowel sounds are normal.     Palpations: Abdomen is soft.     Tenderness: There is no abdominal tenderness. There is no right CVA tenderness or left CVA tenderness.  Neurological:     Mental Status: She is alert.  Psychiatric:         Mood and Affect: Mood normal.        Behavior: Behavior normal.        Thought Content: Thought content normal.        Judgment: Judgment normal.      Assessment & Plan:  1. Urinary frequency Patient treated for UTI on 3/25. Reports she is still experiencing urinary frequency and some urinary cloudiness. Physical exam unremarkable. No suprapubic discomfort/pain, no CVA tenderness, and no abdominal pain. UA performed today- urine clear and negative for WBCs, reassuring for no infection. Reassured patient that urinary frequency may be related to addition of new blood pressure medication (losartan 25mg  daily)y. Educated her about staying well hydrated. Advised her to return to office if symptoms continue.  - POCT Urinalysis Dipstick   Return if symptoms worsen or fail to improve.  Alyson Reedy, FNP

## 2022-07-01 ENCOUNTER — Other Ambulatory Visit: Payer: Self-pay

## 2022-07-08 ENCOUNTER — Telehealth (HOSPITAL_BASED_OUTPATIENT_CLINIC_OR_DEPARTMENT_OTHER): Payer: Self-pay | Admitting: Family Medicine

## 2022-07-08 NOTE — Telephone Encounter (Signed)
Called patient to schedule Medicare Annual Wellness Visit (AWV). Left message for patient to call back and schedule Medicare Annual Wellness Visit (AWV).  Last date of AWV: 05/20/2019   Please schedule an AWVS appointment at any time with [facility] VISIT.  If any questions, please contact me at [phone].    Thank you,  [facility] Support [facility] Direct dial  [phone]

## 2022-07-12 ENCOUNTER — Inpatient Hospital Stay (HOSPITAL_COMMUNITY)
Admission: EM | Admit: 2022-07-12 | Discharge: 2022-07-15 | DRG: 381 | Disposition: A | Discharge status: Home / Self Care | Payer: Medicare Other | Attending: Internal Medicine | Admitting: Internal Medicine

## 2022-07-12 ENCOUNTER — Emergency Department (HOSPITAL_COMMUNITY): Payer: Medicare Other

## 2022-07-12 ENCOUNTER — Other Ambulatory Visit: Payer: Self-pay

## 2022-07-12 ENCOUNTER — Inpatient Hospital Stay (HOSPITAL_COMMUNITY): Payer: Medicare Other

## 2022-07-12 ENCOUNTER — Encounter (HOSPITAL_COMMUNITY): Payer: Self-pay

## 2022-07-12 DIAGNOSIS — D509 Iron deficiency anemia, unspecified: Secondary | ICD-10-CM | POA: Diagnosis present

## 2022-07-12 DIAGNOSIS — N189 Chronic kidney disease, unspecified: Secondary | ICD-10-CM

## 2022-07-12 DIAGNOSIS — M1990 Unspecified osteoarthritis, unspecified site: Secondary | ICD-10-CM | POA: Diagnosis present

## 2022-07-12 DIAGNOSIS — N179 Acute kidney failure, unspecified: Secondary | ICD-10-CM

## 2022-07-12 DIAGNOSIS — Z881 Allergy status to other antibiotic agents status: Secondary | ICD-10-CM

## 2022-07-12 DIAGNOSIS — I129 Hypertensive chronic kidney disease with stage 1 through stage 4 chronic kidney disease, or unspecified chronic kidney disease: Secondary | ICD-10-CM | POA: Diagnosis present

## 2022-07-12 DIAGNOSIS — R0602 Shortness of breath: Secondary | ICD-10-CM | POA: Diagnosis not present

## 2022-07-12 DIAGNOSIS — I77819 Aortic ectasia, unspecified site: Secondary | ICD-10-CM | POA: Diagnosis present

## 2022-07-12 DIAGNOSIS — Z7902 Long term (current) use of antithrombotics/antiplatelets: Secondary | ICD-10-CM

## 2022-07-12 DIAGNOSIS — R000 Tachycardia, unspecified: Secondary | ICD-10-CM | POA: Diagnosis not present

## 2022-07-12 DIAGNOSIS — Z886 Allergy status to analgesic agent status: Secondary | ICD-10-CM | POA: Diagnosis not present

## 2022-07-12 DIAGNOSIS — D72829 Elevated white blood cell count, unspecified: Secondary | ICD-10-CM | POA: Diagnosis not present

## 2022-07-12 DIAGNOSIS — K2211 Ulcer of esophagus with bleeding: Secondary | ICD-10-CM | POA: Diagnosis not present

## 2022-07-12 DIAGNOSIS — F419 Anxiety disorder, unspecified: Secondary | ICD-10-CM | POA: Diagnosis present

## 2022-07-12 DIAGNOSIS — K449 Diaphragmatic hernia without obstruction or gangrene: Secondary | ICD-10-CM | POA: Diagnosis present

## 2022-07-12 DIAGNOSIS — Z789 Other specified health status: Secondary | ICD-10-CM

## 2022-07-12 DIAGNOSIS — K2210 Ulcer of esophagus without bleeding: Secondary | ICD-10-CM | POA: Diagnosis not present

## 2022-07-12 DIAGNOSIS — K219 Gastro-esophageal reflux disease without esophagitis: Secondary | ICD-10-CM | POA: Diagnosis present

## 2022-07-12 DIAGNOSIS — E875 Hyperkalemia: Secondary | ICD-10-CM | POA: Diagnosis present

## 2022-07-12 DIAGNOSIS — Z841 Family history of disorders of kidney and ureter: Secondary | ICD-10-CM

## 2022-07-12 DIAGNOSIS — Z7722 Contact with and (suspected) exposure to environmental tobacco smoke (acute) (chronic): Secondary | ICD-10-CM | POA: Diagnosis present

## 2022-07-12 DIAGNOSIS — R109 Unspecified abdominal pain: Secondary | ICD-10-CM | POA: Diagnosis not present

## 2022-07-12 DIAGNOSIS — R5383 Other fatigue: Secondary | ICD-10-CM | POA: Diagnosis not present

## 2022-07-12 DIAGNOSIS — M810 Age-related osteoporosis without current pathological fracture: Secondary | ICD-10-CM | POA: Diagnosis not present

## 2022-07-12 DIAGNOSIS — Z90710 Acquired absence of both cervix and uterus: Secondary | ICD-10-CM

## 2022-07-12 DIAGNOSIS — I700 Atherosclerosis of aorta: Secondary | ICD-10-CM | POA: Diagnosis not present

## 2022-07-12 DIAGNOSIS — K2270 Barrett's esophagus without dysplasia: Secondary | ICD-10-CM | POA: Diagnosis not present

## 2022-07-12 DIAGNOSIS — Z7982 Long term (current) use of aspirin: Secondary | ICD-10-CM | POA: Diagnosis not present

## 2022-07-12 DIAGNOSIS — R12 Heartburn: Secondary | ICD-10-CM

## 2022-07-12 DIAGNOSIS — E876 Hypokalemia: Secondary | ICD-10-CM

## 2022-07-12 DIAGNOSIS — I2510 Atherosclerotic heart disease of native coronary artery without angina pectoris: Secondary | ICD-10-CM | POA: Diagnosis present

## 2022-07-12 DIAGNOSIS — Z9049 Acquired absence of other specified parts of digestive tract: Secondary | ICD-10-CM

## 2022-07-12 DIAGNOSIS — K922 Gastrointestinal hemorrhage, unspecified: Secondary | ICD-10-CM

## 2022-07-12 DIAGNOSIS — Z888 Allergy status to other drugs, medicaments and biological substances status: Secondary | ICD-10-CM

## 2022-07-12 DIAGNOSIS — Z8249 Family history of ischemic heart disease and other diseases of the circulatory system: Secondary | ICD-10-CM | POA: Diagnosis not present

## 2022-07-12 DIAGNOSIS — R112 Nausea with vomiting, unspecified: Secondary | ICD-10-CM | POA: Diagnosis not present

## 2022-07-12 DIAGNOSIS — N183 Chronic kidney disease, stage 3 unspecified: Secondary | ICD-10-CM | POA: Diagnosis not present

## 2022-07-12 DIAGNOSIS — I471 Supraventricular tachycardia, unspecified: Secondary | ICD-10-CM | POA: Diagnosis not present

## 2022-07-12 DIAGNOSIS — E8809 Other disorders of plasma-protein metabolism, not elsewhere classified: Secondary | ICD-10-CM | POA: Diagnosis present

## 2022-07-12 DIAGNOSIS — J9811 Atelectasis: Secondary | ICD-10-CM | POA: Diagnosis not present

## 2022-07-12 DIAGNOSIS — N1831 Chronic kidney disease, stage 3a: Secondary | ICD-10-CM | POA: Diagnosis present

## 2022-07-12 DIAGNOSIS — K920 Hematemesis: Secondary | ICD-10-CM | POA: Diagnosis not present

## 2022-07-12 DIAGNOSIS — E785 Hyperlipidemia, unspecified: Secondary | ICD-10-CM | POA: Diagnosis present

## 2022-07-12 DIAGNOSIS — Z86010 Personal history of colonic polyps: Secondary | ICD-10-CM | POA: Diagnosis not present

## 2022-07-12 DIAGNOSIS — Z4659 Encounter for fitting and adjustment of other gastrointestinal appliance and device: Secondary | ICD-10-CM | POA: Diagnosis not present

## 2022-07-12 DIAGNOSIS — R042 Hemoptysis: Secondary | ICD-10-CM | POA: Diagnosis not present

## 2022-07-12 DIAGNOSIS — Z955 Presence of coronary angioplasty implant and graft: Secondary | ICD-10-CM

## 2022-07-12 DIAGNOSIS — R059 Cough, unspecified: Secondary | ICD-10-CM | POA: Diagnosis not present

## 2022-07-12 DIAGNOSIS — D62 Acute posthemorrhagic anemia: Secondary | ICD-10-CM | POA: Diagnosis not present

## 2022-07-12 DIAGNOSIS — Z79899 Other long term (current) drug therapy: Secondary | ICD-10-CM | POA: Diagnosis not present

## 2022-07-12 HISTORY — DX: Other specified health status: Z78.9

## 2022-07-12 LAB — URINALYSIS, ROUTINE W REFLEX MICROSCOPIC
Bacteria, UA: NONE SEEN
Bilirubin Urine: NEGATIVE
Glucose, UA: NEGATIVE mg/dL
Hgb urine dipstick: NEGATIVE
Ketones, ur: NEGATIVE mg/dL
Nitrite: NEGATIVE
Protein, ur: NEGATIVE mg/dL
Specific Gravity, Urine: 1.016 (ref 1.005–1.030)
pH: 5 (ref 5.0–8.0)

## 2022-07-12 LAB — CBC
HCT: 34.7 % — ABNORMAL LOW (ref 36.0–46.0)
Hemoglobin: 11.2 g/dL — ABNORMAL LOW (ref 12.0–15.0)
MCH: 30.9 pg (ref 26.0–34.0)
MCHC: 32.3 g/dL (ref 30.0–36.0)
MCV: 95.9 fL (ref 80.0–100.0)
Platelets: 270 10*3/uL (ref 150–400)
RBC: 3.62 MIL/uL — ABNORMAL LOW (ref 3.87–5.11)
RDW: 13.6 % (ref 11.5–15.5)
WBC: 11.4 10*3/uL — ABNORMAL HIGH (ref 4.0–10.5)
nRBC: 0 % (ref 0.0–0.2)

## 2022-07-12 LAB — BASIC METABOLIC PANEL
Anion gap: 9 (ref 5–15)
BUN: 44 mg/dL — ABNORMAL HIGH (ref 8–23)
CO2: 23 mmol/L (ref 22–32)
Calcium: 8.6 mg/dL — ABNORMAL LOW (ref 8.9–10.3)
Chloride: 106 mmol/L (ref 98–111)
Creatinine, Ser: 1.75 mg/dL — ABNORMAL HIGH (ref 0.44–1.00)
GFR, Estimated: 30 mL/min — ABNORMAL LOW (ref >60)
Glucose, Bld: 141 mg/dL — ABNORMAL HIGH (ref 70–99)
Potassium: 5.2 mmol/L — ABNORMAL HIGH (ref 3.5–5.1)
Sodium: 138 mmol/L (ref 135–145)

## 2022-07-12 LAB — TYPE AND SCREEN

## 2022-07-12 LAB — HEMOGLOBIN AND HEMATOCRIT, BLOOD
HCT: 30.3 % — ABNORMAL LOW (ref 36.0–46.0)
Hemoglobin: 9.6 g/dL — ABNORMAL LOW (ref 12.0–15.0)

## 2022-07-12 LAB — TROPONIN I (HIGH SENSITIVITY)
Troponin I (High Sensitivity): 3 ng/L (ref <18)
Troponin I (High Sensitivity): 11 ng/L (ref <18)

## 2022-07-12 LAB — PREPARE RBC (CROSSMATCH)

## 2022-07-12 LAB — ABO/RH: ABO/RH(D): O POS

## 2022-07-12 LAB — CBG MONITORING, ED: Glucose-Capillary: 135 mg/dL — ABNORMAL HIGH (ref 70–99)

## 2022-07-12 MED ORDER — SODIUM CHLORIDE 0.9% FLUSH
3.0000 mL | Freq: Two times a day (BID) | INTRAVENOUS | Status: DC
  Administered 2022-07-14 – 2022-07-15 (×3): 3 mL via INTRAVENOUS

## 2022-07-12 MED ORDER — PANTOPRAZOLE SODIUM 40 MG IV SOLR: 40.0000 mg | Freq: Two times a day (BID) | INTRAVENOUS | Status: DC

## 2022-07-12 MED ORDER — LACTATED RINGERS IV SOLN: INTRAVENOUS | Status: DC

## 2022-07-12 MED ORDER — ONDANSETRON HCL 4 MG/2ML IJ SOLN
4.0000 mg | Freq: Four times a day (QID) | INTRAMUSCULAR | Status: DC | PRN
  Administered 2022-07-12 – 2022-07-15 (×2): 4 mg via INTRAVENOUS
  Filled 2022-07-12 (×2): qty 2

## 2022-07-12 MED ORDER — SODIUM CHLORIDE 0.9% IV SOLUTION: Freq: Once | INTRAVENOUS | Status: DC

## 2022-07-12 MED ORDER — IOHEXOL 350 MG/ML SOLN
50.0000 mL | Freq: Once | INTRAVENOUS | Status: AC | PRN
  Administered 2022-07-12: 50 mL via INTRAVENOUS

## 2022-07-12 MED ORDER — POLYETHYLENE GLYCOL 3350 17 G PO PACK: 17.0000 g | PACK | Freq: Every day | ORAL | Status: DC | PRN

## 2022-07-12 MED ORDER — PANTOPRAZOLE SODIUM 40 MG IV SOLR
40.0000 mg | Freq: Once | INTRAVENOUS | Status: AC
  Administered 2022-07-12: 40 mg via INTRAVENOUS
  Filled 2022-07-12: qty 10

## 2022-07-12 MED ORDER — OXYCODONE HCL 5 MG PO TABS: 5.0000 mg | ORAL_TABLET | ORAL | Status: DC | PRN

## 2022-07-12 MED ORDER — ONDANSETRON HCL 4 MG PO TABS: 4.0000 mg | ORAL_TABLET | Freq: Four times a day (QID) | ORAL | Status: DC | PRN

## 2022-07-12 MED ORDER — TRAZODONE HCL 50 MG PO TABS: 50.0000 mg | ORAL_TABLET | Freq: Every evening | ORAL | Status: DC | PRN

## 2022-07-12 MED ORDER — PANTOPRAZOLE 80MG IVPB - SIMPLE MED
80.0000 mg | Freq: Once | INTRAVENOUS | Status: AC
  Administered 2022-07-12: 80 mg via INTRAVENOUS
  Filled 2022-07-12: qty 80

## 2022-07-12 MED ORDER — ACETAMINOPHEN 650 MG RE SUPP: 650.0000 mg | Freq: Four times a day (QID) | RECTAL | Status: DC | PRN

## 2022-07-12 MED ORDER — PANTOPRAZOLE INFUSION (NEW) - SIMPLE MED
8.0000 mg/h | INTRAVENOUS | Status: DC
  Administered 2022-07-12 – 2022-07-13 (×4): 8 mg/h via INTRAVENOUS
  Filled 2022-07-12: qty 80
  Filled 2022-07-12 (×4): qty 100
  Filled 2022-07-12: qty 80

## 2022-07-12 MED ORDER — SODIUM CHLORIDE 0.9 % IV BOLUS
1000.0000 mL | Freq: Once | INTRAVENOUS | Status: AC
  Administered 2022-07-12: 1000 mL via INTRAVENOUS

## 2022-07-12 MED ORDER — ACETAMINOPHEN 325 MG PO TABS
650.0000 mg | ORAL_TABLET | Freq: Four times a day (QID) | ORAL | Status: DC | PRN
  Administered 2022-07-13: 650 mg via ORAL
  Filled 2022-07-12: qty 2

## 2022-07-12 MED ORDER — ONDANSETRON HCL 4 MG/2ML IJ SOLN
4.0000 mg | Freq: Once | INTRAMUSCULAR | Status: AC
  Administered 2022-07-12: 4 mg via INTRAVENOUS
  Filled 2022-07-12: qty 2

## 2022-07-12 NOTE — ED Triage Notes (Signed)
Dizziness and 2 episodes of vomiting blood. Pt described vomit as looking like "pinkish red jello". Pt had cardiac stent placed 2 months ago and has been having pain under left ribcage since, cardiology cleared pt and did not find a cause that was cardiac related. Pt still c/o pain.  Recent positive fecal occult a few weeks ago at PCP, has appt with GI next week

## 2022-07-12 NOTE — ED Provider Notes (Signed)
[place] EMERGENCY DEPARTMENT AT [facility] Provider Note   CSN: [id number] Arrival date & time: 07/12/22  1333     History  Chief Complaint  Patient presents with   Hemoptysis   Dizziness    Sara [name] is a 77 y.o. female.  Urgency department complaining of lightheadedness and 2 episodes of emesis.  Patient described her vomit earlier today as looking like pinkish-red Jell-O.  She initially complained of dizziness but on further clarification describes it as feeling lightheaded and like she may have a syncopal episode.  The patient had a cardiac stent placed in February and was placed on Plavix at that time.  She has had a GI bleed with plans for outpatient GI follow-up next week.  She has had continued chest pain on the left side which has been subsequently evaluated by cardiology and is felt to not be cardiac in nature.  This week she states she has had increased weakness/shortness of breath.  Upon arrival the patient is notably tachycardic with a heart rate of 120.  She denies abdominal pain and nausea at this time.  She has no further episodes of hematemesis.  Past medical history significant for GERD, CKD stage III, Barrett's esophagus, left-sided chest pain, hiatal hernia, dyspnea on exertion  HPI     Home Medications Prior to Admission medications   Medication Sig Start Date End Date Taking? Authorizing Provider  aspirin EC 81 MG tablet Take 1 tablet (81 mg total) by mouth daily. 01/15/22   Duke Salvia, MD  Cholecalciferol (QC VITAMIN D3) 50 MCG (2000 UT) CAPS Take 1 capsule by mouth daily.    [provider]  clopidogrel (PLAVIX) 75 MG tablet Take 1 tablet (75 mg total) by mouth daily. 04/29/22   Azalee Course, PA  COVID-19 mRNA vaccine [phone] (COMIRNATY) SUSP injection Inject into the muscle. 01/03/22   Judyann Munson, MD  cyanocobalamin (VITAMIN B12) 1000 MCG tablet Take 1,000 mcg by mouth daily.    [provider]  furosemide  (LASIX) 20 MG tablet Take 1 tablet by mouth daily for 3 days, then take as needed for fluid Patient taking differently: Take 20 mg by mouth daily as needed for edema. 03/11/22   Duke Salvia, MD  losartan (COZAAR) 25 MG tablet Take 1 tablet (25 mg total) by mouth daily. 06/24/22   Sharlene Dory, PA-C  methocarbamol (ROBAXIN) 500 MG tablet Take 1-2 tablets (500-1,000 mg total) by mouth every 6 to 8 hours as needed for muscle spasms/tension. 01/28/22     Multiple Vitamin (MULTIVITAMIN WITH MINERALS) TABS Take 1 tablet by mouth daily.    [provider]  nitroGLYCERIN (NITROSTAT) 0.4 MG SL tablet Place 1 tablet (0.4 mg total) under the tongue every 5 (five) minutes as needed for chest pain. 05/13/22 08/11/22  Carlos Levering, NP  Probiotic Product (PROBIOTIC DAILY PO) Take 1 capsule by mouth daily.    [provider]  rosuvastatin (CRESTOR) 5 MG tablet Take 1 tablet (5 mg total) by mouth daily. 02/04/22   Duke Salvia, MD  verapamil (CALAN-SR) 180 MG CR tablet Take 1 tablet (180 mg total) by mouth daily. 09/04/21   Duke Salvia, MD  Zoster Vaccine Adjuvanted [facility]) injection Inject into the muscle. 06/22/21         Allergies    Augmentin [amoxicillin-pot clavulanate], Metoprolol, and Nsaids    Review of Systems   Review of Systems  Physical Exam Updated Vital Signs BP 102/63 (BP Location:  Right Arm)   Pulse (!) 104   Temp 97.8 F (36.6 C) (Oral)   Resp (!) 25   Ht  (1.676 m)   Wt 74.4 kg   LMP  (LMP Unknown)   SpO2 97%   BMI 26.47 kg/m  Physical Exam Vitals and nursing note reviewed.  Constitutional:      General: She is not in acute distress.    Appearance: She is well-developed.  HENT:     Head: Normocephalic and atraumatic.     Mouth/Throat:     Mouth: Mucous membranes are moist.  Eyes:     Conjunctiva/sclera: Conjunctivae normal.  Cardiovascular:     Rate and Rhythm: Regular rhythm. Tachycardia present.     Heart sounds: No murmur  heard. Pulmonary:     Effort: Pulmonary effort is normal. No respiratory distress.     Breath sounds: Normal breath sounds.  Abdominal:     Palpations: Abdomen is soft.     Tenderness: There is no abdominal tenderness.  Musculoskeletal:        General: No swelling or tenderness.     Cervical back: Neck supple.     Comments: No tenderness to palpation of the chest  Skin:    General: Skin is warm and dry.     Capillary Refill: Capillary refill takes less than 2 seconds.  Neurological:     Mental Status: She is alert.  Psychiatric:        Mood and Affect: Mood normal.     ED Results / Procedures / Treatments   Labs (all labs ordered are listed, but only abnormal results are displayed) Labs Reviewed  BASIC METABOLIC PANEL - Abnormal; Notable for the following components:      Result Value   Potassium 5.2 (*)    Glucose, Bld 141 (*)    BUN 44 (*)    Creatinine, Ser 1.75 (*)    Calcium 8.6 (*)    GFR, Estimated 30 (*)    All other components within normal limits  CBC - Abnormal; Notable for the following components:   WBC 11.4 (*)    RBC 3.62 (*)    Hemoglobin 11.2 (*)    HCT 34.7 (*)    All other components within normal limits  CBG MONITORING, ED - Abnormal; Notable for the following components:   Glucose-Capillary 135 (*)    All other components within normal limits  URINALYSIS, ROUTINE W REFLEX MICROSCOPIC  CBC WITH DIFFERENTIAL/PLATELET  TYPE AND SCREEN  TROPONIN I (HIGH SENSITIVITY)    EKG None  Radiology No results found.  Procedures Procedures    Medications Ordered in ED Medications  sodium chloride 0.9 % bolus 1,000 mL (has no administration in time range)    ED Course/ Medical Decision Making/ A&P                             Medical Decision Making Amount and/or Complexity of Data Reviewed Labs: ordered. Radiology: ordered.   This patient presents to the ED for concern of hematemesis, chest pain, this involves an extensive number of  treatment options, and is a complaint that carries with it a high risk of complications and morbidity.  The differential diagnosis includes PE, ACS, ABLA, GI bleed, and others   Co morbidities that complicate the patient evaluation  Current GI bleed, plavix usage   Additional history obtained:   External records from outside source obtained and reviewed including cardiology  notes from 06/24/22    Lab Tests:  I Ordered, and personally interpreted labs.  The pertinent results include:  HgB 11.2, K+ 5.2, creatinine 1.75 (baseline around 1)   Imaging Studies ordered:  CT angio chest PE study ordered   Cardiac Monitoring: / EKG:  The patient was maintained on a cardiac monitor.  I personally viewed and interpreted the cardiac monitored which showed an underlying rhythm of: Sinus tachycardia   Problem List / ED Course / Critical interventions / Medication management   I ordered medication including normal saline bolus for fluid resuscitation Reevaluation of the patient after these medicines showed that the patient stayed the same I have reviewed the patients home medicines and have made adjustments as needed   Test / Admission - Considered:  Patient care being transferred to Lurena Nida, PA-C at shift handoff. Patient currently appears to have mild AKI, possible dehydration. Final disposition pending further lab work, imaging, and reassessment.          Final Clinical Impression(s) / ED Diagnoses Final diagnoses:  None    Rx / DC Orders ED Discharge Orders     None         Pamala Duffel 07/12/22 1514    Lorre Nick, MD 07/15/22 1625

## 2022-07-12 NOTE — H&P (View-Only) (Signed)
Consultation  Referring Provider:   Dr. Anitra Lauth, ED Primary Care Physician:  [name], Buren Kos, MD Primary Gastroenterologist:  Dr. Christella Hartigan       Reason for Consultation:     Hematemesis         HPI:   Glendene Wyer is a 77 y.o. female with past medical history significant for hypertension, hyperlipidemia, CKD stage III, CAD status post DES stent to mid RCA on 04/29/22 on clopidogrel, hiatal hernia status post fundoplication, gastritis, esophagitis, Barrett's presents with chest pain and hematemesis.  01/2018 colonoscopy Dr. Christella Hartigan 15 mm polyp cecum 3 mm polyp descending colon diverticula 12/10/2019 endoscopy for indigestion, remote fundoplication for large HH, Barrett's sowed significantly anatomical abnormal proximal stomach metaplasia is intact however the diaphragm is a medium size hiatal hernia free reflux of gastric secretion in the esophagus no retained solid or liquid food in the stomach nonspecific mild distal gastritis esophagus torturous GE junction single erosion, Barrett's appearing mucosa up to 25 cm mild reflux related esophagitis Pathology showed Barrett's changes without dysplasia recall 3 years.  Presents to the ER with lightheadedness/presyncopal episode and 2 episodes of emesis.  First episode was pinkish-red Jell-O looking in color. Had upcoming outpatient GI follow-up 4/24 with Dr. Chales Abrahams for FOBT positive stool on 06/13/2022 Patient troponin negative, EKG unremarkable. WBC 11.4, Hgb 11.2 compared to a month ago was 14.2, normal platelets BUN 44 compared to 2 months ago was 14, creatinine 1.75 compared to 1.01 on 2 months ago. Hyperkalemia 5.2  Patient with family at bedside, Daughter [name]. Provided some of the history.  Patient states she was having shortness of breath, had PET scan and then catheterization where she had DES to RCA and started on Plavix.  States 1 to 2 weeks after catheterization and started Plavix started have left upper quadrant  abdominal discomfort crampy had darker stools.  Went to primary care and had FOBT positive stool 3/21 and subsequent outpatient follow-up appointment set 4/24 with Dr. Chales Abrahams. Then today patient was going to store had presyncopal episode had emesis small-volume jellylike red blood, and then 450 cc large-volume dark maroon emesis in the ER. Patient denies any shortness of breath or chest pain. Continues to have the left upper quadrant abdominal discomfort. Has had some indigestion and GERD last couple days, dysphagia this morning to pills only otherwise unremarkable. Continues to have some nausea.  Denies NSAIDs, alcohol. Has been on Protonix 40 mg once daily. Last dose of Plavix was this morning.  Abnormal ED labs: Abnormal Labs Reviewed  BASIC METABOLIC PANEL - Abnormal; Notable for the following components:      Result Value   Potassium 5.2 (*)    Glucose, Bld 141 (*)    BUN 44 (*)    Creatinine, Ser 1.75 (*)    Calcium 8.6 (*)    GFR, Estimated 30 (*)    All other components within normal limits  CBC - Abnormal; Notable for the following components:   WBC 11.4 (*)    RBC 3.62 (*)    Hemoglobin 11.2 (*)    HCT 34.7 (*)    All other components within normal limits  CBG MONITORING, ED - Abnormal; Notable for the following components:   Glucose-Capillary 135 (*)    All other components within normal limits     Past Medical History:  Diagnosis Date   Anxiety 07/2020   Aortic dilatation 03/25/2022   38 mm noted on echo   Arthritis 11/2017  Barrett's esophagus    CAD (coronary artery disease)    Cataract    had surgery   Chronic kidney disease, stage 3a    GERD (gastroesophageal reflux disease)    hx of    HIATAL HERNIA    s/p surgical repair   Hyperlipidemia    HYPERTENSION    Kidney stone    1990s   LVH (left ventricular hypertrophy)    Osteoporosis    PAT (paroxysmal atrial tachycardia)    Premature atrial contractions    Wellness examination 08/22/2021     Surgical History:  She  has a past surgical history that includes Tonsillectomy (1968); Cataract extraction (2009); Lithotripsy; Vaginal hysterectomy (N/A, 06/12/2012); Cystocele repair (N/A, 06/12/2012); Hiatal hernia repair (2011); Cholecystectomy (11/02/2012); Upper gi endoscopy (11/02/2012); Upper gastrointestinal endoscopy; Eye surgery (Cataract removal 2009); Hernia repair (2011); Abdominal hysterectomy (06/12/2012); Colostomy; RIGHT/LEFT HEART CATH AND CORONARY ANGIOGRAPHY (N/A, 04/29/2022); CORONARY STENT INTERVENTION (N/A, 04/29/2022); and Coronary Ultrasound/IVUS (N/A, 04/29/2022). Family History:  Her family history includes Asthma in her maternal grandmother; Breast cancer in her maternal grandmother and another family member; Breast cancer (age of onset: 54) in her mother; Cancer in her maternal grandmother, mother, paternal aunt, and paternal uncle; Colon cancer in her paternal aunt; Early death in her father and paternal uncle; Heart disease in her father and another family member; Hypertension in her mother and another family member; Kidney disease in her father. Social History:   reports that she has never smoked. She has been exposed to tobacco smoke. She has never used smokeless tobacco. She reports that she does not drink alcohol and does not use drugs.  Prior to Admission medications   Medication Sig Start Date End Date Taking? Authorizing Provider  aspirin EC 81 MG tablet Take 1 tablet (81 mg total) by mouth daily. 01/15/22   Duke Salvia, MD  Cholecalciferol (QC VITAMIN D3) 50 MCG (2000 UT) CAPS Take 1 capsule by mouth daily.    [provider]  clopidogrel (PLAVIX) 75 MG tablet Take 1 tablet (75 mg total) by mouth daily. 04/29/22   Azalee Course, PA  COVID-19 mRNA vaccine [phone] (COMIRNATY) SUSP injection Inject into the muscle. 01/03/22   Judyann Munson, MD  cyanocobalamin (VITAMIN B12) 1000 MCG tablet Take 1,000 mcg by mouth daily.    [provider]   furosemide (LASIX) 20 MG tablet Take 1 tablet by mouth daily for 3 days, then take as needed for fluid Patient taking differently: Take 20 mg by mouth daily as needed for edema. 03/11/22   Duke Salvia, MD  losartan (COZAAR) 25 MG tablet Take 1 tablet (25 mg total) by mouth daily. 06/24/22   Sharlene Dory, PA-C  methocarbamol (ROBAXIN) 500 MG tablet Take 1-2 tablets (500-1,000 mg total) by mouth every 6 to 8 hours as needed for muscle spasms/tension. 01/28/22     Multiple Vitamin (MULTIVITAMIN WITH MINERALS) TABS Take 1 tablet by mouth daily.    [provider]  nitroGLYCERIN (NITROSTAT) 0.4 MG SL tablet Place 1 tablet (0.4 mg total) under the tongue every 5 (five) minutes as needed for chest pain. 05/13/22 08/11/22  Carlos Levering, NP  Probiotic Product (PROBIOTIC DAILY PO) Take 1 capsule by mouth daily.    [provider]  rosuvastatin (CRESTOR) 5 MG tablet Take 1 tablet (5 mg total) by mouth daily. 02/04/22   Duke Salvia, MD  verapamil (CALAN-SR) 180 MG CR tablet Take 1 tablet (180 mg total) by mouth daily. 09/04/21   Graciela Husbands,  Salvatore Decent, MD  Zoster Vaccine Adjuvanted [facility]) injection Inject into the muscle. 06/22/21       Current Facility-Administered Medications  Medication Dose Route Frequency Provider Last Rate Last Admin   pantoprazole (PROTONIX) injection 40 mg  40 mg Intravenous Q12H Doree Albee, PA-C       Current Outpatient Medications  Medication Sig Dispense Refill   aspirin EC 81 MG tablet Take 1 tablet (81 mg total) by mouth daily. 30 tablet 12   Cholecalciferol (QC VITAMIN D3) 50 MCG (2000 UT) CAPS Take 1 capsule by mouth daily.     clopidogrel (PLAVIX) 75 MG tablet Take 1 tablet (75 mg total) by mouth daily. 30 tablet 11   COVID-19 mRNA vaccine 2023-2024 (COMIRNATY) SUSP injection Inject into the muscle. 0.3 mL 0   cyanocobalamin (VITAMIN B12) 1000 MCG tablet Take 1,000 mcg by mouth daily.     furosemide (LASIX) 20 MG tablet Take 1 tablet by  mouth daily for 3 days, then take as needed for fluid (Patient taking differently: Take 20 mg by mouth daily as needed for edema.) 30 tablet 3   losartan (COZAAR) 25 MG tablet Take 1 tablet (25 mg total) by mouth daily. 90 tablet 3   methocarbamol (ROBAXIN) 500 MG tablet Take 1-2 tablets (500-1,000 mg total) by mouth every 6 to 8 hours as needed for muscle spasms/tension. 60 tablet 2   Multiple Vitamin (MULTIVITAMIN WITH MINERALS) TABS Take 1 tablet by mouth daily.     nitroGLYCERIN (NITROSTAT) 0.4 MG SL tablet Place 1 tablet (0.4 mg total) under the tongue every 5 (five) minutes as needed for chest pain. 25 tablet 3   Probiotic Product (PROBIOTIC DAILY PO) Take 1 capsule by mouth daily.     rosuvastatin (CRESTOR) 5 MG tablet Take 1 tablet (5 mg total) by mouth daily. 90 tablet 3   verapamil (CALAN-SR) 180 MG CR tablet Take 1 tablet (180 mg total) by mouth daily. 90 tablet 3   Zoster Vaccine Adjuvanted [facility]) injection Inject into the muscle. 0.5 mL 1    Allergies as of 07/12/2022 - Reviewed 07/12/2022  Allergen Reaction Noted   Augmentin [amoxicillin-pot clavulanate] Diarrhea 01/29/2018   Metoprolol  02/27/2022   Nsaids  04/24/2022    Review of Systems:    Review of Systems  Constitutional:  Positive for malaise/fatigue. Negative for chills, fever and weight loss.  HENT: Negative.    Eyes: Negative.   Respiratory: Negative.  Negative for shortness of breath.   Cardiovascular: Negative.  Negative for chest pain and leg swelling.  Gastrointestinal:  Positive for abdominal pain, heartburn, nausea and vomiting. Negative for blood in stool, constipation and diarrhea. Melena: darker stool. Genitourinary: Negative.   Musculoskeletal: Negative.   Skin: Negative.   Neurological:  Positive for dizziness (presyncopal episode).      Physical Exam:  Vital signs in last 24 hours: Temp:  [97.8 F (36.6 C)-97.9 F (36.6 C)] 97.8 F (36.6 C) (04/19 1419) Pulse Rate:  [98-117] 98 (04/19  1545) Resp:  [17-25] 22 (04/19 1530) BP: (102-123)/(61-80) 123/62 (04/19 1545) SpO2:  [97 %-100 %] 100 % (04/19 1545) Weight:  [74.4 kg] 74.4 kg (04/19 1348)   Last BM recorded by nurses in past 5 days No data recorded  General:   Pleasant, well developed female in no acute distress Head:  Normocephalic and atraumatic. Eyes: sclerae anicteric,conjunctive pink  Heart:  regular rate and rhythm, no murmurs or gallops Pulm: Clear anteriorly; no wheezing Abdomen:  Soft, Non-distended AB, Active  bowel sounds. mild tenderness in the epigastrium. Without guarding and Without rebound, No organomegaly appreciated. Extremities:  Without edema. Msk:  Symmetrical without gross deformities. Peripheral pulses intact.  Neurologic:  Alert and  oriented x4;  No focal deficits.  Skin:   Dry and intact without significant lesions or rashes. Psychiatric:  Cooperative. Normal mood and affect.  LAB RESULTS: Recent Labs    07/12/22 1348  WBC 11.4*  HGB 11.2*  HCT 34.7*  PLT 270   BMET Recent Labs    07/12/22 1348  NA 138  K 5.2*  CL 106  CO2 23  GLUCOSE 141*  BUN 44*  CREATININE 1.75*  CALCIUM 8.6*   LFT No results for input(s): "PROT", "ALBUMIN", "AST", "ALT", "ALKPHOS", "BILITOT", "BILIDIR", "IBILI" in the last 72 hours. PT/INR No results for input(s): "LABPROT", "INR" in the last 72 hours.  STUDIES: No results found.    Impression    Hematemesis without hemodynamic compromise, FOBT positive In the setting of Plavix started 2/24 for DES to RCA History of Barrett's esophagus, gastritis/esophagitis, previous fundoplication for hiatal hernia with reoccurrence despite fundoplication being intact, last EGD 2021 recall this year 3 g drop in hemoglobin, elevation of BUN and creatinine HGB 11.2 MCV 95.9 Platelets 270 BUN 44 Cr 1.75  Hyperkalemia Repeat potassium in the morning Likely secondary to hemoconcentration  CAD status post DES to RCA on 04/29/22 Troponin negative, EKG  without changes  AKI on CKD Possible dehydration versus secondary to upper GI blood loss  Barrett's esophagus Due for repeat endoscopy this year  History of adenomatous polyp Last colonoscopy 02/2018 15 mm adenomatous polyp removed recall 3 years  Active Problems:   * No active hospital problems. *    LOS: 0 days     Plan   77 year old female with history of Barrett's esophagus, hiatal hernia with previous fundoplication and reoccurrence of hernia, EGD 2021 recall this year, CAD status post DES stent 2/5, last dose of Plavix this morning presents to the ER with presyncopal episode and hematemesis 450 cc maroon-colored blood with left upper quadrant abdominal discomfort. Likely this represents esophagitis, gastritis, peptic ulcer disease, Cameron's lesion, worsened with Plavix use.  -Hold Plavix, pending endoscopy can discuss with cardiology about reinitiation or cessation -With active bleeding will set up for EGD tomorrow with Dr. Elnoria Howard if patient's potassium allows, currently at 5.1, will recheck in the morning but likely this is more hemoconcentration.  discussed risks of EGD with patient today, including risk of sedation, bleeding or perforation.  Patient provides understanding and gave verbal consent to proceed. -IV Protonix Bolus with subsequent BID dosing -Clear liquids tonight, NPO at midnight -Trend Hgb/Hct Q6H for 4 times.  -Continue volume resuscitation as able with Hgb goal >8 -Patient will require likely outpatient colonoscopy for FOBT positive as patient is overdue.  Thank you for your kind consultation, we will continue to follow.   Doree Albee  07/12/2022, 4:40 PM   GI ATTENDING  History, labs, x-rays reviewed. Agree with comprehensive evaluation as outlined above.  9 F with multiple medical problems CAD with DES 04/2022 on Plavix. Has significant hematemesis in ER. 3 gm decrease in Hg from baseline of 14, with increase Bun/Cr. VSS.   Being admitted, IV  PPI, close monitoring and supportive measures as needed. Needs EGD tomorrow (or sooner if she decompensates) as she needs to be on Plavix with recent DES. Dr. Elnoria Howard to perform and is aware of this patient currently. She is high risk given her co-morbidities,  and the need for Plavix due to recent DES.  Wilhemina Bonito. Eda Keys., M.D. [facility] Division of Gastroenterology

## 2022-07-12 NOTE — ED Notes (Signed)
This RN has called over to CT to determine the status on getting the patient over to have her CT done; was told they were waiting on labs and would be coming to get her "soon."

## 2022-07-12 NOTE — ED Notes (Addendum)
The patient had an episode of large amount of dark red thick jello like vomit, 450 mls. Maralyn Sago, PA informed via epic secure chat.

## 2022-07-12 NOTE — ED Provider Notes (Signed)
Care assumed from Barrie Dunker, PA-C at shift change. Please see their note for further information.  Briefly: patient with history of cardiac stent placement on 04/29/22 on Plavix presents today with complaints of shortness of breath, left lower chest pain, and hematemesis. Also with near syncope and lightheadedness. 1 episode of hematemesis this morning, none since then. No hx of same, no liver failure, alcohol use, or NSAID use. Cardiac work-up reassuring. CTA pending to r/o PE. If normal, patient can likely go home with GI follow-up at her appointment next week. She denies hematochezia or melena.  Physical Exam  BP 123/62   Pulse 98   Temp 97.8 F (36.6 C) (Oral)   Resp (!) 22   Ht  (1.676 m)   Wt 74.4 kg   LMP  (LMP Unknown)   SpO2 100%   BMI 26.47 kg/m   Physical Exam Vitals and nursing note reviewed.  Constitutional:      General: She is not in acute distress.    Appearance: Normal appearance. She is normal weight. She is not ill-appearing, toxic-appearing or diaphoretic.  HENT:     Head: Normocephalic and atraumatic.  Cardiovascular:     Rate and Rhythm: Normal rate and regular rhythm.     Heart sounds: Normal heart sounds.  Pulmonary:     Effort: Pulmonary effort is normal. No respiratory distress.     Breath sounds: Normal breath sounds.  Abdominal:     General: Abdomen is flat.     Palpations: Abdomen is soft.     Comments: LUQ TTP  Musculoskeletal:        General: Normal range of motion.     Cervical back: Normal range of motion.  Skin:    General: Skin is warm and dry.  Neurological:     General: No focal deficit present.     Mental Status: She is alert.  Psychiatric:        Mood and Affect: Mood normal.        Behavior: Behavior normal.     Procedures  .Critical Care  Performed by: Silva Bandy, PA-C Authorized by: Silva Bandy, PA-C   Critical care provider statement:    Critical care time (minutes):  30   Critical care start time:   07/12/2022 4:30 PM   Critical care end time:  07/12/2022 5:00 PM   Critical care was necessary to treat or prevent imminent or life-threatening deterioration of the following conditions:  Circulatory failure (Upper GI bleed with frank hemoptysis)   Critical care was time spent personally by me on the following activities:  Development of treatment plan with patient or surrogate, discussions with consultants, discussions with primary provider, evaluation of patient's response to treatment, examination of patient, obtaining history from patient or surrogate, ordering and review of laboratory studies, ordering and review of radiographic studies, pulse oximetry and re-evaluation of patient's condition   Care discussed with: admitting provider     ED Course / MDM    Medical Decision Making Amount and/or Complexity of Data Reviewed Labs: ordered. Radiology: ordered.  Risk Prescription drug management. Decision regarding hospitalization.   This patient is a 77 y.o. female who presents to the ED for concern of hematemesis, this involves an extensive number of treatment options, and is a complaint that carries with it a high risk of complications and morbidity. The emergent differential diagnosis prior to evaluation includes, but is not limited to,  PE/ACS, Upper GI bleed, Barrett's esophagus, Mallory Weiss tear .  This is not an exhaustive differential.   Past Medical History / Co-morbidities / Social History: Recent cardiac stent on 2/05 on Plavix  Physical Exam: Physical exam performed. The pertinent findings include: well appearing, 450 ml of frank hematemesis sitting in a suction canister at bedside.  Lab Tests: I ordered, and personally interpreted labs.  The pertinent results include:  WBC 11.4, hgb 11.2 down from 14.2 1 month ago. First troponin 3.   Imaging Studies: I ordered imaging studies including CTA PE. I independently visualized and interpreted imaging which showed   Large hiatal  hernia. There is a dilated fluid-filled esophagus. Submucosal hyperenhancement as well along the lower esophagus. Further workup when clinically appropriate.   Significant breathing motion significantly limiting evaluation for pulmonary emboli. No large or central embolus.   Trace pericardial fluid.  I agree with the radiologist interpretation.   Cardiac Monitoring:  The patient was maintained on a cardiac monitor.  Cardiac monitor showed an underlying rhythm of: sinus tachycardia. I agree with this interpretation.   Medications: I ordered medication including zofran, fluids, protonix  for nausea, dehydration, and GI bleed. Reevaluation of the patient after these medicines showed that the patient improved. I have reviewed the patients home medicines and have made adjustments as needed.  Consultations Obtained: I requested consultation with the GI on call PA Quentin Mulling,  and discussed lab and imaging findings as well as pertinent plan - they recommend: admit to medicine, they will see the patient   Disposition:  Patient initially presents today with left lower chest pain with hematemesis and shortness of breath with lightheadedness. Hx cardiac stent on Plavix. Initial work-up concerning for cardiac etiology of symptoms which was reassuring. However, at approximately 4pm patient had episode of 450 mls of hematemesis. This is likely cause of her symptoms. No hx of NSAID use, alcohol abuse, or liver disease. No indication for administration of octreotide. She is hemodynamically stable. Given protonix and NG tube placed. She will require admission. She is understanding and amenable with plan.  Discussed patient with hospitalist who agrees to admit.   Findings and plan of care discussed with supervising physician Dr. Anitra Lauth who is in agreement.      Vear Clock 07/12/22 1722    Gwyneth Sprout, MD 07/17/22 0002

## 2022-07-12 NOTE — Assessment & Plan Note (Signed)
Hypertension-hold verapamil in setting of low normal blood pressures.  Hold losartan in setting of AKI. CAD-hold aspirin and Plavix HLD-hold rosuvastatin

## 2022-07-12 NOTE — Assessment & Plan Note (Signed)
Likely secondary to combination of acute blood loss anemia and poor p.o. intake in setting of acute illness.  Status post 1 L bolus in ED, will be on maintenance fluids overnight, trend creatinine.

## 2022-07-12 NOTE — ED Notes (Signed)
Patient transported to CT 

## 2022-07-12 NOTE — Consult Note (Addendum)
Consultation  Referring Provider:   Dr. Anitra Lauth, ED Primary Care Physician:  [name], Buren Kos, MD Primary Gastroenterologist:  Dr. Christella Hartigan       Reason for Consultation:     Hematemesis         HPI:   Sara [name] is a 77 y.o. female with past medical history significant for hypertension, hyperlipidemia, CKD stage III, CAD status post DES stent to mid RCA on 04/29/22 on clopidogrel, hiatal hernia status post fundoplication, gastritis, esophagitis, Barrett's presents with chest pain and hematemesis.  01/2018 colonoscopy Dr. Christella Hartigan 15 mm polyp cecum 3 mm polyp descending colon diverticula 12/10/2019 endoscopy for indigestion, remote fundoplication for large HH, Barrett's sowed significantly anatomical abnormal proximal stomach metaplasia is intact however the diaphragm is a medium size hiatal hernia free reflux of gastric secretion in the esophagus no retained solid or liquid food in the stomach nonspecific mild distal gastritis esophagus torturous GE junction single erosion, Barrett's appearing mucosa up to 25 cm mild reflux related esophagitis Pathology showed Barrett's changes without dysplasia recall 3 years.  Presents to the ER with lightheadedness/presyncopal episode and 2 episodes of emesis.  First episode was pinkish-red Jell-O looking in color. Had upcoming outpatient GI follow-up 4/24 with Dr. Chales Abrahams for FOBT positive stool on 06/13/2022 Patient troponin negative, EKG unremarkable. WBC 11.4, Hgb 11.2 compared to a month ago was 14.2, normal platelets BUN 44 compared to 2 months ago was 14, creatinine 1.75 compared to 1.01 on 2 months ago. Hyperkalemia 5.2  Patient with family at bedside, Daughter Sara [name]. Provided some of the history.  Patient states she was having shortness of breath, had PET scan and then catheterization where she had DES to RCA and started on Plavix.  States 1 to 2 weeks after catheterization and started Plavix started have left upper quadrant  abdominal discomfort crampy had darker stools.  Went to primary care and had FOBT positive stool 3/21 and subsequent outpatient follow-up appointment set 4/24 with Dr. Chales Abrahams. Then today patient was going to store had presyncopal episode had emesis small-volume jellylike red blood, and then 450 cc large-volume dark maroon emesis in the ER. Patient denies any shortness of breath or chest pain. Continues to have the left upper quadrant abdominal discomfort. Has had some indigestion and GERD last couple days, dysphagia this morning to pills only otherwise unremarkable. Continues to have some nausea.  Denies NSAIDs, alcohol. Has been on Protonix 40 mg once daily. Last dose of Plavix was this morning.  Abnormal ED labs: Abnormal Labs Reviewed  BASIC METABOLIC PANEL - Abnormal; Notable for the following components:      Result Value   Potassium 5.2 (*)    Glucose, Bld 141 (*)    BUN 44 (*)    Creatinine, Ser 1.75 (*)    Calcium 8.6 (*)    GFR, Estimated 30 (*)    All other components within normal limits  CBC - Abnormal; Notable for the following components:   WBC 11.4 (*)    RBC 3.62 (*)    Hemoglobin 11.2 (*)    HCT 34.7 (*)    All other components within normal limits  CBG MONITORING, ED - Abnormal; Notable for the following components:   Glucose-Capillary 135 (*)    All other components within normal limits     Past Medical History:  Diagnosis Date   Anxiety 07/2020   Aortic dilatation 03/25/2022   38 mm noted on echo   Arthritis 11/2017  Barrett's esophagus    CAD (coronary artery disease)    Cataract    had surgery   Chronic kidney disease, stage 3a    GERD (gastroesophageal reflux disease)    hx of    HIATAL HERNIA    s/p surgical repair   Hyperlipidemia    HYPERTENSION    Kidney stone    1990s   LVH (left ventricular hypertrophy)    Osteoporosis    PAT (paroxysmal atrial tachycardia)    Premature atrial contractions    Wellness examination 08/22/2021     Surgical History:  She  has a past surgical history that includes Tonsillectomy (1968); Cataract extraction (2009); Lithotripsy; Vaginal hysterectomy (N/A, 06/12/2012); Cystocele repair (N/A, 06/12/2012); Hiatal hernia repair (2011); Cholecystectomy (11/02/2012); Upper gi endoscopy (11/02/2012); Upper gastrointestinal endoscopy; Eye surgery (Cataract removal 2009); Hernia repair (2011); Abdominal hysterectomy (06/12/2012); Colostomy; RIGHT/LEFT HEART CATH AND CORONARY ANGIOGRAPHY (N/A, 04/29/2022); CORONARY STENT INTERVENTION (N/A, 04/29/2022); and Coronary Ultrasound/IVUS (N/A, 04/29/2022). Family History:  Her family history includes Asthma in her maternal grandmother; Breast cancer in her maternal grandmother and another family member; Breast cancer (age of onset: 54) in her mother; Cancer in her maternal grandmother, mother, paternal aunt, and paternal uncle; Colon cancer in her paternal aunt; Early death in her father and paternal uncle; Heart disease in her father and another family member; Hypertension in her mother and another family member; Kidney disease in her father. Social History:   reports that she has never smoked. She has been exposed to tobacco smoke. She has never used smokeless tobacco. She reports that she does not drink alcohol and does not use drugs.  Prior to Admission medications   Medication Sig Start Date End Date Taking? Authorizing Provider  aspirin EC 81 MG tablet Take 1 tablet (81 mg total) by mouth daily. 01/15/22   Duke Salvia, MD  Cholecalciferol (QC VITAMIN D3) 50 MCG (2000 UT) CAPS Take 1 capsule by mouth daily.    [provider]  clopidogrel (PLAVIX) 75 MG tablet Take 1 tablet (75 mg total) by mouth daily. 04/29/22   Azalee Course, PA  COVID-19 mRNA vaccine [phone] (COMIRNATY) SUSP injection Inject into the muscle. 01/03/22   Judyann Munson, MD  cyanocobalamin (VITAMIN B12) 1000 MCG tablet Take 1,000 mcg by mouth daily.    [provider]   furosemide (LASIX) 20 MG tablet Take 1 tablet by mouth daily for 3 days, then take as needed for fluid Patient taking differently: Take 20 mg by mouth daily as needed for edema. 03/11/22   Duke Salvia, MD  losartan (COZAAR) 25 MG tablet Take 1 tablet (25 mg total) by mouth daily. 06/24/22   Sharlene Dory, PA-C  methocarbamol (ROBAXIN) 500 MG tablet Take 1-2 tablets (500-1,000 mg total) by mouth every 6 to 8 hours as needed for muscle spasms/tension. 01/28/22     Multiple Vitamin (MULTIVITAMIN WITH MINERALS) TABS Take 1 tablet by mouth daily.    [provider]  nitroGLYCERIN (NITROSTAT) 0.4 MG SL tablet Place 1 tablet (0.4 mg total) under the tongue every 5 (five) minutes as needed for chest pain. 05/13/22 08/11/22  Carlos Levering, NP  Probiotic Product (PROBIOTIC DAILY PO) Take 1 capsule by mouth daily.    [provider]  rosuvastatin (CRESTOR) 5 MG tablet Take 1 tablet (5 mg total) by mouth daily. 02/04/22   Duke Salvia, MD  verapamil (CALAN-SR) 180 MG CR tablet Take 1 tablet (180 mg total) by mouth daily. 09/04/21   Graciela Husbands,  Salvatore Decent, MD  Zoster Vaccine Adjuvanted [facility]) injection Inject into the muscle. 06/22/21       Current Facility-Administered Medications  Medication Dose Route Frequency Provider Last Rate Last Admin   pantoprazole (PROTONIX) injection 40 mg  40 mg Intravenous Q12H Doree Albee, PA-C       Current Outpatient Medications  Medication Sig Dispense Refill   aspirin EC 81 MG tablet Take 1 tablet (81 mg total) by mouth daily. 30 tablet 12   Cholecalciferol (QC VITAMIN D3) 50 MCG (2000 UT) CAPS Take 1 capsule by mouth daily.     clopidogrel (PLAVIX) 75 MG tablet Take 1 tablet (75 mg total) by mouth daily. 30 tablet 11   COVID-19 mRNA vaccine 2023-2024 (COMIRNATY) SUSP injection Inject into the muscle. 0.3 mL 0   cyanocobalamin (VITAMIN B12) 1000 MCG tablet Take 1,000 mcg by mouth daily.     furosemide (LASIX) 20 MG tablet Take 1 tablet by  mouth daily for 3 days, then take as needed for fluid (Patient taking differently: Take 20 mg by mouth daily as needed for edema.) 30 tablet 3   losartan (COZAAR) 25 MG tablet Take 1 tablet (25 mg total) by mouth daily. 90 tablet 3   methocarbamol (ROBAXIN) 500 MG tablet Take 1-2 tablets (500-1,000 mg total) by mouth every 6 to 8 hours as needed for muscle spasms/tension. 60 tablet 2   Multiple Vitamin (MULTIVITAMIN WITH MINERALS) TABS Take 1 tablet by mouth daily.     nitroGLYCERIN (NITROSTAT) 0.4 MG SL tablet Place 1 tablet (0.4 mg total) under the tongue every 5 (five) minutes as needed for chest pain. 25 tablet 3   Probiotic Product (PROBIOTIC DAILY PO) Take 1 capsule by mouth daily.     rosuvastatin (CRESTOR) 5 MG tablet Take 1 tablet (5 mg total) by mouth daily. 90 tablet 3   verapamil (CALAN-SR) 180 MG CR tablet Take 1 tablet (180 mg total) by mouth daily. 90 tablet 3   Zoster Vaccine Adjuvanted [facility]) injection Inject into the muscle. 0.5 mL 1    Allergies as of 07/12/2022 - Reviewed 07/12/2022  Allergen Reaction Noted   Augmentin [amoxicillin-pot clavulanate] Diarrhea 01/29/2018   Metoprolol  02/27/2022   Nsaids  04/24/2022    Review of Systems:    Review of Systems  Constitutional:  Positive for malaise/fatigue. Negative for chills, fever and weight loss.  HENT: Negative.    Eyes: Negative.   Respiratory: Negative.  Negative for shortness of breath.   Cardiovascular: Negative.  Negative for chest pain and leg swelling.  Gastrointestinal:  Positive for abdominal pain, heartburn, nausea and vomiting. Negative for blood in stool, constipation and diarrhea. Melena: darker stool. Genitourinary: Negative.   Musculoskeletal: Negative.   Skin: Negative.   Neurological:  Positive for dizziness (presyncopal episode).      Physical Exam:  Vital signs in last 24 hours: Temp:  [97.8 F (36.6 C)-97.9 F (36.6 C)] 97.8 F (36.6 C) (04/19 1419) Pulse Rate:  [98-117] 98 (04/19  1545) Resp:  [17-25] 22 (04/19 1530) BP: (102-123)/(61-80) 123/62 (04/19 1545) SpO2:  [97 %-100 %] 100 % (04/19 1545) Weight:  [74.4 kg] 74.4 kg (04/19 1348)   Last BM recorded by nurses in past 5 days No data recorded  General:   Pleasant, well developed female in no acute distress Head:  Normocephalic and atraumatic. Eyes: sclerae anicteric,conjunctive pink  Heart:  regular rate and rhythm, no murmurs or gallops Pulm: Clear anteriorly; no wheezing Abdomen:  Soft, Non-distended AB, Active  bowel sounds. mild tenderness in the epigastrium. Without guarding and Without rebound, No organomegaly appreciated. Extremities:  Without edema. Msk:  Symmetrical without gross deformities. Peripheral pulses intact.  Neurologic:  Alert and  oriented x4;  No focal deficits.  Skin:   Dry and intact without significant lesions or rashes. Psychiatric:  Cooperative. Normal mood and affect.  LAB RESULTS: Recent Labs    07/12/22 1348  WBC 11.4*  HGB 11.2*  HCT 34.7*  PLT 270   BMET Recent Labs    07/12/22 1348  NA 138  K 5.2*  CL 106  CO2 23  GLUCOSE 141*  BUN 44*  CREATININE 1.75*  CALCIUM 8.6*   LFT No results for input(s): "PROT", "ALBUMIN", "AST", "ALT", "ALKPHOS", "BILITOT", "BILIDIR", "IBILI" in the last 72 hours. PT/INR No results for input(s): "LABPROT", "INR" in the last 72 hours.  STUDIES: No results found.    Impression    Hematemesis without hemodynamic compromise, FOBT positive In the setting of Plavix started 2/24 for DES to RCA History of Barrett's esophagus, gastritis/esophagitis, previous fundoplication for hiatal hernia with reoccurrence despite fundoplication being intact, last EGD 2021 recall this year 3 g drop in hemoglobin, elevation of BUN and creatinine HGB 11.2 MCV 95.9 Platelets 270 BUN 44 Cr 1.75  Hyperkalemia Repeat potassium in the morning Likely secondary to hemoconcentration  CAD status post DES to RCA on 04/29/22 Troponin negative, EKG  without changes  AKI on CKD Possible dehydration versus secondary to upper GI blood loss  Barrett's esophagus Due for repeat endoscopy this year  History of adenomatous polyp Last colonoscopy 02/2018 15 mm adenomatous polyp removed recall 3 years  Active Problems:   * No active hospital problems. *    LOS: 0 days     Plan   77 year old female with history of Barrett's esophagus, hiatal hernia with previous fundoplication and reoccurrence of hernia, EGD 2021 recall this year, CAD status post DES stent 2/5, last dose of Plavix this morning presents to the ER with presyncopal episode and hematemesis 450 cc maroon-colored blood with left upper quadrant abdominal discomfort. Likely this represents esophagitis, gastritis, peptic ulcer disease, Cameron's lesion, worsened with Plavix use.  -Hold Plavix, pending endoscopy can discuss with cardiology about reinitiation or cessation -With active bleeding will set up for EGD tomorrow with Dr. Elnoria Howard if patient's potassium allows, currently at 5.1, will recheck in the morning but likely this is more hemoconcentration.  discussed risks of EGD with patient today, including risk of sedation, bleeding or perforation.  Patient provides understanding and gave verbal consent to proceed. -IV Protonix Bolus with subsequent BID dosing -Clear liquids tonight, NPO at midnight -Trend Hgb/Hct Q6H for 4 times.  -Continue volume resuscitation as able with Hgb goal >8 -Patient will require likely outpatient colonoscopy for FOBT positive as patient is overdue.  Thank you for your kind consultation, we will continue to follow.   Doree Albee  07/12/2022, 4:40 PM   GI ATTENDING  History, labs, x-rays reviewed. Agree with comprehensive evaluation as outlined above.  83 F with multiple medical problems CAD with DES 04/2022 on Plavix. Has significant hematemesis in ER. 3 gm decrease in Hg from baseline of 14, with increase Bun/Cr. VSS.   Being admitted, IV  PPI, close monitoring and supportive measures as needed. Needs EGD tomorrow (or sooner if she decompensates) as she needs to be on Plavix with recent DES. Dr. Elnoria Howard to perform and is aware of this patient currently. She is high risk given her co-morbidities,  and the need for Plavix due to recent DES.  Wilhemina Bonito. Eda Keys., M.D. [facility] Division of Gastroenterology

## 2022-07-12 NOTE — ED Notes (Signed)
Report attempted. Was told the receiving nurse just got a patient and the patient had just arrived. She stated it would be about 15 more minutes before the nurse can receive report.

## 2022-07-12 NOTE — H&P (Signed)
History and Physical    Patient: Sara [name] ZOX:[id number] DOB: 10/01/1945 DOA: 07/12/2022 DOS: the patient was seen and examined on 07/12/2022 PCP: [name], Buren Kos, MD  Patient coming from: Home  Chief Complaint:  Chief Complaint  Patient presents with   Hematemesis   HPI: Sara [name] is a 77 y.o. female with medical history significant for CAD status post DES placement to RCA in 04/2022, hiatal hernia status post surgical repair, CKD (baseline creatinine 1.1), SVT, OA, who presents after having bloody emesis at home.  Patient reports that she has been dealing with a pain in her epigastric to left-sided chest region for a bit now.  She has been seeing her PCP, cardiologist, and gastroenterologist to work this issue up.  Earlier today she began to feel unwell, she felt nauseous and had emesis of what looked to her like red Jell-O.  This was alarming to her and so she presented to the ED.  Patient reports that she is compliant with all of her medications.  She recently received a drug-eluting stent 2 months ago and has been taking Plavix regularly.  She has CKD and so she does not take NSAIDs, does not drink significant amounts of alcohol.  In the ED initial vital signs notable for mild tachycardia, low normal blood pressures.  Initial CBC showed drop in hemoglobin to 11.2 down from 14.2 a month ago, and it subsequently dropped 4 hours later to 9.6 on recheck.  BMP notable for creatinine of 1.7 (baseline 1.1).  Serial troponins were normal.  CTPA was obtained to rule out pulmonary embolism, significant motion artifact but no large pulmonary embolism seen.  However a large hiatal hernia along with a dilated and fluid-filled esophagus was seen. EKG showed sinus tachycardia, no other acute findings.  Type and screen was obtained, she was started on an IV PPI gtt., and gastroenterology was consulted.  During her stay in the ED she had emesis of an additional 450 mL of what appeared to be  hematemesis.  She was admitted for further management and workup.   Review of Systems: As mentioned in the history of present illness. All other systems reviewed and are negative. Past Medical History:  Diagnosis Date   Anxiety 07/2020   Aortic dilatation 03/25/2022   38 mm noted on echo   Arthritis 11/2017   Barrett's esophagus    CAD (coronary artery disease)    Cataract    had surgery   Chronic kidney disease, stage 3a    GERD (gastroesophageal reflux disease)    hx of    HIATAL HERNIA    s/p surgical repair   Hyperlipidemia    HYPERTENSION    Kidney stone    1990s   Known medical problems 07/12/2022   LVH (left ventricular hypertrophy)    Osteoporosis    PAT (paroxysmal atrial tachycardia)    Premature atrial contractions    Wellness examination 08/22/2021   Past Surgical History:  Procedure Laterality Date   ABDOMINAL HYSTERECTOMY  06/12/2012   CATARACT EXTRACTION  2009   both eyes   CHOLECYSTECTOMY  11/02/2012   Procedure: CHOLECYSTECTOMY;  Surgeon: Valarie Merino, MD;  Location: [facility];  Service: General;;   COLOSTOMY     CORONARY STENT INTERVENTION N/A 04/29/2022   Procedure: CORONARY STENT INTERVENTION;  Surgeon: Yvonne Kendall, MD;  Location: [facility];  Service: Cardiovascular;  Laterality: N/A;   CORONARY ULTRASOUND/IVUS N/A 04/29/2022   Procedure: Intravascular Ultrasound/IVUS;  Surgeon: Yvonne Kendall,  MD;  Location: [facility];  Service: Cardiovascular;  Laterality: N/A;   CYSTOCELE REPAIR N/A 06/12/2012   Procedure: ANTERIOR REPAIR (CYSTOCELE);  Surgeon: Robley Fries, MD;  Location: [facility];  Service: Gynecology;  Laterality: N/A;   EYE SURGERY  Cataract removal 2009   HERNIA REPAIR  2011   HIATAL HERNIA REPAIR  2011   LITHOTRIPSY     RIGHT/LEFT HEART CATH AND CORONARY ANGIOGRAPHY N/A 04/29/2022   Procedure: RIGHT/LEFT HEART CATH AND CORONARY ANGIOGRAPHY;  Surgeon: Yvonne Kendall, MD;  Location: [facility];  Service:  Cardiovascular;  Laterality: N/A;   TONSILLECTOMY  1968   UPPER GASTROINTESTINAL ENDOSCOPY     UPPER GI ENDOSCOPY  11/02/2012   Procedure: UPPER GI ENDOSCOPY;  Surgeon: Valarie Merino, MD;  Location: [facility];  Service: General;;   VAGINAL HYSTERECTOMY N/A 06/12/2012   Procedure: HYSTERECTOMY VAGINAL;  Surgeon: Robley Fries, MD;  Location: [facility];  Service: Gynecology;  Laterality: N/A;   Social History:  reports that she has never smoked. She has been exposed to tobacco smoke. She has never used smokeless tobacco. She reports that she does not drink alcohol and does not use drugs.  Allergies  Allergen Reactions   Augmentin [Amoxicillin-Pot Clavulanate] Diarrhea   Metoprolol     Had prior to cor CT, felt very dizzy   Nsaids     Avoid due to diminished kidney function    Family History  Problem Relation Age of Onset   Breast cancer Mother 72       with mets    Cancer Mother    Hypertension Mother    Early death Father    Heart disease Father    Kidney disease Father    Hypertension Other        Parent   Breast cancer Other    Heart disease Other        parent, grandparent   Colon cancer Paternal Aunt    Breast cancer Maternal Grandmother    Asthma Maternal Grandmother    Cancer Maternal Grandmother    Cancer Paternal Uncle    Early death Paternal Uncle    Cancer Paternal Aunt    Esophageal cancer Neg Hx    Rectal cancer Neg Hx    Stomach cancer Neg Hx    Colon polyps Neg Hx     Prior to Admission medications   Medication Sig Start Date End Date Taking? Authorizing Provider  aspirin EC 81 MG tablet Take 1 tablet (81 mg total) by mouth daily. 01/15/22   Duke Salvia, MD  Cholecalciferol (QC VITAMIN D3) 50 MCG (2000 UT) CAPS Take 1 capsule by mouth daily.    [provider]  clopidogrel (PLAVIX) 75 MG tablet Take 1 tablet (75 mg total) by mouth daily. 04/29/22   Azalee Course, PA  COVID-19 mRNA vaccine [phone] (COMIRNATY) SUSP injection Inject into the  muscle. 01/03/22   Judyann Munson, MD  cyanocobalamin (VITAMIN B12) 1000 MCG tablet Take 1,000 mcg by mouth daily.    [provider]  furosemide (LASIX) 20 MG tablet Take 1 tablet by mouth daily for 3 days, then take as needed for fluid Patient taking differently: Take 20 mg by mouth daily as needed for edema. 03/11/22   Duke Salvia, MD  losartan (COZAAR) 25 MG tablet Take 1 tablet (25 mg total) by mouth daily. 06/24/22   Sharlene Dory, PA-C  methocarbamol (ROBAXIN) 500 MG tablet Take 1-2 tablets (500-1,000 mg total)  by mouth every 6 to 8 hours as needed for muscle spasms/tension. 01/28/22     Multiple Vitamin (MULTIVITAMIN WITH MINERALS) TABS Take 1 tablet by mouth daily.    [provider]  nitroGLYCERIN (NITROSTAT) 0.4 MG SL tablet Place 1 tablet (0.4 mg total) under the tongue every 5 (five) minutes as needed for chest pain. 05/13/22 08/11/22  Carlos Levering, NP  Probiotic Product (PROBIOTIC DAILY PO) Take 1 capsule by mouth daily.    [provider]  rosuvastatin (CRESTOR) 5 MG tablet Take 1 tablet (5 mg total) by mouth daily. 02/04/22   Duke Salvia, MD  verapamil (CALAN-SR) 180 MG CR tablet Take 1 tablet (180 mg total) by mouth daily. 09/04/21   Duke Salvia, MD  Zoster Vaccine Adjuvanted [facility]) injection Inject into the muscle. 06/22/21       Physical Exam: Vitals:   07/12/22 1545 07/12/22 1806 07/12/22 1915 07/12/22 1943  BP: 123/62  108/69 120/76  Pulse: 98  (!) 110 (!) 102  Resp:    20  Temp:  98.3 F (36.8 C)  98.6 F (37 C)  TempSrc:  Oral  Oral  SpO2: 100%  98% 99%  Weight:      Height:       Physical Exam Vitals reviewed.  Constitutional:      General: She is not in acute distress.    Appearance: Normal appearance. She is not ill-appearing, toxic-appearing or diaphoretic.  HENT:     Head: Normocephalic and atraumatic.     Mouth/Throat:     Mouth: Mucous membranes are moist.  Eyes:     General: No scleral  icterus. Cardiovascular:     Rate and Rhythm: Normal rate and regular rhythm.     Heart sounds: Normal heart sounds.  Pulmonary:     Effort: Pulmonary effort is normal.     Breath sounds: Normal breath sounds.  Abdominal:     General: Abdomen is flat. Bowel sounds are normal. There is no distension.     Palpations: Abdomen is soft.     Tenderness: There is no abdominal tenderness.  Musculoskeletal:     Right lower leg: No edema.     Left lower leg: No edema.  Skin:    General: Skin is warm and dry.  Neurological:     General: No focal deficit present.     Mental Status: She is alert.  Psychiatric:        Mood and Affect: Mood normal.        Behavior: Behavior normal.     Data Reviewed: Results for orders placed or performed during the hospital encounter of 07/12/22 (from the past 24 hour(s))  Basic metabolic panel     Status: Abnormal   Collection Time: 07/12/22  1:48 PM  Result Value Ref Range   Sodium 138 135 - 145 mmol/L   Potassium 5.2 (H) 3.5 - 5.1 mmol/L   Chloride 106 98 - 111 mmol/L   CO2 23 22 - 32 mmol/L   Glucose, Bld 141 (H) 70 - 99 mg/dL   BUN 44 (H) 8 - 23 mg/dL   Creatinine, Ser 4.09 (H) 0.44 - 1.00 mg/dL   Calcium 8.6 (L) 8.9 - 10.3 mg/dL   GFR, Estimated 30 (L) >60 mL/min   Anion gap 9 5 - 15  CBC     Status: Abnormal   Collection Time: 07/12/22  1:48 PM  Result Value Ref Range   WBC 11.4 (H) 4.0 - 10.5 K/uL  RBC 3.62 (L) 3.87 - 5.11 MIL/uL   Hemoglobin 11.2 (L) 12.0 - 15.0 g/dL   HCT 16.1 (L) 09.6 - 04.5 %   MCV 95.9 80.0 - 100.0 fL   MCH 30.9 26.0 - 34.0 pg   MCHC 32.3 30.0 - 36.0 g/dL   RDW 40.9 81.1 - 91.4 %   Platelets 270 150 - 400 K/uL   nRBC 0.0 0.0 - 0.2 %  Urinalysis, Routine w reflex microscopic -Urine, Clean Catch     Status: Abnormal   Collection Time: 07/12/22  1:48 PM  Result Value Ref Range   Color, Urine YELLOW YELLOW   APPearance CLEAR CLEAR   Specific Gravity, Urine 1.016 1.005 - 1.030   pH 5.0 5.0 - 8.0   Glucose, UA  NEGATIVE NEGATIVE mg/dL   Hgb urine dipstick NEGATIVE NEGATIVE   Bilirubin Urine NEGATIVE NEGATIVE   Ketones, ur NEGATIVE NEGATIVE mg/dL   Protein, ur NEGATIVE NEGATIVE mg/dL   Nitrite NEGATIVE NEGATIVE   Leukocytes,Ua SMALL (A) NEGATIVE   RBC / HPF 0-5 0 - 5 RBC/hpf   WBC, UA 0-5 0 - 5 WBC/hpf   Bacteria, UA NONE SEEN NONE SEEN   Squamous Epithelial / HPF 0-5 0 - 5 /HPF   Mucus PRESENT   CBG monitoring, ED     Status: Abnormal   Collection Time: 07/12/22  2:09 PM  Result Value Ref Range   Glucose-Capillary 135 (H) 70 - 99 mg/dL  Type and screen [facility]     Status: None   Collection Time: 07/12/22  2:16 PM  Result Value Ref Range   ABO/RH(D) O POS    Antibody Screen NEG    Sample Expiration      07/15/2022,2359 Performed at [facility], [address], [place], [place] [address]   Troponin I (High Sensitivity)     Status: None   Collection Time: 07/12/22  2:18 PM  Result Value Ref Range   Troponin I (High Sensitivity) 3 <18 ng/L  Troponin I (High Sensitivity)     Status: None   Collection Time: 07/12/22  5:23 PM  Result Value Ref Range   Troponin I (High Sensitivity) 11 <18 ng/L  Hemoglobin and hematocrit, blood     Status: Abnormal   Collection Time: 07/12/22  5:23 PM  Result Value Ref Range   Hemoglobin 9.6 (L) 12.0 - 15.0 g/dL   HCT 62.1 (L) 30.8 - 65.7 %   CT Angio Chest PE W and/or Wo Contrast CLINICAL DATA:  Recent coronary stent. Shortness of breath. Coughing up blood.  EXAM: CT ANGIOGRAPHY CHEST WITH CONTRAST  TECHNIQUE: Multidetector CT imaging of the chest was performed using the standard protocol during bolus administration of intravenous contrast. Multiplanar CT image reconstructions and MIPs were obtained to evaluate the vascular anatomy.  RADIATION DOSE REDUCTION: This exam was performed according to the departmental dose-optimization program which includes automated exposure control, adjustment of the mA  and/or kV according to patient size and/or use of iterative reconstruction technique.  CONTRAST:  50mL OMNIPAQUE IOHEXOL 350 MG/ML SOLN  COMPARISON:  None Available.  FINDINGS: Cardiovascular: There is significant breathing motion. This limits evaluation for pulmonary emboli. Nondiagnostic for small and peripheral emboli. No large or central embolus identified. Coronary artery calcifications are noted. There are coronary stents identified. Trace pericardial fluid. The heart is nonenlarged. The thoracic aorta has a normal course and caliber with mild atherosclerotic calcified plaque. There is a bovine type aortic arch, a normal variant.  Mediastinum/Nodes: Large hiatal hernia. There is patulous fluid-filled esophagus in the thorax. Mild presumed mucosal enhancement within the distal esophagus posteriorly on series 4, image 52. No specific abnormal lymph node enlargement identified in the axillary region, hilum or mediastinum.  Lungs/Pleura: Diffuse breathing motion. No consolidation, pneumothorax or effusion. There is some dependent atelectasis in the lung bases.  Upper Abdomen: The adrenal glands are preserved in the upper abdomen. There is made of moderate debris in the stomach. Diffuse colonic stool. Left-sided colonic diverticula seen at the edge of the imaging field. Normal retrocecal appendix.  Musculoskeletal: Mild degenerative changes seen along the spine.  Review of the MIP images confirms the above findings.  IMPRESSION: Large hiatal hernia. There is a dilated fluid-filled esophagus. Submucosal hyperenhancement as well along the lower esophagus. Further workup when clinically appropriate.  Significant breathing motion significantly limiting evaluation for pulmonary emboli. No large or central embolus.  Trace pericardial fluid.  Aortic Atherosclerosis (ICD10-I70.0).  Electronically Signed   By: Karen Kays M.D.   On: 07/12/2022 16:49     Assessment and  Plan: Ohanna Gassert is a 77 y.o. female with medical history significant for CAD status post DES placement to RCA in 04/2022, hiatal hernia status post surgical repair, CKD (baseline creatinine 1.1), SVT, OA, who presents after having bloody emesis at home and is found to have an upper GI bleed with a significant drop in her hemoglobin.   * UGIB (upper gastrointestinal bleed) Likely due to combination of her Plavix therapy for recent cardiac stent as well as her known large hiatal hernia.  Gastroenterology have evaluated patient and are planning on endoscopy in the morning. - Clear liquids for now, n.p.o. at midnight - LR at 125 cc an hour - Trend CBC every 6 hours - Continue IV PPI gtt. - Hold aspirin and Plavix pending EGD, may require discussion on risks and benefits of continuing with cardiology - Given significant ongoing drop in hemoglobin, will type and cross for 2 units, per GI target hemoglobin greater than 8  AKI (acute kidney injury) Likely secondary to combination of acute blood loss anemia and poor p.o. intake in setting of acute illness.  Status post 1 L bolus in ED, will be on maintenance fluids overnight, trend creatinine.  Known medical problems Hypertension-hold verapamil in setting of low normal blood pressures.  Hold losartan in setting of AKI. CAD-hold aspirin and Plavix HLD-hold rosuvastatin      Advance Care Planning:   Code Status: Full Code   Consults: Gastroenterology  Family Communication: None  Severity of Illness: The appropriate patient status for this patient is INPATIENT. Inpatient status is judged to be reasonable and necessary in order to provide the required intensity of service to ensure the patient's safety. The patient's presenting symptoms, physical exam findings, and initial radiographic and laboratory data in the context of their chronic comorbidities is felt to place them at high risk for further clinical deterioration. Furthermore, it is  not anticipated that the patient will be medically stable for discharge from the hospital within 2 midnights of admission.   * I certify that at the point of admission it is my clinical judgment that the patient will require inpatient hospital care spanning beyond 2 midnights from the point of admission due to high intensity of service, high risk for further deterioration and high frequency of surveillance required.*  Author: Venora Maples, MD 07/12/2022 7:48 PM  For on call review [email or web address].

## 2022-07-12 NOTE — Assessment & Plan Note (Addendum)
Likely due to combination of her Plavix therapy for recent cardiac stent as well as her known large hiatal hernia.  Gastroenterology have evaluated patient and are planning on endoscopy in the morning. - Clear liquids for now, n.p.o. at midnight - LR at 125 cc an hour - Trend CBC every 6 hours - Continue IV PPI gtt. - Hold aspirin and Plavix pending EGD, may require discussion on risks and benefits of continuing with cardiology - Given significant ongoing drop in hemoglobin, will type and cross for 2 units, per GI target hemoglobin greater than 8

## 2022-07-12 NOTE — ED Notes (Signed)
Maralyn Sago, PA at bedside.

## 2022-07-13 ENCOUNTER — Encounter (HOSPITAL_COMMUNITY): Payer: Self-pay | Admitting: Family Medicine

## 2022-07-13 ENCOUNTER — Inpatient Hospital Stay (HOSPITAL_COMMUNITY): Payer: Medicare Other | Admitting: Certified Registered Nurse Anesthetist

## 2022-07-13 ENCOUNTER — Encounter (HOSPITAL_COMMUNITY)
Admission: EM | Disposition: A | Discharge status: Home / Self Care | Payer: Self-pay | Source: Home / Self Care | Attending: Internal Medicine

## 2022-07-13 DIAGNOSIS — N189 Chronic kidney disease, unspecified: Secondary | ICD-10-CM

## 2022-07-13 DIAGNOSIS — N1831 Chronic kidney disease, stage 3a: Secondary | ICD-10-CM | POA: Diagnosis not present

## 2022-07-13 DIAGNOSIS — I2510 Atherosclerotic heart disease of native coronary artery without angina pectoris: Secondary | ICD-10-CM | POA: Diagnosis not present

## 2022-07-13 DIAGNOSIS — I129 Hypertensive chronic kidney disease with stage 1 through stage 4 chronic kidney disease, or unspecified chronic kidney disease: Secondary | ICD-10-CM

## 2022-07-13 DIAGNOSIS — K2270 Barrett's esophagus without dysplasia: Secondary | ICD-10-CM

## 2022-07-13 DIAGNOSIS — D509 Iron deficiency anemia, unspecified: Secondary | ICD-10-CM

## 2022-07-13 DIAGNOSIS — K922 Gastrointestinal hemorrhage, unspecified: Secondary | ICD-10-CM | POA: Diagnosis not present

## 2022-07-13 DIAGNOSIS — N179 Acute kidney failure, unspecified: Secondary | ICD-10-CM | POA: Diagnosis not present

## 2022-07-13 DIAGNOSIS — K449 Diaphragmatic hernia without obstruction or gangrene: Secondary | ICD-10-CM

## 2022-07-13 HISTORY — PX: ESOPHAGOGASTRODUODENOSCOPY (EGD) WITH PROPOFOL: SHX5813

## 2022-07-13 LAB — CBC WITH DIFFERENTIAL/PLATELET
Abs Immature Granulocytes: 0.03 10*3/uL (ref 0.00–0.07)
Basophils Absolute: 0 10*3/uL (ref 0.0–0.1)
Basophils Relative: 0 %
Eosinophils Absolute: 0.1 10*3/uL (ref 0.0–0.5)
Eosinophils Relative: 1 %
HCT: 24.3 % — ABNORMAL LOW (ref 36.0–46.0)
Hemoglobin: 7.9 g/dL — ABNORMAL LOW (ref 12.0–15.0)
Immature Granulocytes: 0 %
Lymphocytes Relative: 29 %
Lymphs Abs: 2.2 10*3/uL (ref 0.7–4.0)
MCH: 31.3 pg (ref 26.0–34.0)
MCHC: 32.5 g/dL (ref 30.0–36.0)
MCV: 96.4 fL (ref 80.0–100.0)
Monocytes Absolute: 0.7 10*3/uL (ref 0.1–1.0)
Monocytes Relative: 9 %
Neutro Abs: 4.8 10*3/uL (ref 1.7–7.7)
Neutrophils Relative %: 61 %
Platelets: 186 10*3/uL (ref 150–400)
RBC: 2.52 MIL/uL — ABNORMAL LOW (ref 3.87–5.11)
RDW: 13.9 % (ref 11.5–15.5)
WBC: 7.8 10*3/uL (ref 4.0–10.5)
nRBC: 0 % (ref 0.0–0.2)

## 2022-07-13 LAB — MAGNESIUM: Magnesium: 2.2 mg/dL (ref 1.7–2.4)

## 2022-07-13 LAB — PHOSPHORUS: Phosphorus: 3.2 mg/dL (ref 2.5–4.6)

## 2022-07-13 LAB — IRON AND TIBC
Iron: 61 ug/dL (ref 28–170)
Saturation Ratios: 27 % (ref 10.4–31.8)
TIBC: 225 ug/dL — ABNORMAL LOW (ref 250–450)
UIBC: 164 ug/dL

## 2022-07-13 LAB — RETICULOCYTES
Immature Retic Fract: 9 % (ref 2.3–15.9)
RBC.: 2.53 MIL/uL — ABNORMAL LOW (ref 3.87–5.11)
Retic Count, Absolute: 23.8 10*3/uL (ref 19.0–186.0)
Retic Ct Pct: 0.9 % (ref 0.4–3.1)

## 2022-07-13 LAB — BASIC METABOLIC PANEL
Anion gap: 7 (ref 5–15)
BUN: 66 mg/dL — ABNORMAL HIGH (ref 8–23)
CO2: 24 mmol/L (ref 22–32)
Calcium: 8.3 mg/dL — ABNORMAL LOW (ref 8.9–10.3)
Chloride: 109 mmol/L (ref 98–111)
Creatinine, Ser: 1.58 mg/dL — ABNORMAL HIGH (ref 0.44–1.00)
GFR, Estimated: 34 mL/min — ABNORMAL LOW (ref >60)
Glucose, Bld: 95 mg/dL (ref 70–99)
Potassium: 5 mmol/L (ref 3.5–5.1)
Sodium: 140 mmol/L (ref 135–145)

## 2022-07-13 LAB — HEPATIC FUNCTION PANEL
ALT: 11 U/L (ref 0–44)
AST: 14 U/L — ABNORMAL LOW (ref 15–41)
Albumin: 2.6 g/dL — ABNORMAL LOW (ref 3.5–5.0)
Alkaline Phosphatase: 39 U/L (ref 38–126)
Bilirubin, Direct: <0.1 mg/dL (ref 0.0–0.2)
Total Bilirubin: 0.7 mg/dL (ref 0.3–1.2)
Total Protein: 4.8 g/dL — ABNORMAL LOW (ref 6.5–8.1)

## 2022-07-13 LAB — VITAMIN B12: Vitamin B-12: 454 pg/mL (ref 180–914)

## 2022-07-13 LAB — FOLATE: Folate: 21.1 ng/mL (ref >5.9)

## 2022-07-13 LAB — FERRITIN: Ferritin: 51 ng/mL (ref 11–307)

## 2022-07-13 SURGERY — ESOPHAGOGASTRODUODENOSCOPY (EGD) WITH PROPOFOL
Anesthesia: General

## 2022-07-13 MED ORDER — LACTATED RINGERS IV SOLN: INTRAVENOUS | Status: AC

## 2022-07-13 MED ORDER — PROPOFOL 10 MG/ML IV BOLUS
INTRAVENOUS | Status: DC | PRN
  Administered 2022-07-13: 120 mg via INTRAVENOUS

## 2022-07-13 MED ORDER — SUCCINYLCHOLINE CHLORIDE 200 MG/10ML IV SOSY
PREFILLED_SYRINGE | INTRAVENOUS | Status: DC | PRN
  Administered 2022-07-13: 120 mg via INTRAVENOUS

## 2022-07-13 MED ORDER — SODIUM CHLORIDE 0.9 % IV SOLN: INTRAVENOUS | Status: DC

## 2022-07-13 MED ORDER — FENTANYL CITRATE (PF) 100 MCG/2ML IJ SOLN
INTRAMUSCULAR | Status: DC | PRN
  Administered 2022-07-13: 50 ug via INTRAVENOUS

## 2022-07-13 MED ORDER — LIDOCAINE 2% (20 MG/ML) 5 ML SYRINGE
INTRAMUSCULAR | Status: DC | PRN
  Administered 2022-07-13: 60 mg via INTRAVENOUS

## 2022-07-13 MED ORDER — CLOPIDOGREL BISULFATE 75 MG PO TABS
75.0000 mg | ORAL_TABLET | Freq: Every day | ORAL | Status: DC
  Administered 2022-07-13 – 2022-07-15 (×3): 75 mg via ORAL
  Filled 2022-07-13 (×3): qty 1

## 2022-07-13 MED ORDER — ONDANSETRON HCL 4 MG/2ML IJ SOLN
INTRAMUSCULAR | Status: DC | PRN
  Administered 2022-07-13: 4 mg via INTRAVENOUS

## 2022-07-13 MED ORDER — FENTANYL CITRATE (PF) 100 MCG/2ML IJ SOLN
INTRAMUSCULAR | Status: AC
  Filled 2022-07-13: qty 2

## 2022-07-13 MED ORDER — DEXAMETHASONE SODIUM PHOSPHATE 10 MG/ML IJ SOLN
INTRAMUSCULAR | Status: DC | PRN
  Administered 2022-07-13: 4 mg via INTRAVENOUS

## 2022-07-13 MED ORDER — PHENYLEPHRINE 80 MCG/ML (10ML) SYRINGE FOR IV PUSH (FOR BLOOD PRESSURE SUPPORT)
PREFILLED_SYRINGE | INTRAVENOUS | Status: DC | PRN
  Administered 2022-07-13: 160 ug via INTRAVENOUS

## 2022-07-13 SURGICAL SUPPLY — 15 items

## 2022-07-13 NOTE — Interval H&P Note (Signed)
History and Physical Interval Note:  07/13/2022 1:52 PM  Sara [name]  has presented today for surgery, with the diagnosis of Hematemesis.  The various methods of treatment have been discussed with the patient and family. After consideration of risks, benefits and other options for treatment, the patient has consented to  Procedure(s): ESOPHAGOGASTRODUODENOSCOPY (EGD) WITH PROPOFOL (N/A) as a surgical intervention.  The patient's history has been reviewed, patient examined, no change in status, stable for surgery.  I have reviewed the patient's chart and labs.  Questions were answered to the patient's satisfaction.     [name] D

## 2022-07-13 NOTE — Op Note (Signed)
[facility] Patient Name: Sara [name] Procedure Date: 07/13/2022 MRN: [id number] Attending MD: Jeani Hawking , MD, [id number] Date of Birth: 29-Sep-1945 CSN: [id number] Age: 77 Admit Type: Inpatient Procedure:                Upper GI endoscopy Indications:              Iron deficiency anemia, Hematemesis Providers:                Jeani Hawking, MD, Adolph Pollack, RN, Sunday Corn                            [place], Technician Referring MD:              Medicines:                General Anesthesia Complications:            No immediate complications. Estimated Blood Loss:     Estimated blood loss: none. Procedure:                Pre-Anesthesia Assessment:                           - Prior to the procedure, a History and Physical                            was performed, and patient medications and                            allergies were reviewed. The patient's tolerance of                            previous anesthesia was also reviewed. The risks                            and benefits of the procedure and the sedation                            options and risks were discussed with the patient.                            All questions were answered, and informed consent                            was obtained. Prior Anticoagulants: The patient has                            taken no anticoagulant or antiplatelet agents. ASA                            Grade Assessment: III - A patient with severe                            systemic disease. After reviewing the risks and  benefits, the patient was deemed in satisfactory                            condition to undergo the procedure.                           - Sedation was administered by an anesthesia                            professional. Deep sedation was attained.                           After obtaining informed consent, the endoscope was                            passed under direct  vision. Throughout the                            procedure, the patient's blood pressure, pulse, and                            oxygen saturations were monitored continuously. The                            [id number]-[id number] ([id number] Olympus endoscope was introduced                            through the mouth, and advanced to the second part                            of duodenum. The upper GI endoscopy was                            accomplished without difficulty. The patient                            tolerated the procedure well. Scope In: Scope Out: Findings:      One superficial esophageal ulcer with no stigmata of recent bleeding was       found 30 cm from the incisors. The lesion was 10 mm in largest dimension.      A 5 cm hiatal hernia was present.      The gastroesophageal flap valve was visualized endoscopically and       classified as Hill Grade IV (no fold, wide open lumen, hiatal hernia       present).      There were esophageal mucosal changes classified as Barrett's stage       C13-M15 per Prague criteria present in the upper third of the esophagus,       in the middle third of the esophagus and in the lower third of the       esophagus. The maximum longitudinal extent of these mucosal changes was       15 cm in length.      The stomach was normal.      The examined duodenum was normal.      Provation failed to  capture all the images. From the top of the gastric       folds (37 cm) to the Z-line (24 cm) a Barrett's esophagus was noted. The       maximal extent of the Barrett's was noted at 22 cm. There was no       evidence of any masses or nodularity in the Barrett's esophagus. The       ulcer in the mid esophagus was clean-based. Impression:               - Esophageal ulcer with no stigmata of recent                            bleeding.                           - 5 cm hiatal hernia.                           - Gastroesophageal flap valve classified as Hill                             Grade IV (no fold, wide open lumen, hiatal hernia                            present).                           - Esophageal mucosal changes classified as                            Barrett's stage C13-M15 per Prague criteria.                           - Normal stomach.                           - Normal examined duodenum.                           - No specimens collected. Moderate Sedation:      Not Applicable - Patient had care per Anesthesia. Recommendation:           - Return patient to hospital ward for ongoing care.                           - Resume regular diet.                           - Continue present medications.                           - Pantoprazole 40 mg BID.                           - Resume Plavix today. Procedure Code(s):        --- Professional ---  16109, Esophagogastroduodenoscopy, flexible,                            transoral; diagnostic, including collection of                            specimen(s) by brushing or washing, when performed                            (separate procedure) Diagnosis Code(s):        --- Professional ---                           K22.10, Ulcer of esophagus without bleeding                           K44.9, Diaphragmatic hernia without obstruction or                            gangrene                           K22.70, Barrett's esophagus without dysplasia                           D50.9, Iron deficiency anemia, unspecified                           K92.0, Hematemesis CPT copyright 2022 [employer]. All rights reserved. The codes documented in this report are preliminary and upon coder review may  be revised to meet current compliance requirements. Jeani Hawking, MD Jeani Hawking, MD 07/13/2022 2:24:58 PM This report has been signed electronically. Number of Addenda: 0

## 2022-07-13 NOTE — Anesthesia Preprocedure Evaluation (Addendum)
Anesthesia Evaluation  Patient identified by MRN, date of birth, ID band Patient awake    Reviewed: Allergy & Precautions, NPO status , Patient's Chart, lab work & pertinent test results  History of Anesthesia Complications Negative for: history of anesthetic complications  Airway Mallampati: II  TM Distance: >3 FB Neck ROM: Full    Dental  (+) Dental Advisory Given, Chipped   Pulmonary neg pulmonary ROS   Pulmonary exam normal        Cardiovascular hypertension, Pt. on medications + CAD  Normal cardiovascular exam   '24 Cath - 1.Severe single-vessel coronary artery disease with 99% proximal/mid RCA lesion with TIMI-1 flow and left-to-right collaterals. 2.Mild, non-obstructive coronary artery disease involving the LMCA, LAD, and LCx, as detailed below. 3.Normal left and right-heart filling pressures. 4.Normal Fick cardiac output/index. 5.Successful IVUS-guided PCI to proximal/mid RCA using Synergy 3.0 x 32 mm drug-eluting stent (post-dilated to 3.3 mm) with 0% residual stenosis and TIMI-3 flow.  '24 TTE - EF 60 to 65%. There is mild asymmetric left ventricular hypertrophy of the basal-septal segment. Trivial mitral valve regurgitation. There is borderline dilatation of the ascending aorta, measuring 38 mm.      Neuro/Psych  PSYCHIATRIC DISORDERS Anxiety     negative neurological ROS     GI/Hepatic Neg liver ROS,GERD  ,, Hiatal hernia  Hematemesis     Endo/Other  negative endocrine ROS    Renal/GU CRFRenal disease     Musculoskeletal  (+) Arthritis ,    Abdominal   Peds  Hematology  (+) Blood dyscrasia, anemia  On plavix    Anesthesia Other Findings NGT in place   Reproductive/Obstetrics                             Anesthesia Physical Anesthesia Plan  ASA: 3 and emergent  Anesthesia Plan: General   Post-op Pain Management: Minimal or no pain anticipated   Induction:  Intravenous, Rapid sequence and Cricoid pressure planned  PONV Risk Score and Plan: 3 and Treatment may vary due to age or medical condition, Ondansetron and Dexamethasone  Airway Management Planned: Oral ETT  Additional Equipment: None  Intra-op Plan:   Post-operative Plan: Extubation in OR  Informed Consent: I have reviewed the patients History and Physical, chart, labs and discussed the procedure including the risks, benefits and alternatives for the proposed anesthesia with the patient or authorized representative who has indicated his/her understanding and acceptance.     Dental advisory given  Plan Discussed with: CRNA and Anesthesiologist  Anesthesia Plan Comments:         Anesthesia Quick Evaluation

## 2022-07-13 NOTE — Anesthesia Procedure Notes (Signed)
Procedure Name: Intubation Date/Time: 07/13/2022 1:59 PM  Performed by: Epimenio Sarin, CRNAPre-anesthesia Checklist: Patient identified, Emergency Drugs available, Suction available, Patient being monitored and Timeout performed Patient Re-evaluated:Patient Re-evaluated prior to induction Oxygen Delivery Method: Circle system utilized Preoxygenation: Pre-oxygenation with 100% oxygen Induction Type: IV induction, Rapid sequence and Cricoid Pressure applied Laryngoscope Size: Mac and 3 Grade View: Grade I Tube type: Oral Tube size: 7.0 mm Number of attempts: 1 Airway Equipment and Method: Stylet Placement Confirmation: ETT inserted through vocal cords under direct vision, positive ETCO2 and breath sounds checked- equal and bilateral Secured at: 21 cm Tube secured with: Tape Dental Injury: Teeth and Oropharynx as per pre-operative assessment

## 2022-07-13 NOTE — Progress Notes (Signed)
PROGRESS NOTE    Sara [name]  ZOX:[id number] DOB: 1945/10/01 DOA: 07/12/2022 PCP: [name], Raymond J, MD   Brief Narrative:  HPI per Dr. Merian Capron on 07/12/22 HPI: Sara [name] is a 77 y.o. female with medical history significant for CAD status post DES placement to RCA in 04/2022, hiatal hernia status post surgical repair, CKD (baseline creatinine 1.1), SVT, OA, who presents after having bloody emesis at home.   Patient reports that she has been dealing with a pain in her epigastric to left-sided chest region for a bit now.  She has been seeing her PCP, cardiologist, and gastroenterologist to work this issue up.  Earlier today she began to feel unwell, she felt nauseous and had emesis of what looked to her like red Jell-O.  This was alarming to her and so she presented to the ED.  Patient reports that she is compliant with all of her medications.  She recently received a drug-eluting stent 2 months ago and has been taking Plavix regularly.  She has CKD and so she does not take NSAIDs, does not drink significant amounts of alcohol.   In the ED initial vital signs notable for mild tachycardia, low normal blood pressures.  Initial CBC showed drop in hemoglobin to 11.2 down from 14.2 a month ago, and it subsequently dropped 4 hours later to 9.6 on recheck.  BMP notable for creatinine of 1.7 (baseline 1.1).  Serial troponins were normal.  CTPA was obtained to rule out pulmonary embolism, significant motion artifact but no large pulmonary embolism seen.  However a large hiatal hernia along with a dilated and fluid-filled esophagus was seen. EKG showed sinus tachycardia, no other acute findings.  Type and screen was obtained, she was started on an IV PPI gtt., and gastroenterology was consulted.  During her stay in the ED she had emesis of an additional 450 mL of what appeared to be hematemesis.   She was admitted for further management and workup.  **Interim History Underwent EGD and  found to have an esophageal ulcer with no stigmata for bleeding.  GI recommends changing to PPI twice daily and resuming Plavix today and for any further signs and symptoms of bleeding  Assessment and Plan:  *UGIB (upper gastrointestinal bleed) in the setting of esophageal ulcer ABLA -Likely due to combination of her Plavix therapy for recent cardiac stent as well as her known large hiatal hernia.  Gastroenterology have evaluated patient and are planning on endoscopy in the morning. - Clear liquids for now, n.p.o. at midnight - LR at 125 cc an hour -Hgb/Hct Trend: Recent Labs  Lab 07/12/22 1348 07/12/22 1723  HGB 11.2* 9.6*  HCT 34.7* 30.3*  MCV 95.9  --   - Trend CBC every 6 hours - Continue IV PPI gtt. - Hold aspirin and Plavix pending EGD -With cardiology Dr. Izora Ribas who recommends resuming aspirin at the minimum once okay with GI and recommending deferring resuming Plavix to GI and safe - Given significant ongoing drop in hemoglobin, will type and cross for 2 units, per GI target hemoglobin greater than 8 -EGD done and showed an esophageal ulcer with no stigmata of recent bleeding as well as a 5 cm hiatal hernia as well as a gastro esophageal flap valve classified as Hill grade 4 and esophageal mucosal changes classified as Barrett's stage C 13-M15 per Prague criteria as well as a normal stomach and normal examined duodenum and no specimens collected -GI recommends resuming Plavix today and pantoprazole PPI  4 mg twice daily  AKI (acute kidney injury) on CKD Stage 3a -Likely secondary to combination of acute blood loss anemia and poor p.o. intake in setting of acute illness.   -Baseline Cr is 1.1 -Status post 1 L bolus in ED, will be on maintenance fluids overnight, trend creatinine. -BUN/Cr Trend: Recent Labs  Lab 07/12/22 1348 07/13/22 0438  BUN 44* 66*  CREATININE 1.75* 1.58*  -Getting IV fluid hydration with LR at 125 units/h but will reduce the rate to 75 MLS per  hour for another 12 more hours -Avoid Nephrotoxic Medications, Contrast Dyes, Hypotension and Dehydration to Ensure Adequate Renal Perfusion and will need to Renally Adjust Meds -Continue to Monitor and Trend Renal Function carefully and repeat CMP in the AM    Hyperkalemia -K+ Trend Recent Labs  Lab 07/12/22 1348 07/13/22 0438  K 5.2* 5.0  -Continue to Monitor and Trend and repeat CMP in the AM  Leukocytosis -WBC Trend: Recent Labs  Lab 07/12/22 1348  WBC 11.4*  -Continue to Monitor and Trend and repeat CBC in the AM  Hypertension -hold verapamil in setting of low normal blood pressures.  Hold losartan in setting of AKI.  CAD s/p DES placement to the RCA in 05/18/22 -Hold aspirin and Plavix now and resume as soon as possible per GI recommendations given her recent stenting and after EGD GI recommends resuming Plavix  HLD -hold rosuvastatin for now and resume in the morning  Hypoalbuminemia -Patient's Albumin Trend: Recent Labs  Lab 07/13/22 1010  ALBUMIN 2.6*  -Continue to Monitor and Trend and repeat CMP in the AM  DVT prophylaxis: SCDs Start: 07/12/22 1958    Code Status: Full Code Family Communication: No family present at bedside  Disposition Plan:  Level of care: Progressive Status is: Inpatient Remains inpatient appropriate because: His further clinical improvement and clearance by GI   Consultants:  Gastroenterology  Procedures:  EGD Findings:      One superficial esophageal ulcer with no stigmata of recent bleeding was       found 30 cm from the incisors. The lesion was 10 mm in largest dimension.      A 5 cm hiatal hernia was present.      The gastroesophageal flap valve was visualized endoscopically and       classified as Hill Grade IV (no fold, wide open lumen, hiatal hernia       present).      There were esophageal mucosal changes classified as Barrett's stage       C13-M15 per Prague criteria present in the upper third of the esophagus,        in the middle third of the esophagus and in the lower third of the       esophagus. The maximum longitudinal extent of these mucosal changes was       15 cm in length.      The stomach was normal.      The examined duodenum was normal.      Provation failed to capture all the images. From the top of the gastric       folds (37 cm) to the Z-line (24 cm) a Barrett's esophagus was noted. The       maximal extent of the Barrett's was noted at 22 cm. There was no       evidence of any masses or nodularity in the Barrett's esophagus. The       ulcer in the mid esophagus was clean-based.  Impression:               - Esophageal ulcer with no stigmata of recent                            bleeding.                           - 5 cm hiatal hernia.                           - Gastroesophageal flap valve classified as Hill                            Grade IV (no fold, wide open lumen, hiatal hernia                            present).                           - Esophageal mucosal changes classified as                            Barrett's stage C13-M15 per Prague criteria.                           - Normal stomach.                           - Normal examined duodenum.                           - No specimens collected. Moderate Sedation:      Not Applicable - Patient had care per Anesthesia. Recommendation:           - Return patient to hospital ward for ongoing care.                           - Resume regular diet.                           - Continue present medications.                           - Pantoprazole 40 mg BID.                           - Resume Plavix today.  Antimicrobials:  Anti-infectives (From admission, onward)    None       Subjective: Seen and examined at bedside and she is awaiting for EGD.  Had NG tube in place to suction and states her nausea vomiting is improved.  Denies any chest pain or lightheadedness or dizziness.  Had some abdominal discomfort earlier but not  now.  No other concerns or complaints at this time.  Objective: Vitals:   07/12/22 1915 07/12/22 1943 07/13/22 0057 07/13/22 0505  BP: 108/69 120/76 111/63 (!) 141/68  Pulse: (!) 110 (!) 102 (!) 102 (!) 105  Resp:  20 20 20   Temp:  98.6 F (37 C) 97.7 F (36.5 C) 98.4 F (36.9 C)  TempSrc:  Oral Oral Oral  SpO2: 98% 99% 99% 98%  Weight:      Height:        Intake/Output Summary (Last 24 hours) at 07/13/2022 0865 Last data filed at 07/13/2022 0600 Gross per 24 hour  Intake 1369.12 ml  Output 1450 ml  Net -80.88 ml   Filed Weights   07/12/22 1348  Weight: 74.4 kg   Examination: Physical Exam:  Constitutional: WN/WD overweight Caucasian female in no acute distress admit NG tube in place to suction Respiratory: Diminished to auscultation bilaterally, no wheezing, rales, rhonchi or crackles. Normal respiratory effort and patient is not tachypenic. No accessory muscle use.  Unlabored breathing Cardiovascular: RRR, no murmurs / rubs / gallops. S1 and S2 auscultated. No extremity edema.  Abdomen: Soft, non-tender, slightly distended secondary body habitus.. Bowel sounds positive.  GU: Deferred. Musculoskeletal: No clubbing / cyanosis of digits/nails. No joint deformity upper and lower extremities.  Skin: No rashes, lesions, ulcers on a limited skin evaluation. No induration; Warm and dry.  Neurologic: CN 2-12 grossly intact with no focal deficits. Romberg sign and cerebellar reflexes not assessed.  Psychiatric: Normal judgment and insight. Alert and oriented x 3. Normal mood and appropriate affect.   Data Reviewed: I have personally reviewed following labs and imaging studies  CBC: Recent Labs  Lab 07/12/22 1348 07/12/22 1723  WBC 11.4*  --   HGB 11.2* 9.6*  HCT 34.7* 30.3*  MCV 95.9  --   PLT 270  --    Basic Metabolic Panel: Recent Labs  Lab 07/12/22 1348 07/13/22 0438  NA 138 140  K 5.2* 5.0  CL 106 109  CO2 23 24  GLUCOSE 141* 95  BUN 44* 66*  CREATININE  1.75* 1.58*  CALCIUM 8.6* 8.3*   GFR: Estimated Creatinine Clearance: 32.2 mL/min (A) (by C-G formula based on SCr of 1.58 mg/dL (H)). Liver Function Tests: No results for input(s): "AST", "ALT", "ALKPHOS", "BILITOT", "PROT", "ALBUMIN" in the last 168 hours. No results for input(s): "LIPASE", "AMYLASE" in the last 168 hours. No results for input(s): "AMMONIA" in the last 168 hours. Coagulation Profile: No results for input(s): "INR", "PROTIME" in the last 168 hours. Cardiac Enzymes: No results for input(s): "CKTOTAL", "CKMB", "CKMBINDEX", "TROPONINI" in the last 168 hours. BNP (last 3 results) Recent Labs    02/27/22 1435  PROBNP 203   HbA1C: No results for input(s): "HGBA1C" in the last 72 hours. CBG: Recent Labs  Lab 07/12/22 1409  GLUCAP 135*   Lipid Profile: No results for input(s): "CHOL", "HDL", "LDLCALC", "TRIG", "CHOLHDL", "LDLDIRECT" in the last 72 hours. Thyroid Function Tests: No results for input(s): "TSH", "T4TOTAL", "FREET4", "T3FREE", "THYROIDAB" in the last 72 hours. Anemia Panel: No results for input(s): "VITAMINB12", "FOLATE", "FERRITIN", "TIBC", "IRON", "RETICCTPCT" in the last 72 hours. Sepsis Labs: No results for input(s): "PROCALCITON", "LATICACIDVEN" in the last 168 hours.  No results found for this or any previous visit (from the past 240 hour(s)).   Radiology Studies: DG Abd Portable 1 View  Result Date: 07/12/2022 CLINICAL DATA:  NG placement. EXAM: PORTABLE ABDOMEN - 1 VIEW COMPARISON:  Chest CT dated 07/12/2022. FINDINGS: Enteric tube with side port and tip below the diaphragm over the epigastric area likely in the body of the stomach. A hiatal hernia again noted and better seen on the CT. IMPRESSION: Enteric tube with tip likely in the body of the stomach. Electronically Signed  By: Elgie Collard M.D.   On: 07/12/2022 20:24   CT Angio Chest PE W and/or Wo Contrast  Result Date: 07/12/2022 CLINICAL DATA:  Recent coronary stent. Shortness of  breath. Coughing up blood. EXAM: CT ANGIOGRAPHY CHEST WITH CONTRAST TECHNIQUE: Multidetector CT imaging of the chest was performed using the standard protocol during bolus administration of intravenous contrast. Multiplanar CT image reconstructions and MIPs were obtained to evaluate the vascular anatomy. RADIATION DOSE REDUCTION: This exam was performed according to the departmental dose-optimization program which includes automated exposure control, adjustment of the mA and/or kV according to patient size and/or use of iterative reconstruction technique. CONTRAST:  50mL OMNIPAQUE IOHEXOL 350 MG/ML SOLN COMPARISON:  None Available. FINDINGS: Cardiovascular: There is significant breathing motion. This limits evaluation for pulmonary emboli. Nondiagnostic for small and peripheral emboli. No large or central embolus identified. Coronary artery calcifications are noted. There are coronary stents identified. Trace pericardial fluid. The heart is nonenlarged. The thoracic aorta has a normal course and caliber with mild atherosclerotic calcified plaque. There is a bovine type aortic arch, a normal variant. Mediastinum/Nodes: Large hiatal hernia. There is patulous fluid-filled esophagus in the thorax. Mild presumed mucosal enhancement within the distal esophagus posteriorly on series 4, image 52. No specific abnormal lymph node enlargement identified in the axillary region, hilum or mediastinum. Lungs/Pleura: Diffuse breathing motion. No consolidation, pneumothorax or effusion. There is some dependent atelectasis in the lung bases. Upper Abdomen: The adrenal glands are preserved in the upper abdomen. There is made of moderate debris in the stomach. Diffuse colonic stool. Left-sided colonic diverticula seen at the edge of the imaging field. Normal retrocecal appendix. Musculoskeletal: Mild degenerative changes seen along the spine. Review of the MIP images confirms the above findings. IMPRESSION: Large hiatal hernia. There  is a dilated fluid-filled esophagus. Submucosal hyperenhancement as well along the lower esophagus. Further workup when clinically appropriate. Significant breathing motion significantly limiting evaluation for pulmonary emboli. No large or central embolus. Trace pericardial fluid. Aortic Atherosclerosis (ICD10-I70.0). Electronically Signed   By: Karen Kays M.D.   On: 07/12/2022 16:49    Scheduled Meds:  sodium chloride   Intravenous Once   sodium chloride flush  3 mL Intravenous Q12H   Continuous Infusions:  lactated ringers 125 mL/hr at 07/13/22 0354   pantoprazole 8 mg/hr (07/13/22 0352)    LOS: 1 day   Marguerita Merles, DO [facility] Available via Epic secure chat 7am-7pm After these hours, please refer to coverage provider listed on [email or web address] 07/13/2022, 8:19 AM

## 2022-07-13 NOTE — Transfer of Care (Signed)
Immediate Anesthesia Transfer of Care Note  Patient: Sara [name]  Procedure(s) Performed: ESOPHAGOGASTRODUODENOSCOPY (EGD) WITH PROPOFOL  Patient Location: PACU  Anesthesia Type:General  Level of Consciousness: drowsy and patient cooperative  Airway & Oxygen Therapy: Patient Spontanous Breathing and Patient connected to face mask oxygen  Post-op Assessment: Report given to RN and Post -op Vital signs reviewed and stable  Post vital signs: Reviewed and stable  Last Vitals:  Vitals Value Taken Time  BP 112/49 07/13/22 1420  Temp 36.8 C 07/13/22 1420  Pulse 87 07/13/22 1423  Resp 23 07/13/22 1423  SpO2 99 % 07/13/22 1423  Vitals shown include unvalidated device data.  Last Pain:  Vitals:   07/13/22 1420  TempSrc: Temporal  PainSc: 0-No pain         Complications: No notable events documented.

## 2022-07-13 NOTE — Anesthesia Postprocedure Evaluation (Signed)
Anesthesia Post Note  Patient: Sara [name]  Procedure(s) Performed: ESOPHAGOGASTRODUODENOSCOPY (EGD) WITH PROPOFOL     Patient location during evaluation: PACU Anesthesia Type: General Level of consciousness: awake and alert Pain management: pain level controlled Vital Signs Assessment: post-procedure vital signs reviewed and stable Respiratory status: spontaneous breathing, nonlabored ventilation, respiratory function stable and patient connected to nasal cannula oxygen Cardiovascular status: blood pressure returned to baseline and stable Postop Assessment: no apparent nausea or vomiting Anesthetic complications: no   No notable events documented.  Last Vitals:  Vitals:   07/13/22 1420 07/13/22 1430  BP: (!) 112/49 (!) 101/56  Pulse: 86 87  Resp: (!) 24 18  Temp: 36.8 C   SpO2: 100% 93%    Last Pain:  Vitals:   07/13/22 1430  TempSrc:   PainSc: 0-No pain                 [name]

## 2022-07-14 DIAGNOSIS — I2510 Atherosclerotic heart disease of native coronary artery without angina pectoris: Secondary | ICD-10-CM | POA: Diagnosis not present

## 2022-07-14 DIAGNOSIS — N1831 Chronic kidney disease, stage 3a: Secondary | ICD-10-CM | POA: Diagnosis not present

## 2022-07-14 DIAGNOSIS — K922 Gastrointestinal hemorrhage, unspecified: Secondary | ICD-10-CM | POA: Diagnosis not present

## 2022-07-14 DIAGNOSIS — I471 Supraventricular tachycardia, unspecified: Secondary | ICD-10-CM

## 2022-07-14 DIAGNOSIS — N179 Acute kidney failure, unspecified: Secondary | ICD-10-CM | POA: Diagnosis not present

## 2022-07-14 LAB — CBC WITH DIFFERENTIAL/PLATELET
Abs Immature Granulocytes: 0.03 10*3/uL (ref 0.00–0.07)
Basophils Absolute: 0 10*3/uL (ref 0.0–0.1)
Basophils Relative: 0 %
Eosinophils Absolute: 0 10*3/uL (ref 0.0–0.5)
Eosinophils Relative: 0 %
HCT: 21.7 % — ABNORMAL LOW (ref 36.0–46.0)
Hemoglobin: 6.9 g/dL — CL (ref 12.0–15.0)
Immature Granulocytes: 0 %
Lymphocytes Relative: 14 %
Lymphs Abs: 1 10*3/uL (ref 0.7–4.0)
MCH: 30.8 pg (ref 26.0–34.0)
MCHC: 31.8 g/dL (ref 30.0–36.0)
MCV: 96.9 fL (ref 80.0–100.0)
Monocytes Absolute: 0.2 10*3/uL (ref 0.1–1.0)
Monocytes Relative: 3 %
Neutro Abs: 5.9 10*3/uL (ref 1.7–7.7)
Neutrophils Relative %: 83 %
Platelets: 151 10*3/uL (ref 150–400)
RBC: 2.24 MIL/uL — ABNORMAL LOW (ref 3.87–5.11)
RDW: 13.8 % (ref 11.5–15.5)
WBC: 7.1 10*3/uL (ref 4.0–10.5)
nRBC: 0 % (ref 0.0–0.2)

## 2022-07-14 LAB — BPAM RBC: ISSUE DATE / TIME: 202404210826

## 2022-07-14 LAB — TYPE AND SCREEN: Unit division: 0

## 2022-07-14 LAB — COMPREHENSIVE METABOLIC PANEL
ALT: 11 U/L (ref 0–44)
AST: 15 U/L (ref 15–41)
Albumin: 2.5 g/dL — ABNORMAL LOW (ref 3.5–5.0)
Alkaline Phosphatase: 37 U/L — ABNORMAL LOW (ref 38–126)
Anion gap: 4 — ABNORMAL LOW (ref 5–15)
BUN: 45 mg/dL — ABNORMAL HIGH (ref 8–23)
CO2: 24 mmol/L (ref 22–32)
Calcium: 8.2 mg/dL — ABNORMAL LOW (ref 8.9–10.3)
Chloride: 110 mmol/L (ref 98–111)
Creatinine, Ser: 1.79 mg/dL — ABNORMAL HIGH (ref 0.44–1.00)
GFR, Estimated: 29 mL/min — ABNORMAL LOW (ref >60)
Glucose, Bld: 131 mg/dL — ABNORMAL HIGH (ref 70–99)
Potassium: 4.5 mmol/L (ref 3.5–5.1)
Sodium: 138 mmol/L (ref 135–145)
Total Bilirubin: 0.4 mg/dL (ref 0.3–1.2)
Total Protein: 4.6 g/dL — ABNORMAL LOW (ref 6.5–8.1)

## 2022-07-14 LAB — MAGNESIUM: Magnesium: 2 mg/dL (ref 1.7–2.4)

## 2022-07-14 LAB — PREPARE RBC (CROSSMATCH)

## 2022-07-14 LAB — HEMOGLOBIN AND HEMATOCRIT, BLOOD
HCT: 25.4 % — ABNORMAL LOW (ref 36.0–46.0)
Hemoglobin: 8.4 g/dL — ABNORMAL LOW (ref 12.0–15.0)

## 2022-07-14 LAB — PHOSPHORUS: Phosphorus: 3.8 mg/dL (ref 2.5–4.6)

## 2022-07-14 MED ORDER — SODIUM CHLORIDE 0.9% IV SOLUTION: Freq: Once | INTRAVENOUS | Status: AC

## 2022-07-14 MED ORDER — PANTOPRAZOLE SODIUM 40 MG PO TBEC
40.0000 mg | DELAYED_RELEASE_TABLET | Freq: Two times a day (BID) | ORAL | Status: DC
  Administered 2022-07-14 – 2022-07-15 (×3): 40 mg via ORAL
  Filled 2022-07-14 (×3): qty 1

## 2022-07-14 MED ORDER — ROSUVASTATIN CALCIUM 5 MG PO TABS
5.0000 mg | ORAL_TABLET | Freq: Every day | ORAL | Status: DC
  Administered 2022-07-14 – 2022-07-15 (×2): 5 mg via ORAL
  Filled 2022-07-14 (×2): qty 1

## 2022-07-14 NOTE — Progress Notes (Signed)
Pt ambulated 150 ft while maintaining oxygen 97-98% with HR 100-102.

## 2022-07-14 NOTE — Progress Notes (Signed)
Subjective: LUQ discomfort after taking Plavix.  Objective: Vital signs in last 24 hours: Temp:  [97.5 F (36.4 C)-98.7 F (37.1 C)] 98.1 F (36.7 C) (04/21 1208) Pulse Rate:  [79-96] 79 (04/21 1208) Resp:  [16-18] 18 (04/21 1208) BP: (96-131)/(42-80) 102/64 (04/21 1208) SpO2:  [96 %-98 %] 97 % (04/21 1208) Last BM Date : 07/11/22  Intake/Output from previous day: 04/20 0701 - 04/21 0700 In: 1545.7 [P.O.:480; I.V.:1065.7] Out: 2700 [Urine:2700] Intake/Output this shift: Total I/O In: 698 [P.O.:360; Blood:338] Out: -   General appearance: alert and no distress GI: mild LUQ tenderness  Lab Results: Recent Labs    07/12/22 1348 07/12/22 1723 07/13/22 1010 07/14/22 0354  WBC 11.4*  --  7.8 7.1  HGB 11.2* 9.6* 7.9* 6.9*  HCT 34.7* 30.3* 24.3* 21.7*  PLT 270  --  186 151   BMET Recent Labs    07/12/22 1348 07/13/22 0438 07/14/22 0354  NA 138 140 138  K 5.2* 5.0 4.5  CL 106 109 110  CO2 GLUCOSE 141* 95 131*  BUN 44* 66* 45*  CREATININE 1.75* 1.58* 1.79*  CALCIUM 8.6* 8.3* 8.2*   LFT Recent Labs    07/13/22 1010 07/14/22 0354  PROT 4.8* 4.6*  ALBUMIN 2.6* 2.5*  AST 14* 15  ALT 11 11  ALKPHOS 39 37*  BILITOT 0.7 0.4  BILIDIR <0.1  --   IBILI NOT CALCULATED  --    PT/INR No results for input(s): "LABPROT", "INR" in the last 72 hours. Hepatitis Panel No results for input(s): "HEPBSAG", "HCVAB", "HEPAIGM", "HEPBIGM" in the last 72 hours. C-Diff No results for input(s): "CDIFFTOX" in the last 72 hours. Fecal Lactopherrin No results for input(s): "FECLLACTOFRN" in the last 72 hours.  Studies/Results: DG Abd Portable 1 View  Result Date: 07/12/2022 CLINICAL DATA:  NG placement. EXAM: PORTABLE ABDOMEN - 1 VIEW COMPARISON:  Chest CT dated 07/12/2022. FINDINGS: Enteric tube with side port and tip below the diaphragm over the epigastric area likely in the body of the stomach. A hiatal hernia again noted and better seen on the CT. IMPRESSION:  Enteric tube with tip likely in the body of the stomach. Electronically Signed   By: Elgie Collard M.D.   On: 07/12/2022 20:24   CT Angio Chest PE W and/or Wo Contrast  Result Date: 07/12/2022 CLINICAL DATA:  Recent coronary stent. Shortness of breath. Coughing up blood. EXAM: CT ANGIOGRAPHY CHEST WITH CONTRAST TECHNIQUE: Multidetector CT imaging of the chest was performed using the standard protocol during bolus administration of intravenous contrast. Multiplanar CT image reconstructions and MIPs were obtained to evaluate the vascular anatomy. RADIATION DOSE REDUCTION: This exam was performed according to the departmental dose-optimization program which includes automated exposure control, adjustment of the mA and/or kV according to patient size and/or use of iterative reconstruction technique. CONTRAST:  50mL OMNIPAQUE IOHEXOL 350 MG/ML SOLN COMPARISON:  None Available. FINDINGS: Cardiovascular: There is significant breathing motion. This limits evaluation for pulmonary emboli. Nondiagnostic for small and peripheral emboli. No large or central embolus identified. Coronary artery calcifications are noted. There are coronary stents identified. Trace pericardial fluid. The heart is nonenlarged. The thoracic aorta has a normal course and caliber with mild atherosclerotic calcified plaque. There is a bovine type aortic arch, a normal variant. Mediastinum/Nodes: Large hiatal hernia. There is patulous fluid-filled esophagus in the thorax. Mild presumed mucosal enhancement within the distal esophagus posteriorly on series 4, image 52. No specific abnormal lymph node enlargement identified in the axillary  region, hilum or mediastinum. Lungs/Pleura: Diffuse breathing motion. No consolidation, pneumothorax or effusion. There is some dependent atelectasis in the lung bases. Upper Abdomen: The adrenal glands are preserved in the upper abdomen. There is made of moderate debris in the stomach. Diffuse colonic stool.  Left-sided colonic diverticula seen at the edge of the imaging field. Normal retrocecal appendix. Musculoskeletal: Mild degenerative changes seen along the spine. Review of the MIP images confirms the above findings. IMPRESSION: Large hiatal hernia. There is a dilated fluid-filled esophagus. Submucosal hyperenhancement as well along the lower esophagus. Further workup when clinically appropriate. Significant breathing motion significantly limiting evaluation for pulmonary emboli. No large or central embolus. Trace pericardial fluid. Aortic Atherosclerosis (ICD10-I70.0). Electronically Signed   By: Karen Kays M.D.   On: 07/12/2022 16:49    Medications: Scheduled:  sodium chloride   Intravenous Once   clopidogrel  75 mg Oral Daily   pantoprazole  40 mg Oral BID   sodium chloride flush  3 mL Intravenous Q12H   Continuous:  Assessment/Plan: 1) Clean-based benign esophageal ulcer. 2) Barrett's esophagus. 3) CAD s/p DES.   The patient was able to discern a recurrent LUQ pain after being restarted on Plavix.  This may be the source of her pain rather than the esophageal ulcer.  She remains anemic and it is felt to be equilibration.  There was no evidence of any bleeding with the EGD one day off of Plavix.  The patient denies any gross evidence of bleeding.  Plan: 1) Pantoprazole BID. 2) Follow HGB. 3) Agree with the transfusion. 4) ? Changing to another anticoagulation.  This will be deferred to Cardiology. 5) [facility] to resume care in the AM.   LOS: 2 days   Renley Banwart D 07/14/2022, 3:44 PM

## 2022-07-14 NOTE — Progress Notes (Signed)
PROGRESS NOTE    Sara [name]  YNW:[id number] DOB: September 23, 1945 DOA: 07/12/2022 PCP: [name], Raymond J, MD   Brief Narrative:  HPI per Dr. Merian Capron on 07/12/22 HPI: Sara [name] is a 77 y.o. female with medical history significant for CAD status post DES placement to RCA in 04/2022, hiatal hernia status post surgical repair, CKD (baseline creatinine 1.1), SVT, OA, who presents after having bloody emesis at home.   Patient reports that she has been dealing with a pain in her epigastric to left-sided chest region for a bit now.  She has been seeing her PCP, cardiologist, and gastroenterologist to work this issue up.  Earlier today she began to feel unwell, she felt nauseous and had emesis of what looked to her like red Jell-O.  This was alarming to her and so she presented to the ED.  Patient reports that she is compliant with all of her medications.  She recently received a drug-eluting stent 2 months ago and has been taking Plavix regularly.  She has CKD and so she does not take NSAIDs, does not drink significant amounts of alcohol.   In the ED initial vital signs notable for mild tachycardia, low normal blood pressures.  Initial CBC showed drop in hemoglobin to 11.2 down from 14.2 a month ago, and it subsequently dropped 4 hours later to 9.6 on recheck.  BMP notable for creatinine of 1.7 (baseline 1.1).  Serial troponins were normal.  CTPA was obtained to rule out pulmonary embolism, significant motion artifact but no large pulmonary embolism seen.  However a large hiatal hernia along with a dilated and fluid-filled esophagus was seen. EKG showed sinus tachycardia, no other acute findings.  Type and screen was obtained, she was started on an IV PPI gtt., and gastroenterology was consulted.  During her stay in the ED she had emesis of an additional 450 mL of what appeared to be hematemesis.   She was admitted for further management and workup.   **Interim History Underwent EGD and  found to have an esophageal ulcer with no stigmata for bleeding.  GI recommends changing to PPI twice daily and resuming Plavix 07/13/2022 and monitor any further signs and symptoms of bleeding.  Unfortunately her blood count dropped further had no active signs and symptoms bleeding and GI thinks it is in coloration so she is typed and screened and transfused 1 unit of PRBCs  Assessment and Plan: *UGIB (upper gastrointestinal bleed) in the setting of esophageal ulcer ABLA -Likely due to combination of her Plavix therapy for recent cardiac stent as well as her known large hiatal hernia.  Gastroenterology have evaluated patient and are planning on endoscopy in the morning. -Clear liquids for now, n.p.o. at midnight -IVF now stopped  -Hgb/Hct Trend: Recent Labs  Lab 07/12/22 1348 07/12/22 1723 07/13/22 1010 07/14/22 0354 07/14/22 1500  HGB 11.2* 9.6* 7.9* 6.9* 8.4*  HCT 34.7* 30.3* 24.3* 21.7* 25.4*  MCV 95.9  --  96.4 96.9  --   -Trended Hgb/Hct q6h -Anemia panel was showing iron of 161, UIBC of 164, TIBC of 225, saturation ratios of 27%, ferritin level 51, folate level 21.1, vitamin B12 454 -Continued IV PPI gtt and change to po BID PPI -Held aspirin and Plavix pending EGD and see below. Now her Plavix has been resumed -With cardiology Dr. Izora Ribas who recommends resuming aspirin at the minimum once okay with GI and recommending deferring resuming Plavix to GI and safe - Given significant ongoing drop in hemoglobin, will  type and cross for 2 units, per GI target hemoglobin greater than 8 -EGD done and showed an esophageal ulcer with no stigmata of recent bleeding as well as a 5 cm hiatal hernia as well as a gastro esophageal flap valve classified as Hill grade 4 and esophageal mucosal changes classified as Barrett's stage C 13-M15 per Prague criteria as well as a normal stomach and normal examined duodenum and no specimens collected -GI recommends resuming Plavix 07/12/20 and  pantoprazole PPI 4 mg twice daily -Count had dropped but GI feels that there is no evidence of very gross bleeding and felt that the low blood count was felt to be equilibration   AKI (acute kidney injury) on CKD Stage 3a -Likely secondary to combination of acute blood loss anemia and poor p.o. intake in setting of acute illness.   -Baseline Cr is 1.1 -Status post 1 L bolus in ED, will be on maintenance fluids overnight, trend creatinine. -BUN/Cr Trend: Recent Labs  Lab 07/12/22 1348 07/13/22 0438 07/14/22 0354  BUN 44* 66* 45*  CREATININE 1.75* 1.58* 1.79*  -IVF now stopped and getting blood -Avoid Nephrotoxic Medications, Contrast Dyes, Hypotension and Dehydration to Ensure Adequate Renal Perfusion and will need to Renally Adjust Meds -Continue to Monitor and Trend Renal Function carefully and repeat CMP in the AM     Hyperkalemia -K+ Trend Recent Labs  Lab 07/12/22 1348 07/13/22 0438 07/14/22 0354  K 5.2* 5.0 4.5  -Continue to Monitor and Trend and repeat CMP in the AM   Leukocytosis -WBC Trend: Recent Labs  Lab 07/12/22 1348 07/13/22 1010 07/14/22 0354  WBC 11.4* 7.8 7.1  -Continue to Monitor and Trend and repeat CBC in the AM   Hypertension -Held Verapamil in setting of low normal blood pressures and held Losartan in setting of AKI. -Continue to Monitor BP per Protocol  -Last BP reading was 102/64   CAD s/p DES placement to the RCA in 05/18/22 -Held aspirin and Plavix now and resume as soon as possible per GI recommendations given her recent stenting and after EGD GI recommends resuming Plavix 75 mg po Daily  -Resume Statin; Hold Losartan and Verapamil    HLD -Resume Rosuvastatin 5 mg po Daily    Hypoalbuminemia -Patient's Albumin Trend: Recent Labs  Lab 07/13/22 1010 07/14/22 0354  ALBUMIN 2.6* 2.5*  -Continue to Monitor and Trend and repeat CMP in the AM  DVT prophylaxis: SCDs Start: 07/12/22 1958    Code Status: Full Code Family Communication: No  family currently at bedside  Disposition Plan:  Level of care: Progressive Status is: Inpatient Remains inpatient appropriate because: Needs further clinical improvement and clearance by the specialists   Consultants:  Gastroenterology  Procedures:  As delineated as above  EGD Findings:      One superficial esophageal ulcer with no stigmata of recent bleeding was       found 30 cm from the incisors. The lesion was 10 mm in largest dimension.      A 5 cm hiatal hernia was present.      The gastroesophageal flap valve was visualized endoscopically and       classified as Hill Grade IV (no fold, wide open lumen, hiatal hernia       present).      There were esophageal mucosal changes classified as Barrett's stage       C13-M15 per Prague criteria present in the upper third of the esophagus,       in the middle third  of the esophagus and in the lower third of the       esophagus. The maximum longitudinal extent of these mucosal changes was       15 cm in length.      The stomach was normal.      The examined duodenum was normal.      Provation failed to capture all the images. From the top of the gastric       folds (37 cm) to the Z-line (24 cm) a Barrett's esophagus was noted. The       maximal extent of the Barrett's was noted at 22 cm. There was no       evidence of any masses or nodularity in the Barrett's esophagus. The       ulcer in the mid esophagus was clean-based. Impression:               - Esophageal ulcer with no stigmata of recent                            bleeding.                           - 5 cm hiatal hernia.                           - Gastroesophageal flap valve classified as Hill                            Grade IV (no fold, wide open lumen, hiatal hernia                            present).                           - Esophageal mucosal changes classified as                            Barrett's stage C13-M15 per Prague criteria.                           -  Normal stomach.                           - Normal examined duodenum.                           - No specimens collected.  Antimicrobials:  Anti-infectives (From admission, onward)    None       Subjective: Seen and examined at bedside and was happy that her NG tube is out.  States that she had some slight pain under her left breast after her biopsy was started and is the same type "twinge" that she is having.  Denies any nausea or vomiting.  No more bloody bowel movements noted.  No other concerns or complaints at this time.  Objective: Vitals:   07/14/22 0820 07/14/22 0852 07/14/22 1130 07/14/22 1208  BP: (!) 104/47 (!) 103/54 110/60 102/64  Pulse: 87 81 80 79  Resp: 18 18 16 18   Temp: 98.7 F (37.1 C) 98.6 F (37 C)  98.3 F (36.8 C) 98.1 F (36.7 C)  TempSrc: Oral Oral Oral Oral  SpO2: 98% 98% 96% 97%  Weight:      Height:        Intake/Output Summary (Last 24 hours) at 07/14/2022 1603 Last data filed at 07/14/2022 1430 Gross per 24 hour  Intake 1532.64 ml  Output 1500 ml  Net 32.64 ml   Filed Weights   07/12/22 1348  Weight: 74.4 kg   Examination: Physical Exam:  Constitutional: WN/WD overweight Caucasian female in no acute distress Respiratory: Diminished to auscultation bilaterally, no wheezing, rales, rhonchi or crackles. Normal respiratory effort and patient is not tachypenic. No accessory muscle use.  Unlabored breathing Cardiovascular: RRR, no murmurs / rubs / gallops. S1 and S2 auscultated.  Abdomen: Soft, non-tender, mildly distended.  Bowel sounds positive.  GU: Deferred. Musculoskeletal: No clubbing / cyanosis of digits/nails. No joint deformity upper and lower extremities.  Skin: No rashes, lesions, ulcers on limited skin evaluation. No induration; Warm and dry.  Neurologic: CN 2-12 grossly intact with no focal deficits.  Romberg sign and cerebellar reflexes not assessed.  Psychiatric: Normal judgment and insight. Alert and oriented x 3. Normal mood  and appropriate affect.   Data Reviewed: I have personally reviewed following labs and imaging studies  CBC: Recent Labs  Lab 07/12/22 1348 07/12/22 1723 07/13/22 1010 07/14/22 0354 07/14/22 1500  WBC 11.4*  --  7.8 7.1  --   NEUTROABS  --   --  4.8 5.9  --   HGB 11.2* 9.6* 7.9* 6.9* 8.4*  HCT 34.7* 30.3* 24.3* 21.7* 25.4*  MCV 95.9  --  96.4 96.9  --   PLT 270  --  186 151  --    Basic Metabolic Panel: Recent Labs  Lab 07/12/22 1348 07/13/22 0438 07/13/22 1010 07/14/22 0354  NA 138 140  --  138  K 5.2* 5.0  --  4.5  CL 106 109  --  110  CO2 23 24  --  24  GLUCOSE 141* 95  --  131*  BUN 44* 66*  --  45*  CREATININE 1.75* 1.58*  --  1.79*  CALCIUM 8.6* 8.3*  --  8.2*  MG  --   --  2.2 2.0  PHOS  --   --  3.2 3.8   GFR: Estimated Creatinine Clearance: 28.4 mL/min (A) (by C-G formula based on SCr of 1.79 mg/dL (H)). Liver Function Tests: Recent Labs  Lab 07/13/22 1010 07/14/22 0354  AST 14* 15  ALT 11 11  ALKPHOS 39 37*  BILITOT 0.7 0.4  PROT 4.8* 4.6*  ALBUMIN 2.6* 2.5*   No results for input(s): "LIPASE", "AMYLASE" in the last 168 hours. No results for input(s): "AMMONIA" in the last 168 hours. Coagulation Profile: No results for input(s): "INR", "PROTIME" in the last 168 hours. Cardiac Enzymes: No results for input(s): "CKTOTAL", "CKMB", "CKMBINDEX", "TROPONINI" in the last 168 hours. BNP (last 3 results) Recent Labs    02/27/22 1435  PROBNP 203   HbA1C: No results for input(s): "HGBA1C" in the last 72 hours. CBG: Recent Labs  Lab 07/12/22 1409  GLUCAP 135*   Lipid Profile: No results for input(s): "CHOL", "HDL", "LDLCALC", "TRIG", "CHOLHDL", "LDLDIRECT" in the last 72 hours. Thyroid Function Tests: No results for input(s): "TSH", "T4TOTAL", "FREET4", "T3FREE", "THYROIDAB" in the last 72 hours. Anemia Panel: Recent Labs    07/13/22 1010  VITAMINB12 454  FOLATE 21.1  FERRITIN 51  TIBC 225*  IRON 61  RETICCTPCT 0.9   Sepsis Labs: No  results for input(s): "PROCALCITON", "LATICACIDVEN" in the last 168 hours.  No results found for this or any previous visit (from the past 240 hour(s)).   Radiology Studies: DG Abd Portable 1 View  Result Date: 07/12/2022 CLINICAL DATA:  NG placement. EXAM: PORTABLE ABDOMEN - 1 VIEW COMPARISON:  Chest CT dated 07/12/2022. FINDINGS: Enteric tube with side port and tip below the diaphragm over the epigastric area likely in the body of the stomach. A hiatal hernia again noted and better seen on the CT. IMPRESSION: Enteric tube with tip likely in the body of the stomach. Electronically Signed   By: Elgie Collard M.D.   On: 07/12/2022 20:24   CT Angio Chest PE W and/or Wo Contrast  Result Date: 07/12/2022 CLINICAL DATA:  Recent coronary stent. Shortness of breath. Coughing up blood. EXAM: CT ANGIOGRAPHY CHEST WITH CONTRAST TECHNIQUE: Multidetector CT imaging of the chest was performed using the standard protocol during bolus administration of intravenous contrast. Multiplanar CT image reconstructions and MIPs were obtained to evaluate the vascular anatomy. RADIATION DOSE REDUCTION: This exam was performed according to the departmental dose-optimization program which includes automated exposure control, adjustment of the mA and/or kV according to patient size and/or use of iterative reconstruction technique. CONTRAST:  50mL OMNIPAQUE IOHEXOL 350 MG/ML SOLN COMPARISON:  None Available. FINDINGS: Cardiovascular: There is significant breathing motion. This limits evaluation for pulmonary emboli. Nondiagnostic for small and peripheral emboli. No large or central embolus identified. Coronary artery calcifications are noted. There are coronary stents identified. Trace pericardial fluid. The heart is nonenlarged. The thoracic aorta has a normal course and caliber with mild atherosclerotic calcified plaque. There is a bovine type aortic arch, a normal variant. Mediastinum/Nodes: Large hiatal hernia. There is  patulous fluid-filled esophagus in the thorax. Mild presumed mucosal enhancement within the distal esophagus posteriorly on series 4, image 52. No specific abnormal lymph node enlargement identified in the axillary region, hilum or mediastinum. Lungs/Pleura: Diffuse breathing motion. No consolidation, pneumothorax or effusion. There is some dependent atelectasis in the lung bases. Upper Abdomen: The adrenal glands are preserved in the upper abdomen. There is made of moderate debris in the stomach. Diffuse colonic stool. Left-sided colonic diverticula seen at the edge of the imaging field. Normal retrocecal appendix. Musculoskeletal: Mild degenerative changes seen along the spine. Review of the MIP images confirms the above findings. IMPRESSION: Large hiatal hernia. There is a dilated fluid-filled esophagus. Submucosal hyperenhancement as well along the lower esophagus. Further workup when clinically appropriate. Significant breathing motion significantly limiting evaluation for pulmonary emboli. No large or central embolus. Trace pericardial fluid. Aortic Atherosclerosis (ICD10-I70.0). Electronically Signed   By: Karen Kays M.D.   On: 07/12/2022 16:49    Scheduled Meds:  sodium chloride   Intravenous Once   clopidogrel  75 mg Oral Daily   pantoprazole  40 mg Oral BID   sodium chloride flush  3 mL Intravenous Q12H   Continuous Infusions:   LOS: 2 days   Marguerita Merles, DO [facility] Available via Epic secure chat 7am-7pm After these hours, please refer to coverage provider listed on [email or web address] 07/14/2022, 4:03 PM

## 2022-07-14 NOTE — Progress Notes (Signed)
    Patient Name: Sara [name]           DOB: 06-01-1945  MRN: [id number]      Admission Date: 07/12/2022  Attending Provider: Merlene Laughter, DO  Primary Diagnosis: UGIB (upper gastrointestinal bleed)   Level of care: Progressive    CROSS COVER NOTE   Date of Service   07/14/2022   Sara [name], 77 y.o. female, was admitted on 07/12/2022 for UGIB (upper gastrointestinal bleed).    HPI/Events of Note   RN reports hemoglobin 6.9.  No episodes of blood loss reported. Per GI, target hemoglobin> 8.  Orders placed for 1 unit PRBC.    Interventions/ Plan   Blood transfusion        Sara Harada, DNP, ACNPC- AG [facility] [place]

## 2022-07-15 ENCOUNTER — Encounter (HOSPITAL_COMMUNITY): Payer: Self-pay | Admitting: Gastroenterology

## 2022-07-15 ENCOUNTER — Other Ambulatory Visit (HOSPITAL_BASED_OUTPATIENT_CLINIC_OR_DEPARTMENT_OTHER): Payer: Self-pay

## 2022-07-15 DIAGNOSIS — N179 Acute kidney failure, unspecified: Secondary | ICD-10-CM | POA: Diagnosis not present

## 2022-07-15 DIAGNOSIS — N1831 Chronic kidney disease, stage 3a: Secondary | ICD-10-CM | POA: Diagnosis not present

## 2022-07-15 DIAGNOSIS — I2510 Atherosclerotic heart disease of native coronary artery without angina pectoris: Secondary | ICD-10-CM | POA: Diagnosis not present

## 2022-07-15 DIAGNOSIS — K922 Gastrointestinal hemorrhage, unspecified: Secondary | ICD-10-CM | POA: Diagnosis not present

## 2022-07-15 LAB — TYPE AND SCREEN: ABO/RH(D): O POS

## 2022-07-15 LAB — HEMOGLOBIN AND HEMATOCRIT, BLOOD
HCT: 25.8 % — ABNORMAL LOW (ref 36.0–46.0)
Hemoglobin: 8.4 g/dL — ABNORMAL LOW (ref 12.0–15.0)

## 2022-07-15 LAB — COMPREHENSIVE METABOLIC PANEL
ALT: 11 U/L (ref 0–44)
AST: 19 U/L (ref 15–41)
Albumin: 2.8 g/dL — ABNORMAL LOW (ref 3.5–5.0)
Alkaline Phosphatase: 52 U/L (ref 38–126)
Anion gap: 6 (ref 5–15)
BUN: 40 mg/dL — ABNORMAL HIGH (ref 8–23)
CO2: 26 mmol/L (ref 22–32)
Calcium: 8.5 mg/dL — ABNORMAL LOW (ref 8.9–10.3)
Chloride: 108 mmol/L (ref 98–111)
Creatinine, Ser: 1.57 mg/dL — ABNORMAL HIGH (ref 0.44–1.00)
GFR, Estimated: 34 mL/min — ABNORMAL LOW (ref >60)
Glucose, Bld: 93 mg/dL (ref 70–99)
Potassium: 4.3 mmol/L (ref 3.5–5.1)
Sodium: 140 mmol/L (ref 135–145)
Total Bilirubin: 0.4 mg/dL (ref 0.3–1.2)
Total Protein: 4.8 g/dL — ABNORMAL LOW (ref 6.5–8.1)

## 2022-07-15 LAB — CBC WITH DIFFERENTIAL/PLATELET
Abs Immature Granulocytes: 0.03 10*3/uL (ref 0.00–0.07)
Basophils Absolute: 0 10*3/uL (ref 0.0–0.1)
Basophils Relative: 1 %
Eosinophils Absolute: 0.1 10*3/uL (ref 0.0–0.5)
Eosinophils Relative: 1 %
HCT: 25.6 % — ABNORMAL LOW (ref 36.0–46.0)
Hemoglobin: 8.4 g/dL — ABNORMAL LOW (ref 12.0–15.0)
Immature Granulocytes: 0 %
Lymphocytes Relative: 37 %
Lymphs Abs: 3.2 10*3/uL (ref 0.7–4.0)
MCH: 31.1 pg (ref 26.0–34.0)
MCHC: 32.8 g/dL (ref 30.0–36.0)
MCV: 94.8 fL (ref 80.0–100.0)
Monocytes Absolute: 0.6 10*3/uL (ref 0.1–1.0)
Monocytes Relative: 7 %
Neutro Abs: 4.7 10*3/uL (ref 1.7–7.7)
Neutrophils Relative %: 54 %
Platelets: 160 10*3/uL (ref 150–400)
RBC: 2.7 MIL/uL — ABNORMAL LOW (ref 3.87–5.11)
RDW: 14.6 % (ref 11.5–15.5)
WBC: 8.6 10*3/uL (ref 4.0–10.5)
nRBC: 0 % (ref 0.0–0.2)

## 2022-07-15 LAB — BPAM RBC
Blood Product Expiration Date: 202405202359
Unit Type and Rh: 5100

## 2022-07-15 LAB — MAGNESIUM: Magnesium: 1.8 mg/dL (ref 1.7–2.4)

## 2022-07-15 LAB — PHOSPHORUS: Phosphorus: 3.6 mg/dL (ref 2.5–4.6)

## 2022-07-15 MED ORDER — POLYETHYLENE GLYCOL 3350 17 GM/SCOOP PO POWD
17.0000 g | Freq: Every day | ORAL | 0 refills | Status: DC | PRN
  Filled 2022-07-15: qty 238, 14d supply, fill #0

## 2022-07-15 MED ORDER — ONDANSETRON HCL 4 MG PO TABS
4.0000 mg | ORAL_TABLET | Freq: Four times a day (QID) | ORAL | 0 refills | Status: DC | PRN
  Filled 2022-07-15: qty 20, 5d supply, fill #0

## 2022-07-15 MED ORDER — PANTOPRAZOLE SODIUM 40 MG PO TBEC
40.0000 mg | DELAYED_RELEASE_TABLET | Freq: Two times a day (BID) | ORAL | 0 refills | Status: DC
  Filled 2022-07-15: qty 60, 30d supply, fill #0

## 2022-07-15 MED ORDER — ACETAMINOPHEN 325 MG PO TABS
650.0000 mg | ORAL_TABLET | Freq: Four times a day (QID) | ORAL | 0 refills | Status: DC | PRN
  Filled 2022-07-15: qty 100, 13d supply, fill #0

## 2022-07-15 NOTE — Progress Notes (Addendum)
[facility] Progress Note  CC:  Hematemesis   Subjective: She had mild nausea and a headache early this morning which abated. She ate a regular diet for breakfast. She passed a normal brown stool yesterday. No black stool or rectal bleeding. No BM thus far today. No CP or SOB.    Objective:   EGD by Dr. Elnoria Howard 07/13/2022: - Esophageal ulcer with no stigmata of recent bleeding.  - 5 cm hiatal hernia.  - Gastroesophageal flap valve classified as Hill Grade IV (no fold, wide open lumen, hiatal hernia present).  - Esophageal mucosal changes classified as Barrett's stage C13-M15 per Prague criteria.  - Normal stomach.  - Normal examined duodenum.  - No specimens collected.  Vital signs in last 24 hours: Temp:  [98.1 F (36.7 C)-98.7 F (37.1 C)] 98.2 F (36.8 C) (04/22 0454) Pulse Rate:  [58-87] 58 (04/22 0454) Resp:  [16-18] 17 (04/22 0454) BP: (102-111)/(47-64) 111/59 (04/22 0454) SpO2:  [96 %-99 %] 99 % (04/22 0454) Last BM Date : 07/14/22 General: 77 year old female in NAD.  Heart: Slightly irregular rhythm. No murmur.  Pulm: Breath sounds clear throughout.  Abdomen: Soft, nondistended. Nontender. + BS x 4 quads.  Extremities: No edema. Neurologic:  Alert and  oriented x 4. Grossly normal neurologically. Psych:  Alert and cooperative. Normal mood and affect.  Intake/Output from previous day: 04/21 0701 - 04/22 0700 In: 1505 [P.O.:1167; Blood:338] Out: 1775 [Urine:1775] Intake/Output this shift: No intake/output data recorded.  Lab Results: Recent Labs    07/13/22 1010 07/14/22 0354 07/14/22 1500 07/15/22 0415  WBC 7.8 7.1  --  8.6  HGB 7.9* 6.9* 8.4* 8.4*  HCT 24.3* 21.7* 25.4* 25.6*  PLT 186 151  --  160   BMET Recent Labs    07/13/22 0438 07/14/22 0354 07/15/22 0415  NA 140 138 140  K 5.0 4.5 4.3  CL 109 110 108  CO2 GLUCOSE 95 131* 93  BUN 66* 45* 40*  CREATININE 1.58* 1.79* 1.57*  CALCIUM 8.3* 8.2* 8.5*   LFT Recent  Labs    07/13/22 1010 07/14/22 0354 07/15/22 0415  PROT 4.8*   < > 4.8*  ALBUMIN 2.6*   < > 2.8*  AST 14*   < > 19  ALT 11   < > 11  ALKPHOS 39   < > 52  BILITOT 0.7   < > 0.4  BILIDIR <0.1  --   --   IBILI NOT CALCULATED  --   --    < > = values in this interval not displayed.   PT/INR No results for input(s): "LABPROT", "INR" in the last 72 hours. Hepatitis Panel No results for input(s): "HEPBSAG", "HCVAB", "HEPAIGM", "HEPBIGM" in the last 72 hours.  No results found.  Assessment / Plan:  77 year old female admitted  to the hospital 07/12/2022 with hematemesis, LUQ pain and presyncope. Admission Hg 11.2 down from baseline Hg 14.2 with elevated BUN 44. EGD 07/13/2022 identified a 5 cm hiatal hernia, and esophageal ulcer without stigmata of recent bleeding and evidence of Barrett's esophagus. Stomach and duodenum were normal. On PPI bid. Plavix restarted post EGD. Hg 7.9 -> 6.9 on 4/21 -> transfused one unit of PRBCs -> Hg 8.4 -> today Hg 8.4.  Normal brown BM yesterday. Mild nausea this am without vomiting. Hemodynamically stable.  -Continue PPI po bid -Ondansetron  po or IV Q 6hrs PRN -Check H/H at noon -Transfuse for Hg <  8 -Monitor the patient closely for GI bleeding -Diet as tolerated -She is scheduled to see Dr. Chales Abrahams in our GI clinic 07/17/2022 -Possibly may go home today if Hg level remains stable   GERD, Barrett's esophagus, hiatal hernia s/p fundoplication  -Continue PPI po bid -Outpatient GI follow up as noted above  CAD s/p DES placement to the RCA 04/2022 on Plavix   CKD stage III     LOS: 3 days   Arnaldo Natal  07/15/2022, 9:15AM     [facility] Attending   I have taken an interval history, reviewed the chart and seen the patient. I agree with the Advanced Practitioner's note, impression and recommendations. Lab Results  Component Value Date   HGB 8.4 (L) 07/15/2022   She is stable and ready for dc  Given cardiac issues would Rx w/ ASA  and clopidogrel + bid PPI  Iva Boop, MD, [facility] See Loretha Stapler on call - gastroenterology for best contact person 07/15/2022 2:31 PM

## 2022-07-15 NOTE — TOC CM/SW Note (Signed)
  Transition of Care [facility]) Screening Note   Patient Details  Name: Sara [name] Date of Birth: 24-May-1945   Transition of Care [facility]) CM/SW Contact:    Howell Rucks, RN Phone Number: 07/15/2022, 9:01 AM    Transition of Care Department [facility]) has reviewed patient and no TOC needs have been identified at this time. We will continue to monitor patient advancement through interdisciplinary progression rounds. If new patient transition needs arise, please place a TOC consult.

## 2022-07-16 ENCOUNTER — Encounter (HOSPITAL_COMMUNITY): Payer: Self-pay | Admitting: *Deleted

## 2022-07-16 ENCOUNTER — Emergency Department (HOSPITAL_COMMUNITY)
Admission: EM | Admit: 2022-07-16 | Discharge: 2022-07-16 | Disposition: A | Discharge status: Home / Self Care | Payer: Medicare Other | Attending: Emergency Medicine | Admitting: Emergency Medicine

## 2022-07-16 ENCOUNTER — Telehealth: Payer: Self-pay

## 2022-07-16 ENCOUNTER — Other Ambulatory Visit: Payer: Self-pay

## 2022-07-16 DIAGNOSIS — Z7982 Long term (current) use of aspirin: Secondary | ICD-10-CM | POA: Insufficient documentation

## 2022-07-16 DIAGNOSIS — K921 Melena: Secondary | ICD-10-CM | POA: Diagnosis not present

## 2022-07-16 DIAGNOSIS — Z8719 Personal history of other diseases of the digestive system: Secondary | ICD-10-CM

## 2022-07-16 DIAGNOSIS — Z7902 Long term (current) use of antithrombotics/antiplatelets: Secondary | ICD-10-CM | POA: Insufficient documentation

## 2022-07-16 LAB — COMPREHENSIVE METABOLIC PANEL
ALT: 16 U/L (ref 0–44)
AST: 23 U/L (ref 15–41)
Albumin: 3.6 g/dL (ref 3.5–5.0)
Alkaline Phosphatase: 58 U/L (ref 38–126)
Anion gap: 8 (ref 5–15)
BUN: 26 mg/dL — ABNORMAL HIGH (ref 8–23)
CO2: 25 mmol/L (ref 22–32)
Calcium: 8.4 mg/dL — ABNORMAL LOW (ref 8.9–10.3)
Chloride: 102 mmol/L (ref 98–111)
Creatinine, Ser: 1.53 mg/dL — ABNORMAL HIGH (ref 0.44–1.00)
GFR, Estimated: 35 mL/min — ABNORMAL LOW (ref >60)
Glucose, Bld: 101 mg/dL — ABNORMAL HIGH (ref 70–99)
Potassium: 3.3 mmol/L — ABNORMAL LOW (ref 3.5–5.1)
Sodium: 135 mmol/L (ref 135–145)
Total Bilirubin: 0.5 mg/dL (ref 0.3–1.2)
Total Protein: 6.4 g/dL — ABNORMAL LOW (ref 6.5–8.1)

## 2022-07-16 LAB — CBC WITH DIFFERENTIAL/PLATELET
Abs Immature Granulocytes: 0.04 10*3/uL (ref 0.00–0.07)
Basophils Absolute: 0 10*3/uL (ref 0.0–0.1)
Basophils Relative: 0 %
Eosinophils Absolute: 0.3 10*3/uL (ref 0.0–0.5)
Eosinophils Relative: 3 %
HCT: 28.3 % — ABNORMAL LOW (ref 36.0–46.0)
Hemoglobin: 9.4 g/dL — ABNORMAL LOW (ref 12.0–15.0)
Immature Granulocytes: 0 %
Lymphocytes Relative: 25 %
Lymphs Abs: 2.5 10*3/uL (ref 0.7–4.0)
MCH: 30.9 pg (ref 26.0–34.0)
MCHC: 33.2 g/dL (ref 30.0–36.0)
MCV: 93.1 fL (ref 80.0–100.0)
Monocytes Absolute: 0.7 10*3/uL (ref 0.1–1.0)
Monocytes Relative: 7 %
Neutro Abs: 6.2 10*3/uL (ref 1.7–7.7)
Neutrophils Relative %: 65 %
Platelets: 236 10*3/uL (ref 150–400)
RBC: 3.04 MIL/uL — ABNORMAL LOW (ref 3.87–5.11)
RDW: 13.9 % (ref 11.5–15.5)
WBC: 9.8 10*3/uL (ref 4.0–10.5)
nRBC: 0 % (ref 0.0–0.2)

## 2022-07-16 LAB — TYPE AND SCREEN
ABO/RH(D): O POS
Antibody Screen: NEGATIVE
Antibody Screen: NEGATIVE
Unit division: 0

## 2022-07-16 LAB — BPAM RBC
Blood Product Expiration Date: 202405182359
Unit Type and Rh: 5100

## 2022-07-16 NOTE — Transitions of Care (Post Inpatient/ED Visit) (Signed)
07/16/2022  Name: Sara [name] MRN: [id number] DOB: 02-01-1946  Today's TOC FU Call Status: Today's TOC FU Call Status:: Successful TOC FU Call Competed TOC FU Call Complete Date: 07/16/22  Transition Care Management Follow-up Telephone Call Date of Discharge: 07/15/22 Discharge Facility: [facility]) Type of Discharge: Inpatient Admission Primary Inpatient Discharge Diagnosis:: "hematemesis with nausea, AKI,UGIB" How have you been since you were released from the hospital?: Better (Pt states she is "doing good." She had a normal BM yesterday evening and this morning. Appetite is improving. Denies any N&V or diarrhea.) Any questions or concerns?: Yes Patient Questions/Concerns:: pt states she BP readings have been in the 100s-114s. Lastt reading was 114/56. She has had some intemttient lightheadedness at times. She does not think she needs to be on two BP meds at this time. Patient Questions/Concerns Addressed: Other: (Pt plans to take BP machine and readings to appt tomorrow to discuss with provider)  Items Reviewed: Did you receive and understand the discharge instructions provided?: Yes Medications obtained and verified?: Yes (Medications Reviewed) Any new allergies since your discharge?: No Dietary orders reviewed?: Yes Type of Diet Ordered:: low salt/heart healthy Do you have support at home?: Yes People in Home: alone  Home Care and Equipment/Supplies: Were Home Health Services Ordered?: NA Any new equipment or medical supplies ordered?: NA  Functional Questionnaire: Do you need assistance with bathing/showering or dressing?: No Do you need assistance with meal preparation?: No Do you need assistance with eating?: No Do you have difficulty maintaining continence: No Do you need assistance with getting out of bed/getting out of a chair/moving?: No Do you have difficulty managing or taking your medications?: No  Follow up appointments reviewed: PCP Follow-up  appointment confirmed?: No (pt preferred not to schedule PCP follow up appt until after her GI appt on tomorrow) MD Provider Line Number:[phone] Given: No Specialist Hospital Follow-up appointment confirmed?: Yes Date of Specialist follow-up appointment?: 07/17/22 Follow-Up Specialty Provider:: Dr. Chales Abrahams Do you need transportation to your follow-up appointment?: No Do you understand care options if your condition(s) worsen?: Yes-patient verbalized understanding  SDOH Interventions Today    Flowsheet Row Most Recent Value  SDOH Interventions   Food Insecurity Interventions Intervention Not Indicated  Transportation Interventions Intervention Not Indicated      TOC Interventions Today    Flowsheet Row Most Recent Value  TOC Interventions   TOC Interventions Discussed/Reviewed TOC Interventions Discussed      Interventions Today    Flowsheet Row Most Recent Value  Chronic Disease   Chronic disease during today's visit Hypertension (HTN)  General Interventions   General Interventions Discussed/Reviewed General Interventions Discussed, Doctor Visits, Durable Medical Equipment (DME)  Doctor Visits Discussed/Reviewed Doctor Visits Discussed, PCP, Specialist  [pt did not want to make PCP follow up appt until after seen by GI MD on tomorrow-she will call the office on her own at later time]  Durable Medical Equipment (DME) BP Cuff  PCP/Specialist Visits Compliance with follow-up visit  Education Interventions   Education Provided Provided Education  Provided Verbal Education On Nutrition, When to see the doctor, Medication, Other  Nutrition Interventions   Nutrition Discussed/Reviewed Nutrition Discussed, Adding fruits and vegetables, Increaing proteins, Decreasing salt, Fluid intake  Pharmacy Interventions   Pharmacy Dicussed/Reviewed Pharmacy Topics Discussed, Medications and their functions  Safety Interventions   Safety Discussed/Reviewed Safety Discussed        Alessandra Grout [facility] Care Management Care Management Community Coordinator Direct Phone: [phone] Toll Free: [phone] Fax: [phone]

## 2022-07-16 NOTE — ED Provider Notes (Signed)
[place] EMERGENCY DEPARTMENT AT [facility] Provider Note   CSN: [id number] Arrival date & time: 07/16/22  1812     History  Chief Complaint  Patient presents with   Melena    Sara [name] is a 77 y.o. female.  77 year old patient presents after having dark stool.  Patient recently hospitalized for upper GI bleed.  Has some slight abdominal cramping with this but that is since resolved.  Denies any new hematemesis.  No fever or chills.  No frank blood per rectum.  Was concerned about this and presents at this time.       Home Medications Prior to Admission medications   Medication Sig Start Date End Date Taking? Authorizing Provider  acetaminophen (TYLENOL) 325 MG tablet Take 2 tablets (650 mg total) by mouth every 6 (six) hours as needed for mild pain (or Fever >/= 101). 07/15/22   Sheikh, Omair Latif, DO  aspirin EC 81 MG tablet Take 1 tablet (81 mg total) by mouth daily. 01/15/22   Duke Salvia, MD  Cholecalciferol (QC VITAMIN D3) 50 MCG (2000 UT) CAPS Take 1 capsule by mouth daily.    [provider]  clopidogrel (PLAVIX) 75 MG tablet Take 1 tablet (75 mg total) by mouth daily. 04/29/22   Azalee Course, PA  COVID-19 mRNA vaccine [id number] (COMIRNATY) SUSP injection Inject into the muscle. 01/03/22   Judyann Munson, MD  cyanocobalamin (VITAMIN B12) 1000 MCG tablet Take 1,000 mcg by mouth daily.    [provider]  furosemide (LASIX) 20 MG tablet Take 1 tablet by mouth daily for 3 days, then take as needed for fluid Patient taking differently: Take 20 mg by mouth daily as needed for edema. 03/11/22   Duke Salvia, MD  losartan (COZAAR) 25 MG tablet Take 1 tablet (25 mg total) by mouth daily. 06/24/22   Sharlene Dory, PA-C  methocarbamol (ROBAXIN) 500 MG tablet Take 1-2 tablets (500-1,000 mg total) by mouth every 6 to 8 hours as needed for muscle spasms/tension. 01/28/22     Multiple Vitamin (MULTIVITAMIN WITH MINERALS) TABS Take 1 tablet by  mouth daily.    [provider]  nitroGLYCERIN (NITROSTAT) 0.4 MG SL tablet Place 1 tablet (0.4 mg total) under the tongue every 5 (five) minutes as needed for chest pain. 05/13/22 08/11/22  Carlos Levering, NP  ondansetron (ZOFRAN) 4 MG tablet Take 1 tablet (4 mg total) by mouth every 6 (six) hours as needed for nausea. 07/15/22   Marguerita Merles Latif, DO  pantoprazole (PROTONIX) 40 MG tablet Take 1 tablet (40 mg total) by mouth 2 (two) times daily. 07/15/22   Marguerita Merles Latif, DO  polyethylene glycol powder (GLYCOLAX/MIRALAX) 17 GM/SCOOP powder Take 17 g by mouth daily as needed for mild constipation. 07/15/22   Marguerita Merles Latif, DO  Probiotic Product (PROBIOTIC DAILY PO) Take 1 capsule by mouth daily.    [provider]  rosuvastatin (CRESTOR) 5 MG tablet Take 1 tablet (5 mg total) by mouth daily. 02/04/22   Duke Salvia, MD  verapamil (CALAN-SR) 180 MG CR tablet Take 1 tablet (180 mg total) by mouth daily. 09/04/21   Duke Salvia, MD  Zoster Vaccine Adjuvanted [facility]) injection Inject into the muscle. 06/22/21         Allergies    Augmentin [amoxicillin-pot clavulanate], Metoprolol, and Nsaids    Review of Systems   Review of Systems  All other systems reviewed and are negative.   Physical Exam Updated Vital  Signs BP (!) 110/55   Pulse 73   Temp 98 F (36.7 C) (Oral)   Resp 16   LMP  (LMP Unknown)   SpO2 98%  Physical Exam Vitals and nursing note reviewed.  Constitutional:      General: She is not in acute distress.    Appearance: Normal appearance. She is well-developed. She is not toxic-appearing.  HENT:     Head: Normocephalic and atraumatic.  Eyes:     General: Lids are normal.     Conjunctiva/sclera: Conjunctivae normal.     Pupils: Pupils are equal, round, and reactive to light.  Neck:     Thyroid: No thyroid mass.     Trachea: No tracheal deviation.  Cardiovascular:     Rate and Rhythm: Normal rate and regular rhythm.     Heart  sounds: Normal heart sounds. No murmur heard.    No gallop.  Pulmonary:     Effort: Pulmonary effort is normal. No respiratory distress.     Breath sounds: Normal breath sounds. No stridor. No decreased breath sounds, wheezing, rhonchi or rales.  Abdominal:     General: There is no distension.     Palpations: Abdomen is soft.     Tenderness: There is no abdominal tenderness. There is no rebound.  Musculoskeletal:        General: No tenderness. Normal range of motion.     Cervical back: Normal range of motion and neck supple.  Skin:    General: Skin is warm and dry.     Findings: No abrasion or rash.  Neurological:     Mental Status: She is alert and oriented to person, place, and time. Mental status is at baseline.     GCS: GCS eye subscore is 4. GCS verbal subscore is 5. GCS motor subscore is 6.     Cranial Nerves: No cranial nerve deficit.     Sensory: No sensory deficit.     Motor: Motor function is intact.  Psychiatric:        Attention and Perception: Attention normal.        Speech: Speech normal.        Behavior: Behavior normal.     ED Results / Procedures / Treatments   Labs (all labs ordered are listed, but only abnormal results are displayed) Labs Reviewed  COMPREHENSIVE METABOLIC PANEL - Abnormal; Notable for the following components:      Result Value   Potassium 3.3 (*)    Glucose, Bld 101 (*)    BUN 26 (*)    Creatinine, Ser 1.53 (*)    Calcium 8.4 (*)    Total Protein 6.4 (*)    GFR, Estimated 35 (*)    All other components within normal limits  CBC WITH DIFFERENTIAL/PLATELET - Abnormal; Notable for the following components:   RBC 3.04 (*)    Hemoglobin 9.4 (*)    HCT 28.3 (*)    All other components within normal limits  POC OCCULT BLOOD, ED  TYPE AND SCREEN    EKG None  Radiology No results found.  Procedures Procedures    Medications Ordered in ED Medications - No data to display  ED Course/ Medical Decision Making/ A&P                              Medical Decision Making Amount and/or Complexity of Data Reviewed Labs: ordered.   Patient's hemoglobin is improved from prior.  Her electrolytes are also improved as well to.  Suspect that the dark stool was from her resolving upper GI bleed.  She feels back to baseline at this time.  Patient be discharged and strict return precautions given.        Final Clinical Impression(s) / ED Diagnoses Final diagnoses:  None    Rx / DC Orders ED Discharge Orders     None         Lorre Nick, MD 07/16/22 2207

## 2022-07-16 NOTE — ED Provider Triage Note (Signed)
Emergency Medicine Provider Triage Evaluation Note  Sara [name] , a 77 y.o. female  was evaluated in triage.  Pt complains of dark, tarry stools, onset today. Patient was seen on 4/19 for upper GI bleed. Was anemic and required blood transfusion. She has been having some intermittent LUQ/epigastric discomfort since starting low dose aspirin. Endorses mild shortness of breath today. Has an appointment with gastroenterology tomorrow.  Review of Systems  Positive: Melena,shortness of breath Negative: Chest pain, low abdominal pain  Physical Exam  BP (!) 145/78 (BP Location: Left Arm)   Pulse 88   Temp 98 F (36.7 C) (Oral)   Resp 18   LMP  (LMP Unknown)   SpO2 100%  Gen:   Awake, no distress   Resp:  Normal effort  MSK:   Moves extremities without difficulty  Other:  Abdomen soft, no current localized tenderness  Medical Decision Making  Medically screening exam initiated at 6:57 PM.  Appropriate orders placed.  Sara [name] was informed that the remainder of the evaluation will be completed by another provider, this initial triage assessment does not replace that evaluation, and the importance of remaining in the ED until their evaluation is complete.     Sara Morn, NP 07/16/22 1901

## 2022-07-16 NOTE — Discharge Instructions (Signed)
Drink plenty of fluids when you get home.  Return if you blood, start passing blood in your rectum, feel profoundly weak, or any other problems

## 2022-07-16 NOTE — ED Triage Notes (Signed)
Pt was discharged yesterday from [facility] ([place]  She had been admitted for GI bleed, she reports that she had vomited blood and had blood transfusion.  Pt had a normal BM while hospitalized and this am and then this afternoon she had a black, tarry BM.  Pt reports that she felt a little weak and woozy after this occurred.

## 2022-07-16 NOTE — Discharge Summary (Signed)
Physician Discharge Summary   Patient: Sara [name] MRN: [id number] DOB: 10/29/1945  Admit date:     07/12/2022  Discharge date: 07/15/2022  Discharge Physician: Marguerita Merles, DO   PCP: [name], Buren Kos, MD   Recommendations at discharge:   Follow-up with PCP within 1 to 2 weeks for repeat CBC, CMP, mag, Phos within 1 week Follow-up with gastroenterology in outpatient setting and continue twice daily PPI; has an appointment with gastroenterology Dr. Chales Abrahams on 07/17/2022 Follow-up with cardiology Dr. Anne Fu in outpatient setting within 1 to 2 weeks  Discharge Diagnoses: Principal Problem:   UGIB (upper gastrointestinal bleed) Active Problems:   Diaphragmatic hernia   CKD (chronic kidney disease) stage 3, GFR 30-59 ml/min (HCC)   SVT (supraventricular tachycardia)   CAD (coronary artery disease)   Known medical problems   AKI (acute kidney injury)  Resolved Problems:   * No resolved hospital problems. [facility] Course: HPI per Dr. Merian Capron on 07/12/22 HPI: Sara [name] is a 77 y.o. female with medical history significant for CAD status post DES placement to RCA in 04/2022, hiatal hernia status post surgical repair, CKD (baseline creatinine 1.1), SVT, OA, who presents after having bloody emesis at home.   Patient reports that she has been dealing with a pain in her epigastric to left-sided chest region for a bit now.  She has been seeing her PCP, cardiologist, and gastroenterologist to work this issue up.  Earlier today she began to feel unwell, she felt nauseous and had emesis of what looked to her like red Jell-O.  This was alarming to her and so she presented to the ED.  Patient reports that she is compliant with all of her medications.  She recently received a drug-eluting stent 2 months ago and has been taking Plavix regularly.  She has CKD and so she does not take NSAIDs, does not drink significant amounts of alcohol.   In the ED initial vital signs notable for  mild tachycardia, low normal blood pressures.  Initial CBC showed drop in hemoglobin to 11.2 down from 14.2 a month ago, and it subsequently dropped 4 hours later to 9.6 on recheck.  BMP notable for creatinine of 1.7 (baseline 1.1).  Serial troponins were normal.  CTPA was obtained to rule out pulmonary embolism, significant motion artifact but no large pulmonary embolism seen.  However a large hiatal hernia along with a dilated and fluid-filled esophagus was seen. EKG showed sinus tachycardia, no other acute findings.  Type and screen was obtained, she was started on an IV PPI gtt., and gastroenterology was consulted.  During her stay in the ED she had emesis of an additional 450 mL of what appeared to be hematemesis.   She was admitted for further management and workup.   **Interim History Underwent EGD and found to have an esophageal ulcer with no stigmata for bleeding.  GI recommends changing to PPI twice daily and resuming Plavix 07/13/2022 and monitor any further signs and symptoms of bleeding.  Unfortunately her blood count dropped further had no active signs and symptoms bleeding and GI thinks it is equilbration so she is typed and screened and transfused 1 unit of PRBCs, Her blood counts improved posttransfusion and remained stable.  GI felt that she could safely go home given that she had a normal bowel movement and had no further evidence of bleeding.  Recommend that she go on aspirin and Plavix given her cardiac issues and recommending twice daily PPI.  She  will need to follow-up with her PCP, gastroenterologist as well as cardiologist in outpatient setting within 1 to 2 weeks   Assessment and Plan: *UGIB (upper gastrointestinal bleed) in the setting of esophageal ulcer ABLA -Likely due to combination of her Plavix therapy for recent cardiac stent as well as her known large hiatal hernia.  Gastroenterology have evaluated patient and are planning on endoscopy in the morning. -Clear liquids for  now, n.p.o. at midnight -IVF now stopped  -Hgb/Hct Trend: Recent Labs  Lab 07/12/22 1723 07/13/22 1010 07/14/22 0354 07/14/22 1500 07/15/22 0415 07/15/22 1202 07/16/22 1850  HGB 9.6* 7.9* 6.9* 8.4* 8.4* 8.4* 9.4*  HCT 30.3* 24.3* 21.7* 25.4* 25.6* 25.8* 28.3*  MCV  --  96.4 96.9  --  94.8  --  93.1  -Trended Hgb/Hct q6h -Anemia panel was showing iron of 161, UIBC of 164, TIBC of 225, saturation ratios of 27%, ferritin level 51, folate level 21.1, vitamin B12 454 -Continued IV PPI gtt and change to po BID PPI -Held aspirin and Plavix pending EGD and see below. Now her Plavix has been resumed -With cardiology Dr. Izora Ribas who recommends resuming aspirin at the minimum once okay with GI and recommending deferring resuming Plavix to GI and safe - Given significant ongoing drop in hemoglobin, will type and cross for 2 units, per GI target hemoglobin greater than 8 -EGD done and showed an esophageal ulcer with no stigmata of recent bleeding as well as a 5 cm hiatal hernia as well as a gastro esophageal flap valve classified as Hill grade 4 and esophageal mucosal changes classified as Barrett's stage C 13-M15 per Prague criteria as well as a normal stomach and normal examined duodenum and no specimens collected -GI recommends resuming Plavix 07/12/20 and pantoprazole PPI 4 mg twice daily -Blood Count had dropped but GI feels that there is no evidence of very gross bleeding and felt that the low blood count was felt to be equilibration -Her hemoglobin remained stable and GI felt that she could go home given that she passed a normal brown stool with no other black stool rectal bleeding noted.  GI recommended continuing aspirin and Plavix with twice daily PPI for discharge given that she improved and she is stable for discharge -Patient has a close follow-up with gastroenterology Dr. Chales Abrahams on 07/17/2022   AKI (acute kidney injury) on CKD Stage 3a -Likely secondary to combination of acute blood  loss anemia and poor p.o. intake in setting of acute illness.   -Baseline Cr is 1.1 -Status post 1 L bolus in ED, will be on maintenance fluids overnight, trend creatinine. -BUN/Cr Trend: Recent Labs  Lab 07/12/22 1348 07/13/22 0438 07/14/22 0354 07/15/22 0415  BUN 44* 66* 45* 40*  CREATININE 1.75* 1.58* 1.79* 1.57*  -IVF now stopped and getting blood -Avoid Nephrotoxic Medications, Contrast Dyes, Hypotension and Dehydration to Ensure Adequate Renal Perfusion and will need to Renally Adjust Meds -Continue to Monitor and Trend Renal Function carefully and repeat CMP in the AM     Hyperkalemia -K+ Trend Recent Labs  Lab 07/12/22 1348 07/13/22 0438 07/14/22 0354 07/15/22 0415  K 5.2* 5.0 4.5 4.3  -Continue to Monitor and Trend and repeat CMP in the AM   Leukocytosis -WBC Trend: Recent Labs  Lab 07/12/22 1348 07/13/22 1010 07/14/22 0354 07/15/22 0415 07/16/22 1850  WBC 11.4* 7.8 7.1 8.6 9.8   -Continue to Monitor and Trend and repeat CBC in the AM   Hypertension -Held Verapamil in setting of low normal  blood pressures and held Losartan in setting of AKI and resume at discharge -Continue to Monitor BP per Protocol    CAD s/p DES placement to the RCA in 05/18/22 -Held aspirin and Plavix now and resume as soon as possible per GI recommendations given her recent stenting and after EGD GI recommends resuming Plavix 75 mg po Daily and after discussion with Dr. Concha Se today he feels that is okay to resume aspirin as well -Resume Statin; Hold Losartan and Verapamil while hospitalized and then resume at discharge   HLD -Resume Rosuvastatin 5 mg po Daily    Hypoalbuminemia -Patient's Albumin Trend: Recent Labs  Lab 07/13/22 1010 07/14/22 0354 07/15/22 0415  ALBUMIN 2.6* 2.5* 2.8*  -Continue to Monitor and Trend and repeat CMP in the AM  Consultants: Gastroenterology; Discussed with Dr. Heber [place] of Cardiology  Procedures performed:   EGD Findings:      One  superficial esophageal ulcer with no stigmata of recent bleeding was       found 30 cm from the incisors. The lesion was 10 mm in largest dimension.      A 5 cm hiatal hernia was present.      The gastroesophageal flap valve was visualized endoscopically and       classified as Hill Grade IV (no fold, wide open lumen, hiatal hernia       present).      There were esophageal mucosal changes classified as Barrett's stage       C13-M15 per Prague criteria present in the upper third of the esophagus,       in the middle third of the esophagus and in the lower third of the       esophagus. The maximum longitudinal extent of these mucosal changes was       15 cm in length.      The stomach was normal.      The examined duodenum was normal.      Provation failed to capture all the images. From the top of the gastric       folds (37 cm) to the Z-line (24 cm) a Barrett's esophagus was noted. The       maximal extent of the Barrett's was noted at 22 cm. There was no       evidence of any masses or nodularity in the Barrett's esophagus. The       ulcer in the mid esophagus was clean-based. Impression:               - Esophageal ulcer with no stigmata of recent                            bleeding.                           - 5 cm hiatal hernia.                           - Gastroesophageal flap valve classified as Hill                            Grade IV (no fold, wide open lumen, hiatal hernia                            present).                           -  Esophageal mucosal changes classified as                            Barrett's stage C13-M15 per Prague criteria.                           - Normal stomach.                           - Normal examined duodenum.                           - No specimens collected.  Disposition: Home Diet recommendation:  Discharge Diet Orders (From admission, onward)     Start     Ordered   07/15/22 0000  Diet - low sodium heart healthy        07/15/22 1319            Cardiac diet DISCHARGE MEDICATION: Allergies as of 07/15/2022       Reactions   Augmentin [amoxicillin-pot Clavulanate] Diarrhea   Metoprolol    Had prior to cor CT, felt very dizzy   Nsaids    Avoid due to diminished kidney function        Medication List     TAKE these medications    acetaminophen 325 MG tablet Commonly known as: TYLENOL Take 2 tablets (650 mg total) by mouth every 6 (six) hours as needed for mild pain (or Fever >/= 101).   aspirin EC 81 MG tablet Take 1 tablet (81 mg total) by mouth daily.   clopidogrel 75 MG tablet Commonly known as: Plavix Take 1 tablet (75 mg total) by mouth daily.   COVID-19 mRNA vaccine 2023-2024 Susp injection Commonly known as: COMIRNATY Inject into the muscle.   cyanocobalamin 1000 MCG tablet Commonly known as: VITAMIN B12 Take 1,000 mcg by mouth daily.   furosemide 20 MG tablet Commonly known as: LASIX Take 1 tablet by mouth daily for 3 days, then take as needed for fluid What changed:  how much to take how to take this when to take this reasons to take this additional instructions   losartan 25 MG tablet Commonly known as: COZAAR Take 1 tablet (25 mg total) by mouth daily.   methocarbamol 500 MG tablet Commonly known as: ROBAXIN Take 1-2 tablets (500-1,000 mg total) by mouth every 6 to 8 hours as needed for muscle spasms/tension.   multivitamin with minerals Tabs tablet Take 1 tablet by mouth daily.   nitroGLYCERIN 0.4 MG SL tablet Commonly known as: NITROSTAT Place 1 tablet (0.4 mg total) under the tongue every 5 (five) minutes as needed for chest pain.   ondansetron 4 MG tablet Commonly known as: ZOFRAN Take 1 tablet (4 mg total) by mouth every 6 (six) hours as needed for nausea.   pantoprazole 40 MG tablet Commonly known as: PROTONIX Take 1 tablet (40 mg total) by mouth 2 (two) times daily.   polyethylene glycol powder 17 GM/SCOOP powder Commonly known as: GLYCOLAX/MIRALAX Take  17 g by mouth daily as needed for mild constipation.   PROBIOTIC DAILY PO Take 1 capsule by mouth daily.   QC Vitamin D3 50 MCG (2000 UT) Caps Generic drug: Cholecalciferol Take 1 capsule by mouth daily.   rosuvastatin 5 MG tablet Commonly known as: CRESTOR Take 1 tablet (5 mg total) by mouth daily.  Shingrix injection Generic drug: Zoster Vaccine Adjuvanted Inject into the muscle.   verapamil 180 MG CR tablet Commonly known as: CALAN-SR Take 1 tablet (180 mg total) by mouth daily.       Discharge Exam: Filed Weights   07/12/22 1348  Weight: 74.4 kg   Vitals:   07/15/22 0454 07/15/22 1251  BP: (!) 111/59 (!) 116/51  Pulse: (!) 58 73  Resp: 17 20  Temp: 98.2 F (36.8 C) 98.1 F (36.7 C)  SpO2: 99% 99%   Examination: Physical Exam:  Constitutional: WN/WD overweight Caucasian female in NAD Respiratory: Diminished to auscultation bilaterally, no wheezing, rales, rhonchi or crackles. Normal respiratory effort and patient is not tachypenic. No accessory muscle use. Unlabored breathing Cardiovascular: RRR, no murmurs / rubs / gallops. S1 and S2 auscultated. No extremity edema.  Abdomen: Soft, non-tender, non-distended. Bowel sounds positive.  GU: Deferred. Musculoskeletal: No clubbing / cyanosis of digits/nails. No joint deformity upper and lower extremities. .  Skin: No rashes, lesions, ulcers on a limited skin evaluation. No induration; Warm and dry.  Neurologic: CN 2-12 grossly intact with no focal deficits.  Romberg sign and cerebellar reflexes not assessed.  Psychiatric: Normal judgment and insight. Alert and oriented x 3. Normal mood and appropriate affect.   Condition at discharge: stable  The results of significant diagnostics from this hospitalization (including imaging, microbiology, ancillary and laboratory) are listed below for reference.   Imaging Studies: DG Abd Portable 1 View  Result Date: 07/12/2022 CLINICAL DATA:  NG placement. EXAM: PORTABLE  ABDOMEN - 1 VIEW COMPARISON:  Chest CT dated 07/12/2022. FINDINGS: Enteric tube with side port and tip below the diaphragm over the epigastric area likely in the body of the stomach. A hiatal hernia again noted and better seen on the CT. IMPRESSION: Enteric tube with tip likely in the body of the stomach. Electronically Signed   By: Elgie Collard M.D.   On: 07/12/2022 20:24   CT Angio Chest PE W and/or Wo Contrast  Result Date: 07/12/2022 CLINICAL DATA:  Recent coronary stent. Shortness of breath. Coughing up blood. EXAM: CT ANGIOGRAPHY CHEST WITH CONTRAST TECHNIQUE: Multidetector CT imaging of the chest was performed using the standard protocol during bolus administration of intravenous contrast. Multiplanar CT image reconstructions and MIPs were obtained to evaluate the vascular anatomy. RADIATION DOSE REDUCTION: This exam was performed according to the departmental dose-optimization program which includes automated exposure control, adjustment of the mA and/or kV according to patient size and/or use of iterative reconstruction technique. CONTRAST:  50mL OMNIPAQUE IOHEXOL 350 MG/ML SOLN COMPARISON:  None Available. FINDINGS: Cardiovascular: There is significant breathing motion. This limits evaluation for pulmonary emboli. Nondiagnostic for small and peripheral emboli. No large or central embolus identified. Coronary artery calcifications are noted. There are coronary stents identified. Trace pericardial fluid. The heart is nonenlarged. The thoracic aorta has a normal course and caliber with mild atherosclerotic calcified plaque. There is a bovine type aortic arch, a normal variant. Mediastinum/Nodes: Large hiatal hernia. There is patulous fluid-filled esophagus in the thorax. Mild presumed mucosal enhancement within the distal esophagus posteriorly on series 4, image 52. No specific abnormal lymph node enlargement identified in the axillary region, hilum or mediastinum. Lungs/Pleura: Diffuse breathing  motion. No consolidation, pneumothorax or effusion. There is some dependent atelectasis in the lung bases. Upper Abdomen: The adrenal glands are preserved in the upper abdomen. There is made of moderate debris in the stomach. Diffuse colonic stool. Left-sided colonic diverticula seen at the edge of the imaging  field. Normal retrocecal appendix. Musculoskeletal: Mild degenerative changes seen along the spine. Review of the MIP images confirms the above findings. IMPRESSION: Large hiatal hernia. There is a dilated fluid-filled esophagus. Submucosal hyperenhancement as well along the lower esophagus. Further workup when clinically appropriate. Significant breathing motion significantly limiting evaluation for pulmonary emboli. No large or central embolus. Trace pericardial fluid. Aortic Atherosclerosis (ICD10-I70.0). Electronically Signed   By: Karen Kays M.D.   On: 07/12/2022 16:49    Microbiology: Results for orders placed or performed in visit on 06/17/22  Urine Culture     Status: None   Collection Time: 06/17/22  3:55 AM   Specimen: Urine   UR  Result Value Ref Range Status   Urine Culture, Routine Final report  Final   Organism ID, Bacteria Comment  Final    Comment: Mixed urogenital flora 50,000-100,000 colony forming units per mL    Labs: CBC: Recent Labs  Lab 07/12/22 1348 07/12/22 1723 07/13/22 1010 07/14/22 0354 07/14/22 1500 07/15/22 0415 07/15/22 1202 07/16/22 1850  WBC 11.4*  --  7.8 7.1  --  8.6  --  9.8  NEUTROABS  --   --  4.8 5.9  --  4.7  --  6.2  HGB 11.2*   < > 7.9* 6.9* 8.4* 8.4* 8.4* 9.4*  HCT 34.7*   < > 24.3* 21.7* 25.4* 25.6* 25.8* 28.3*  MCV 95.9  --  96.4 96.9  --  94.8  --  93.1  PLT 270  --  186 151  --  160  --  236   < > = values in this interval not displayed.   Basic Metabolic Panel: Recent Labs  Lab 07/12/22 1348 07/13/22 0438 07/13/22 1010 07/14/22 0354 07/15/22 0415  NA 138 140  --  138 140  K 5.2* 5.0  --  4.5 4.3  CL 106 109  --  110  108  CO2 23 24  --  24 26  GLUCOSE 141* 95  --  131* 93  BUN 44* 66*  --  45* 40*  CREATININE 1.75* 1.58*  --  1.79* 1.57*  CALCIUM 8.6* 8.3*  --  8.2* 8.5*  MG  --   --  2.2 2.0 1.8  PHOS  --   --  3.2 3.8 3.6   Liver Function Tests: Recent Labs  Lab 07/13/22 1010 07/14/22 0354 07/15/22 0415  AST 14* 15 19  ALT 11 11 11   ALKPHOS 39 37* 52  BILITOT 0.7 0.4 0.4  PROT 4.8* 4.6* 4.8*  ALBUMIN 2.6* 2.5* 2.8*   CBG: Recent Labs  Lab 07/12/22 1409  GLUCAP 135*   Discharge time spent: greater than 30 minutes.  Signed: Marguerita Merles, DO [facility] 07/16/2022

## 2022-07-17 ENCOUNTER — Other Ambulatory Visit (HOSPITAL_BASED_OUTPATIENT_CLINIC_OR_DEPARTMENT_OTHER): Payer: Self-pay

## 2022-07-17 ENCOUNTER — Ambulatory Visit: Payer: Medicare Other | Admitting: Gastroenterology

## 2022-07-17 ENCOUNTER — Other Ambulatory Visit: Payer: Self-pay

## 2022-07-17 ENCOUNTER — Encounter: Payer: Self-pay | Admitting: Gastroenterology

## 2022-07-17 VITALS — BP 90/44 | HR 96 | Ht 65.5 in | Wt 165.0 lb

## 2022-07-17 DIAGNOSIS — K922 Gastrointestinal hemorrhage, unspecified: Secondary | ICD-10-CM

## 2022-07-17 DIAGNOSIS — K449 Diaphragmatic hernia without obstruction or gangrene: Secondary | ICD-10-CM | POA: Diagnosis not present

## 2022-07-17 MED ORDER — PANTOPRAZOLE SODIUM 40 MG PO TBEC
40.0000 mg | DELAYED_RELEASE_TABLET | Freq: Two times a day (BID) | ORAL | 3 refills | Status: DC
  Filled 2022-07-17 – 2022-08-07 (×3): qty 180, 90d supply, fill #0
  Filled 2022-11-11: qty 180, 90d supply, fill #1
  Filled 2023-02-02: qty 180, 90d supply, fill #2
  Filled 2023-04-29: qty 180, 90d supply, fill #3

## 2022-07-17 MED ORDER — SUCRALFATE 1 GM/10ML PO SUSP
1.0000 g | Freq: Two times a day (BID) | ORAL | 0 refills | Status: DC
  Filled 2022-07-17: qty 280, 14d supply, fill #0

## 2022-07-17 NOTE — Patient Instructions (Addendum)
_______________________________________________________  If your blood pressure at your visit was 140/90 or greater, please contact your primary care physician to follow up on this.  _______________________________________________________  If you are age 75 or older, your body mass index should be between 23-30. Your Body mass index is 27.04 kg/m. If this is out of the aforementioned range listed, please consider follow up with your Primary Care Provider.  If you are age 74 or younger, your body mass index should be between 19-25. Your Body mass index is 27.04 kg/m. If this is out of the aformentioned range listed, please consider follow up with your Primary Care Provider.   ________________________________________________________  The [facility] providers would like to encourage you to use [facility] to communicate with providers for non-urgent requests or questions.  Due to long hold times on the telephone, sending your provider a message by [facility] may be a faster and more efficient way to get a response.  Please allow 48 business hours for a response.  Please remember that this is for non-urgent requests.  _______________________________________________________  We have sent the following medications to your pharmacy for you to pick up at your convenience: Carafate Protonix  Please come to [facility] tomorrow to the basement level to have lab work done and then come back on Monday July 22, 2022 and Thursday Jul 25, 2022 for lab work as well  Please purchase the following medications over the counter and take as directed: Iron tablet daily Miralax 17g daily  You have been scheduled for an appointment with Dr. Chales Abrahams on Jul 30 2022 at 9:10am . Please arrive 10 minutes early for your appointment.  Please call with any questions or concerns.  Thank you,  Dr. Lynann Bologna

## 2022-07-17 NOTE — Progress Notes (Signed)
Chief Complaint:   Referring Provider:  [name], Raymond J, MD      ASSESSMENT AND PLAN;   #1. UGI bleed d/t 10mm mid esophageal ulcer on EGD 07/13/2022  #2. Large 5 cm HH (seen Dr Daphine Deutscher) with Barrett's esophagus.  No masses or nodularity and Barrett's esophagus.  Failed fundoplication 2011  #3. CAD s/p recent DES 04/29/2022 (on bASA/plavix)  Plan: -protonix 40 BID -Add Carafate elixir 1g po BID x 2 weeks (low dose d/t CKD) -CBC, CMP tmr, then next week CBC Monday and thus. -Start Iron 1 tab po qd (may turn stool dark) -Miralax 17g po QD (for constipation) -FU in 2 weeks. -She would eventually require repeat EGD/colonoscopy, preferably when she is off Plavix.   HPI:    Sara [name] is a 77 y.o. female  Patient of Dr. Christella Hartigan With hypertension, hyperlipidemia, CKD stage III, CAD status post DES stent to mid RCA on 04/29/22 on (bASA/clopidogrel), hiatal hernia status post failed fundoplication 2011, gastritis, esophagitis, Barrett's esophagus  FU from recent adm  Adm with hematemesis. Hb 14 (05/2022) to 6.9 s/p 1U to 8.4.  EGD 07/13/2022 identified a 5 cm hiatal hernia, and esophageal ulcer without stigmata of recent bleeding and evidence of Barrett's esophagus. Stomach and duodenum were normal. On PPI bid. Plavix restarted post EGD.  Seen again in ED yesterday with melena Hb 9.4.  Does feel somewhat better.  Stool has cleared.  No nausea, vomiting, hematemesis, dysphagia or odynophagia.  No fever or chills.  Has history of chronic constipation for which she uses MiraLAX.  We have gone over recent EGD, CT Abdo/pelvis, labs etc.  She is under close cardiology follow-up as well.  It has been recommended to continue baby aspirin/Plavix for now.  She has seen Dr. Daphine Deutscher for surgical repair of hiatal hernia last year-recommended conservative management.    Past GI workup: EGD by Dr. Elnoria Howard 07/13/2022: - Esophageal ulcer with no stigmata of recent bleeding.  - 5 cm  hiatal hernia.  - Gastroesophageal flap valve classified as Hill Grade IV (no fold, wide open lumen, hiatal hernia present).  - Esophageal mucosal changes classified as Barrett's stage C13-M15 per Prague criteria.  - Normal stomach.  - Normal examined duodenum.  - No specimens collected.  CTA 07/12/2022 chest Large hiatal hernia. There is a dilated fluid-filled esophagus. Submucosal hyperenhancement as well along the lower esophagus. Further workup when clinically appropriate. Significant breathing motion significantly limiting evaluation for pulmonary emboli. No large or central embolus.  UGI series 07/2021 1. Small to moderate recurrent hiatal hernia. 2. Severe spontaneous gastroesophageal reflux, noting undigested food debris throughout the refluxed material in the esophagus. 3. Moderate esophageal dysmotility, compatible with chronic reflux related dysmotility. 4. Small periampullary duodenal diverticulum. Otherwise normal upper GI. No masses or strictures.  01/2018 colonoscopy Dr. Christella Hartigan 15 mm polyp cecum 3 mm polyp descending colon diverticula  12/10/2019 endoscopy for indigestion, remote fundoplication for large HH, Barrett's sowed significantly anatomical abnormal proximal stomach metaplasia is intact however the diaphragm is a medium size hiatal hernia free reflux of gastric secretion in the esophagus no retained solid or liquid food in the stomach nonspecific mild distal gastritis esophagus torturous GE junction single erosion, Barrett's appearing mucosa up to 25 cm mild reflux related esophagitis Pathology showed Barrett's changes without dysplasia recall 3 years.   SH-going to her niece's wedding next sat  Past Medical History:  Diagnosis Date   Anxiety 07/2020   Aortic dilatation 03/25/2022   38 mm  noted on echo   Arthritis 11/2017   Barrett's esophagus    CAD (coronary artery disease)    Cataract    had surgery   Chronic kidney disease, stage 3a    GERD  (gastroesophageal reflux disease)    hx of    HIATAL HERNIA    s/p surgical repair   Hyperlipidemia    HYPERTENSION    Kidney stone    1990s   Known medical problems 07/12/2022   LVH (left ventricular hypertrophy)    Osteoporosis    PAT (paroxysmal atrial tachycardia)    Premature atrial contractions     Past Surgical History:  Procedure Laterality Date   ABDOMINAL HYSTERECTOMY  06/12/2012   CATARACT EXTRACTION  2009   both eyes   CHOLECYSTECTOMY  11/02/2012   Procedure: CHOLECYSTECTOMY;  Surgeon: Valarie Merino, MD;  Location: [facility];  Service: General;;   COLOSTOMY     CORONARY STENT INTERVENTION N/A 04/29/2022   Procedure: CORONARY STENT INTERVENTION;  Surgeon: Yvonne Kendall, MD;  Location: [facility];  Service: Cardiovascular;  Laterality: N/A;   CORONARY ULTRASOUND/IVUS N/A 04/29/2022   Procedure: Intravascular Ultrasound/IVUS;  Surgeon: Yvonne Kendall, MD;  Location: [facility];  Service: Cardiovascular;  Laterality: N/A;   CYSTOCELE REPAIR N/A 06/12/2012   Procedure: ANTERIOR REPAIR (CYSTOCELE);  Surgeon: Robley Fries, MD;  Location: [facility];  Service: Gynecology;  Laterality: N/A;   ESOPHAGOGASTRODUODENOSCOPY (EGD) WITH PROPOFOL N/A 07/13/2022   Procedure: ESOPHAGOGASTRODUODENOSCOPY (EGD) WITH PROPOFOL;  Surgeon: Jeani Hawking, MD;  Location: [facility];  Service: Gastroenterology;  Laterality: N/A;   EYE SURGERY  Cataract removal 2009   HERNIA REPAIR  2011   HIATAL HERNIA REPAIR  2011   LITHOTRIPSY     RIGHT/LEFT HEART CATH AND CORONARY ANGIOGRAPHY N/A 04/29/2022   Procedure: RIGHT/LEFT HEART CATH AND CORONARY ANGIOGRAPHY;  Surgeon: Yvonne Kendall, MD;  Location: [facility];  Service: Cardiovascular;  Laterality: N/A;   TONSILLECTOMY  1968   UPPER GASTROINTESTINAL ENDOSCOPY     UPPER GI ENDOSCOPY  11/02/2012   Procedure: UPPER GI ENDOSCOPY;  Surgeon: Valarie Merino, MD;  Location: [facility];  Service: General;;   VAGINAL HYSTERECTOMY N/A  06/12/2012   Procedure: HYSTERECTOMY VAGINAL;  Surgeon: Robley Fries, MD;  Location: [facility];  Service: Gynecology;  Laterality: N/A;    Family History  Problem Relation Age of Onset   Breast cancer Mother 82       with mets    Cancer Mother    Hypertension Mother    Early death Father    Heart disease Father    Kidney disease Father    Hypertension Other        Parent   Breast cancer Other    Heart disease Other        parent, grandparent   Colon cancer Paternal Aunt    Breast cancer Maternal Grandmother    Asthma Maternal Grandmother    Cancer Maternal Grandmother    Cancer Paternal Uncle    Early death Paternal Uncle    Cancer Paternal Aunt    Esophageal cancer Neg Hx    Rectal cancer Neg Hx    Stomach cancer Neg Hx    Colon polyps Neg Hx     Social History   Tobacco Use   Smoking status: Never    Passive exposure: Past   Smokeless tobacco: Never  Vaping Use   Vaping Use: Never used  Substance Use Topics  Alcohol use: Never   Drug use: Never    Current Outpatient Medications  Medication Sig Dispense Refill   acetaminophen (TYLENOL) 325 MG tablet Take 2 tablets (650 mg total) by mouth every 6 (six) hours as needed for mild pain (or Fever >/= 101). 100 tablet 0   aspirin EC 81 MG tablet Take 1 tablet (81 mg total) by mouth daily. 30 tablet 12   Cholecalciferol (QC VITAMIN D3) 50 MCG (2000 UT) CAPS Take 1 capsule by mouth daily.     clopidogrel (PLAVIX) 75 MG tablet Take 1 tablet (75 mg total) by mouth daily. 30 tablet 11   cyanocobalamin (VITAMIN B12) 1000 MCG tablet Take 1,000 mcg by mouth daily.     furosemide (LASIX) 20 MG tablet Take 1 tablet by mouth daily for 3 days, then take as needed for fluid (Patient taking differently: Take 20 mg by mouth daily as needed for edema.) 30 tablet 3   losartan (COZAAR) 25 MG tablet Take 1 tablet (25 mg total) by mouth daily. 90 tablet 3   methocarbamol (ROBAXIN) 500 MG tablet Take 1-2 tablets (500-1,000 mg total) by  mouth every 6 to 8 hours as needed for muscle spasms/tension. 60 tablet 2   Multiple Vitamin (MULTIVITAMIN WITH MINERALS) TABS Take 1 tablet by mouth daily.     ondansetron (ZOFRAN) 4 MG tablet Take 1 tablet (4 mg total) by mouth every 6 (six) hours as needed for nausea. 20 tablet 0   pantoprazole (PROTONIX) 40 MG tablet Take 1 tablet (40 mg total) by mouth 2 (two) times daily. 60 tablet 0   polyethylene glycol powder (GLYCOLAX/MIRALAX) 17 GM/SCOOP powder Take 17 g by mouth daily as needed for mild constipation. 238 g 0   Probiotic Product (PROBIOTIC DAILY PO) Take 1 capsule by mouth daily.     rosuvastatin (CRESTOR) 5 MG tablet Take 1 tablet (5 mg total) by mouth daily. 90 tablet 3   verapamil (CALAN-SR) 180 MG CR tablet Take 1 tablet (180 mg total) by mouth daily. 90 tablet 3   nitroGLYCERIN (NITROSTAT) 0.4 MG SL tablet Place 1 tablet (0.4 mg total) under the tongue every 5 (five) minutes as needed for chest pain. (Patient not taking: Reported on 07/17/2022) 25 tablet 3   No current facility-administered medications for this visit.    Allergies  Allergen Reactions   Augmentin [Amoxicillin-Pot Clavulanate] Diarrhea   Metoprolol     Had prior to cor CT, felt very dizzy   Nsaids     Avoid due to diminished kidney function    Review of Systems:  Constitutional: Denies fever, chills, diaphoresis, appetite change and fatigue.  HEENT: Denies photophobia, eye pain, redness, hearing loss, ear pain, congestion, sore throat, rhinorrhea, sneezing, mouth sores, neck pain, neck stiffness and tinnitus.   Respiratory: Denies SOB, DOE, cough, chest tightness,  and wheezing.   Cardiovascular: Denies chest pain, palpitations and leg swelling.  Genitourinary: Denies dysuria, urgency, frequency, hematuria, flank pain and difficulty urinating.  Musculoskeletal: Denies myalgias, back pain, joint swelling, arthralgias and gait problem.  Skin: No rash.  Neurological: Denies dizziness, seizures, syncope,  weakness, light-headedness, numbness and headaches.  Hematological: Denies adenopathy. Easy bruising, personal or family bleeding history  Psychiatric/Behavioral: No anxiety or depression     Physical Exam:    BP (!) 90/44 (BP Location: Left Arm, Patient Position: Sitting, Cuff Size: Large)   Pulse 96 Comment: irregular  Ht 5' 5.5" (1.664 m)   Wt 165 lb (74.8 kg)   LMP  (LMP  Unknown)   BMI 27.04 kg/m  Wt Readings from Last 3 Encounters:  07/17/22 165 lb (74.8 kg)  07/12/22 164 lb (74.4 kg)  06/28/22 164 lb (74.4 kg)   Constitutional:  Well-developed, in no acute distress. Psychiatric: Normal mood and affect. Behavior is normal. HEENT: Pupils normal.  Conjunctivae are normal. No scleral icterus. Cardiovascular: Normal rate, regular rhythm. No edema Pulmonary/chest: Effort normal and breath sounds normal. No wheezing, rales or rhonchi. Abdominal: Soft, nondistended. Nontender. Bowel sounds active throughout. There are no masses palpable. No hepatomegaly. Rectal: Deferred Neurological: Alert and oriented to person place and time. Skin: Skin is warm and dry. No rashes noted.  Data Reviewed: I have personally reviewed following labs and imaging studies  CBC:    Latest Ref Rng & Units 07/16/2022    6:50 PM 07/15/2022   12:02 PM 07/15/2022    4:15 AM  CBC  WBC 4.0 - 10.5 K/uL 9.8   8.6   Hemoglobin 12.0 - 15.0 g/dL 9.4  8.4  8.4   Hematocrit 36.0 - 46.0 % 28.3  25.8  25.6   Platelets 150 - 400 K/uL 236   160     CMP:    Latest Ref Rng & Units 07/16/2022    6:47 PM 07/15/2022    4:15 AM 07/14/2022    3:54 AM  CMP  Glucose 70 - 99 mg/dL 161  93  096   BUN 8 - 23 mg/dL 26  40  45   Creatinine 0.44 - 1.00 mg/dL 0.45  4.09  8.11   Sodium 135 - 145 mmol/L 135  140  138   Potassium 3.5 - 5.1 mmol/L 3.3  4.3  4.5   Chloride 98 - 111 mmol/L 102  108  110   CO2 22 - 32 mmol/L 25  26  24    Calcium 8.9 - 10.3 mg/dL 8.4  8.5  8.2   Total Protein 6.5 - 8.1 g/dL 6.4  4.8  4.6   Total  Bilirubin 0.3 - 1.2 mg/dL 0.5  0.4  0.4   Alkaline Phos 38 - 126 U/L 58  52  37   AST 15 - 41 U/L 23  19  15    ALT 0 - 44 U/L 16  11  11      GFR: Estimated Creatinine Clearance: 33 mL/min (A) (by C-G formula based on SCr of 1.53 mg/dL (H)). Liver Function Tests: Recent Labs  Lab 07/13/22 1010 07/14/22 0354 07/15/22 0415 07/16/22 1847  AST 14* 15 19 23   ALT 11 11 11 16   ALKPHOS 39 37* 52 58  BILITOT 0.7 0.4 0.4 0.5  PROT 4.8* 4.6* 4.8* 6.4*  ALBUMIN 2.6* 2.5* 2.8* 3.6      Radiology Studies: DG Abd Portable 1 View  Result Date: 07/12/2022 CLINICAL DATA:  NG placement. EXAM: PORTABLE ABDOMEN - 1 VIEW COMPARISON:  Chest CT dated 07/12/2022. FINDINGS: Enteric tube with side port and tip below the diaphragm over the epigastric area likely in the body of the stomach. A hiatal hernia again noted and better seen on the CT. IMPRESSION: Enteric tube with tip likely in the body of the stomach. Electronically Signed   By: Elgie Collard M.D.   On: 07/12/2022 20:24   CT Angio Chest PE W and/or Wo Contrast  Result Date: 07/12/2022 CLINICAL DATA:  Recent coronary stent. Shortness of breath. Coughing up blood. EXAM: CT ANGIOGRAPHY CHEST WITH CONTRAST TECHNIQUE: Multidetector CT imaging of the chest was performed using the standard protocol during  bolus administration of intravenous contrast. Multiplanar CT image reconstructions and MIPs were obtained to evaluate the vascular anatomy. RADIATION DOSE REDUCTION: This exam was performed according to the departmental dose-optimization program which includes automated exposure control, adjustment of the mA and/or kV according to patient size and/or use of iterative reconstruction technique. CONTRAST:  50mL OMNIPAQUE IOHEXOL 350 MG/ML SOLN COMPARISON:  None Available. FINDINGS: Cardiovascular: There is significant breathing motion. This limits evaluation for pulmonary emboli. Nondiagnostic for small and peripheral emboli. No large or central embolus  identified. Coronary artery calcifications are noted. There are coronary stents identified. Trace pericardial fluid. The heart is nonenlarged. The thoracic aorta has a normal course and caliber with mild atherosclerotic calcified plaque. There is a bovine type aortic arch, a normal variant. Mediastinum/Nodes: Large hiatal hernia. There is patulous fluid-filled esophagus in the thorax. Mild presumed mucosal enhancement within the distal esophagus posteriorly on series 4, image 52. No specific abnormal lymph node enlargement identified in the axillary region, hilum or mediastinum. Lungs/Pleura: Diffuse breathing motion. No consolidation, pneumothorax or effusion. There is some dependent atelectasis in the lung bases. Upper Abdomen: The adrenal glands are preserved in the upper abdomen. There is made of moderate debris in the stomach. Diffuse colonic stool. Left-sided colonic diverticula seen at the edge of the imaging field. Normal retrocecal appendix. Musculoskeletal: Mild degenerative changes seen along the spine. Review of the MIP images confirms the above findings. IMPRESSION: Large hiatal hernia. There is a dilated fluid-filled esophagus. Submucosal hyperenhancement as well along the lower esophagus. Further workup when clinically appropriate. Significant breathing motion significantly limiting evaluation for pulmonary emboli. No large or central embolus. Trace pericardial fluid. Aortic Atherosclerosis (ICD10-I70.0). Electronically Signed   By: Karen Kays M.D.   On: 07/12/2022 16:49      Edman Circle, MD 07/17/2022, 10:46 AM  Cc: [name], Raymond J, MD

## 2022-07-18 ENCOUNTER — Telehealth: Payer: Self-pay | Admitting: Internal Medicine

## 2022-07-18 ENCOUNTER — Other Ambulatory Visit (INDEPENDENT_AMBULATORY_CARE_PROVIDER_SITE_OTHER): Payer: Medicare Other

## 2022-07-18 ENCOUNTER — Encounter: Payer: Self-pay | Admitting: Cardiology

## 2022-07-18 DIAGNOSIS — K449 Diaphragmatic hernia without obstruction or gangrene: Secondary | ICD-10-CM | POA: Diagnosis not present

## 2022-07-18 DIAGNOSIS — K922 Gastrointestinal hemorrhage, unspecified: Secondary | ICD-10-CM

## 2022-07-18 LAB — CBC WITH DIFFERENTIAL/PLATELET
Basophils Absolute: 0 10*3/uL (ref 0.0–0.1)
Basophils Relative: 0.5 % (ref 0.0–3.0)
Eosinophils Absolute: 0.3 10*3/uL (ref 0.0–0.7)
Eosinophils Relative: 3.3 % (ref 0.0–5.0)
HCT: 26.7 % — ABNORMAL LOW (ref 36.0–46.0)
Hemoglobin: 9 g/dL — ABNORMAL LOW (ref 12.0–15.0)
Lymphocytes Relative: 25.1 % (ref 12.0–46.0)
Lymphs Abs: 2 10*3/uL (ref 0.7–4.0)
MCHC: 33.6 g/dL (ref 30.0–36.0)
MCV: 92.4 fl (ref 78.0–100.0)
Monocytes Absolute: 0.7 10*3/uL (ref 0.1–1.0)
Monocytes Relative: 9 % (ref 3.0–12.0)
Neutro Abs: 5 10*3/uL (ref 1.4–7.7)
Neutrophils Relative %: 62.1 % (ref 43.0–77.0)
Platelets: 267 10*3/uL (ref 150.0–400.0)
RBC: 2.89 Mil/uL — ABNORMAL LOW (ref 3.87–5.11)
RDW: 14.4 % (ref 11.5–15.5)
WBC: 8.1 10*3/uL (ref 4.0–10.5)

## 2022-07-18 NOTE — Telephone Encounter (Signed)
Returned call to patient.  Patient reports she was started on Losartan  QD at last OV on 06/24/22 for elevated BP (144/89, HR 88). Then on 07/12/22 she started vomiting blood and was admitted to hospital for upper GI bleed. BP medications were held during hospitalization and then resumed at discharge.  Patient also went to ED for dark stool on 07/16/22, no changes made. BP 110/55, HR 73.  Patient saw gastroenterologist Dr. Chales Abrahams yesterday 07/17/22, her BP at that visit was 90/44, HR 96.  Patient reports BP this morning before medications was 100/57, HR 63. Then she took her Losartan  and Verapamil , and about an 1-2 hours later her BP was 92/44.  Patient complains of SOB and lightheadedness when moving around, resolved by sitting and resting.  Explained to patient these symptoms can be related to blood loss r/t recent GI bleed, along with low BP. Advised patient to hold Losartan for now, continue to monitor BP over the next week and report readings to our office. Encouraged patient to maintain adequate hydration and change positions slowly. Patient verbalized understanding.  CBC and CMP being followed by Dr. Chales Abrahams, labs are being drawn today and again on 4/29 and 5/2.  Will forward to Jari Favre, PA-C to review and advise.

## 2022-07-18 NOTE — Telephone Encounter (Signed)
Error

## 2022-07-18 NOTE — Telephone Encounter (Signed)
Pt c/o BP issue: STAT if pt c/o blurred vision, one-sided weakness or slurred speech  1. What are your last 5 BP readings?   100/42  HR 63 (this morning) 90/44 yesterday  2. Are you having any other symptoms (ex. Dizziness, headache, blurred vision, passed out)?   Headache, lightheaded when moving around.  3. What is your BP issue?    Patient stated she was put on medication due to her bleeding ulcer.  Patient is concerned this may be related to her BP medication.

## 2022-07-19 NOTE — Telephone Encounter (Signed)
Spoke with patient and shared Jari Favre, PA-C's response:  She can hold her losartan for now and continue to track BP.  Thanks! Sharlene Dory, PA-C    Patient verbalized understanding and reports she is feeling better today. Advised patient to call if any questions or concerns prior to visit with Dr. Anne Fu on 10/24/22.

## 2022-07-23 ENCOUNTER — Other Ambulatory Visit (INDEPENDENT_AMBULATORY_CARE_PROVIDER_SITE_OTHER): Payer: Medicare Other

## 2022-07-23 DIAGNOSIS — K449 Diaphragmatic hernia without obstruction or gangrene: Secondary | ICD-10-CM

## 2022-07-23 DIAGNOSIS — K922 Gastrointestinal hemorrhage, unspecified: Secondary | ICD-10-CM | POA: Diagnosis not present

## 2022-07-23 LAB — CBC WITH DIFFERENTIAL/PLATELET
Basophils Absolute: 0.1 10*3/uL (ref 0.0–0.1)
Basophils Relative: 0.9 % (ref 0.0–3.0)
Eosinophils Absolute: 0.2 10*3/uL (ref 0.0–0.7)
Eosinophils Relative: 2.6 % (ref 0.0–5.0)
HCT: 30.7 % — ABNORMAL LOW (ref 36.0–46.0)
Hemoglobin: 10.3 g/dL — ABNORMAL LOW (ref 12.0–15.0)
Lymphocytes Relative: 20.2 % (ref 12.0–46.0)
Lymphs Abs: 1.4 10*3/uL (ref 0.7–4.0)
MCHC: 33.4 g/dL (ref 30.0–36.0)
MCV: 93 fl (ref 78.0–100.0)
Monocytes Absolute: 0.7 10*3/uL (ref 0.1–1.0)
Monocytes Relative: 10 % (ref 3.0–12.0)
Neutro Abs: 4.7 10*3/uL (ref 1.4–7.7)
Neutrophils Relative %: 66.3 % (ref 43.0–77.0)
Platelets: 342 10*3/uL (ref 150.0–400.0)
RBC: 3.3 Mil/uL — ABNORMAL LOW (ref 3.87–5.11)
RDW: 14.7 % (ref 11.5–15.5)
WBC: 7 10*3/uL (ref 4.0–10.5)

## 2022-07-23 LAB — COMPREHENSIVE METABOLIC PANEL
ALT: 9 U/L (ref 0–35)
AST: 13 U/L (ref 0–37)
Albumin: 3.6 g/dL (ref 3.5–5.2)
Alkaline Phosphatase: 66 U/L (ref 39–117)
BUN: 16 mg/dL (ref 6–23)
CO2: 27 mEq/L (ref 19–32)
Calcium: 9.5 mg/dL (ref 8.4–10.5)
Chloride: 100 mEq/L (ref 96–112)
Creatinine, Ser: 1.72 mg/dL — ABNORMAL HIGH (ref 0.40–1.20)
GFR: 28.85 mL/min — ABNORMAL LOW (ref >60.00)
Glucose, Bld: 97 mg/dL (ref 70–99)
Potassium: 4.5 mEq/L (ref 3.5–5.1)
Sodium: 137 mEq/L (ref 135–145)
Total Bilirubin: 0.4 mg/dL (ref 0.2–1.2)
Total Protein: 6.3 g/dL (ref 6.0–8.3)

## 2022-07-24 ENCOUNTER — Ambulatory Visit (HOSPITAL_BASED_OUTPATIENT_CLINIC_OR_DEPARTMENT_OTHER): Payer: Medicare Other | Admitting: Family Medicine

## 2022-07-24 ENCOUNTER — Other Ambulatory Visit: Payer: Self-pay | Admitting: Gastroenterology

## 2022-07-25 ENCOUNTER — Other Ambulatory Visit (INDEPENDENT_AMBULATORY_CARE_PROVIDER_SITE_OTHER): Payer: Medicare Other

## 2022-07-25 DIAGNOSIS — K449 Diaphragmatic hernia without obstruction or gangrene: Secondary | ICD-10-CM

## 2022-07-25 DIAGNOSIS — K922 Gastrointestinal hemorrhage, unspecified: Secondary | ICD-10-CM

## 2022-07-25 LAB — CBC WITH DIFFERENTIAL/PLATELET
Basophils Absolute: 0 10*3/uL (ref 0.0–0.1)
Basophils Relative: 0.4 % (ref 0.0–3.0)
Eosinophils Absolute: 0.2 10*3/uL (ref 0.0–0.7)
Eosinophils Relative: 3.1 % (ref 0.0–5.0)
HCT: 28.5 % — ABNORMAL LOW (ref 36.0–46.0)
Hemoglobin: 9.8 g/dL — ABNORMAL LOW (ref 12.0–15.0)
Lymphocytes Relative: 20.3 % (ref 12.0–46.0)
Lymphs Abs: 1.4 10*3/uL (ref 0.7–4.0)
MCHC: 34.2 g/dL (ref 30.0–36.0)
MCV: 92.4 fl (ref 78.0–100.0)
Monocytes Absolute: 0.5 10*3/uL (ref 0.1–1.0)
Monocytes Relative: 7.1 % (ref 3.0–12.0)
Neutro Abs: 4.8 10*3/uL (ref 1.4–7.7)
Neutrophils Relative %: 69.1 % (ref 43.0–77.0)
Platelets: 318 10*3/uL (ref 150.0–400.0)
RBC: 3.09 Mil/uL — ABNORMAL LOW (ref 3.87–5.11)
RDW: 14.4 % (ref 11.5–15.5)
WBC: 7 10*3/uL (ref 4.0–10.5)

## 2022-07-26 ENCOUNTER — Other Ambulatory Visit (HOSPITAL_BASED_OUTPATIENT_CLINIC_OR_DEPARTMENT_OTHER): Payer: Self-pay

## 2022-07-29 ENCOUNTER — Other Ambulatory Visit (HOSPITAL_BASED_OUTPATIENT_CLINIC_OR_DEPARTMENT_OTHER): Payer: Self-pay

## 2022-07-30 ENCOUNTER — Encounter: Payer: Self-pay | Admitting: Gastroenterology

## 2022-07-30 ENCOUNTER — Ambulatory Visit: Payer: Medicare Other | Admitting: Gastroenterology

## 2022-07-30 VITALS — BP 130/62 | HR 73 | Ht 66.0 in | Wt 161.0 lb

## 2022-07-30 DIAGNOSIS — K449 Diaphragmatic hernia without obstruction or gangrene: Secondary | ICD-10-CM

## 2022-07-30 DIAGNOSIS — K922 Gastrointestinal hemorrhage, unspecified: Secondary | ICD-10-CM

## 2022-07-30 NOTE — Progress Notes (Signed)
Chief Complaint:   Referring Provider:  [name], Raymond J, MD      ASSESSMENT AND PLAN;   #1. UGI bleed d/t 10mm mid esophageal ulcer on EGD 07/13/2022  #2. Large 5 cm HH (seen Dr Daphine Deutscher) with Barrett's esophagus.  No masses or nodularity and Barrett's esophagus.  Failed fundoplication 2011  #3. CAD s/p recent DES 04/29/2022 (on bASA/plavix)  Plan: -Continue protonix 40 BID -CBC, CMP in 4 weeks -Continue Iron 1 tab po qd (may turn stool dark) -Miralax 17g po QD (for constipation) -FU in Aug 2024 -EGD/colonoscopy (Sept 2024), preferably when she is off Plavix (getting off Aug 2024). Needs cardio clearence   HPI:    Sara [name] is a 77 y.o. female  Patient of Dr. Christella Hartigan With hypertension, hyperlipidemia, CKD stage III, CAD status post DES stent to mid RCA on 04/29/22 on (bASA/clopidogrel), hiatal hernia status post failed fundoplication 2011, gastritis, esophagitis, Barrett's esophagus  FU from recent adm  Adm with hematemesis. Hb 14 (05/2022) to 6.9 s/p 1U to 8.4.  EGD 07/13/2022 identified a 5 cm hiatal hernia, and esophageal ulcer without stigmata of recent bleeding and evidence of Barrett's esophagus. Stomach and duodenum were normal. On PPI bid. Plavix restarted post EGD.  Started 65mg  po QD Got constipated- taking miralax Hb 9.4. Stopped Carafate.  Does feel somewhat better.  Stool has cleared.  No nausea, vomiting, hematemesis, dysphagia or odynophagia.  No fever or chills.  Has history of chronic constipation for which she uses MiraLAX.  We have gone over recent EGD, CT Abdo/pelvis, labs etc.  She is under close cardiology follow-up as well.  It has been recommended to continue baby aspirin/Plavix for now.  She has seen Dr. Daphine Deutscher for surgical repair of hiatal hernia last year-recommended conservative management.     Latest Ref Rng & Units 07/25/2022    9:10 AM 07/23/2022    9:07 AM 07/18/2022    9:30 AM  CBC  WBC 4.0 - 10.5 K/uL 7.0  7.0  8.1    Hemoglobin 12.0 - 15.0 g/dL 9.8  16.1  9.0   Hematocrit 36.0 - 46.0 % 28.5  30.7  26.7 Repeated and verified X2.   Platelets 150.0 - 400.0 K/uL 318.0  342.0  267.0      Past GI workup: EGD by Dr. Elnoria Howard 07/13/2022: - Esophageal ulcer with no stigmata of recent bleeding.  - 5 cm hiatal hernia.  - Gastroesophageal flap valve classified as Hill Grade IV (no fold, wide open lumen, hiatal hernia present).  - Esophageal mucosal changes classified as Barrett's stage C13-M15 per Prague criteria.  - Normal stomach.  - Normal examined duodenum.  - No specimens collected.  CTA 07/12/2022 chest Large hiatal hernia. There is a dilated fluid-filled esophagus. Submucosal hyperenhancement as well along the lower esophagus. Further workup when clinically appropriate. Significant breathing motion significantly limiting evaluation for pulmonary emboli. No large or central embolus.  UGI series 07/2021 1. Small to moderate recurrent hiatal hernia. 2. Severe spontaneous gastroesophageal reflux, noting undigested food debris throughout the refluxed material in the esophagus. 3. Moderate esophageal dysmotility, compatible with chronic reflux related dysmotility. 4. Small periampullary duodenal diverticulum. Otherwise normal upper GI. No masses or strictures.  01/2018 colonoscopy Dr. Christella Hartigan 15 mm polyp cecum 3 mm polyp descending colon diverticula  12/10/2019 endoscopy for indigestion, remote fundoplication for large HH, Barrett's sowed significantly anatomical abnormal proximal stomach metaplasia is intact however the diaphragm is a medium size hiatal hernia free reflux of gastric  secretion in the esophagus no retained solid or liquid food in the stomach nonspecific mild distal gastritis esophagus torturous GE junction single erosion, Barrett's appearing mucosa up to 25 cm mild reflux related esophagitis Pathology showed Barrett's changes without dysplasia recall 3 years.    Past Medical History:   Diagnosis Date   Anxiety 07/2020   Aortic dilatation (HCC) 03/25/2022   38 mm noted on echo   Arthritis 11/2017   Barrett's esophagus    CAD (coronary artery disease)    Cataract    had surgery   Chronic kidney disease, stage 3a (HCC)    GERD (gastroesophageal reflux disease)    hx of    HIATAL HERNIA    s/p surgical repair   Hyperlipidemia    HYPERTENSION    Kidney stone    1990s   Known medical problems 07/12/2022   LVH (left ventricular hypertrophy)    Osteoporosis    PAT (paroxysmal atrial tachycardia)    Premature atrial contractions     Past Surgical History:  Procedure Laterality Date   ABDOMINAL HYSTERECTOMY  06/12/2012   CATARACT EXTRACTION  2009   both eyes   CHOLECYSTECTOMY  11/02/2012   Procedure: CHOLECYSTECTOMY;  Surgeon: Valarie Merino, MD;  Location: [facility];  Service: General;;   COLOSTOMY     CORONARY STENT INTERVENTION N/A 04/29/2022   Procedure: CORONARY STENT INTERVENTION;  Surgeon: Yvonne Kendall, MD;  Location: [facility];  Service: Cardiovascular;  Laterality: N/A;   CORONARY ULTRASOUND/IVUS N/A 04/29/2022   Procedure: Intravascular Ultrasound/IVUS;  Surgeon: Yvonne Kendall, MD;  Location: [facility];  Service: Cardiovascular;  Laterality: N/A;   CYSTOCELE REPAIR N/A 06/12/2012   Procedure: ANTERIOR REPAIR (CYSTOCELE);  Surgeon: Robley Fries, MD;  Location: [facility];  Service: Gynecology;  Laterality: N/A;   ESOPHAGOGASTRODUODENOSCOPY (EGD) WITH PROPOFOL N/A 07/13/2022   Procedure: ESOPHAGOGASTRODUODENOSCOPY (EGD) WITH PROPOFOL;  Surgeon: Jeani Hawking, MD;  Location: [facility];  Service: Gastroenterology;  Laterality: N/A;   EYE SURGERY  Cataract removal 2009   HERNIA REPAIR  2011   HIATAL HERNIA REPAIR  2011   LITHOTRIPSY     RIGHT/LEFT HEART CATH AND CORONARY ANGIOGRAPHY N/A 04/29/2022   Procedure: RIGHT/LEFT HEART CATH AND CORONARY ANGIOGRAPHY;  Surgeon: Yvonne Kendall, MD;  Location: [facility];  Service:  Cardiovascular;  Laterality: N/A;   TONSILLECTOMY  1968   UPPER GASTROINTESTINAL ENDOSCOPY     UPPER GI ENDOSCOPY  11/02/2012   Procedure: UPPER GI ENDOSCOPY;  Surgeon: Valarie Merino, MD;  Location: [facility];  Service: General;;   VAGINAL HYSTERECTOMY N/A 06/12/2012   Procedure: HYSTERECTOMY VAGINAL;  Surgeon: Robley Fries, MD;  Location: [facility];  Service: Gynecology;  Laterality: N/A;    Family History  Problem Relation Age of Onset   Breast cancer Mother 33       with mets    Cancer Mother    Hypertension Mother    Early death Father    Heart disease Father    Kidney disease Father    Hypertension Other        Parent   Breast cancer Other    Heart disease Other        parent, grandparent   Colon cancer Paternal Aunt    Breast cancer Maternal Grandmother    Asthma Maternal Grandmother    Cancer Maternal Grandmother    Cancer Paternal Uncle    Early death Paternal Uncle    Cancer Paternal Aunt  Esophageal cancer Neg Hx    Rectal cancer Neg Hx    Stomach cancer Neg Hx    Colon polyps Neg Hx     Social History   Tobacco Use   Smoking status: Never    Passive exposure: Past   Smokeless tobacco: Never  Vaping Use   Vaping Use: Never used  Substance Use Topics   Alcohol use: Never   Drug use: Never    Current Outpatient Medications  Medication Sig Dispense Refill   acetaminophen (TYLENOL) 325 MG tablet Take 2 tablets (650 mg total) by mouth every 6 (six) hours as needed for mild pain (or Fever >/= 101). 100 tablet 0   aspirin EC 81 MG tablet Take 1 tablet (81 mg total) by mouth daily. 30 tablet 12   Cholecalciferol (QC VITAMIN D3) 50 MCG (2000 UT) CAPS Take 1 capsule by mouth daily.     clopidogrel (PLAVIX) 75 MG tablet Take 1 tablet (75 mg total) by mouth daily. 30 tablet 11   cyanocobalamin (VITAMIN B12) 1000 MCG tablet Take 1,000 mcg by mouth daily.     furosemide (LASIX) 20 MG tablet Take 1 tablet by mouth daily for 3 days, then take as needed for fluid  (Patient taking differently: Take 20 mg by mouth daily as needed for edema.) 30 tablet 3   losartan (COZAAR) 25 MG tablet Take 1 tablet (25 mg total) by mouth daily. 90 tablet 3   methocarbamol (ROBAXIN) 500 MG tablet Take 1-2 tablets (500-1,000 mg total) by mouth every 6 to 8 hours as needed for muscle spasms/tension. 60 tablet 2   Multiple Vitamin (MULTIVITAMIN WITH MINERALS) TABS Take 1 tablet by mouth daily.     nitroGLYCERIN (NITROSTAT) 0.4 MG SL tablet Place 1 tablet (0.4 mg total) under the tongue every 5 (five) minutes as needed for chest pain. 25 tablet 3   ondansetron (ZOFRAN) 4 MG tablet Take 1 tablet (4 mg total) by mouth every 6 (six) hours as needed for nausea. 20 tablet 0   pantoprazole (PROTONIX) 40 MG tablet Take 1 tablet (40 mg total) by mouth 2 (two) times daily. 180 tablet 3   polyethylene glycol powder (GLYCOLAX/MIRALAX) 17 GM/SCOOP powder Take 17 g by mouth daily as needed for mild constipation. 238 g 0   Probiotic Product (PROBIOTIC DAILY PO) Take 1 capsule by mouth daily.     rosuvastatin (CRESTOR) 5 MG tablet Take 1 tablet (5 mg total) by mouth daily. 90 tablet 3   sucralfate (CARAFATE) 1 GM/10ML suspension Take 10 mLs (1 g total) by mouth 2 (two) times daily. For 14 days 280 mL 0   verapamil (CALAN-SR) 180 MG CR tablet Take 1 tablet (180 mg total) by mouth daily. 90 tablet 3   No current facility-administered medications for this visit.    Allergies  Allergen Reactions   Augmentin [Amoxicillin-Pot Clavulanate] Diarrhea   Metoprolol     Had prior to cor CT, felt very dizzy   Nsaids     Avoid due to diminished kidney function    Review of Systems:  neg     Physical Exam:    Ht 5\' 6"  (1.676 m)   Wt 161 lb (73 kg)   LMP  (LMP Unknown)   BMI 25.99 kg/m  Wt Readings from Last 3 Encounters:  07/30/22 161 lb (73 kg)  07/17/22 165 lb (74.8 kg)  07/12/22 164 lb (74.4 kg)   Constitutional:  Well-developed, in no acute distress. Psychiatric: Normal mood and  affect.  Behavior is normal. Cardiovascular: Normal rate, regular rhythm. No edema Pulmonary/chest: Effort normal and breath sounds normal. No wheezing, rales or rhonchi. Abdominal: Soft, nondistended. Nontender. Bowel sounds active throughout. There are no masses palpable. No hepatomegaly. Rectal: Deferred Neurological: Alert and oriented to person place and time. Skin: Skin is warm and dry. No rashes noted.  Data Reviewed: I have personally reviewed following labs and imaging studies  CBC:    Latest Ref Rng & Units 07/25/2022    9:10 AM 07/23/2022    9:07 AM 07/18/2022    9:30 AM  CBC  WBC 4.0 - 10.5 K/uL 7.0  7.0  8.1   Hemoglobin 12.0 - 15.0 g/dL 9.8  40.9  9.0   Hematocrit 36.0 - 46.0 % 28.5  30.7  26.7 Repeated and verified X2.   Platelets 150.0 - 400.0 K/uL 318.0  342.0  267.0     CMP:    Latest Ref Rng & Units 07/23/2022    9:07 AM 07/16/2022    6:47 PM 07/15/2022    4:15 AM  CMP  Glucose 70 - 99 mg/dL 97  811  93   BUN 6 - 23 mg/dL 16  26  40   Creatinine 0.40 - 1.20 mg/dL 9.14  7.82  9.56   Sodium 135 - 145 mEq/L 137  135  140   Potassium 3.5 - 5.1 mEq/L 4.5  3.3  4.3   Chloride 96 - 112 mEq/L 100  102  108   CO2 19 - 32 mEq/L 27  25  26    Calcium 8.4 - 10.5 mg/dL 9.5  8.4  8.5   Total Protein 6.0 - 8.3 g/dL 6.3  6.4  4.8   Total Bilirubin 0.2 - 1.2 mg/dL 0.4  0.5  0.4   Alkaline Phos 39 - 117 U/L 66  58  52   AST 0 - 37 U/L 13  23  19    ALT 0 - 35 U/L 9  16  11      GFR: Estimated Creatinine Clearance: 28.9 mL/min (A) (by C-G formula based on SCr of 1.72 mg/dL (H)). Liver Function Tests: Recent Labs  Lab 07/23/22 0907  AST 13  ALT 9  ALKPHOS 66  BILITOT 0.4  PROT 6.3  ALBUMIN 3.6      Radiology Studies: DG Abd Portable 1 View  Result Date: 07/12/2022 CLINICAL DATA:  NG placement. EXAM: PORTABLE ABDOMEN - 1 VIEW COMPARISON:  Chest CT dated 07/12/2022. FINDINGS: Enteric tube with side port and tip below the diaphragm over the epigastric area likely in  the body of the stomach. A hiatal hernia again noted and better seen on the CT. IMPRESSION: Enteric tube with tip likely in the body of the stomach. Electronically Signed   By: Elgie Collard M.D.   On: 07/12/2022 20:24   CT Angio Chest PE W and/or Wo Contrast  Result Date: 07/12/2022 CLINICAL DATA:  Recent coronary stent. Shortness of breath. Coughing up blood. EXAM: CT ANGIOGRAPHY CHEST WITH CONTRAST TECHNIQUE: Multidetector CT imaging of the chest was performed using the standard protocol during bolus administration of intravenous contrast. Multiplanar CT image reconstructions and MIPs were obtained to evaluate the vascular anatomy. RADIATION DOSE REDUCTION: This exam was performed according to the departmental dose-optimization program which includes automated exposure control, adjustment of the mA and/or kV according to patient size and/or use of iterative reconstruction technique. CONTRAST:  50mL OMNIPAQUE IOHEXOL 350 MG/ML SOLN COMPARISON:  None Available. FINDINGS: Cardiovascular: There is significant breathing motion. This  limits evaluation for pulmonary emboli. Nondiagnostic for small and peripheral emboli. No large or central embolus identified. Coronary artery calcifications are noted. There are coronary stents identified. Trace pericardial fluid. The heart is nonenlarged. The thoracic aorta has a normal course and caliber with mild atherosclerotic calcified plaque. There is a bovine type aortic arch, a normal variant. Mediastinum/Nodes: Large hiatal hernia. There is patulous fluid-filled esophagus in the thorax. Mild presumed mucosal enhancement within the distal esophagus posteriorly on series 4, image 52. No specific abnormal lymph node enlargement identified in the axillary region, hilum or mediastinum. Lungs/Pleura: Diffuse breathing motion. No consolidation, pneumothorax or effusion. There is some dependent atelectasis in the lung bases. Upper Abdomen: The adrenal glands are preserved in the  upper abdomen. There is made of moderate debris in the stomach. Diffuse colonic stool. Left-sided colonic diverticula seen at the edge of the imaging field. Normal retrocecal appendix. Musculoskeletal: Mild degenerative changes seen along the spine. Review of the MIP images confirms the above findings. IMPRESSION: Large hiatal hernia. There is a dilated fluid-filled esophagus. Submucosal hyperenhancement as well along the lower esophagus. Further workup when clinically appropriate. Significant breathing motion significantly limiting evaluation for pulmonary emboli. No large or central embolus. Trace pericardial fluid. Aortic Atherosclerosis (ICD10-I70.0). Electronically Signed   By: Karen Kays M.D.   On: 07/12/2022 16:49      Edman Circle, MD 07/30/2022, 8:48 AM  Cc: [name], Raymond J, MD

## 2022-07-30 NOTE — Patient Instructions (Addendum)
_______________________________________________________  If your blood pressure at your visit was 140/90 or greater, please contact your primary care physician to follow up on this.  _______________________________________________________  If you are age 75 or older, your body mass index should be between 23-30. Your Body mass index is 25.99 kg/m. If this is out of the aforementioned range listed, please consider follow up with your Primary Care Provider.  If you are age 66 or younger, your body mass index should be between 19-25. Your Body mass index is 25.99 kg/m. If this is out of the aformentioned range listed, please consider follow up with your Primary Care Provider.   ________________________________________________________  The [facility] providers would like to encourage you to use [facility] to communicate with providers for non-urgent requests or questions.  Due to long hold times on the telephone, sending your provider a message by [facility] may be a faster and more efficient way to get a response.  Please allow 48 business hours for a response.  Please remember that this is for non-urgent requests.  _______________________________________________________  Your provider has requested that you go to the basement level for lab work after 08-30-2022. Press "B" on the elevator. The lab is located at the first door on the left as you exit the elevator.  Continue Protonix and iron supplement. Stop carafate  Please purchase the following medications over the counter and take as directed: Miralax 17g daily  You have been scheduled for an appointment with Dr. Chales Abrahams on 11-04-2022 at 830am . Please arrive 10 minutes early for your appointment.  Procedure for 11-29-2022 arrival at 7am for an 8am appointment.  Thank you,  Dr. Lynann Bologna

## 2022-08-03 ENCOUNTER — Other Ambulatory Visit (HOSPITAL_BASED_OUTPATIENT_CLINIC_OR_DEPARTMENT_OTHER): Payer: Self-pay

## 2022-08-05 ENCOUNTER — Other Ambulatory Visit (HOSPITAL_BASED_OUTPATIENT_CLINIC_OR_DEPARTMENT_OTHER): Payer: Self-pay

## 2022-08-05 ENCOUNTER — Other Ambulatory Visit: Payer: Self-pay

## 2022-08-05 ENCOUNTER — Telehealth (HOSPITAL_BASED_OUTPATIENT_CLINIC_OR_DEPARTMENT_OTHER): Payer: Self-pay | Admitting: Family Medicine

## 2022-08-05 NOTE — Telephone Encounter (Signed)
Called patient to schedule Medicare Annual Wellness Visit (AWV). Left message for patient to call back and schedule Medicare Annual Wellness Visit (AWV).  Last date of AWV: 05/20/2019   Please schedule an appointment at any time with Abby, NHA. .  If any questions, please contact me at [phone].  Thank you,  [name],  AMB Clinical Support [facility] AWV Program Direct Dial ??[phone]

## 2022-08-05 NOTE — Telephone Encounter (Signed)
Patient called back asking for me to call her back in July 2025.  She just recently got out of the hospital and has a lot of follow up visits  Thank you,  [name],  AMB Clinical Support [facility] AWV Program Direct Dial ??[phone]

## 2022-08-07 ENCOUNTER — Other Ambulatory Visit (HOSPITAL_BASED_OUTPATIENT_CLINIC_OR_DEPARTMENT_OTHER): Payer: Self-pay

## 2022-08-08 ENCOUNTER — Other Ambulatory Visit (HOSPITAL_BASED_OUTPATIENT_CLINIC_OR_DEPARTMENT_OTHER): Payer: Self-pay

## 2022-08-08 MED ORDER — SUCRALFATE 1 G PO TABS
ORAL_TABLET | ORAL | 3 refills | Status: DC
  Filled 2022-08-08: qty 60, 30d supply, fill #0

## 2022-08-12 ENCOUNTER — Other Ambulatory Visit (HOSPITAL_BASED_OUTPATIENT_CLINIC_OR_DEPARTMENT_OTHER): Payer: Self-pay

## 2022-08-13 ENCOUNTER — Encounter (HOSPITAL_BASED_OUTPATIENT_CLINIC_OR_DEPARTMENT_OTHER): Payer: Self-pay | Admitting: Family Medicine

## 2022-08-13 ENCOUNTER — Other Ambulatory Visit (HOSPITAL_BASED_OUTPATIENT_CLINIC_OR_DEPARTMENT_OTHER): Payer: Self-pay | Admitting: Family Medicine

## 2022-08-13 ENCOUNTER — Ambulatory Visit (INDEPENDENT_AMBULATORY_CARE_PROVIDER_SITE_OTHER): Payer: Medicare Other | Admitting: Family Medicine

## 2022-08-13 VITALS — BP 131/70 | HR 81 | Temp 97.6°F | Ht 66.0 in | Wt 159.8 lb

## 2022-08-13 DIAGNOSIS — N183 Chronic kidney disease, stage 3 unspecified: Secondary | ICD-10-CM | POA: Diagnosis not present

## 2022-08-13 DIAGNOSIS — D649 Anemia, unspecified: Secondary | ICD-10-CM | POA: Diagnosis not present

## 2022-08-13 HISTORY — DX: Anemia, unspecified: D64.9

## 2022-08-13 NOTE — Assessment & Plan Note (Signed)
Elevation noted in the past, however has been more progressive since recent health issues resulting in hospitalization.  Unsure if new labs indicate new baseline after recent acute on chronic kidney injury.  Patient has been working to maintain adequate hydration.  Has been addressing observed anemia as above.  She does have prescription for furosemide, however only takes this on an as-needed basis and has not been taking recently Uncertain etiology for observed worsening of CKD.  We will recheck labs today as prior labs had been showing gradual improvement in creatinine, however did have slight worsening on last labs about 3 weeks ago.  If improvement seen on labs at this time, likely can continue with monitoring.  If worsening, would proceed with additional evaluation, likely with additional labs

## 2022-08-13 NOTE — Progress Notes (Signed)
    Procedures performed today:    None.  Independent interpretation of notes and tests performed by another provider:   None.  Brief History, Exam, Impression, and Recommendations:    BP 131/70 (BP Location: Left Arm, Patient Position: Sitting, Cuff Size: Normal)   Pulse 81   Temp 97.6 F (36.4 C) (Oral)   Ht 5\' 6"  (1.676 m)   Wt 159 lb 12.8 oz (72.5 kg)   LMP  (LMP Unknown)   SpO2 100%   BMI 25.79 kg/m   Earlier this year, patient did have coronary stent placed.  Unfortunately, about 1 month ago, she did develop upper GI bleed and is still in process of recovery regarding this.  Anemia About 1 month ago, patient was admitted to the hospital related to hematemesis upper GI bleed, AKI.  She has had treatment and evaluation with gastroenterology.  She most recently saw a GI specialist earlier this month with plan for repeat labs and follow-up in the coming months.  They are also planning to complete EGD and colonoscopy in September 2024.  Today, patient reports that she is doing well, does not have any concerns related to this currently. She does continue to have some intermittent dizziness at times which is felt to be related to ongoing anemia.  She has been taking oral iron supplement, taking this twice daily.  Denies any side effects from medication.  Iron stores checked about 1 month ago were generally normal.  Hemoglobin has been slow to respond as well as red blood cell count.  She is currently having to take twice daily PPI due to upper GI bleed/gastric ulcer.  Reports that this can lead to impaired iron absorption. Today, we will recheck iron studies in order to compare to prior levels to assess for any improvement.  Discussed that we may need to consider evaluation with hematology for consideration of iron infusion as she may not be absorbing oral iron optimally.  We will follow-up on lab results to determine next step in management  CKD (chronic kidney disease) stage 3, GFR  30-59 ml/min (HCC) Elevation noted in the past, however has been more progressive since recent health issues resulting in hospitalization.  Unsure if new labs indicate new baseline after recent acute on chronic kidney injury.  Patient has been working to maintain adequate hydration.  Has been addressing observed anemia as above.  She does have prescription for furosemide, however only takes this on an as-needed basis and has not been taking recently Uncertain etiology for observed worsening of CKD.  We will recheck labs today as prior labs had been showing gradual improvement in creatinine, however did have slight worsening on last labs about 3 weeks ago.  If improvement seen on labs at this time, likely can continue with monitoring.  If worsening, would proceed with additional evaluation, likely with additional labs  Return in about 4 weeks (around 09/10/2022) for anemia.  Spent 44 minutes on this patient encounter, including preparation, chart review, face-to-face counseling with patient and coordination of care, and documentation of encounter   ___________________________________________ Niala Stcharles [name], MD, ABFM, [facility] Primary Care and Sports Medicine [facility]

## 2022-08-13 NOTE — Assessment & Plan Note (Signed)
About 1 month ago, patient was admitted to the hospital related to hematemesis upper GI bleed, AKI.  She has had treatment and evaluation with gastroenterology.  She most recently saw a GI specialist earlier this month with plan for repeat labs and follow-up in the coming months.  They are also planning to complete EGD and colonoscopy in September 2024.  Today, patient reports that she is doing well, does not have any concerns related to this currently. She does continue to have some intermittent dizziness at times which is felt to be related to ongoing anemia.  She has been taking oral iron supplement, taking this twice daily.  Denies any side effects from medication.  Iron stores checked about 1 month ago were generally normal.  Hemoglobin has been slow to respond as well as red blood cell count.  She is currently having to take twice daily PPI due to upper GI bleed/gastric ulcer.  Reports that this can lead to impaired iron absorption. Today, we will recheck iron studies in order to compare to prior levels to assess for any improvement.  Discussed that we may need to consider evaluation with hematology for consideration of iron infusion as she may not be absorbing oral iron optimally.  We will follow-up on lab results to determine next step in management

## 2022-08-14 LAB — BASIC METABOLIC PANEL
BUN/Creatinine Ratio: 14 (ref 12–28)
BUN: 20 mg/dL (ref 8–27)
CO2: 23 mmol/L (ref 20–29)
Calcium: 9.4 mg/dL (ref 8.7–10.3)
Chloride: 102 mmol/L (ref 96–106)
Creatinine, Ser: 1.42 mg/dL — ABNORMAL HIGH (ref 0.57–1.00)
Glucose: 97 mg/dL (ref 70–99)
Potassium: 4.5 mmol/L (ref 3.5–5.2)
Sodium: 139 mmol/L (ref 134–144)
eGFR: 39 mL/min/{1.73_m2} — ABNORMAL LOW (ref >59)

## 2022-08-14 LAB — IRON,TIBC AND FERRITIN PANEL
Ferritin: 50 ng/mL (ref 15–150)
Iron Saturation: 17 % (ref 15–55)
Iron: 47 ug/dL (ref 27–139)
Total Iron Binding Capacity: 282 ug/dL (ref 250–450)
UIBC: 235 ug/dL (ref 118–369)

## 2022-08-14 LAB — VITAMIN B12: Vitamin B-12: 1059 pg/mL (ref 232–1245)

## 2022-08-15 ENCOUNTER — Encounter (HOSPITAL_BASED_OUTPATIENT_CLINIC_OR_DEPARTMENT_OTHER): Payer: Self-pay | Admitting: Family Medicine

## 2022-08-17 ENCOUNTER — Other Ambulatory Visit (HOSPITAL_BASED_OUTPATIENT_CLINIC_OR_DEPARTMENT_OTHER): Payer: Self-pay

## 2022-08-24 ENCOUNTER — Other Ambulatory Visit: Payer: Self-pay | Admitting: Internal Medicine

## 2022-08-26 ENCOUNTER — Other Ambulatory Visit (HOSPITAL_BASED_OUTPATIENT_CLINIC_OR_DEPARTMENT_OTHER): Payer: Self-pay

## 2022-08-26 ENCOUNTER — Other Ambulatory Visit: Payer: Self-pay

## 2022-08-26 MED ORDER — VERAPAMIL HCL ER 180 MG PO TBCR
180.0000 mg | EXTENDED_RELEASE_TABLET | Freq: Every day | ORAL | 0 refills | Status: DC
  Filled 2022-08-26: qty 30, 30d supply, fill #0

## 2022-08-28 ENCOUNTER — Other Ambulatory Visit (HOSPITAL_BASED_OUTPATIENT_CLINIC_OR_DEPARTMENT_OTHER): Payer: Self-pay

## 2022-08-28 ENCOUNTER — Encounter (HOSPITAL_BASED_OUTPATIENT_CLINIC_OR_DEPARTMENT_OTHER): Payer: Self-pay | Admitting: Family Medicine

## 2022-08-28 ENCOUNTER — Ambulatory Visit (INDEPENDENT_AMBULATORY_CARE_PROVIDER_SITE_OTHER): Payer: Medicare Other | Admitting: Family Medicine

## 2022-08-28 VITALS — BP 121/67 | HR 75 | Ht 66.0 in | Wt 158.0 lb

## 2022-08-28 DIAGNOSIS — R350 Frequency of micturition: Secondary | ICD-10-CM | POA: Diagnosis not present

## 2022-08-28 DIAGNOSIS — R300 Dysuria: Secondary | ICD-10-CM

## 2022-08-28 LAB — POCT URINALYSIS DIPSTICK
Bilirubin, UA: NEGATIVE
Glucose, UA: NEGATIVE
Ketones, UA: NEGATIVE
Nitrite, UA: NEGATIVE
Protein, UA: POSITIVE — AB
Spec Grav, UA: 1.02 (ref 1.010–1.025)
Urobilinogen, UA: 0.2 E.U./dL
pH, UA: 5.5 (ref 5.0–8.0)

## 2022-08-28 MED ORDER — SULFAMETHOXAZOLE-TRIMETHOPRIM 800-160 MG PO TABS
1.0000 | ORAL_TABLET | Freq: Two times a day (BID) | ORAL | 0 refills | Status: DC
  Filled 2022-08-28: qty 6, 3d supply, fill #0

## 2022-08-28 NOTE — Assessment & Plan Note (Signed)
Urinary frequency, urine Sara [name] first noticed this morning.  She has not had any abdominal pain, no back pain, no fever, chills.  She has had prior similar symptoms a couple months ago and was found to be related to UTI.  At that time, she did take Macrobid which helped with resolution of symptoms, however she did note some headache which occurred with medication. On exam, patient is in no acute distress, vital signs stable.  Abdomen with normal bowel sounds, soft, nontender, nondistended.  No CVA tenderness. Point-of-care urine dip with positive leukocyte esterase, negative nitrite, positive protein.   Given patient's symptoms and UA findings, we will proceed with empiric antibiotic treatment with Bactrim.  We will send urine culture and adjust antibiotic therapy as indicated

## 2022-08-28 NOTE — Patient Instructions (Signed)

## 2022-08-28 NOTE — Progress Notes (Signed)
    Procedures performed today:    None.  Independent interpretation of notes and tests performed by another provider:   None.  Brief History, Exam, Impression, and Recommendations:    BP 121/67 (BP Location: Right Arm, Patient Position: Sitting, Cuff Size: Normal)   Pulse 75   Ht 5\' 6"  (1.676 m)   Wt 158 lb (71.7 kg)   LMP  (LMP Unknown)   SpO2 100%   BMI 25.50 kg/m   Urinary frequency Urinary frequency, urine Claudia first noticed this morning.  She has not had any abdominal pain, no back pain, no fever, chills.  She has had prior similar symptoms a couple months ago and was found to be related to UTI.  At that time, she did take Macrobid which helped with resolution of symptoms, however she did note some headache which occurred with medication. On exam, patient is in no acute distress, vital signs stable.  Abdomen with normal bowel sounds, soft, nontender, nondistended.  No CVA tenderness. Point-of-care urine dip with positive leukocyte esterase, negative nitrite, positive protein.   Given patient's symptoms and UA findings, we will proceed with empiric antibiotic treatment with Bactrim.  We will send urine culture and adjust antibiotic therapy as indicated  Return if symptoms worsen or fail to improve, for previously scheduled appt.   ___________________________________________ Deniro Laymon [name], MD, ABFM, [facility] Primary Care and Sports Medicine [facility]

## 2022-08-30 ENCOUNTER — Telehealth (HOSPITAL_BASED_OUTPATIENT_CLINIC_OR_DEPARTMENT_OTHER): Payer: Self-pay | Admitting: Family Medicine

## 2022-08-30 ENCOUNTER — Other Ambulatory Visit (HOSPITAL_BASED_OUTPATIENT_CLINIC_OR_DEPARTMENT_OTHER): Payer: Self-pay

## 2022-08-30 ENCOUNTER — Other Ambulatory Visit (HOSPITAL_BASED_OUTPATIENT_CLINIC_OR_DEPARTMENT_OTHER): Payer: Self-pay | Admitting: Family Medicine

## 2022-08-30 ENCOUNTER — Telehealth (HOSPITAL_BASED_OUTPATIENT_CLINIC_OR_DEPARTMENT_OTHER): Payer: Self-pay

## 2022-08-30 NOTE — Telephone Encounter (Signed)
[facility] was called in regards to patient urine culture. The urine culture is still in progress. It should be back in the next 1-2 business days.

## 2022-08-30 NOTE — Telephone Encounter (Signed)
Pt is wanting a update on the culture, she is on her last pill and needs something else called in    Please call

## 2022-09-02 LAB — URINE CULTURE

## 2022-09-03 ENCOUNTER — Other Ambulatory Visit (INDEPENDENT_AMBULATORY_CARE_PROVIDER_SITE_OTHER): Payer: Medicare Other

## 2022-09-03 DIAGNOSIS — K449 Diaphragmatic hernia without obstruction or gangrene: Secondary | ICD-10-CM | POA: Diagnosis not present

## 2022-09-03 DIAGNOSIS — K922 Gastrointestinal hemorrhage, unspecified: Secondary | ICD-10-CM

## 2022-09-03 LAB — CBC WITH DIFFERENTIAL/PLATELET
Basophils Absolute: 0.1 10*3/uL (ref 0.0–0.1)
Basophils Relative: 0.9 % (ref 0.0–3.0)
Eosinophils Absolute: 0.4 10*3/uL (ref 0.0–0.7)
Eosinophils Relative: 5.5 % — ABNORMAL HIGH (ref 0.0–5.0)
HCT: 35.8 % — ABNORMAL LOW (ref 36.0–46.0)
Hemoglobin: 11.7 g/dL — ABNORMAL LOW (ref 12.0–15.0)
Lymphocytes Relative: 29.3 % (ref 12.0–46.0)
Lymphs Abs: 2 10*3/uL (ref 0.7–4.0)
MCHC: 32.6 g/dL (ref 30.0–36.0)
MCV: 91.7 fl (ref 78.0–100.0)
Monocytes Absolute: 0.6 10*3/uL (ref 0.1–1.0)
Monocytes Relative: 9.5 % (ref 3.0–12.0)
Neutro Abs: 3.7 10*3/uL (ref 1.4–7.7)
Neutrophils Relative %: 54.8 % (ref 43.0–77.0)
Platelets: 302 10*3/uL (ref 150.0–400.0)
RBC: 3.9 Mil/uL (ref 3.87–5.11)
RDW: 13.9 % (ref 11.5–15.5)
WBC: 6.7 10*3/uL (ref 4.0–10.5)

## 2022-09-03 LAB — COMPREHENSIVE METABOLIC PANEL
ALT: 11 U/L (ref 0–35)
AST: 17 U/L (ref 0–37)
Albumin: 3.7 g/dL (ref 3.5–5.2)
Alkaline Phosphatase: 71 U/L (ref 39–117)
BUN: 19 mg/dL (ref 6–23)
CO2: 23 mEq/L (ref 19–32)
Calcium: 9.2 mg/dL (ref 8.4–10.5)
Chloride: 106 mEq/L (ref 96–112)
Creatinine, Ser: 1.43 mg/dL — ABNORMAL HIGH (ref 0.40–1.20)
GFR: 35.98 mL/min — ABNORMAL LOW (ref >60.00)
Glucose, Bld: 92 mg/dL (ref 70–99)
Potassium: 4.4 mEq/L (ref 3.5–5.1)
Sodium: 137 mEq/L (ref 135–145)
Total Bilirubin: 0.3 mg/dL (ref 0.2–1.2)
Total Protein: 6.4 g/dL (ref 6.0–8.3)

## 2022-09-05 ENCOUNTER — Other Ambulatory Visit (HOSPITAL_BASED_OUTPATIENT_CLINIC_OR_DEPARTMENT_OTHER): Payer: Self-pay | Admitting: Family Medicine

## 2022-09-05 ENCOUNTER — Other Ambulatory Visit (HOSPITAL_BASED_OUTPATIENT_CLINIC_OR_DEPARTMENT_OTHER): Payer: Self-pay

## 2022-09-05 MED ORDER — FOSFOMYCIN TROMETHAMINE 3 G PO PACK
3.0000 g | PACK | ORAL | 0 refills | Status: DC
  Filled 2022-09-05: qty 9, 6d supply, fill #0

## 2022-09-05 MED ORDER — SULFAMETHOXAZOLE-TRIMETHOPRIM 800-160 MG PO TABS
1.0000 | ORAL_TABLET | Freq: Two times a day (BID) | ORAL | 0 refills | Status: AC
  Filled 2022-09-05: qty 10, 5d supply, fill #0

## 2022-09-10 ENCOUNTER — Ambulatory Visit (HOSPITAL_BASED_OUTPATIENT_CLINIC_OR_DEPARTMENT_OTHER): Payer: Medicare Other | Admitting: Family Medicine

## 2022-09-16 ENCOUNTER — Encounter (HOSPITAL_BASED_OUTPATIENT_CLINIC_OR_DEPARTMENT_OTHER): Payer: Self-pay | Admitting: Family Medicine

## 2022-09-16 ENCOUNTER — Ambulatory Visit (INDEPENDENT_AMBULATORY_CARE_PROVIDER_SITE_OTHER): Payer: Medicare Other | Admitting: Family Medicine

## 2022-09-16 VITALS — BP 121/69 | HR 82 | Ht 66.0 in | Wt 156.0 lb

## 2022-09-16 DIAGNOSIS — R319 Hematuria, unspecified: Secondary | ICD-10-CM

## 2022-09-16 DIAGNOSIS — R0609 Other forms of dyspnea: Secondary | ICD-10-CM

## 2022-09-16 NOTE — Assessment & Plan Note (Signed)
Observed on recent urinalysis when patient was experiencing dysuria.  Suspect that this was related to diagnosed UTI.  She has not had any gross hematuria noted.  Dysuria has resolved with antibiotic therapy. Recommend recheck of urinalysis in about 3 to 4 weeks to monitor for resolution of hematuria which would be expected with treatment of UTI if this was underlying etiology

## 2022-09-16 NOTE — Patient Instructions (Signed)

## 2022-09-16 NOTE — Assessment & Plan Note (Signed)
Somewhat new issue for patient, noted over the past 2 to 3 weeks.  Mostly noted with exertion, no reported symptoms at rest, no symptoms supine.  She has had possible slight dry cough recently.  Slight twinge under lower border of rib cage on the left side, no specific chest pain otherwise.  She does have follow-up upcoming with her cardiologist in about 6 weeks. On exam, patient is in no acute distress, vital signs stable.  Cardiovascular exam with regular rate and rhythm, lungs clear to auscultation bilaterally. Discussed considerations related to symptoms, do not feel that it is coming from known anemia as this number has generally been improving and exertional symptoms are more recent in onset.  Pulmonary cause also seems less likely given normal findings in office today.  I do feel would be appropriate for patient to try to arrange for sooner follow-up with her cardiology office to exclude this as a possible cause.  Patient will reach out to cardiologist to arrange for earlier appointment

## 2022-09-16 NOTE — Progress Notes (Signed)
    Procedures performed today:    None.  Independent interpretation of notes and tests performed by another provider:   None.  Brief History, Exam, Impression, and Recommendations:    BP 121/69 (BP Location: Right Arm, Patient Position: Sitting, Cuff Size: Normal)   Pulse 82   Ht 5\' 6"  (1.676 m)   Wt 156 lb (70.8 kg)   LMP  (LMP Unknown)   SpO2 99%   BMI 25.18 kg/m   Dyspnea on exertion Assessment & Plan: Somewhat new issue for patient, noted over the past 2 to 3 weeks.  Mostly noted with exertion, no reported symptoms at rest, no symptoms supine.  She has had possible slight dry cough recently.  Slight twinge under lower border of rib cage on the left side, no specific chest pain otherwise.  She does have follow-up upcoming with her cardiologist in about 6 weeks. On exam, patient is in no acute distress, vital signs stable.  Cardiovascular exam with regular rate and rhythm, lungs clear to auscultation bilaterally. Discussed considerations related to symptoms, do not feel that it is coming from known anemia as this number has generally been improving and exertional symptoms are more recent in onset.  Pulmonary cause also seems less likely given normal findings in office today.  I do feel would be appropriate for patient to try to arrange for sooner follow-up with her cardiology office to exclude this as a possible cause.  Patient will reach out to cardiologist to arrange for earlier appointment   Hematuria, unspecified type Assessment & Plan: Observed on recent urinalysis when patient was experiencing dysuria.  Suspect that this was related to diagnosed UTI.  She has not had any gross hematuria noted.  Dysuria has resolved with antibiotic therapy. Recommend recheck of urinalysis in about 3 to 4 weeks to monitor for resolution of hematuria which would be expected with treatment of UTI if this was underlying etiology  Orders: -     Urinalysis, Complete; Future  Return in about 3  months (around 12/17/2022) for hypertension, med check.   ___________________________________________ Alorah Mcree [name], MD, ABFM, [facility] Primary Care and Sports Medicine [facility]

## 2022-09-18 ENCOUNTER — Other Ambulatory Visit (HOSPITAL_BASED_OUTPATIENT_CLINIC_OR_DEPARTMENT_OTHER): Payer: Self-pay | Admitting: Cardiology

## 2022-09-18 ENCOUNTER — Other Ambulatory Visit (HOSPITAL_BASED_OUTPATIENT_CLINIC_OR_DEPARTMENT_OTHER): Payer: Self-pay

## 2022-09-18 ENCOUNTER — Other Ambulatory Visit: Payer: Self-pay | Admitting: Internal Medicine

## 2022-09-18 MED FILL — Verapamil HCl Tab ER 180 MG: ORAL | 90 days supply | Qty: 90 | Fill #0 | Status: AC

## 2022-09-20 ENCOUNTER — Other Ambulatory Visit (HOSPITAL_BASED_OUTPATIENT_CLINIC_OR_DEPARTMENT_OTHER): Payer: Self-pay

## 2022-09-25 ENCOUNTER — Other Ambulatory Visit (HOSPITAL_BASED_OUTPATIENT_CLINIC_OR_DEPARTMENT_OTHER): Payer: Self-pay

## 2022-09-30 ENCOUNTER — Other Ambulatory Visit (HOSPITAL_BASED_OUTPATIENT_CLINIC_OR_DEPARTMENT_OTHER): Payer: Self-pay

## 2022-09-30 ENCOUNTER — Other Ambulatory Visit (HOSPITAL_BASED_OUTPATIENT_CLINIC_OR_DEPARTMENT_OTHER): Payer: Self-pay | Admitting: Cardiology

## 2022-09-30 ENCOUNTER — Other Ambulatory Visit: Payer: Self-pay | Admitting: Family Medicine

## 2022-09-30 DIAGNOSIS — I471 Supraventricular tachycardia, unspecified: Secondary | ICD-10-CM

## 2022-09-30 DIAGNOSIS — Z1231 Encounter for screening mammogram for malignant neoplasm of breast: Secondary | ICD-10-CM

## 2022-09-30 DIAGNOSIS — I4719 Other supraventricular tachycardia: Secondary | ICD-10-CM

## 2022-09-30 MED ORDER — VERAPAMIL HCL ER 180 MG PO TBCR
180.0000 mg | EXTENDED_RELEASE_TABLET | Freq: Every day | ORAL | 2 refills | Status: AC
Start: 2022-09-30 — End: ?
  Filled 2022-09-30 – 2022-12-17 (×2): qty 90, 90d supply, fill #0
  Filled 2023-03-12: qty 90, 90d supply, fill #1
  Filled 2023-06-08: qty 90, 90d supply, fill #2

## 2022-09-30 MED FILL — Rosuvastatin Calcium Tab 5 MG: ORAL | 90 days supply | Qty: 90 | Fill #0 | Status: CN

## 2022-09-30 MED FILL — Losartan Potassium Tab 25 MG: ORAL | 90 days supply | Qty: 90 | Fill #0 | Status: CN

## 2022-10-01 ENCOUNTER — Encounter (HOSPITAL_BASED_OUTPATIENT_CLINIC_OR_DEPARTMENT_OTHER): Payer: Self-pay | Admitting: Cardiology

## 2022-10-01 ENCOUNTER — Ambulatory Visit (HOSPITAL_BASED_OUTPATIENT_CLINIC_OR_DEPARTMENT_OTHER): Payer: Medicare Other | Admitting: Cardiology

## 2022-10-01 ENCOUNTER — Other Ambulatory Visit (HOSPITAL_BASED_OUTPATIENT_CLINIC_OR_DEPARTMENT_OTHER): Payer: Self-pay

## 2022-10-01 VITALS — BP 122/64 | HR 94 | Ht 66.0 in | Wt 161.0 lb

## 2022-10-01 DIAGNOSIS — K922 Gastrointestinal hemorrhage, unspecified: Secondary | ICD-10-CM

## 2022-10-01 DIAGNOSIS — I77819 Aortic ectasia, unspecified site: Secondary | ICD-10-CM | POA: Diagnosis not present

## 2022-10-01 DIAGNOSIS — I422 Other hypertrophic cardiomyopathy: Secondary | ICD-10-CM | POA: Diagnosis not present

## 2022-10-01 DIAGNOSIS — I2510 Atherosclerotic heart disease of native coronary artery without angina pectoris: Secondary | ICD-10-CM

## 2022-10-01 NOTE — Progress Notes (Signed)
Cardiology Office Note:  .   Date:  10/01/2022  ID:  Sara [name], DOB [birth year], MRN [id number] PCP: [name], Raymond J, MD  [facility] Providers Cardiologist:  Yvonne Kendall, MD Electrophysiologist:  Sherryl Manges, MD    History of Present Illness: .   Sara [name] is a 77 y.o. female with recent PCI to proximal/mid RCA on 04/29/2022.  PET scan prior showed ischemia.  Previous 64-month history of shortness of breath.  This slowly improved post catheterization.  2 weeks right after the stents she did feel better she states less short of breath.  Then she had a GI bleed with decreased hemoglobin.  This was manifested also by left under rib cage discomfort.  Esophageal ulceration noted.  See below for details.  Easy bruising noted.  Postponed hip surgery   ROS: No further bleeding.  Studies Reviewed: .        Cardiac MRI in 2023-asymmetric LVH but not criteria for hypertrophic cardiomyopathy.  ZIO monitor 2022-SVT-verapamil.  Risk Assessment/Calculations:            Physical Exam:   VS:  BP 122/64   Pulse 94   Ht 5\' 6"  (1.676 m)   Wt 161 lb (73 kg)   LMP  (LMP Unknown)   BMI 25.99 kg/m    Wt Readings from Last 3 Encounters:  10/01/22 161 lb (73 kg)  09/16/22 156 lb (70.8 kg)  08/28/22 158 lb (71.7 kg)    GEN: Well nourished, well developed in no acute distress NECK: No JVD; No carotid bruits CARDIAC: RRR, no murmurs, rubs, gallops RESPIRATORY:  Clear to auscultation without rales, wheezing or rhonchi  ABDOMEN: Soft, non-tender, non-distended EXTREMITIES:  No edema; No deformity bruises noted  ASSESSMENT AND PLAN: .   Coronary artery disease PCI to proximal/mid RCA 04/2022 -Dyspnea on exertion symptoms of angina.   -I will go ahead and stop her aspirin 81 mg.  This is salicylic acid, she has had an esophageal ulceration.  There is some theory that Plavix monotherapy may be safer in this situation.  We will continue with Plavix monotherapy  lifelong.  Obviously if she does have a rebleed, we can always switch her over to aspirin 81 mg monotherapy to see if this works better.  I think since she is out about 5 months since stent placement it is reasonable to discontinue her aspirin. -Continue to encourage exercise.  Her hemoglobin is 11.9.  Improving.  She is taking iron supplementation. -Wedge was 11, LVEDP was 13 mmHg during cath.  Upper GI bleed April 2021 - April 2024-bloody emesis, red Jell-O.  EGD showed esophageal ulcer, 5 cm hiatal hernia, Barrett's, normal stomach normal duodenum.  Plavix was resumed on 07/13/2022.  PPI.  Hemoglobin as low as 6.9 on discharge 9.4. 1 unit of blood given.  Dr. Chales Abrahams with GI.  Came back to ER on 07/16/2022 concern about abdominal cramping.  Discharge.  Dr. Chales Abrahams would like to do EGD colonoscopy in September 2024. -As above I am stopping her aspirin 81 mg.  We will proceed with Plavix monotherapy. - 6 months post stent placement, I would be comfortable with her holding her Plavix for 5 days prior to EGD colonoscopy.  Resume thereafter.  Hyperlipidemia - Currently on Crestor 5 mg a day.  Most recent LDL 47.  SVT/atrial tachycardia/palpitations - Verapamil 180 mg a day.  Stable.  Asymmetric septal hypertrophy/aortic dilatation - Stable.  Does not meet criteria for hypertrophic cardiomyopathy from cardiac MRI.  Primary hypertension - Previously losartan 25 mg was added.  Monitoring her blood pressures.  Doing better.  Excellent blood pressure today.  Creatinine 1.5.  Chronic kidney disease stage IIIa - Creatinine 1.5 range.       Dispo: 6 months with Tessa  47 minutes used with review of medical records discussion with patient observation of studies.  Signed, Donato Schultz, MD

## 2022-10-01 NOTE — Patient Instructions (Signed)
Medication Instructions:  Please discontinue your Aspirin. Continue all other medications as listed.  *If you need a refill on your cardiac medications before your next appointment, please call your pharmacy*  Follow-Up: At [facility], you and your health needs are our priority.  As part of our continuing mission to provide you with exceptional heart care, we have created designated Provider Care Teams.  These Care Teams include your primary Cardiologist (physician) and Advanced Practice Providers (APPs -  Physician Assistants and Nurse Practitioners) who all work together to provide you with the care you need, when you need it.  We recommend signing up for the patient portal called "MyChart".  Sign up information is provided on this After Visit Summary.  MyChart is used to connect with patients for Virtual Visits (Telemedicine).  Patients are able to view lab/test results, encounter notes, upcoming appointments, etc.  Non-urgent messages can be sent to your provider as well.   To learn more about what you can do with MyChart, go to [email or web address].    Your next appointment:   6 month(s)  Provider:   Jari Favre, PA-C

## 2022-10-05 ENCOUNTER — Other Ambulatory Visit (HOSPITAL_BASED_OUTPATIENT_CLINIC_OR_DEPARTMENT_OTHER): Payer: Self-pay

## 2022-10-14 ENCOUNTER — Other Ambulatory Visit (HOSPITAL_BASED_OUTPATIENT_CLINIC_OR_DEPARTMENT_OTHER): Payer: Medicare Other

## 2022-10-14 DIAGNOSIS — R319 Hematuria, unspecified: Secondary | ICD-10-CM | POA: Diagnosis not present

## 2022-10-15 LAB — URINALYSIS, COMPLETE
Bilirubin, UA: NEGATIVE
Glucose, UA: NEGATIVE
Ketones, UA: NEGATIVE
Leukocytes,UA: NEGATIVE
Nitrite, UA: NEGATIVE
Protein,UA: NEGATIVE
RBC, UA: NEGATIVE
Specific Gravity, UA: 1.009 (ref 1.005–1.030)
Urobilinogen, Ur: 0.2 mg/dL (ref 0.2–1.0)
pH, UA: 5.5 (ref 5.0–7.5)

## 2022-10-15 LAB — MICROSCOPIC EXAMINATION
Bacteria, UA: NONE SEEN
Casts: NONE SEEN /lpf
WBC, UA: NONE SEEN /hpf (ref 0–5)

## 2022-10-24 ENCOUNTER — Ambulatory Visit: Payer: Medicare Other | Admitting: Cardiology

## 2022-11-01 ENCOUNTER — Other Ambulatory Visit (HOSPITAL_BASED_OUTPATIENT_CLINIC_OR_DEPARTMENT_OTHER): Payer: Self-pay

## 2022-11-01 MED FILL — Rosuvastatin Calcium Tab 5 MG: ORAL | 90 days supply | Qty: 90 | Fill #0 | Status: AC

## 2022-11-04 ENCOUNTER — Encounter: Payer: Self-pay | Admitting: Gastroenterology

## 2022-11-04 ENCOUNTER — Ambulatory Visit (INDEPENDENT_AMBULATORY_CARE_PROVIDER_SITE_OTHER): Payer: Medicare Other | Admitting: Family Medicine

## 2022-11-04 ENCOUNTER — Telehealth: Payer: Self-pay

## 2022-11-04 ENCOUNTER — Ambulatory Visit: Payer: Medicare Other | Admitting: Gastroenterology

## 2022-11-04 ENCOUNTER — Encounter (HOSPITAL_BASED_OUTPATIENT_CLINIC_OR_DEPARTMENT_OTHER): Payer: Self-pay | Admitting: Family Medicine

## 2022-11-04 ENCOUNTER — Other Ambulatory Visit (HOSPITAL_BASED_OUTPATIENT_CLINIC_OR_DEPARTMENT_OTHER): Payer: Self-pay

## 2022-11-04 ENCOUNTER — Other Ambulatory Visit (INDEPENDENT_AMBULATORY_CARE_PROVIDER_SITE_OTHER): Payer: Medicare Other

## 2022-11-04 VITALS — BP 124/70 | HR 93 | Ht 66.0 in | Wt 160.0 lb

## 2022-11-04 DIAGNOSIS — K2270 Barrett's esophagus without dysplasia: Secondary | ICD-10-CM

## 2022-11-04 DIAGNOSIS — N179 Acute kidney failure, unspecified: Secondary | ICD-10-CM | POA: Diagnosis not present

## 2022-11-04 DIAGNOSIS — R300 Dysuria: Secondary | ICD-10-CM | POA: Diagnosis not present

## 2022-11-04 DIAGNOSIS — Z860100 Personal history of colon polyps, unspecified: Secondary | ICD-10-CM

## 2022-11-04 DIAGNOSIS — I2510 Atherosclerotic heart disease of native coronary artery without angina pectoris: Secondary | ICD-10-CM | POA: Diagnosis not present

## 2022-11-04 DIAGNOSIS — K922 Gastrointestinal hemorrhage, unspecified: Secondary | ICD-10-CM

## 2022-11-04 DIAGNOSIS — K449 Diaphragmatic hernia without obstruction or gangrene: Secondary | ICD-10-CM | POA: Diagnosis not present

## 2022-11-04 DIAGNOSIS — N390 Urinary tract infection, site not specified: Secondary | ICD-10-CM

## 2022-11-04 DIAGNOSIS — Z9861 Coronary angioplasty status: Secondary | ICD-10-CM

## 2022-11-04 DIAGNOSIS — K219 Gastro-esophageal reflux disease without esophagitis: Secondary | ICD-10-CM

## 2022-11-04 LAB — POCT URINALYSIS DIPSTICK
Bilirubin, UA: NEGATIVE
Glucose, UA: NEGATIVE
Ketones, UA: NEGATIVE
Nitrite, UA: NEGATIVE
Protein, UA: NEGATIVE
Spec Grav, UA: 1.015 (ref 1.010–1.025)
Urobilinogen, UA: 0.2 E.U./dL
pH, UA: 5.5 (ref 5.0–8.0)

## 2022-11-04 LAB — COMPREHENSIVE METABOLIC PANEL
ALT: 11 U/L (ref 0–35)
AST: 16 U/L (ref 0–37)
Albumin: 4 g/dL (ref 3.5–5.2)
Alkaline Phosphatase: 73 U/L (ref 39–117)
BUN: 22 mg/dL (ref 6–23)
CO2: 25 mEq/L (ref 19–32)
Calcium: 9.7 mg/dL (ref 8.4–10.5)
Chloride: 104 mEq/L (ref 96–112)
Creatinine, Ser: 1.88 mg/dL — ABNORMAL HIGH (ref 0.40–1.20)
GFR: 25.88 mL/min — ABNORMAL LOW (ref >60.00)
Glucose, Bld: 104 mg/dL — ABNORMAL HIGH (ref 70–99)
Potassium: 4.3 mEq/L (ref 3.5–5.1)
Sodium: 139 mEq/L (ref 135–145)
Total Bilirubin: 0.7 mg/dL (ref 0.2–1.2)
Total Protein: 6.7 g/dL (ref 6.0–8.3)

## 2022-11-04 LAB — CBC WITH DIFFERENTIAL/PLATELET
Basophils Absolute: 0 10*3/uL (ref 0.0–0.1)
Basophils Relative: 0.5 % (ref 0.0–3.0)
Eosinophils Absolute: 0.3 10*3/uL (ref 0.0–0.7)
Eosinophils Relative: 4.8 % (ref 0.0–5.0)
HCT: 36.8 % (ref 36.0–46.0)
Hemoglobin: 12.1 g/dL (ref 12.0–15.0)
Lymphocytes Relative: 26.1 % (ref 12.0–46.0)
Lymphs Abs: 1.8 10*3/uL (ref 0.7–4.0)
MCHC: 32.9 g/dL (ref 30.0–36.0)
MCV: 89.7 fl (ref 78.0–100.0)
Monocytes Absolute: 0.7 10*3/uL (ref 0.1–1.0)
Monocytes Relative: 9.9 % (ref 3.0–12.0)
Neutro Abs: 4.1 10*3/uL (ref 1.4–7.7)
Neutrophils Relative %: 58.7 % (ref 43.0–77.0)
Platelets: 263 10*3/uL (ref 150.0–400.0)
RBC: 4.1 Mil/uL (ref 3.87–5.11)
RDW: 16 % — ABNORMAL HIGH (ref 11.5–15.5)
WBC: 7 10*3/uL (ref 4.0–10.5)

## 2022-11-04 MED ORDER — SULFAMETHOXAZOLE-TRIMETHOPRIM 400-80 MG PO TABS
1.0000 | ORAL_TABLET | Freq: Every day | ORAL | 0 refills | Status: AC
  Filled 2022-11-04: qty 7, 7d supply, fill #0

## 2022-11-04 NOTE — Assessment & Plan Note (Signed)
Patient reports that recently she began to notice some back discomfort, primarily along right side.  She indicates that in the past she has felt similar symptoms related to UTI.  She has not had any pain or burning with urination.  Has not noticed any change in her urine appearance, color, odor.  She has not had any abdominal pain, nausea, vomiting, fever or chills.  She was seen at her GI office and had labs completed and did note that her creatinine was higher than usual.  She has had recent urinary symptoms over the past few months, most recently urine culture grew Citrobacter freundii.  This was treated with Bactrim, patient tolerated medication well, reports that symptoms did resolve at that time. On exam, patient is in no acute distress, patient is afebrile. We did complete urine dipstick in office today which showed evidence of leukocyte esterase, negative nitrite, trace blood. Reviewed findings with patient, given atypical symptoms, borderline urine testing, can proceed with urine culture.  We will also proceed with empiric treatment at this time based upon prior urine culture.  Given that GFR was decreased on labs today, we will proceed with adjusting dosing of Bactrim. We will also proceed with referral to urogynecology given recurrent issue that patient has been having

## 2022-11-04 NOTE — Progress Notes (Signed)
Chief Complaint: FU  Referring Provider:  [name], Raymond J, MD      ASSESSMENT AND PLAN;   #1. UGI bleed d/t 10mm mid esophageal ulcer on EGD 07/13/2022  #2. Large 5 cm HH (seen Dr Daphine Deutscher) with Barrett's esophagus.  No masses or nodularity and Barrett's esophagus.  Failed fundoplication 2011  #3. CAD s/p recent DES 04/29/2022 (on plavix, off ASA)  Plan: -Continue protonix 40 BID -CBC, CMP today -Continue Iron 1 tab po qd (may turn stool dark) -Miralax 17g po QD (for constipation) -EGD/colon off Plavix x 5 days (already scheduled). Needs cardio clearence   I discussed EGD/Colonoscopy- the indications, risks, alternatives and potential complications including, but not limited to bleeding, infection, reaction to meds, damage to internal organs, cardiac and/or pulmonary problems, and perforation requiring surgery. The possibility that significant findings could be missed was explained. All ? were answered. Pt consents to proceed.  HPI:    Sara [name] is a 77 y.o. female  Patient of Dr. Christella Hartigan With hypertension, hyperlipidemia, CKD stage III, CAD status post DES stent to mid RCA on 04/29/22 on (bASA/clopidogrel), hiatal hernia status post failed fundoplication 2011, gastritis, esophagitis, Barrett's esophagus  FU Much better except for mild dizziness Hb 11.7, MCV 91  Recent UTI Has CKD3 Cr 1.43, GFR 35 ml/min 08/2022  Seen by cardiology.  Taken off baby aspirin but need to continue Plavix.  She is scheduled for EGD/colonoscopy  Adm with hematemesis. Hb 14 (05/2022) to 6.9 s/p 1U to 8.4.  EGD 07/13/2022 identified a 5 cm hiatal hernia, and esophageal ulcer without stigmata of recent bleeding and evidence of Barrett's esophagus. Stomach and duodenum were normal. On PPI bid. Plavix restarted post EGD.  Started iron 65mg  po QD Got constipated- taking miralax Hb 9.4. Stopped Carafate.  Does feel somewhat better.  Stool has cleared.  No nausea, vomiting, hematemesis,  dysphagia or odynophagia.  No fever or chills.  Has history of chronic constipation for which she uses MiraLAX.  We have gone over recent EGD, CT Abdo/pelvis, labs etc.  She is under close cardiology follow-up as well.  It has been recommended to continue baby aspirin/Plavix for now.  She has seen Dr. Daphine Deutscher for surgical repair of hiatal hernia last year-recommended conservative management.     Latest Ref Rng & Units 09/03/2022    8:03 AM 07/25/2022    9:10 AM 07/23/2022    9:07 AM  CBC  WBC 4.0 - 10.5 K/uL 6.7  7.0  7.0   Hemoglobin 12.0 - 15.0 g/dL 10.9  9.8  32.3   Hematocrit 36.0 - 46.0 % 35.8  28.5  30.7   Platelets 150.0 - 400.0 K/uL 302.0  318.0  342.0      Past GI workup: EGD by Dr. Elnoria Howard 07/13/2022: - Esophageal ulcer with no stigmata of recent bleeding.  - 5 cm hiatal hernia.  - Gastroesophageal flap valve classified as Hill Grade IV (no fold, wide open lumen, hiatal hernia present).  - Esophageal mucosal changes classified as Barrett's stage C13-M15 per Prague criteria.  - Normal stomach.  - Normal examined duodenum.  - No specimens collected.  CTA 07/12/2022 chest Large hiatal hernia. There is a dilated fluid-filled esophagus. Submucosal hyperenhancement as well along the lower esophagus. Further workup when clinically appropriate. Significant breathing motion significantly limiting evaluation for pulmonary emboli. No large or central embolus.  UGI series 07/2021 1. Small to moderate recurrent hiatal hernia. 2. Severe spontaneous gastroesophageal reflux, noting undigested food debris throughout  the refluxed material in the esophagus. 3. Moderate esophageal dysmotility, compatible with chronic reflux related dysmotility. 4. Small periampullary duodenal diverticulum. Otherwise normal upper GI. No masses or strictures.  01/2018 colonoscopy Dr. Christella Hartigan 15 mm polyp cecum 3 mm polyp descending colon diverticula  12/10/2019 endoscopy for indigestion, remote  fundoplication for large HH, Barrett's sowed significantly anatomical abnormal proximal stomach metaplasia is intact however the diaphragm is a medium size hiatal hernia free reflux of gastric secretion in the esophagus no retained solid or liquid food in the stomach nonspecific mild distal gastritis esophagus torturous GE junction single erosion, Barrett's appearing mucosa up to 25 cm mild reflux related esophagitis Pathology showed Barrett's changes without dysplasia recall 3 years.    Past Medical History:  Diagnosis Date   Anemia 08/13/2022   Anxiety 07/2020   Aortic dilatation (HCC) 03/25/2022   38 mm noted on echo   Arthritis 11/2017   Barrett's esophagus    CAD (coronary artery disease)    Cataract    had surgery   Chronic kidney disease, stage 3a (HCC)    GERD (gastroesophageal reflux disease)    hx of    HIATAL HERNIA    s/p surgical repair   Hyperlipidemia    HYPERTENSION    Kidney stone    1990s   Known medical problems 07/12/2022   LVH (left ventricular hypertrophy)    Osteoporosis    PAT (paroxysmal atrial tachycardia)    Premature atrial contractions     Past Surgical History:  Procedure Laterality Date   ABDOMINAL HYSTERECTOMY  06/12/2012   CATARACT EXTRACTION  2009   both eyes   CHOLECYSTECTOMY  11/02/2012   Procedure: CHOLECYSTECTOMY;  Surgeon: Valarie Merino, MD;  Location: [facility];  Service: General;;   COLOSTOMY     CORONARY STENT INTERVENTION N/A 04/29/2022   Procedure: CORONARY STENT INTERVENTION;  Surgeon: Yvonne Kendall, MD;  Location: [facility];  Service: Cardiovascular;  Laterality: N/A;   CORONARY ULTRASOUND/IVUS N/A 04/29/2022   Procedure: Intravascular Ultrasound/IVUS;  Surgeon: Yvonne Kendall, MD;  Location: [facility] CV [facility];  Service: Cardiovascular;  Laterality: N/A;   CYSTOCELE REPAIR N/A 06/12/2012   Procedure: ANTERIOR REPAIR (CYSTOCELE);  Surgeon: Robley Fries, MD;  Location: [facility];  Service: Gynecology;  Laterality: N/A;    ESOPHAGOGASTRODUODENOSCOPY (EGD) WITH PROPOFOL N/A 07/13/2022   Procedure: ESOPHAGOGASTRODUODENOSCOPY (EGD) WITH PROPOFOL;  Surgeon: Jeani Hawking, MD;  Location: [facility];  Service: Gastroenterology;  Laterality: N/A;   EYE SURGERY  Cataract removal 2009   HERNIA REPAIR  2011   HIATAL HERNIA REPAIR  2011   LITHOTRIPSY     RIGHT/LEFT HEART CATH AND CORONARY ANGIOGRAPHY N/A 04/29/2022   Procedure: RIGHT/LEFT HEART CATH AND CORONARY ANGIOGRAPHY;  Surgeon: Yvonne Kendall, MD;  Location: [facility];  Service: Cardiovascular;  Laterality: N/A;   TONSILLECTOMY  1968   UPPER GASTROINTESTINAL ENDOSCOPY     UPPER GI ENDOSCOPY  11/02/2012   Procedure: UPPER GI ENDOSCOPY;  Surgeon: Valarie Merino, MD;  Location: [facility];  Service: General;;   VAGINAL HYSTERECTOMY N/A 06/12/2012   Procedure: HYSTERECTOMY VAGINAL;  Surgeon: Robley Fries, MD;  Location: [facility];  Service: Gynecology;  Laterality: N/A;    Family History  Problem Relation Age of Onset   Breast cancer Mother 36       with mets    Cancer Mother    Hypertension Mother    Early death Father    Heart disease Father    Kidney  disease Father    Hypertension Other        Parent   Breast cancer Other    Heart disease Other        parent, grandparent   Colon cancer Paternal Aunt    Breast cancer Maternal Grandmother    Asthma Maternal Grandmother    Cancer Maternal Grandmother    Cancer Paternal Uncle    Early death Paternal Uncle    Cancer Paternal Aunt    Esophageal cancer Neg Hx    Rectal cancer Neg Hx    Stomach cancer Neg Hx    Colon polyps Neg Hx     Social History   Tobacco Use   Smoking status: Never    Passive exposure: Past   Smokeless tobacco: Never  Vaping Use   Vaping status: Never Used  Substance Use Topics   Alcohol use: Never   Drug use: Never    Current Outpatient Medications  Medication Sig Dispense Refill   acetaminophen (TYLENOL) 325 MG tablet Take 2 tablets (650 mg total) by mouth  every 6 (six) hours as needed for mild pain (or Fever >/= 101). 100 tablet 0   clopidogrel (PLAVIX) 75 MG tablet Take 1 tablet (75 mg total) by mouth daily. 30 tablet 11   cyanocobalamin (VITAMIN B12) 1000 MCG tablet Take 1,000 mcg by mouth daily.     Ferrous Sulfate Dried (HIGH POTENCY IRON) 65 MG TABS      furosemide (LASIX) 20 MG tablet Take 1 tablet by mouth daily for 3 days, then take as needed for fluid (Patient taking differently: Take 20 mg by mouth daily as needed for edema.) 30 tablet 3   losartan (COZAAR) 25 MG tablet Take 1 tablet (25 mg total) by mouth daily. 90 tablet 3   methocarbamol (ROBAXIN) 500 MG tablet Take 1-2 tablets (500-1,000 mg total) by mouth every 6 to 8 hours as needed for muscle spasms/tension. 60 tablet 2   Multiple Vitamin (MULTIVITAMIN WITH MINERALS) TABS Take 1 tablet by mouth daily.     ondansetron (ZOFRAN) 4 MG tablet Take 1 tablet (4 mg total) by mouth every 6 (six) hours as needed for nausea. 20 tablet 0   pantoprazole (PROTONIX) 40 MG tablet Take 1 tablet (40 mg total) by mouth 2 (two) times daily. 180 tablet 3   polyethylene glycol powder (GLYCOLAX/MIRALAX) 17 GM/SCOOP powder Take 17 g by mouth daily as needed for mild constipation. 238 g 0   Probiotic Product (PROBIOTIC DAILY PO) Take 1 capsule by mouth daily.     rosuvastatin (CRESTOR) 5 MG tablet Take 1 tablet (5 mg total) by mouth daily. 90 tablet 3   verapamil (CALAN-SR) 180 MG CR tablet Take 1 tablet (180 mg total) by mouth daily. 90 tablet 2   nitroGLYCERIN (NITROSTAT) 0.4 MG SL tablet Place 1 tablet (0.4 mg total) under the tongue every 5 (five) minutes as needed for chest pain. 25 tablet 3   No current facility-administered medications for this visit.    Allergies  Allergen Reactions   Augmentin [Amoxicillin-Pot Clavulanate] Diarrhea   Metoprolol     Had prior to cor CT, felt very dizzy   Nsaids     Avoid due to diminished kidney function    Review of Systems:  neg     Physical Exam:     BP 124/70   Pulse 93   Ht 5\' 6"  (1.676 m)   Wt 160 lb (72.6 kg)   LMP  (LMP Unknown)   BMI  25.82 kg/m  Wt Readings from Last 3 Encounters:  11/04/22 160 lb (72.6 kg)  10/01/22 161 lb (73 kg)  09/16/22 156 lb (70.8 kg)   Constitutional:  Well-developed, in no acute distress. Psychiatric: Normal mood and affect. Behavior is normal. Cardiovascular: Normal rate, regular rhythm. No edema Pulmonary/chest: Effort normal and breath sounds normal. No wheezing, rales or rhonchi. Abdominal: Soft, nondistended. Nontender. Bowel sounds active throughout. There are no masses palpable. No hepatomegaly. Rectal: Deferred Neurological: Alert and oriented to person place and time. Skin: Skin is warm and dry. No rashes noted.  Data Reviewed: I have personally reviewed following labs and imaging studies  CBC:    Latest Ref Rng & Units 09/03/2022    8:03 AM 07/25/2022    9:10 AM 07/23/2022    9:07 AM  CBC  WBC 4.0 - 10.5 K/uL 6.7  7.0  7.0   Hemoglobin 12.0 - 15.0 g/dL 62.9  9.8  52.8   Hematocrit 36.0 - 46.0 % 35.8  28.5  30.7   Platelets 150.0 - 400.0 K/uL 302.0  318.0  342.0     CMP:    Latest Ref Rng & Units 09/03/2022    8:03 AM 08/13/2022    9:15 AM 07/23/2022    9:07 AM  CMP  Glucose 70 - 99 mg/dL 92  97  97   BUN 6 - 23 mg/dL 19  20  16    Creatinine 0.40 - 1.20 mg/dL 4.13  2.44  0.10   Sodium 135 - 145 mEq/L 137  139  137   Potassium 3.5 - 5.1 mEq/L 4.4  4.5  4.5   Chloride 96 - 112 mEq/L 106  102  100   CO2 19 - 32 mEq/L 23  23  27    Calcium 8.4 - 10.5 mg/dL 9.2  9.4  9.5   Total Protein 6.0 - 8.3 g/dL 6.4   6.3   Total Bilirubin 0.2 - 1.2 mg/dL 0.3   0.4   Alkaline Phos 39 - 117 U/L 71   66   AST 0 - 37 U/L 17   13   ALT 0 - 35 U/L 11   9        Radiology Studies: No results found.    Edman Circle, MD 11/04/2022, 9:02 AM  Cc: [name], Raymond J, MD

## 2022-11-04 NOTE — Assessment & Plan Note (Signed)
Uncertain etiology, possibly related to underlying UTI.  Will proceed with treatment as below, plan for urine culture. We will proceed with repeat BMP in 1 to 2 weeks to monitor response.  May consider further evaluation with nephrology should elevation persist.

## 2022-11-04 NOTE — Telephone Encounter (Signed)
Patient made aware that she can hold her Plavix 5 days prior. She voiced understanding

## 2022-11-04 NOTE — Telephone Encounter (Signed)
[facility] Pre-operative Risk Assessment     Request for surgical clearance:     Endoscopy Procedure  What type of surgery is being performed?     EGD/Colon  When is this surgery scheduled?     11-29-2022  What type of clearance is required ?   Pharmacy  Are there any medications that need to be held prior to surgery and how long? Plavix 5 day hold  Practice name and name of physician performing surgery?      [facility]  What is your office phone and fax number?      Phone- [phone]  Fax- [phone]  Anesthesia type (None, local, MAC, general) ?       MAC

## 2022-11-04 NOTE — Progress Notes (Signed)
    Procedures performed today:    None.  Independent interpretation of notes and tests performed by another provider:   None.  Brief History, Exam, Impression, and Recommendations:    BP 130/68   Pulse 71   Ht 5\' 6"  (1.676 m)   Wt 163 lb (73.9 kg)   LMP  (LMP Unknown)   SpO2 100%   BMI 26.31 kg/m   Dysuria -     POCT urinalysis dipstick -     Urine Culture  Recurrent UTI Assessment & Plan: Patient reports that recently she began to notice some back discomfort, primarily along right side.  She indicates that in the past she has felt similar symptoms related to UTI.  She has not had any pain or burning with urination.  Has not noticed any change in her urine appearance, color, odor.  She has not had any abdominal pain, nausea, vomiting, fever or chills.  She was seen at her GI office and had labs completed and did note that her creatinine was higher than usual.  She has had recent urinary symptoms over the past few months, most recently urine culture grew Citrobacter freundii.  This was treated with Bactrim, patient tolerated medication well, reports that symptoms did resolve at that time. On exam, patient is in no acute distress, patient is afebrile. We did complete urine dipstick in office today which showed evidence of leukocyte esterase, negative nitrite, trace blood. Reviewed findings with patient, given atypical symptoms, borderline urine testing, can proceed with urine culture.  We will also proceed with empiric treatment at this time based upon prior urine culture.  Given that GFR was decreased on labs today, we will proceed with adjusting dosing of Bactrim. We will also proceed with referral to urogynecology given recurrent issue that patient has been having  Orders: -     Ambulatory referral to Urogynecology  AKI (acute kidney injury) [facility]) Assessment & Plan: Uncertain etiology, possibly related to underlying UTI.  Will proceed with treatment as below, plan for urine  culture. We will proceed with repeat BMP in 1 to 2 weeks to monitor response.  May consider further evaluation with nephrology should elevation persist.  Orders: -     Basic metabolic panel  Other orders -     Sulfamethoxazole-Trimethoprim; Take 1 tablet by mouth daily for 7 days.  Dispense: 7 tablet; Refill: 0  No follow-ups on file.   ___________________________________________  [name], MD, ABFM, CAQSM Primary Care and Sports Medicine [facility]

## 2022-11-04 NOTE — Telephone Encounter (Signed)
   Patient Name: Sara [name]  DOB: [birth year] MRN: [id number]  Primary Cardiologist: Donato Schultz, MD  Chart reviewed as part of pre-operative protocol coverage. Pre-op clearance already addressed by colleagues in earlier phone notes. To summarize recommendations:  -6 months post stent placement, I would be comfortable with her holding her Plavix for 5 days prior to EGD colonoscopy. Resume thereafter.  -Dr. Anne Fu 10/01/22  Will route this bundled recommendation to requesting provider via Epic fax function and remove from pre-op pool. Please call with questions.  Sharlene Dory, PA-C 11/04/2022, 10:21 AM

## 2022-11-04 NOTE — Patient Instructions (Addendum)
_______________________________________________________  If your blood pressure at your visit was 140/90 or greater, please contact your primary care physician to follow up on this.  _______________________________________________________  If you are age 75 or older, your body mass index should be between 23-30. Your Body mass index is 25.82 kg/m. If this is out of the aforementioned range listed, please consider follow up with your Primary Care Provider.  If you are age 20 or younger, your body mass index should be between 19-25. Your Body mass index is 25.82 kg/m. If this is out of the aformentioned range listed, please consider follow up with your Primary Care Provider.   ________________________________________________________  The [facility] providers would like to encourage you to use [facility] to communicate with providers for non-urgent requests or questions.  Due to long hold times on the telephone, sending your provider a message by [facility] may be a faster and more efficient way to get a response.  Please allow 48 business hours for a response.  Please remember that this is for non-urgent requests.  _______________________________________________________   Your provider has requested that you go to the basement level for lab work before leaving today. Press "B" on the elevator. The lab is located at the first door on the left as you exit the elevator.  Continue current medications  Stop Iron tablet 1 week prior to the procedure  You will be contacted by our office prior to your procedure for directions on holding your Plavix.  If you do not hear from our office 2 week prior to your scheduled procedure, please call [phone] to discuss.   You have been scheduled for an endoscopy and colonoscopy. Please follow the written instructions given to you at your visit today.  Please pick up your prep supplies at the pharmacy within the next 1-3 days.  If you use inhalers (even only as  needed), please bring them with you on the day of your procedure.  DO NOT TAKE 7 DAYS PRIOR TO TEST- Trulicity (dulaglutide) Ozempic, Wegovy (semaglutide) Mounjaro (tirzepatide) Bydureon Bcise (exanatide extended release)  DO NOT TAKE 1 DAY PRIOR TO YOUR TEST Rybelsus (semaglutide) Adlyxin (lixisenatide) Victoza (liraglutide) Byetta (exanatide) ___________________________________________________________________________  Thank you,  Dr. Lynann Bologna

## 2022-11-08 ENCOUNTER — Other Ambulatory Visit (HOSPITAL_BASED_OUTPATIENT_CLINIC_OR_DEPARTMENT_OTHER): Payer: Self-pay

## 2022-11-08 ENCOUNTER — Encounter (HOSPITAL_BASED_OUTPATIENT_CLINIC_OR_DEPARTMENT_OTHER): Payer: Self-pay | Admitting: Family Medicine

## 2022-11-08 ENCOUNTER — Telehealth (INDEPENDENT_AMBULATORY_CARE_PROVIDER_SITE_OTHER): Payer: Medicare Other | Admitting: Family Medicine

## 2022-11-08 VITALS — Temp 98.9°F | Ht 66.0 in | Wt 160.2 lb

## 2022-11-08 DIAGNOSIS — U071 COVID-19: Secondary | ICD-10-CM | POA: Diagnosis not present

## 2022-11-08 MED ORDER — MOLNUPIRAVIR EUA 200MG CAPSULE
4.0000 | ORAL_CAPSULE | Freq: Two times a day (BID) | ORAL | 0 refills | Status: AC
Start: 2022-11-08 — End: 2022-11-13
  Filled 2022-11-08: qty 40, 5d supply, fill #0

## 2022-11-08 NOTE — Progress Notes (Signed)
Virtual Visit via Video Note  I connected with Burman Blacksmith on 11/08/22 at 10:54 AM by a video enabled telemedicine application and verified that I am speaking with the correct person using two identifiers.  Patient Location: Home Provider Location: Office/Clinic  I discussed the limitations, risks, security, and privacy concerns of performing an evaluation and management service by video and the availability of in person appointments. I also discussed with the patient that there may be a patient responsible charge related to this service. The patient expressed understanding and agreed to proceed.  Subjective: PCP: [name], Raymond J, MD  Chief Complaint  Patient presents with   Covid Positive    Spoke with pt who states she tested positive for covid today 8/16. States she started having symptoms one day ago having bronchitis symptoms, nasal drainage. Now has been having headaches. Temp today when she woke up was 100.5 and has been taking tylenol to help. Pt has had covid prior and states she was placed on molnupiravir.   Yesterday she woke up and had rhinorrhea with a non-productive cough. She thought she started to have bronchitis. Then she noticed she started to have fever/chills.  Fever of 100.53F this morning. Took COVID test this morning and was +  Headache- improvement with acetaminophen. Unable to take NSAIDs.  Denies shob, dyspnea, chest pain, dizziness.   ROS: Per HPI  Current Outpatient Medications:    acetaminophen (TYLENOL) 325 MG tablet, Take 2 tablets (650 mg total) by mouth every 6 (six) hours as needed for mild pain (or Fever >/= 101)., Disp: 100 tablet, Rfl: 0   clopidogrel (PLAVIX) 75 MG tablet, Take 1 tablet (75 mg total) by mouth daily., Disp: 30 tablet, Rfl: 11   cyanocobalamin (VITAMIN B12) 1000 MCG tablet, Take 1,000 mcg by mouth daily., Disp: , Rfl:    Ferrous Sulfate Dried (HIGH POTENCY IRON) 65 MG TABS, , Disp: , Rfl:    furosemide (LASIX) 20 MG  tablet, Take 1 tablet by mouth daily for 3 days, then take as needed for fluid (Patient taking differently: Take 20 mg by mouth daily as needed for edema.), Disp: 30 tablet, Rfl: 3   losartan (COZAAR) 25 MG tablet, Take 1 tablet (25 mg total) by mouth daily., Disp: 90 tablet, Rfl: 3   methocarbamol (ROBAXIN) 500 MG tablet, Take 1-2 tablets (500-1,000 mg total) by mouth every 6 to 8 hours as needed for muscle spasms/tension., Disp: 60 tablet, Rfl: 2   molnupiravir EUA (LAGEVRIO) 200 mg CAPS capsule, Take 4 capsules (800 mg total) by mouth 2 (two) times daily for 5 days., Disp: 40 capsule, Rfl: 0   Multiple Vitamin (MULTIVITAMIN WITH MINERALS) TABS, Take 1 tablet by mouth daily., Disp: , Rfl:    ondansetron (ZOFRAN) 4 MG tablet, Take 1 tablet (4 mg total) by mouth every 6 (six) hours as needed for nausea., Disp: 20 tablet, Rfl: 0   pantoprazole (PROTONIX) 40 MG tablet, Take 1 tablet (40 mg total) by mouth 2 (two) times daily., Disp: 180 tablet, Rfl: 3   polyethylene glycol powder (GLYCOLAX/MIRALAX) 17 GM/SCOOP powder, Take 17 g by mouth daily as needed for mild constipation., Disp: 238 g, Rfl: 0   Probiotic Product (PROBIOTIC DAILY PO), Take 1 capsule by mouth daily., Disp: , Rfl:    rosuvastatin (CRESTOR) 5 MG tablet, Take 1 tablet (5 mg total) by mouth daily., Disp: 90 tablet, Rfl: 3   sulfamethoxazole-trimethoprim (BACTRIM) 400-80 MG tablet, Take 1 tablet by mouth daily for 7  days., Disp: 7 tablet, Rfl: 0   verapamil (CALAN-SR) 180 MG CR tablet, Take 1 tablet (180 mg total) by mouth daily., Disp: 90 tablet, Rfl: 2   nitroGLYCERIN (NITROSTAT) 0.4 MG SL tablet, Place 1 tablet (0.4 mg total) under the tongue every 5 (five) minutes as needed for chest pain., Disp: 25 tablet, Rfl: 3  Observations/Objective: Today's Vitals   11/08/22 1032  Temp: 98.9 F (37.2 C)  TempSrc: Oral  Weight: 160 lb 3.2 oz (72.7 kg)  Height: 5\' 6"  (1.676 m)    General: Alert and oriented x 4. Speaking in clear and full  sentences, no audible heavy breathing, no acute distress.  Sounds alert and appropriately interactive.  Appears well.  Face symmetric.  Extraocular movements intact.  Pupils equal and round.  No nasal flaring or accessory muscle use visualized.  Assessment and Plan: 1. Positive self-administered antigen test for COVID-19 Patient presents today with positive home COVID-19 test- reporting symptoms of rhinorrhea, cough, fever, and headache. She is unable to take Paxlovid due to chronic medical conditions. She would like a prescription for molnupiravir that she has taken in the past for COVID. Patient denies shortness of breath, dyspnea, chest pain, confusion, N/V, lightheadedness. Discussed symptomatic treatment. Advised patient to call office if she does not notice any improvement in symptoms over the weekend.  - molnupiravir EUA (LAGEVRIO) 200 mg CAPS capsule; Take 4 capsules (800 mg total) by mouth 2 (two) times daily for 5 days.  Dispense: 40 capsule; Refill: 0   Follow Up Instructions: Return if symptoms worsen or fail to improve.   I discussed the assessment and treatment plan with the patient. The patient was provided an opportunity to ask questions, and all were answered. The patient agreed with the plan and demonstrated an understanding of the instructions.   The patient was advised to call back or seek an in-person evaluation if the symptoms worsen or if the condition fails to improve as anticipated.  The above assessment and management plan was discussed with the patient. The patient verbalized understanding of and has agreed to the management plan.   Alyson Reedy, FNP

## 2022-11-13 ENCOUNTER — Ambulatory Visit: Payer: Medicare Other | Admitting: Internal Medicine

## 2022-11-14 ENCOUNTER — Telehealth: Payer: Self-pay | Admitting: Gastroenterology

## 2022-11-14 NOTE — Telephone Encounter (Signed)
PT is scheduled for egd/colon on 9/6 and just recently was around covid but so far is still negative. Wants to know can she keep the procedure scheduled. Please advise

## 2022-11-15 NOTE — Telephone Encounter (Signed)
Spoke with patient & she had COVID 1 week ago. She is feeling better, does have a slight cough left over. Procedure is still a couple weeks out, advised patient that she should be fine to continue with procedure as planned.

## 2022-11-18 ENCOUNTER — Ambulatory Visit: Payer: Medicare Other

## 2022-11-18 ENCOUNTER — Other Ambulatory Visit (HOSPITAL_BASED_OUTPATIENT_CLINIC_OR_DEPARTMENT_OTHER): Payer: Medicare Other

## 2022-11-18 ENCOUNTER — Telehealth (HOSPITAL_BASED_OUTPATIENT_CLINIC_OR_DEPARTMENT_OTHER): Payer: Self-pay | Admitting: Family Medicine

## 2022-11-18 DIAGNOSIS — N179 Acute kidney failure, unspecified: Secondary | ICD-10-CM | POA: Diagnosis not present

## 2022-11-18 NOTE — Telephone Encounter (Signed)
Dr Chales Abrahams is wanting Mrs Allensworth to see [facility] --she is needing a referral from the PCP

## 2022-11-19 ENCOUNTER — Encounter: Payer: Self-pay | Admitting: Gastroenterology

## 2022-11-19 LAB — BASIC METABOLIC PANEL: CO2: 20 mmol/L (ref 20–29)

## 2022-11-19 NOTE — Telephone Encounter (Signed)
Spoke with patient, she is agreeable to the intermittent monitoring as long as we keep an eye on her levels. She is pleased to see her levels improving a bit

## 2022-11-22 ENCOUNTER — Telehealth: Payer: Self-pay | Admitting: Gastroenterology

## 2022-11-22 ENCOUNTER — Other Ambulatory Visit: Payer: Self-pay

## 2022-11-22 DIAGNOSIS — K219 Gastro-esophageal reflux disease without esophagitis: Secondary | ICD-10-CM

## 2022-11-22 DIAGNOSIS — K449 Diaphragmatic hernia without obstruction or gangrene: Secondary | ICD-10-CM

## 2022-11-22 DIAGNOSIS — K922 Gastrointestinal hemorrhage, unspecified: Secondary | ICD-10-CM

## 2022-11-22 DIAGNOSIS — Z86010 Personal history of colonic polyps: Secondary | ICD-10-CM

## 2022-11-22 NOTE — Telephone Encounter (Signed)
PT rescheduled colonoscopy and needs to have new instructions with the correct times. Please advise.

## 2022-11-22 NOTE — Telephone Encounter (Signed)
Instructions were redone and sent via mail and MyChart. Contacted pt & LVM to return call regarding instructions.

## 2022-11-26 ENCOUNTER — Encounter (HOSPITAL_BASED_OUTPATIENT_CLINIC_OR_DEPARTMENT_OTHER): Payer: Medicare Other

## 2022-11-29 ENCOUNTER — Encounter: Payer: Medicare Other | Admitting: Gastroenterology

## 2022-12-03 ENCOUNTER — Encounter (HOSPITAL_BASED_OUTPATIENT_CLINIC_OR_DEPARTMENT_OTHER): Payer: Medicare Other

## 2022-12-10 ENCOUNTER — Ambulatory Visit
Admission: RE | Admit: 2022-12-10 | Discharge: 2022-12-10 | Disposition: A | Discharge status: Home / Self Care | Payer: Medicare Other | Source: Ambulatory Visit | Attending: Family Medicine | Admitting: Family Medicine

## 2022-12-10 DIAGNOSIS — Z1231 Encounter for screening mammogram for malignant neoplasm of breast: Secondary | ICD-10-CM | POA: Diagnosis not present

## 2022-12-14 ENCOUNTER — Other Ambulatory Visit (HOSPITAL_BASED_OUTPATIENT_CLINIC_OR_DEPARTMENT_OTHER): Payer: Self-pay

## 2022-12-14 MED FILL — Losartan Potassium Tab 25 MG: ORAL | 90 days supply | Qty: 90 | Fill #0 | Status: AC

## 2022-12-17 ENCOUNTER — Ambulatory Visit (HOSPITAL_BASED_OUTPATIENT_CLINIC_OR_DEPARTMENT_OTHER): Payer: Medicare Other | Admitting: Family Medicine

## 2022-12-17 ENCOUNTER — Other Ambulatory Visit (HOSPITAL_BASED_OUTPATIENT_CLINIC_OR_DEPARTMENT_OTHER): Payer: Self-pay

## 2022-12-17 ENCOUNTER — Encounter (HOSPITAL_BASED_OUTPATIENT_CLINIC_OR_DEPARTMENT_OTHER): Payer: Self-pay | Admitting: Family Medicine

## 2022-12-17 VITALS — BP 132/74 | HR 82 | Ht 66.5 in | Wt 161.7 lb

## 2022-12-17 DIAGNOSIS — Z23 Encounter for immunization: Secondary | ICD-10-CM

## 2022-12-17 DIAGNOSIS — I10 Essential (primary) hypertension: Secondary | ICD-10-CM | POA: Diagnosis not present

## 2022-12-17 DIAGNOSIS — M7989 Other specified soft tissue disorders: Secondary | ICD-10-CM | POA: Diagnosis not present

## 2022-12-17 DIAGNOSIS — N1832 Chronic kidney disease, stage 3b: Secondary | ICD-10-CM

## 2022-12-17 NOTE — Progress Notes (Signed)
    Procedures performed today:    None.  Independent interpretation of notes and tests performed by another provider:   None.  Brief History, Exam, Impression, and Recommendations:    BP 132/74 (BP Location: Right Arm, Patient Position: Sitting, Cuff Size: Normal)   Pulse 82   Ht 5' 6.5" (1.689 m)   Wt 161 lb 11.2 oz (73.3 kg)   LMP  (LMP Unknown)   SpO2 99%   BMI 25.71 kg/m   Stage 3b chronic kidney disease (HCC) Assessment & Plan: Patient previously had elevation in creatinine to 1.8 which is above baseline of around 1.4.  She did have recheck of creatinine about 1 month ago and this was found to have returned to baseline.  She did have questions about possible referral to nephrology. We discussed today slightly impaired kidney function based on baseline creatinine.  Discussed importance of controlling for factors which can impact kidney function including medications, blood pressure.  Given current baseline, would not necessarily need to proceed with referral to nephrology at this time and typically we will consider this if there is more notable progression in declining kidney function. At this time, we can continue with intermittent monitoring of kidney function and being mindful of factors which can impact kidney function.  Consider referral in the future should any worsening occur or any other concerns arise.   Swelling of lower extremity Assessment & Plan: Patient reports that she recently has been having swelling in bilateral lower extremities.  She also endorses mild shortness of breath.  Has not had any significant pain in lower extremities. On exam, vital signs are stable, patient in no acute distress.  Lungs clear to auscultation bilaterally.  Bilateral lower extremities with 1+ pitting edema. Discussed general considerations, exam generally reassuring.  We did discuss conservative measures including intermittent elevation of lower extremities, use of compression  stockings.  I do recommend arranging for follow-up with cardiology office for further evaluation and recommendations.   Primary hypertension Assessment & Plan: Blood pressure controlled in office today.  No current issues with chest pain, headaches, dizziness.  She continues with medications as prescribed. Given current blood pressure control, can continue with medications as prescribed, no changes made today Recommend intermittent monitoring of blood pressure at home, DASH diet   Encounter for immunization -     Flu Vaccine Trivalent High Dose (Fluad)  Return in about 4 months (around 04/18/2023) for hypertension.   ___________________________________________ Fuller Makin [name], MD, ABFM, CAQSM Primary Care and Sports Medicine [facility]

## 2022-12-27 ENCOUNTER — Ambulatory Visit: Payer: Medicare Other | Attending: Physician Assistant | Admitting: Physician Assistant

## 2022-12-27 ENCOUNTER — Encounter: Payer: Self-pay | Admitting: Physician Assistant

## 2022-12-27 ENCOUNTER — Other Ambulatory Visit (HOSPITAL_BASED_OUTPATIENT_CLINIC_OR_DEPARTMENT_OTHER): Payer: Self-pay

## 2022-12-27 VITALS — BP 128/60 | HR 70 | Ht 66.0 in | Wt 161.0 lb

## 2022-12-27 DIAGNOSIS — E785 Hyperlipidemia, unspecified: Secondary | ICD-10-CM

## 2022-12-27 DIAGNOSIS — R600 Localized edema: Secondary | ICD-10-CM | POA: Diagnosis not present

## 2022-12-27 DIAGNOSIS — I2510 Atherosclerotic heart disease of native coronary artery without angina pectoris: Secondary | ICD-10-CM

## 2022-12-27 DIAGNOSIS — I700 Atherosclerosis of aorta: Secondary | ICD-10-CM

## 2022-12-27 DIAGNOSIS — I10 Essential (primary) hypertension: Secondary | ICD-10-CM | POA: Diagnosis not present

## 2022-12-27 DIAGNOSIS — R0609 Other forms of dyspnea: Secondary | ICD-10-CM | POA: Diagnosis not present

## 2022-12-27 DIAGNOSIS — I77819 Aortic ectasia, unspecified site: Secondary | ICD-10-CM | POA: Diagnosis not present

## 2022-12-27 MED ORDER — FUROSEMIDE 20 MG PO TABS
20.0000 mg | ORAL_TABLET | Freq: Every day | ORAL | 1 refills | Status: DC | PRN
  Filled 2022-12-27: qty 90, 90d supply, fill #0
  Filled 2023-08-03: qty 90, 90d supply, fill #1

## 2022-12-27 NOTE — Progress Notes (Signed)
Office Visit    Patient Name: Sara [name] Date of Encounter: 12/27/2022  PCP:  [name], Raymond J, MD   [facility]  Cardiologist:  Donato Schultz, MD  Advanced Practice Provider:  No care team member to display Electrophysiologist:  Sherryl Manges, MD   Chief Complaint    Sara [name] is a 77 y.o. female with a past medical history of CAD status post PCI with DES to proximal/mid RCA, SVT/atrial ectopy, LVH (not meeting criteria for HCM), hypertension, hyperlipidemia, CKD 3a presents today for follow-up appointment.  The patient was last seen by Dr. Graciela Husbands 09/18/2020 for palpitations and chest discomfort at the request of Dr. Daleen Squibb.  Patient wore a cardiac monitor showing SVT.  Patient was started on verapamil and her echo showed moderate asymmetric LVH on the basal/septal segment.  Nuclear stress test showed prior inferior infarct and minimal peri-infarct ischemia.  Cardiac MRI to evaluate for HCM showed asymmetric LVH but did not meet criteria for HCM.  At the patient's visit with Dr. Graciela Husbands 01/15/2022 there was a question of HFpEF given complaints of DOE with associated edema.  Was started on low-dose diuretics.  Patient was reevaluated by Ronie Spies, PA-C on 02/27/2022 for preoperative cardiac risk assessment pending hip arthroplasty.  At that time, she was complaining of a 51-month history of DOE that was slightly better with the addition of Lasix.  Labs were normal.  Echo was unchanged from 2022.  She underwent cardiac catheterization PET/CT at the suggestion of Dr. Graciela Husbands which showed evidence of ischemia.  Underwent [facility] 04/29/2022 and received PCI with DES to proximal/mid RCA.  Patient was last seen 05/13/2022 and at that time she reported her back pain had improved since her catheterization.  Still becomes dyspneic with longer distances but felt this was slowly improving.  Stated that walking into the office did not cause shortness of breath and this was an  issue previously.  Hypertensive in the office 162/84 and then 138/78 recheck.  She denied headaches, dizziness, lightheadedness or vision changes.  No chest pain or shortness of breath.  No lower extremity edema.  She does have as needed Lasix to use since she had previous lower extremity edema but had not needed it recently.  She was seen by me 4/1, she tells me that she has had a bit of a complicated course following her stent placement. She had some pains under he left breast and also a positive stool sample for blood. Her WBCs were elevated and she was started on an antibiotic. Most recent WBC count is normal and pain subsided. She does continue to have issues with her blood pressure being elevated. She was offered to increase her verapamil or add a new medication. She feels like her palpitations are well controlled so she opted to add a new medication.    Having some issues with SOB and her feet swelling. Hiatal hernia could be contributing to her SOB.  She does have some swelling.  Has not been taking her Lasix.  Creatinine 1.8 at 1 Point and Likely Is Compounded 1.4 Which Is Her Baseline.  She Has Been Avoiding Salt.  We Discussed Sticking with Lower Extremity Stockings/Socks Whenever Able.  She Is Elevating Her Feet As Well.  Otherwise, No Chest Pain.She is on 1 Antiplatelet Agent Plavix).  She Also Tells Me That eventually she will need her hip replaced and I explained that typically we will get a preop clearance request for this type of  surgery and we can plan on holding her Plavix at that time   Reports no chest pain, pressure, or tightness. No orthopnea, PND. Reports no palpitations.   Past Medical History    Past Medical History:  Diagnosis Date   Anemia 08/13/2022   Anxiety 07/2020   Aortic dilatation (HCC) 03/25/2022   38 mm noted on echo   Arthritis 11/2017   Barrett's esophagus    CAD (coronary artery disease)    Cataract    had surgery   Chronic kidney disease, stage 3a (HCC)     GERD (gastroesophageal reflux disease)    hx of    HIATAL HERNIA    s/p surgical repair   Hyperlipidemia    HYPERTENSION    Kidney stone    1990s   Known medical problems 07/12/2022   LVH (left ventricular hypertrophy)    Osteoporosis    PAT (paroxysmal atrial tachycardia)    Premature atrial contractions    Past Surgical History:  Procedure Laterality Date   ABDOMINAL HYSTERECTOMY  06/12/2012   CATARACT EXTRACTION  2009   both eyes   CHOLECYSTECTOMY  11/02/2012   Procedure: CHOLECYSTECTOMY;  Surgeon: Valarie Merino, MD;  Location: [facility];  Service: General;;   COLOSTOMY     CORONARY STENT INTERVENTION N/A 04/29/2022   Procedure: CORONARY STENT INTERVENTION;  Surgeon: Yvonne Kendall, MD;  Location: [facility]IVE CV [facility];  Service: Cardiovascular;  Laterality: N/A;   CORONARY ULTRASOUND/IVUS N/A 04/29/2022   Procedure: Intravascular Ultrasound/IVUS;  Surgeon: Yvonne Kendall, MD;  Location: [facility]VAS[facility] CV LAB;  Service: Cardiovascular;  Laterality: N/A;   CYSTOCELE REPAIR N/A 06/12/2012   Procedure: ANTERIOR REPAIR (CYSTOCELE);  Surgeon: Robley Fries, MD;  Location: [facility];  Service: Gynecology;  Laterality: N/A;   ESOPHAGOGASTRODUODENOSCOPY (EGD) WITH PROPOFOL N/A 07/13/2022   Procedure: ESOPHAGOGASTRODUODENOSCOPY (EGD) WITH PROPOFOL;  Surgeon: Jeani Hawking, MD;  Location: [facility];  Service: Gastroenterology;  Laterality: N/A;   EYE SURGERY  Cataract removal 2009   HERNIA REPAIR  2011   HIATAL HERNIA REPAIR  2011   LITHOTRIPSY     RIGHT/LEFT HEART CATH AND CORONARY ANGIOGRAPHY N/A 04/29/2022   Procedure: RIGHT/LEFT HEART CATH AND CORONARY ANGIOGRAPHY;  Surgeon: Yvonne Kendall, MD;  Location: [facility];  Service: Cardiovascular;  Laterality: N/A;   TONSILLECTOMY  1968   UPPER GASTROINTESTINAL ENDOSCOPY     UPPER GI ENDOSCOPY  11/02/2012   Procedure: UPPER GI ENDOSCOPY;  Surgeon: Valarie Merino, MD;  Location: [facility];  Service: General;;   VAGINAL  HYSTERECTOMY N/A 06/12/2012   Procedure: HYSTERECTOMY VAGINAL;  Surgeon: Robley Fries, MD;  Location: [facility];  Service: Gynecology;  Laterality: N/A;    Allergies  Allergies  Allergen Reactions   Augmentin [Amoxicillin-Pot Clavulanate] Diarrhea   Metoprolol     Had prior to cor CT, felt very dizzy   Nsaids     Avoid due to diminished kidney function     EKGs/Labs/Other Studies Reviewed:   The following studies were reviewed today:  Coronary CT 05/29/2021: Calcium score 929 (95th percentile).  Significant artifact obscuring mid to distal vessels.  Likely no severe stenosis in mid to distal LAD and LCx.  Not sent for FFR. Echo 03/27/2022: EF 60 to 65%.  Mild asymmetric LVH basal-septal segment.  Trivial MR.  Aortic valve sclerosis/calcification present without stenosis.  Borderline dilatation of ascending aorta 38 mm. Cardiac PET/CT 04/16/2022: LV perfusion is abnormal with evidence of ischemia.  Reversible medium defect with moderate reduction  in uptake apical to basal inferior and inferoseptal.  Severe coronary calcifications present LAD, LCx, and RCA.  Severely abnormal blood flow reserve RCA distribution. R/LHC 04/29/2022: Nonobstructive CAD LM, LAD, LCx.  Proximal/mid RCA 99%.  PCI with DES 3.0 x 32 mm to proximal/mid RCA.  Normal left and right heart filling pressures, normal Fick cardiac output/index.  Cardiac event monitor 08/15/2020: Predominant underlying rhythm was sinus rhythm.  417 supraventricular tachycardia runs fastest lasting 1 minute 51 seconds max rate 266 bpm, longest 2 minutes 1 second with average rate 216 bpm.  Rare PACs and PVCs.  No atrial fibrillation.  Patient was symptomatic during SVT episodes.  Echo 10/10/2020: EF 55 to 60%.  Moderate asymmetric LVH of the basal-septal segment.  Trivial MR.  Mild aortic valve sclerosis without stenosis.  Mild dilatation of ascending aorta 38 mm. Cardiac MRI 10/31/2020: Asymmetric LVH measuring 12 mm and basal septum, does not meet  criteria for HCM.  No suggestion of myocardial scar.  LV/RV size and function normal.  Large hiatal hernia. Repeat echo 03/27/2022 outlined above (see CAD).  EKG:  EKG is not ordered today.   Recent Labs: 02/27/2022: NT-Pro BNP 203; TSH 1.050 07/15/2022: Magnesium 1.8 11/04/2022: ALT 11; Hemoglobin 12.1; Platelets 263.0 11/18/2022: BUN 11; Creatinine, Ser 1.40; Potassium 4.2; Sodium 138  Recent Lipid Panel    Component Value Date/Time   CHOL 120 10/09/2021 0720   TRIG 92 10/09/2021 0720   HDL 55 10/09/2021 0720   CHOLHDL 2.2 10/09/2021 0720   CHOLHDL 2 05/31/2020 0841   VLDL 19.6 05/31/2020 0841   LDLCALC 47 10/09/2021 0720   Home Medications   No outpatient medications have been marked as taking for the 12/27/22 encounter (Appointment) with Sharlene Dory, PA-C.     Review of Systems      All other systems reviewed and are otherwise negative except as noted above.  Physical Exam    VS:  LMP  (LMP Unknown)  , BMI There is no height or weight on file to calculate BMI.  Wt Readings from Last 3 Encounters:  12/17/22 161 lb 11.2 oz (73.3 kg)  11/08/22 160 lb 3.2 oz (72.7 kg)  11/04/22 163 lb (73.9 kg)     GEN: Well nourished, well developed, in no acute distress. HEENT: normal. Neck: Supple, no JVD, carotid bruits, or masses. Cardiac: RRR, no murmurs, rubs, or gallops. No clubbing, cyanosis,  bilateral 1-2 +edema.  Radials/PT 2+ and equal bilaterally.  Respiratory:  Respirations regular and unlabored, clear to auscultation bilaterally. GI: Soft, nontender, nondistended. MS: No deformity or atrophy. Skin: Warm and dry, no rash. Neuro:  Strength and sensation are intact. Psych: Normal affect.  Assessment & Plan    CAD with recent PCI to proximal to mid RCA -Continue current medications including  Plavix 75 mg daily, Lasix 20 mg daily, nitro as needed, Crestor 5 mg daily -add lasix 20mg  x 3 days and then PRN  -BMP, lipid panel, and LFTs next week -update echo -she  eventually will need hip surgery where they will need to stop her DAPT. Would suggest addressing this at her next appointment with [place]   Palpitations/SVT/atrial ectopy -Overall well-controlled on current dose of verapamil 180 mg daily -Heart rate is 70 today  Asymmetric septal hypertrophy/aortic dilatation -will continue to monitor closely -please monitor your BP with added medication today  Hypertension -continue losartan 25mg  daily, BP well controlled today - I've asked the patient to monitor BP closely and send me those values after 2  weeks  Hyperlipidemia -Most recent LDL 47, goal less than 70 due to CAD -HDL is 55 and total cholesterol is 120.  Triglycerides 92 -Continue current medications including Crestor 5 mg daily -lipid panel and LFTs ordered today    Disposition: Follow up 2 months with Donato Schultz, MD or APP.  Signed, Sharlene Dory, PA-C 12/27/2022, 1:17 PM [facility]

## 2022-12-27 NOTE — Patient Instructions (Signed)
Medication Instructions:   FOR THREE DAYS ONLY:  TAKE FUROSEMIDE 20 MG  ONCE  A DAY  THEN RESUME TAKING AS NEEDED    *If you need a refill on your cardiac medications before your next appointment, please call your pharmacy*   Lab Work: RETURN FOR LABS  ON 01-03-23   BMET LFT LIPIDS AND BNP    If you have labs (blood work) drawn today and your tests are completely normal, you will receive your results only by: MyChart Message (if you have MyChart) OR A paper copy in the mail If you have any lab test that is abnormal or we need to change your treatment, we will call you to review the results.   Testing/Procedures:  Your physician has requested that you have an echocardiogram. Echocardiography is a painless test that uses sound waves to create images of your heart. It provides your doctor with information about the size and shape of your heart and how well your heart's chambers and valves are working. This procedure takes approximately one hour. There are no restrictions for this procedure. Please do NOT wear cologne, perfume, aftershave, or lotions (deodorant is allowed). Please arrive 15 minutes prior to your appointment time.     Follow-Up: At [facility], you and your health needs are our priority.  As part of our continuing mission to provide you with exceptional heart care, we have created designated Provider Care Teams.  These Care Teams include your primary Cardiologist (physician) and Advanced Practice Providers (APPs -  Physician Assistants and Nurse Practitioners) who all work together to provide you with the care you need, when you need it.  We recommend signing up for the patient portal called "MyChart".  Sign up information is provided on this After Visit Summary.  MyChart is used to connect with patients for Virtual Visits (Telemedicine).  Patients are able to view lab/test results, encounter notes, upcoming appointments, etc.  Non-urgent messages can be sent to your  provider as well.   To learn more about what you can do with MyChart, go to [email or web address].    Your next appointment:  AS SCHEDULED WITH DR Anne Fu   Other Instructions

## 2022-12-30 NOTE — Assessment & Plan Note (Signed)
Blood pressure controlled in office today.  No current issues with chest pain, headaches, dizziness.  She continues with medications as prescribed. Given current blood pressure control, can continue with medications as prescribed, no changes made today Recommend intermittent monitoring of blood pressure at home, DASH diet

## 2022-12-30 NOTE — Assessment & Plan Note (Signed)
Patient reports that she recently has been having swelling in bilateral lower extremities.  She also endorses mild shortness of breath.  Has not had any significant pain in lower extremities. On exam, vital signs are stable, patient in no acute distress.  Lungs clear to auscultation bilaterally.  Bilateral lower extremities with 1+ pitting edema. Discussed general considerations, exam generally reassuring.  We did discuss conservative measures including intermittent elevation of lower extremities, use of compression stockings.  I do recommend arranging for follow-up with cardiology office for further evaluation and recommendations.

## 2022-12-30 NOTE — Assessment & Plan Note (Signed)
Patient previously had elevation in creatinine to 1.8 which is above baseline of around 1.4.  She did have recheck of creatinine about 1 month ago and this was found to have returned to baseline.  She did have questions about possible referral to nephrology. We discussed today slightly impaired kidney function based on baseline creatinine.  Discussed importance of controlling for factors which can impact kidney function including medications, blood pressure.  Given current baseline, would not necessarily need to proceed with referral to nephrology at this time and typically we will consider this if there is more notable progression in declining kidney function. At this time, we can continue with intermittent monitoring of kidney function and being mindful of factors which can impact kidney function.  Consider referral in the future should any worsening occur or any other concerns arise.

## 2023-01-01 ENCOUNTER — Ambulatory Visit: Payer: Medicare Other | Admitting: Obstetrics and Gynecology

## 2023-01-03 ENCOUNTER — Ambulatory Visit (INDEPENDENT_AMBULATORY_CARE_PROVIDER_SITE_OTHER): Payer: Medicare Other | Admitting: Family Medicine

## 2023-01-03 ENCOUNTER — Ambulatory Visit: Payer: Medicare Other | Attending: Physician Assistant

## 2023-01-03 ENCOUNTER — Encounter (HOSPITAL_BASED_OUTPATIENT_CLINIC_OR_DEPARTMENT_OTHER): Payer: Self-pay | Admitting: Family Medicine

## 2023-01-03 VITALS — BP 113/50 | HR 67 | Ht 66.0 in | Wt 154.0 lb

## 2023-01-03 DIAGNOSIS — R051 Acute cough: Secondary | ICD-10-CM

## 2023-01-03 DIAGNOSIS — I700 Atherosclerosis of aorta: Secondary | ICD-10-CM | POA: Diagnosis not present

## 2023-01-03 DIAGNOSIS — R0609 Other forms of dyspnea: Secondary | ICD-10-CM | POA: Diagnosis not present

## 2023-01-03 NOTE — Progress Notes (Signed)
Acute Office Visit  Subjective:     Patient ID: Sara [name], female    DOB: Feb 04, 1946, 77 y.o.   MRN: [id number]  Chief Complaint  Patient presents with   Cough    Has a cough since Sunday, feels some tightness in chest when breathing, feels phlem in chest at night time but cannot cough anything up. Tested negative for covid, denies fever   Patient is a 77 year old female patient who presents today for a cough. She reports that last Sunday, she took a drink of water and it went down the "wrong tube." She reports that since then she has a cough and is concerned. She wants to make sure her lungs are clear. She reports she feels tightness in her chest and coughs whenever she takes a deep breath or holds her breath. Denies fever/chills, sore throat, body aches, wheezing, N/V, nasal congestion, sore throat, headache, fatigue.   Review of Systems  Constitutional:  Negative for chills, fever, malaise/fatigue and weight loss.  HENT:  Negative for congestion and sore throat.   Respiratory:  Positive for cough. Negative for hemoptysis, sputum production, shortness of breath and wheezing.   Cardiovascular:  Negative for chest pain and leg swelling.  Gastrointestinal:  Negative for nausea and vomiting.  Genitourinary:  Negative for frequency and urgency.  Musculoskeletal:  Negative for myalgias.  Neurological:  Negative for weakness and headaches.       Objective:    BP (!) 113/50   Pulse 67   Ht 5\' 6"  (1.676 m)   Wt 154 lb (69.9 kg)   LMP  (LMP Unknown)   SpO2 99%   BMI 24.86 kg/m   Physical Exam Vitals reviewed.  Constitutional:      Appearance: Normal appearance.  Cardiovascular:     Rate and Rhythm: Normal rate and regular rhythm.     Pulses: Normal pulses.     Heart sounds: Normal heart sounds.  Pulmonary:     Effort: Pulmonary effort is normal.     Breath sounds: Normal breath sounds.  Neurological:     Mental Status: She is alert.  Psychiatric:        Mood and  Affect: Mood normal.        Behavior: Behavior normal.    Assessment & Plan:   1. Acute cough Patient is a pleasant 77 year old female patient who presents today for cough about x1 week. SpO2 99% with regular respiratory rate and normal effort. Patient in no acute distress and is well-appearing. Cardiovascular exam with regular heart rate and rhythm. Normal heart sounds, no murmurs present. Lungs clear to auscultation bilaterally. No adventitious lung sounds present. No stridor, wheezing or crackles present. Provided patient with reassurance that this is less likely pneumonia. Discussed use of OTC coricidin for symptomatic treatment of cough, along with adequate hydration and use of warm fluids/honey to help decrease cough. Advised patient to return to office if symptoms worsen or fail to improve.    Return if symptoms worsen or fail to improve.  Alyson Reedy, FNP

## 2023-01-04 LAB — BASIC METABOLIC PANEL
BUN/Creatinine Ratio: 16 (ref 12–28)
BUN: 26 mg/dL (ref 8–27)
CO2: 23 mmol/L (ref 20–29)
Calcium: 8.8 mg/dL (ref 8.7–10.3)
Chloride: 97 mmol/L (ref 96–106)
Creatinine, Ser: 1.58 mg/dL — ABNORMAL HIGH (ref 0.57–1.00)
Glucose: 106 mg/dL — ABNORMAL HIGH (ref 70–99)
Potassium: 3.2 mmol/L — ABNORMAL LOW (ref 3.5–5.2)
Sodium: 135 mmol/L (ref 134–144)
eGFR: 34 mL/min/{1.73_m2} — ABNORMAL LOW (ref >59)

## 2023-01-04 LAB — HEPATIC FUNCTION PANEL
ALT: 25 [IU]/L (ref 0–32)
AST: 38 [IU]/L (ref 0–40)
Albumin: 3.7 g/dL — ABNORMAL LOW (ref 3.8–4.8)
Alkaline Phosphatase: 73 [IU]/L (ref 44–121)
Bilirubin Total: 0.5 mg/dL (ref 0.0–1.2)
Bilirubin, Direct: 0.22 mg/dL (ref 0.00–0.40)
Total Protein: 6.1 g/dL (ref 6.0–8.5)

## 2023-01-04 LAB — LIPID PANEL
Chol/HDL Ratio: 2.4 {ratio} (ref 0.0–4.4)
Cholesterol, Total: 109 mg/dL (ref 100–199)
HDL: 45 mg/dL (ref >39)
LDL Chol Calc (NIH): 45 mg/dL (ref 0–99)
Triglycerides: 104 mg/dL (ref 0–149)
VLDL Cholesterol Cal: 19 mg/dL (ref 5–40)

## 2023-01-04 LAB — PRO B NATRIURETIC PEPTIDE: NT-Pro BNP: 201 pg/mL (ref 0–738)

## 2023-01-06 ENCOUNTER — Telehealth: Payer: Self-pay

## 2023-01-06 ENCOUNTER — Other Ambulatory Visit (HOSPITAL_BASED_OUTPATIENT_CLINIC_OR_DEPARTMENT_OTHER): Payer: Self-pay

## 2023-01-06 DIAGNOSIS — E876 Hypokalemia: Secondary | ICD-10-CM

## 2023-01-06 MED ORDER — POTASSIUM CHLORIDE CRYS ER 20 MEQ PO TBCR
EXTENDED_RELEASE_TABLET | ORAL | 3 refills | Status: DC
  Filled 2023-01-06: qty 90, 87d supply, fill #0
  Filled 2023-03-27: qty 90, 90d supply, fill #1

## 2023-01-06 NOTE — Telephone Encounter (Signed)
-----   Message from Sharlene Dory sent at 01/06/2023  8:14 AM EDT ----- Ms Donnie Aho,   Your potassium is low. I'm going to have triage send in 20 MEQ that you will take BID for three days and then you will take 20 MEQ thereafter with your lasix.   Lets recheck a BMP in a week.  Liver function looks good and your fluid level looks good.  Sharlene Dory, PA-C

## 2023-01-06 NOTE — Telephone Encounter (Signed)
The patient has been notified of the result and verbalized understanding.  All questions (if any) were answered. Macie Burows, RN 01/06/2023 2:04 PM   F/U labs scheduled for 01/14/23.

## 2023-01-07 ENCOUNTER — Encounter: Payer: Self-pay | Admitting: Physician Assistant

## 2023-01-09 ENCOUNTER — Encounter: Payer: Self-pay | Admitting: Gastroenterology

## 2023-01-13 ENCOUNTER — Ambulatory Visit (HOSPITAL_BASED_OUTPATIENT_CLINIC_OR_DEPARTMENT_OTHER)
Admission: RE | Admit: 2023-01-13 | Discharge: 2023-01-13 | Disposition: A | Discharge status: Home / Self Care | Payer: Medicare Other | Source: Ambulatory Visit | Attending: Family Medicine | Admitting: Family Medicine

## 2023-01-13 ENCOUNTER — Ambulatory Visit (INDEPENDENT_AMBULATORY_CARE_PROVIDER_SITE_OTHER): Payer: Medicare Other | Admitting: Family Medicine

## 2023-01-13 ENCOUNTER — Encounter (HOSPITAL_BASED_OUTPATIENT_CLINIC_OR_DEPARTMENT_OTHER): Payer: Self-pay | Admitting: Family Medicine

## 2023-01-13 ENCOUNTER — Other Ambulatory Visit (HOSPITAL_BASED_OUTPATIENT_CLINIC_OR_DEPARTMENT_OTHER): Payer: Self-pay

## 2023-01-13 VITALS — BP 142/69 | HR 86 | Temp 97.9°F | Ht 66.0 in | Wt 156.4 lb

## 2023-01-13 DIAGNOSIS — R058 Other specified cough: Secondary | ICD-10-CM | POA: Diagnosis not present

## 2023-01-13 DIAGNOSIS — R051 Acute cough: Secondary | ICD-10-CM

## 2023-01-13 DIAGNOSIS — I771 Stricture of artery: Secondary | ICD-10-CM | POA: Diagnosis not present

## 2023-01-13 DIAGNOSIS — I700 Atherosclerosis of aorta: Secondary | ICD-10-CM | POA: Diagnosis not present

## 2023-01-13 DIAGNOSIS — K449 Diaphragmatic hernia without obstruction or gangrene: Secondary | ICD-10-CM | POA: Diagnosis not present

## 2023-01-13 DIAGNOSIS — R059 Cough, unspecified: Secondary | ICD-10-CM | POA: Insufficient documentation

## 2023-01-13 MED ORDER — BENZONATATE 200 MG PO CAPS
200.0000 mg | ORAL_CAPSULE | Freq: Three times a day (TID) | ORAL | 0 refills | Status: DC | PRN
  Filled 2023-01-13: qty 45, 15d supply, fill #0

## 2023-01-13 NOTE — Progress Notes (Signed)
    Procedures performed today:    None.  Independent interpretation of notes and tests performed by another provider:   None.  Brief History, Exam, Impression, and Recommendations:    BP (!) 142/69 (BP Location: Right Arm, Patient Position: Sitting, Cuff Size: Normal)   Pulse 86   Temp 97.9 F (36.6 C) (Oral)   Ht 5\' 6"  (1.676 m)   Wt 156 lb 6.4 oz (70.9 kg)   LMP  (LMP Unknown)   SpO2 99%   BMI 25.24 kg/m   Acute cough Assessment & Plan: Patient has had ongoing cough now for about 2 weeks.  She reports that it initially started when she aspirated a small amount of water during lunch 1 day.  She is not aware of any sick contacts prior to onset of cough.  She has not had any other notable symptoms such as fever, chills, sweats.  Denies any significant fatigue.  Cough can occur at any time during the day.  She is not aware of any specific triggers for the cough.  She did have evaluation here in the office with recommendation regarding conservative measures, however since cough has persisted, she returns today for further evaluation. On exam, patient is in no acute distress, vital signs stable.  Cardiovascular exam with regular rate and rhythm.  Lungs with intermittent crackles over the left lung field, right lung clear to auscultation throughout. Given abnormal lung findings on physical exam, can proceed with initial chest x-ray for further assessment.  In the meantime, can utilize conservative measures to help with cough including honey as well as Jerilynn Som, prescription sent to pharmacy.  We will manage any abnormality observed on chest x-ray accordingly  Orders: -     DG Chest 2 View; Future  Other orders -     Benzonatate; Take 1 capsule (200 mg total) by mouth 3 (three) times daily as needed for cough.  Dispense: 45 capsule; Refill: 0  Return if symptoms worsen or fail to improve.   ___________________________________________ Amato Sevillano [name], MD, ABFM, CAQSM Primary  Care and Sports Medicine [facility]

## 2023-01-13 NOTE — Assessment & Plan Note (Signed)
Patient has had ongoing cough now for about 2 weeks.  She reports that it initially started when she aspirated a small amount of water during lunch 1 day.  She is not aware of any sick contacts prior to onset of cough.  She has not had any other notable symptoms such as fever, chills, sweats.  Denies any significant fatigue.  Cough can occur at any time during the day.  She is not aware of any specific triggers for the cough.  She did have evaluation here in the office with recommendation regarding conservative measures, however since cough has persisted, she returns today for further evaluation. On exam, patient is in no acute distress, vital signs stable.  Cardiovascular exam with regular rate and rhythm.  Lungs with intermittent crackles over the left lung field, right lung clear to auscultation throughout. Given abnormal lung findings on physical exam, can proceed with initial chest x-ray for further assessment.  In the meantime, can utilize conservative measures to help with cough including honey as well as Jerilynn Som, prescription sent to pharmacy.  We will manage any abnormality observed on chest x-ray accordingly

## 2023-01-14 ENCOUNTER — Ambulatory Visit: Payer: Medicare Other | Attending: Physician Assistant

## 2023-01-14 ENCOUNTER — Telehealth (HOSPITAL_BASED_OUTPATIENT_CLINIC_OR_DEPARTMENT_OTHER): Payer: Self-pay | Admitting: *Deleted

## 2023-01-14 ENCOUNTER — Encounter (HOSPITAL_BASED_OUTPATIENT_CLINIC_OR_DEPARTMENT_OTHER): Payer: Self-pay | Admitting: *Deleted

## 2023-01-14 DIAGNOSIS — E876 Hypokalemia: Secondary | ICD-10-CM | POA: Diagnosis not present

## 2023-01-14 NOTE — Telephone Encounter (Signed)
LVM to schedule medicare wellness visit

## 2023-01-15 ENCOUNTER — Telehealth: Payer: Self-pay

## 2023-01-15 ENCOUNTER — Telehealth: Payer: Self-pay | Admitting: Gastroenterology

## 2023-01-15 ENCOUNTER — Encounter: Payer: Self-pay | Admitting: Physician Assistant

## 2023-01-15 ENCOUNTER — Ambulatory Visit (HOSPITAL_COMMUNITY): Payer: Medicare Other | Attending: Internal Medicine

## 2023-01-15 DIAGNOSIS — R0609 Other forms of dyspnea: Secondary | ICD-10-CM | POA: Diagnosis not present

## 2023-01-15 LAB — BASIC METABOLIC PANEL
BUN/Creatinine Ratio: 13 (ref 12–28)
BUN: 17 mg/dL (ref 8–27)
CO2: 24 mmol/L (ref 20–29)
Calcium: 9.6 mg/dL (ref 8.7–10.3)
Chloride: 104 mmol/L (ref 96–106)
Creatinine, Ser: 1.3 mg/dL — ABNORMAL HIGH (ref 0.57–1.00)
Glucose: 83 mg/dL (ref 70–99)
Potassium: 5 mmol/L (ref 3.5–5.2)
Sodium: 138 mmol/L (ref 134–144)
eGFR: 43 mL/min/{1.73_m2} — ABNORMAL LOW (ref >59)

## 2023-01-15 LAB — ECHOCARDIOGRAM COMPLETE
Area-P 1/2: 2.21 cm2
S' Lateral: 2.2 cm

## 2023-01-15 NOTE — Telephone Encounter (Signed)
Pt stated that she was recently diagnosed with Bronchitis and being treated with Medications. Pt had Echo done today by her cardiologist.  Pt procedure was rescheduled to 03/07/2023 at 2:30 PM. Pt made aware. Pt was scheduled for a previsit appointment on 01/28/2023 at 8:00 AM. Pt made aware.  Clearance was sent to pt cardiologist for medical clearance and clearance to hold plavix. Pt made aware Pt verbalized understanding with all questions answered.

## 2023-01-15 NOTE — Telephone Encounter (Signed)
Inbound call from patient, stating she has a procedure for 10/31. Patient states she has been having some worrying symptoms and would like to discuss with a nurse.

## 2023-01-15 NOTE — Telephone Encounter (Signed)
Preoperative team, patient underwent cardiac catheterization with stenting on 04/29/2022.  She is now only on monotherapy with Plavix.  She should take Plavix uninterrupted for 12 months before pausing the medication.  Please verify urgency of her procedure.  Thank you for your help.  Thomasene Ripple. Ashli Selders NP-C     01/15/2023, 2:14 PM [facility] [address] Office [phone] Fax [phone]

## 2023-01-15 NOTE — Telephone Encounter (Signed)
I have sent Sara Darling, RN for Dr. Chales Abrahams secure chat in regarding if procedure is of urgent matter. I will also fax these notes as well.   Per pre op APP Sara Fabian, FNP:  Preoperative team, patient underwent cardiac catheterization with stenting on 04/29/2022.  She is now only on monotherapy with Plavix.  She should take Plavix uninterrupted for 12 months before pausing the medication.  Please verify urgency of her procedure.  Thank you for your help.   Sara [name]. Cleaver NP-C      If urgent will need to re-address with MD. Pt does have an appt in office 04/04/23 with Sara [name], PAC. Will confirm with Dr. Chales Abrahams if in agreement with recommendations above.

## 2023-01-15 NOTE — Telephone Encounter (Signed)
[facility] Pre-operative Risk Assessment     Request for surgical clearance:     Endoscopy Procedure  What type of surgery is being performed?     Upper Endoscopy/Colonoscopy  When is this surgery scheduled?     03/07/2023   What type of clearance is required ?   Medical / Pharmacy  Are there any medications that need to be held prior to surgery and how long? Plavix 5 days  Practice name and name of physician performing surgery?      [facility]/ Dr. Chales Abrahams  What is your office phone and fax number?      Phone- [phone]  Fax- [phone]  Anesthesia type (None, local, MAC, general) ?       MAC

## 2023-01-16 NOTE — Telephone Encounter (Signed)
Agree with cardiology note.  Patient should not be taken off Plavix until 04/30/2023 unless emergent procedures She should be scheduled for EGD/colonoscopy in March 2025 She must see cardiology in February 2025 to make sure that she can be taken off Plavix for 5 days prior. If she has any emergency like bleeding, she should promptly get in touch with [place] [facility]

## 2023-01-17 NOTE — Telephone Encounter (Signed)
Pt made aware of Cardiologist and Dr. Chales Abrahams recommendations:  Previsit and Procedures have been canceled. Pt made aware. Pt verbalized understanding with all questions answered.

## 2023-01-20 ENCOUNTER — Telehealth (HOSPITAL_BASED_OUTPATIENT_CLINIC_OR_DEPARTMENT_OTHER): Payer: Self-pay | Admitting: Family Medicine

## 2023-01-20 NOTE — Telephone Encounter (Signed)
Pt LVM regarding the chest xray results

## 2023-01-22 NOTE — Telephone Encounter (Signed)
[facility] they will get this read and sent over today

## 2023-01-23 ENCOUNTER — Encounter: Payer: Medicare Other | Admitting: Gastroenterology

## 2023-01-24 NOTE — Telephone Encounter (Signed)
Pt advised with verbal understanding  °

## 2023-01-27 ENCOUNTER — Other Ambulatory Visit (HOSPITAL_BASED_OUTPATIENT_CLINIC_OR_DEPARTMENT_OTHER): Payer: Self-pay

## 2023-01-27 MED FILL — Rosuvastatin Calcium Tab 5 MG: ORAL | 90 days supply | Qty: 90 | Fill #1 | Status: AC

## 2023-01-30 ENCOUNTER — Other Ambulatory Visit (HOSPITAL_BASED_OUTPATIENT_CLINIC_OR_DEPARTMENT_OTHER): Payer: Self-pay

## 2023-02-02 ENCOUNTER — Encounter (HOSPITAL_BASED_OUTPATIENT_CLINIC_OR_DEPARTMENT_OTHER): Payer: Self-pay

## 2023-02-03 ENCOUNTER — Other Ambulatory Visit (HOSPITAL_BASED_OUTPATIENT_CLINIC_OR_DEPARTMENT_OTHER): Payer: Self-pay

## 2023-02-04 ENCOUNTER — Encounter: Payer: Self-pay | Admitting: Gastroenterology

## 2023-02-10 ENCOUNTER — Other Ambulatory Visit (HOSPITAL_BASED_OUTPATIENT_CLINIC_OR_DEPARTMENT_OTHER): Payer: Self-pay

## 2023-02-10 MED ORDER — COVID-19 MRNA VAC-TRIS(PFIZER) 30 MCG/0.3ML IM SUSY
0.3000 mL | PREFILLED_SYRINGE | Freq: Once | INTRAMUSCULAR | 0 refills | Status: AC
  Filled 2023-02-10: qty 0.3, 1d supply, fill #0

## 2023-02-13 ENCOUNTER — Encounter (HOSPITAL_BASED_OUTPATIENT_CLINIC_OR_DEPARTMENT_OTHER): Payer: Self-pay | Admitting: Family Medicine

## 2023-02-13 NOTE — Progress Notes (Signed)
   Rubin Payor, PhD, LAT, ATC acting as a scribe for Clementeen Graham, MD.  Sara [name] is a 77 y.o. female who presents to [facility] at [facility] today for R ankle pain. Pt was last seen by Dr. Denyse Amass in 2021 and was advised to rest, use a compression sleeve, Voltaren gel, and was taught HEP.  Today, pt reports R ankle pain returned about 1.5 wk ago. She notes that has an upcoming hip replacement and feels she has been compensating in how she walks. Pt locates pain to the lateral aspect of the R ankle.  R ankle swelling: yes  Dx imaging: 06/07/19 R ankle XR  02/06/18 R ankle XR  01/30/23 R foot & ankle XR  Pertinent review of systems: No fevers or chills  Relevant historical information: Left hip arthritis.  Planning on getting a left hip replacement graft orthopedics as soon as she has been on Plavix for a year after a heart catheterization requiring stenting.  February would be the 1 year mark where she could have a hip replacement and be off of Plavix.   Exam:  BP 130/68   Pulse 80   Ht 5\' 6"  (1.676 m)   Wt 152 lb (68.9 kg)   LMP  (LMP Unknown)   SpO2 100%   BMI 24.53 kg/m  General: Well Developed, well nourished, and in no acute distress.   MSK: Right ankle mild swelling around the lateral malleolus.  Tender palpation at the posterior and inferior portion of the lateral malleolus. Normal foot and ankle motion.  Pain present with resisted foot eversion with plantarflexion and dorsiflexion position. Intact strength. Stable ligamentous exam.    Lab and Radiology Results  Diagnostic Limited MSK Ultrasound of: Right lateral ankle Peroneal tendons are intact.  The peroneal longus tendon right around the lateral malleolus has a "C" shaped appearance which could indicate a linear split tear.  There is some hypoechoic fluid within the tendon sheath indicating tenosynovitis at least. Impression: Peroneal tenosynovitis with possible linear split  tear      Assessment and Plan: 77 y.o. female with right lateral ankle pain thought to be due to peroneal tenosynovitis.  This is an acute recurrence of a chronic problem first seen in this clinic in 2021.  Plan for compression sleeve Voltaren gel and home exercise program taught in clinic by ATC.  Recheck in a month.  If not better consider physical therapy or injection.   PDMP not reviewed this encounter. Orders Placed This Encounter  Procedures   [place] LIMITED JOINT SPACE STRUCTURES LOW RIGHT(NO LINKED CHARGES)    Order Specific Question:   Reason for Exam (SYMPTOM  OR DIAGNOSIS REQUIRED)    Answer:   right ankle pain    Order Specific Question:   Preferred imaging location?    Answer:   [facility]   No orders of the defined types were placed in this encounter.    Discussed warning signs or symptoms. Please see discharge instructions. Patient expresses understanding.   The above documentation has been reviewed and is accurate and complete Clementeen Graham, M.D.

## 2023-02-14 ENCOUNTER — Ambulatory Visit: Payer: Medicare Other | Admitting: Family Medicine

## 2023-02-14 ENCOUNTER — Other Ambulatory Visit: Payer: Self-pay

## 2023-02-14 VITALS — BP 130/68 | HR 80 | Ht 66.0 in | Wt 152.0 lb

## 2023-02-14 DIAGNOSIS — M25571 Pain in right ankle and joints of right foot: Secondary | ICD-10-CM

## 2023-02-14 DIAGNOSIS — G8929 Other chronic pain: Secondary | ICD-10-CM | POA: Diagnosis not present

## 2023-02-14 NOTE — Patient Instructions (Addendum)
Thank you for coming in today.   Please work on the home exercises the athletic trainer went over with you:  View at [email or web address]-exercise-[email or web address] using code: [id number]  Please use Voltaren gel (Generic Diclofenac Gel) up to 4x daily for pain as needed.  This is available over-the-counter as both the name brand Voltaren gel and the generic diclofenac gel.   I recommend you obtained a compression sleeve to help with your joint problems. There are many options on the market however I recommend obtaining a Full Ankle Body Helix compression sleeve.  You can find information (including how to appropriate measure yourself for sizing) can be found at www.Body GrandRapidsWifi.ch.  Many of these products are health savings account (HSA) eligible.  You can use the compression sleeve at any time throughout the day but is most important to use while being active as well as for 2 hours post-activity.   It is appropriate to ice following activity with the compression sleeve in place.   Check back in 1 month

## 2023-03-05 ENCOUNTER — Telehealth (HOSPITAL_BASED_OUTPATIENT_CLINIC_OR_DEPARTMENT_OTHER): Payer: Self-pay | Admitting: *Deleted

## 2023-03-05 NOTE — Telephone Encounter (Signed)
 LVM to schedule medicare wellness visit

## 2023-03-07 ENCOUNTER — Encounter: Payer: Medicare Other | Admitting: Gastroenterology

## 2023-03-12 MED FILL — Losartan Potassium Tab 25 MG: ORAL | 90 days supply | Qty: 90 | Fill #1 | Status: AC

## 2023-03-24 ENCOUNTER — Ambulatory Visit: Payer: Medicare Other | Admitting: Family Medicine

## 2023-03-27 ENCOUNTER — Other Ambulatory Visit (HOSPITAL_BASED_OUTPATIENT_CLINIC_OR_DEPARTMENT_OTHER): Payer: Self-pay

## 2023-04-02 NOTE — Progress Notes (Signed)
   LILLETTE Ileana Collet, PhD, LAT, ATC acting as a scribe for Artist Lloyd, MD.  Sara [name] is a 78 y.o. female who presents to [facility] at [facility] today for f/u R ankle pain thought to be due to peroneal tenosynovitis. Pt was last seen by Dr. Lloyd on 02/14/23 and was advised to use compression sleeve, Voltaren  gel, and was taught HEP.   Today, pt reports R ankle is no longer painful. She has been wearing the compression sleeve. She notes the swelling is mostly resolved.   Dx imaging: 06/07/19 R ankle XR             02/06/18 R ankle XR             01/30/23 R foot & ankle XR  Pertinent review of systems: No fevers or chills  Relevant historical information: Coronary artery disease   Exam:  BP (!) 154/92   Pulse 90   Ht 5' 6 (1.676 m)   Wt 152 lb (68.9 kg)   LMP  (LMP Unknown)   SpO2 98%   BMI 24.53 kg/m  General: Well Developed, well nourished, and in no acute distress.   MSK: Right ankle normal-appearing nontender normal motion intact strength.    Assessment and Plan: 78 y.o. female with resolving right lateral ankle pain due to peroneal tenosynovitis.  Improved with compression and home exercise program.  Plan to continue home exercise program and use compression sleeve with activity.  Check back as needed.    Discussed warning signs or symptoms. Please see discharge instructions. Patient expresses understanding.   The above documentation has been reviewed and is accurate and complete Artist Lloyd, M.D.

## 2023-04-03 ENCOUNTER — Telehealth (HOSPITAL_BASED_OUTPATIENT_CLINIC_OR_DEPARTMENT_OTHER): Payer: Self-pay | Admitting: *Deleted

## 2023-04-03 ENCOUNTER — Ambulatory Visit: Payer: Medicare Other | Admitting: Family Medicine

## 2023-04-03 VITALS — BP 154/92 | HR 90 | Ht 66.0 in | Wt 152.0 lb

## 2023-04-03 DIAGNOSIS — M25571 Pain in right ankle and joints of right foot: Secondary | ICD-10-CM

## 2023-04-03 DIAGNOSIS — G8929 Other chronic pain: Secondary | ICD-10-CM | POA: Diagnosis not present

## 2023-04-03 NOTE — Patient Instructions (Addendum)
 Thank you for coming in today.   Continue home exercises.   Use the compression sleeve with activity.   Recheck with me as needed for this or other issues.

## 2023-04-03 NOTE — Telephone Encounter (Signed)
 LVM for pt to call the office to reschedule appt 1/22 as this needs to be with nurse and not provider

## 2023-04-04 ENCOUNTER — Ambulatory Visit (HOSPITAL_BASED_OUTPATIENT_CLINIC_OR_DEPARTMENT_OTHER): Payer: Medicare Other | Admitting: Physician Assistant

## 2023-04-10 ENCOUNTER — Telehealth (HOSPITAL_BASED_OUTPATIENT_CLINIC_OR_DEPARTMENT_OTHER): Payer: Self-pay | Admitting: *Deleted

## 2023-04-10 NOTE — Telephone Encounter (Signed)
LVM for pt to schedule a follow up appt with [name] pt due at the end of Jan

## 2023-04-14 ENCOUNTER — Ambulatory Visit (HOSPITAL_BASED_OUTPATIENT_CLINIC_OR_DEPARTMENT_OTHER): Payer: Medicare Other | Admitting: Cardiology

## 2023-04-16 ENCOUNTER — Ambulatory Visit (HOSPITAL_BASED_OUTPATIENT_CLINIC_OR_DEPARTMENT_OTHER): Payer: Medicare Other | Admitting: Family Medicine

## 2023-04-18 ENCOUNTER — Ambulatory Visit (HOSPITAL_BASED_OUTPATIENT_CLINIC_OR_DEPARTMENT_OTHER): Payer: Medicare Other | Admitting: Family Medicine

## 2023-04-21 ENCOUNTER — Encounter (HOSPITAL_BASED_OUTPATIENT_CLINIC_OR_DEPARTMENT_OTHER): Payer: Self-pay

## 2023-04-21 ENCOUNTER — Ambulatory Visit (HOSPITAL_BASED_OUTPATIENT_CLINIC_OR_DEPARTMENT_OTHER): Payer: Medicare Other | Admitting: *Deleted

## 2023-04-21 DIAGNOSIS — Z0000 Encounter for general adult medical examination without abnormal findings: Secondary | ICD-10-CM | POA: Diagnosis not present

## 2023-04-21 NOTE — Progress Notes (Signed)
Subjective:   Sara [name] is a 78 y.o. female who presents for Medicare Annual (Subsequent) preventive examination.  Visit Complete: Virtual I connected with  Burman Blacksmith on 04/21/23 by a audio enabled telemedicine application and verified that I am speaking with the correct person using two identifiers.  Patient Location: Home  Provider Location: Office/Clinic  I discussed the limitations of evaluation and management by telemedicine. The patient expressed understanding and agreed to proceed.  Vital Signs: Because this visit was a virtual/telehealth visit, some criteria may be missing or patient reported. Any vitals not documented were not able to be obtained and vitals that have been documented are patient reported.  Patient Medicare AWV questionnaire was completed by the patient on 04/18/2023; I have confirmed that all information answered by patient is correct and no changes since this date.        Objective:    There were no vitals filed for this visit. There is no height or weight on file to calculate BMI.     07/16/2022    6:46 PM 07/13/2022    1:30 PM 07/12/2022    7:00 PM 04/29/2022    6:25 AM 05/20/2019   10:28 AM 02/17/2018    9:54 AM 01/22/2017    8:41 AM  Advanced Directives  Does Patient Have a Medical Advance Directive? No No No No No No No  Would patient like information on creating a medical advance directive?   No - Patient declined No - Patient declined Yes (MAU/Ambulatory/Procedural Areas - Information given)      Current Medications (verified) Outpatient Encounter Medications as of 04/21/2023  Medication Sig   acetaminophen (TYLENOL) 325 MG tablet Take 2 tablets (650 mg total) by mouth every 6 (six) hours as needed for mild pain (or Fever >/= 101).   clopidogrel (PLAVIX) 75 MG tablet Take 1 tablet (75 mg total) by mouth daily.   cyanocobalamin (VITAMIN B12) 1000 MCG tablet Take 1,000 mcg by mouth daily.   Ferrous Sulfate Dried (HIGH POTENCY  IRON) 65 MG TABS    furosemide (LASIX) 20 MG tablet Take 1 tablet (20 mg total) by mouth daily as needed for edema.   losartan (COZAAR) 25 MG tablet Take 1 tablet (25 mg total) by mouth daily.   Multiple Vitamin (MULTIVITAMIN WITH MINERALS) TABS Take 1 tablet by mouth daily.   nitroGLYCERIN (NITROSTAT) 0.4 MG SL tablet Place 1 tablet (0.4 mg total) under the tongue every 5 (five) minutes as needed for chest pain.   pantoprazole (PROTONIX) 40 MG tablet Take 1 tablet (40 mg total) by mouth 2 (two) times daily.   potassium chloride SA (KLOR-CON M) 20 MEQ tablet Take 1 tablet (20 mEq total) by mouth 2 (two) times daily for 3 days, THEN 1 tablet (20 mEq total) daily. (Patient not taking: Reported on 01/13/2023)   Probiotic Product (PROBIOTIC DAILY PO) Take 1 capsule by mouth daily.   rosuvastatin (CRESTOR) 5 MG tablet Take 1 tablet (5 mg total) by mouth daily.   verapamil (CALAN-SR) 180 MG CR tablet Take 1 tablet (180 mg total) by mouth daily.   No facility-administered encounter medications on file as of 04/21/2023.    Allergies (verified) Augmentin [amoxicillin-pot clavulanate], Metoprolol, and Nsaids   History: Past Medical History:  Diagnosis Date   Anemia 08/13/2022   Anxiety 07/2020   Aortic dilatation (HCC) 03/25/2022   38 mm noted on echo   Arthritis 11/2017   Barrett's esophagus    CAD (coronary artery disease)  Cataract    had surgery   Chronic kidney disease, stage 3a (HCC)    GERD (gastroesophageal reflux disease)    hx of    HIATAL HERNIA    s/p surgical repair   Hyperlipidemia    HYPERTENSION    Kidney stone    1990s   Known medical problems 07/12/2022   LVH (left ventricular hypertrophy)    Osteoporosis    PAT (paroxysmal atrial tachycardia) (HCC)    Premature atrial contractions    Past Surgical History:  Procedure Laterality Date   ABDOMINAL HYSTERECTOMY  06/12/2012   CATARACT EXTRACTION  2009   both eyes   CHOLECYSTECTOMY  11/02/2012   Procedure:  CHOLECYSTECTOMY;  Surgeon: Valarie Merino, MD;  Location: [facility];  Service: General;;   COLOSTOMY     CORONARY STENT INTERVENTION N/A 04/29/2022   Procedure: CORONARY STENT INTERVENTION;  Surgeon: Yvonne Kendall, MD;  Location: [facility];  Service: Cardiovascular;  Laterality: N/A;   CORONARY ULTRASOUND/IVUS N/A 04/29/2022   Procedure: Intravascular Ultrasound/IVUS;  Surgeon: Yvonne Kendall, MD;  Location: [facility];  Service: Cardiovascular;  Laterality: N/A;   CYSTOCELE REPAIR N/A 06/12/2012   Procedure: ANTERIOR REPAIR (CYSTOCELE);  Surgeon: Robley Fries, MD;  Location: [facility];  Service: Gynecology;  Laterality: N/A;   ESOPHAGOGASTRODUODENOSCOPY (EGD) WITH PROPOFOL N/A 07/13/2022   Procedure: ESOPHAGOGASTRODUODENOSCOPY (EGD) WITH PROPOFOL;  Surgeon: Jeani Hawking, MD;  Location: [facility];  Service: Gastroenterology;  Laterality: N/A;   EYE SURGERY  Cataract removal 2009   HERNIA REPAIR  2011   HIATAL HERNIA REPAIR  2011   LITHOTRIPSY     RIGHT/LEFT HEART CATH AND CORONARY ANGIOGRAPHY N/A 04/29/2022   Procedure: RIGHT/LEFT HEART CATH AND CORONARY ANGIOGRAPHY;  Surgeon: Yvonne Kendall, MD;  Location: [facility];  Service: Cardiovascular;  Laterality: N/A;   TONSILLECTOMY  1968   UPPER GASTROINTESTINAL ENDOSCOPY     UPPER GI ENDOSCOPY  11/02/2012   Procedure: UPPER GI ENDOSCOPY;  Surgeon: Valarie Merino, MD;  Location: [facility];  Service: General;;   VAGINAL HYSTERECTOMY N/A 06/12/2012   Procedure: HYSTERECTOMY VAGINAL;  Surgeon: Robley Fries, MD;  Location: [facility];  Service: Gynecology;  Laterality: N/A;   Family History  Problem Relation Age of Onset   Breast cancer Mother 83       with mets    Cancer Mother    Hypertension Mother    Early death Father    Heart disease Father    Kidney disease Father    Colon cancer Paternal Aunt    Cancer Paternal Aunt    Cancer Paternal Uncle    Early death Paternal Uncle    Breast cancer Maternal  Grandmother    Asthma Maternal Grandmother    Cancer Maternal Grandmother    Hypertension Other        Parent   Heart disease Other        parent, grandparent   Esophageal cancer Neg Hx    Rectal cancer Neg Hx    Stomach cancer Neg Hx    Colon polyps Neg Hx    Social History   Socioeconomic History   Marital status: Widowed    Spouse name: Not on file   Number of children: 5   Years of education: 14   Highest education level: Some college, no degree  Occupational History   Occupation: Retired  Tobacco Use   Smoking status: Never    Passive exposure: Past   Smokeless tobacco:  Never  Vaping Use   Vaping status: Never Used  Substance and Sexual Activity   Alcohol use: Never   Drug use: Never   Sexual activity: Not Currently    Birth control/protection: Post-menopausal    Comment: Hysterectomy  Other Topics Concern   Not on file  Social History Narrative   widowed since late 2011 (spouse had mod-adv dementia) 5 grown children, 3 nearby-,many g-kids   Social Drivers of Corporate investment banker Strain: Low Risk  (06/14/2022)   Overall Financial Resource Strain (CARDIA)    Difficulty of Paying Living Expenses: Not hard at all  Food Insecurity: No Food Insecurity (07/16/2022)   Hunger Vital Sign    Worried About Running Out of Food in the Last Year: Never true    Ran Out of Food in the Last Year: Never true  Transportation Needs: No Transportation Needs (07/16/2022)   PRAPARE - Administrator, Civil Service (Medical): No    Lack of Transportation (Non-Medical): No  Physical Activity: Insufficiently Active (06/14/2022)   Exercise Vital Sign    Days of Exercise per Week: 3 days    Minutes of Exercise per Session: 40 min  Stress: No Stress Concern Present (06/14/2022)   [employer] of Occupational Health - Occupational Stress Questionnaire    Feeling of Stress : Not at all  Social Connections: Moderately Isolated (06/14/2022)   Social Connection and  Isolation Panel [NHANES]    Frequency of Communication with Friends and Family: More than three times a week    Frequency of Social Gatherings with Friends and Family: Three times a week    Attends Religious Services: 1 to 4 times per year    Active Member of Clubs or Organizations: No    Attends Banker Meetings: Not on file    Marital Status: Widowed    Tobacco Counseling Counseling given: Not Answered   Clinical Intake:                        Activities of Daily Living    04/17/2023    3:34 PM 07/12/2022    7:00 PM  In your present state of health, do you have any difficulty performing the following activities:  Hearing? 0 0  Vision? 0 0  Difficulty concentrating or making decisions? 0 0  Walking or climbing stairs? 0 0  Dressing or bathing? 0 0  Doing errands, shopping? 0 0  Preparing Food and eating ? N   Using the Toilet? N   In the past six months, have you accidently leaked urine? N   Do you have problems with loss of bowel control? N   Managing your Medications? N   Managing your Finances? N   Housekeeping or managing your Housekeeping? N     Patient Care Team: [name], Buren Kos, MD as PCP - General (Family Medicine) Duke Salvia, MD as PCP - Electrophysiology (Cardiology) Jake Bathe, MD as PCP - Cardiology (Cardiology) Rachael Fee, MD (Inactive) (Gastroenterology) Shea Evans, MD (Obstetrics and Gynecology) Luretha Murphy, MD (General Surgery)  Indicate any recent Medical Services you may have received from other than [facility] providers in the past year (date may be approximate).     Assessment:   This is a routine wellness examination for [facility].  Hearing/Vision screen No results found.   Goals Addressed   None   Depression Screen    12/17/2022    8:44 AM 09/16/2022  9:11 AM 08/28/2022    3:33 PM 08/13/2022    8:21 AM 06/28/2022   10:20 AM 06/03/2022    3:18 PM 03/06/2022    1:34 PM  PHQ 2/9 Scores  PHQ - 2  Score 0 0 0 0 0 0 0  PHQ- 9 Score 0 1 0    0  Exception Documentation   Medical reason Medical reason Medical reason Medical reason Medical reason    Fall Risk    04/17/2023    3:34 PM 12/17/2022    8:44 AM 09/16/2022    9:11 AM 08/28/2022    3:33 PM 08/13/2022    8:21 AM  Fall Risk   Falls in the past year? 1 0 0 0 0  Number falls in past yr: 0 0 0 0 0  Injury with Fall? 0 0 0 0 0  Risk for fall due to :  No Fall Risks No Fall Risks No Fall Risks No Fall Risks  Follow up  Falls evaluation completed Falls evaluation completed Falls evaluation completed Falls evaluation completed    MEDICARE RISK AT HOME: Medicare Risk at Home Any stairs in or around the home?: (Patient-Rptd) No If so, are there any without handrails?: (Patient-Rptd) No Home free of loose throw rugs in walkways, pet beds, electrical cords, etc?: (Patient-Rptd) Yes Adequate lighting in your home to reduce risk of falls?: (Patient-Rptd) Yes Life alert?: (Patient-Rptd) No Use of a cane, walker or w/c?: (Patient-Rptd) No Grab bars in the bathroom?: (Patient-Rptd) No Shower chair or bench in shower?: (Patient-Rptd) No Elevated toilet seat or a handicapped toilet?: (Patient-Rptd) No  TIMED UP AND GO:  Was the test performed?  No    Cognitive Function:        05/20/2019   10:29 AM  6CIT Screen  What Year? 0 points  What month? 0 points  What time? 0 points  Count back from 20 0 points  Months in reverse 0 points  Repeat phrase 0 points  Total Score 0 points    Immunizations Immunization History  Administered Date(s) Administered   Fluad Quad(high Dose 65+) 12/08/2018, 01/12/2020, 01/17/2021   Fluad Trivalent(High Dose 65+) 12/17/2022   Influenza Split 02/07/2012   Influenza Whole 01/23/2009   Influenza, High Dose Seasonal PF 02/26/2018   Influenza, Quadrivalent, Recombinant, Inj, Pf 12/24/2021   Influenza,inj,Quad PF,6+ Mos 03/07/2014, 05/10/2016   PFIZER(Purple Top)SARS-COV-2 Vaccination 05/01/2019,  05/26/2019, 12/21/2019   Pfizer Covid-19 Vaccine Bivalent Booster 38yrs & up 01/01/2021   Pfizer(Comirnaty)Fall Seasonal Vaccine 12 years and older 01/03/2022, 02/10/2023   Pneumococcal Conjugate-13 03/07/2014   Pneumococcal Polysaccharide-23 01/23/2009, 05/10/2016   Respiratory Syncytial Virus Vaccine,Recomb Aduvanted(Arexvy) 12/31/2021   Td 01/23/2009   Tdap 12/24/2021   Zoster Recombinant(Shingrix) 06/22/2021, 09/06/2021    TDAP status: Up to date  Flu Vaccine status: Up to date  Pneumococcal vaccine status: Up to date  Covid-19 vaccine status: Completed vaccines  Qualifies for Shingles Vaccine? Yes   Zostavax completed Yes   Shingrix Completed?: Yes  Screening Tests Health Maintenance  Topic Date Due   Colonoscopy  02/17/2021   COVID-19 Vaccine (7 - 2024-25 season) 04/07/2023   Medicare Annual Wellness (AWV)  04/20/2024   DEXA SCAN  12/31/2024   DTaP/Tdap/Td (3 - Td or Tdap) 12/25/2031   Pneumonia Vaccine 76+ Years old  Completed   INFLUENZA VACCINE  Completed   Hepatitis C Screening  Completed   Zoster Vaccines- Shingrix  Completed   HPV VACCINES  Aged Out    Health Maintenance  Health Maintenance Due  Topic Date Due   Colonoscopy  02/17/2021   COVID-19 Vaccine (7 - 2024-25 season) 04/07/2023    Colorectal cancer screening: Type of screening: Colonoscopy. Completed 02/17/2018. Repeat every .3 years  Mammogram status: Completed 12/10/22. Repeat every year  Bone Density status: Completed 2022. Results reflect: Bone density results: NORMAL. Repeat every 3 years.  Lung Cancer Screening: (Low Dose CT Chest recommended if Age 50-80 years, 20 pack-year currently smoking OR have quit w/in 15years.) does not qualify.   Lung Cancer Screening Referral: NA  Additional Screening:  Hepatitis C Screening: does qualify; Completed   Vision Screening: Recommended annual ophthalmology exams for early detection of glaucoma and other disorders of the eye. Is the patient up  to date with their annual eye exam?  Yes  Who is the provider or what is the name of the office in which the patient attends annual eye exams? [facility] If pt is not established with a provider, would they like to be referred to a provider to establish care? No .   Dental Screening: Recommended annual dental exams for proper oral hygiene   Community Resource Referral / Chronic Care Management: CRR required this visit?  No   CCM required this visit?  No     Plan:     I have personally reviewed and noted the following in the patient's chart:   Medical and social history Use of alcohol, tobacco or illicit drugs  Current medications and supplements including opioid prescriptions. Patient is not currently taking opioid prescriptions. Functional ability and status Nutritional status Physical activity Advanced directives List of other physicians Hospitalizations, surgeries, and ER visits in previous 12 months Vitals Screenings to include cognitive, depression, and falls Referrals and appointments  In addition, I have reviewed and discussed with patient certain preventive protocols, quality metrics, and best practice recommendations. A written personalized care plan for preventive services as well as general preventive health recommendations were provided to patient.     Park Breed, CMA   04/21/2023   After Visit Summary: (MyChart) Due to this being a telephonic visit, the after visit summary with patients personalized plan was offered to patient via MyChart   Nurse Notes:  Ms. Rumberger , Thank you for taking time to come for your Medicare Wellness Visit. I appreciate your ongoing commitment to your health goals. Please review the following plan we discussed and let me know if I can assist you in the future.   These are the goals we discussed:  Goals      Patient Stated     Would like to remain healthy         This is a list of the  screening recommended for you and due dates:  Health Maintenance  Topic Date Due   Colon Cancer Screening  02/17/2021   COVID-19 Vaccine (7 - 2024-25 season) 04/07/2023   Medicare Annual Wellness Visit  04/20/2024   DEXA scan (bone density measurement)  12/31/2024   DTaP/Tdap/Td vaccine (3 - Td or Tdap) 12/25/2031   Pneumonia Vaccine  Completed   Flu Shot  Completed   Hepatitis C Screening  Completed   Zoster (Shingles) Vaccine  Completed   HPV Vaccine  Aged Out

## 2023-04-28 ENCOUNTER — Ambulatory Visit (HOSPITAL_BASED_OUTPATIENT_CLINIC_OR_DEPARTMENT_OTHER): Payer: Medicare Other | Admitting: Family Medicine

## 2023-04-28 ENCOUNTER — Other Ambulatory Visit: Payer: Self-pay | Admitting: Physician Assistant

## 2023-04-28 MED FILL — Rosuvastatin Calcium Tab 5 MG: ORAL | 90 days supply | Qty: 90 | Fill #2 | Status: AC

## 2023-04-29 ENCOUNTER — Other Ambulatory Visit (HOSPITAL_BASED_OUTPATIENT_CLINIC_OR_DEPARTMENT_OTHER): Payer: Self-pay

## 2023-04-29 ENCOUNTER — Other Ambulatory Visit (HOSPITAL_BASED_OUTPATIENT_CLINIC_OR_DEPARTMENT_OTHER): Payer: Self-pay | Admitting: Cardiology

## 2023-04-29 MED FILL — Clopidogrel Bisulfate Tab 75 MG (Base Equiv): ORAL | 30 days supply | Qty: 30 | Fill #0 | Status: CN

## 2023-04-30 ENCOUNTER — Other Ambulatory Visit (HOSPITAL_BASED_OUTPATIENT_CLINIC_OR_DEPARTMENT_OTHER): Payer: Self-pay

## 2023-04-30 MED FILL — Clopidogrel Bisulfate Tab 75 MG (Base Equiv): ORAL | 30 days supply | Qty: 30 | Fill #0 | Status: AC

## 2023-05-13 ENCOUNTER — Ambulatory Visit (INDEPENDENT_AMBULATORY_CARE_PROVIDER_SITE_OTHER): Payer: Medicare Other | Admitting: Family Medicine

## 2023-05-13 ENCOUNTER — Encounter (HOSPITAL_BASED_OUTPATIENT_CLINIC_OR_DEPARTMENT_OTHER): Payer: Self-pay | Admitting: Family Medicine

## 2023-05-13 VITALS — BP 124/64 | HR 85 | Ht 66.0 in | Wt 151.8 lb

## 2023-05-13 DIAGNOSIS — N1832 Chronic kidney disease, stage 3b: Secondary | ICD-10-CM

## 2023-05-13 DIAGNOSIS — D649 Anemia, unspecified: Secondary | ICD-10-CM

## 2023-05-13 NOTE — Progress Notes (Signed)
    Procedures performed today:    None.  Independent interpretation of notes and tests performed by another provider:   None.  Brief History, Exam, Impression, and Recommendations:    BP 124/64 (BP Location: Left Arm, Patient Position: Sitting, Cuff Size: Normal)   Pulse 85   Ht 5\' 6"  (1.676 m)   Wt 151 lb 12.8 oz (68.9 kg)   LMP  (LMP Unknown)   SpO2 100%   BMI 24.50 kg/m   Stage 3b chronic kidney disease (HCC) Assessment & Plan: Most recent BMP showed stable creatinine/EGFR.  Patient does have questions regarding CKD and possible repeat labs for monitoring.  She indicates that she did use Lasix recently due to some lower extremity swelling.  Last took Lasix about 3 days ago. Not currently having any issues with lightheadedness or dizziness or other signs of dehydration.  Vital signs stable in office today. We can plan to check labs today for monitoring  Orders: -     Basic metabolic panel  Anemia, unspecified type Assessment & Plan: Patient reports that she has been having some issues with occasional nosebleeds.  She has also been having upper abdominal pain which reminds her of when she was previously diagnosed with peptic ulcer disease.  She also notes that she has had occasional blood in stool recently.  She was planning to have procedure with GI for EGD and colonoscopy, however she was not able to get approval to come off of Plavix for procedure as her cardiologist wanted her to have at least 1 year of uninterrupted treatment. She does have history of anemia which resulted from GI bleeding.  Due to current symptoms, she is concerned about recurrent anemia.  She has not had any issues with lightheadedness or dizziness, has had mild shortness of breath. On exam, vital signs are stable, patient is in no acute distress. We will check labs today including CBC to assess current status and assess for possible anemia.  She does have upcoming appointment with her cardiologist to  reassess coming off of Plavix to allow for GI procedure to be scheduled.  Orders: -     CBC with Differential/Platelet  Return in about 5 months (around 10/10/2023).  Spent 32 minutes on this patient encounter, including preparation, chart review, face-to-face counseling with patient and coordination of care, and documentation of encounter   ___________________________________________ Sara Giampietro [name], MD, ABFM, [facility] Primary Care and Sports Medicine [facility]

## 2023-05-13 NOTE — Assessment & Plan Note (Signed)
 Patient reports that she has been having some issues with occasional nosebleeds.  She has also been having upper abdominal pain which reminds her of when she was previously diagnosed with peptic ulcer disease.  She also notes that she has had occasional blood in stool recently.  She was planning to have procedure with GI for EGD and colonoscopy, however she was not able to get approval to come off of Plavix for procedure as her cardiologist wanted her to have at least 1 year of uninterrupted treatment. She does have history of anemia which resulted from GI bleeding.  Due to current symptoms, she is concerned about recurrent anemia.  She has not had any issues with lightheadedness or dizziness, has had mild shortness of breath. On exam, vital signs are stable, patient is in no acute distress. We will check labs today including CBC to assess current status and assess for possible anemia.  She does have upcoming appointment with her cardiologist to reassess coming off of Plavix to allow for GI procedure to be scheduled.

## 2023-05-13 NOTE — Assessment & Plan Note (Signed)
 Most recent BMP showed stable creatinine/EGFR.  Patient does have questions regarding CKD and possible repeat labs for monitoring.  She indicates that she did use Lasix recently due to some lower extremity swelling.  Last took Lasix about 3 days ago. Not currently having any issues with lightheadedness or dizziness or other signs of dehydration.  Vital signs stable in office today. We can plan to check labs today for monitoring

## 2023-05-13 NOTE — Patient Instructions (Signed)
  Medication Instructions:  Your physician recommends that you continue on your current medications as directed. Please refer to the Current Medication list given to you today. --If you need a refill on any your medications before your next appointment, please call your pharmacy first. If no refills are authorized on file call the office.-- Lab Work: Your physician has recommended that you have lab work today: today If you have labs (blood work) drawn today and your tests are completely normal, you will receive your results via MyChart message OR a phone call from our staff.  Please ensure you check your voicemail in the event that you authorized detailed messages to be left on a delegated number. If you have any lab test that is abnormal or we need to change your treatment, we will call you to review the results.    Follow-Up: Your next appointment:   Your physician recommends that you schedule a follow-up appointment in: 4-6 month follow up  with Dr. [name]  You will receive a text message or e-mail with a link to a survey about your care and experience with [place] today! We would greatly appreciate your feedback!   Thanks for letting us be apart of your health journey!!  Primary Care and Sports Medicine   Dr. Ceasar Mons Peru   We encourage you to activate your patient portal called "MyChart".  Sign up information is provided on this After Visit Summary.  MyChart is used to connect with patients for Virtual Visits (Telemedicine).  Patients are able to view lab/test results, encounter notes, upcoming appointments, etc.  Non-urgent messages can be sent to your provider as well. To learn more about what you can do with MyChart, please visit --  [email or web address].

## 2023-05-14 LAB — CBC WITH DIFFERENTIAL/PLATELET
Basophils Absolute: 0 10*3/uL (ref 0.0–0.2)
Basos: 1 %
EOS (ABSOLUTE): 0.1 10*3/uL (ref 0.0–0.4)
Eos: 2 %
Hematocrit: 38.6 % (ref 34.0–46.6)
Hemoglobin: 12.7 g/dL (ref 11.1–15.9)
Immature Grans (Abs): 0 10*3/uL (ref 0.0–0.1)
Immature Granulocytes: 0 %
Lymphocytes Absolute: 1.3 10*3/uL (ref 0.7–3.1)
Lymphs: 25 %
MCH: 30.6 pg (ref 26.6–33.0)
MCHC: 32.9 g/dL (ref 31.5–35.7)
MCV: 93 fL (ref 79–97)
Monocytes Absolute: 0.5 10*3/uL (ref 0.1–0.9)
Monocytes: 10 %
Neutrophils Absolute: 3.2 10*3/uL (ref 1.4–7.0)
Neutrophils: 62 %
Platelets: 265 10*3/uL (ref 150–450)
RBC: 4.15 x10E6/uL (ref 3.77–5.28)
RDW: 12.9 % (ref 11.7–15.4)
WBC: 5.1 10*3/uL (ref 3.4–10.8)

## 2023-05-14 LAB — BASIC METABOLIC PANEL
BUN/Creatinine Ratio: 11 — ABNORMAL LOW (ref 12–28)
BUN: 15 mg/dL (ref 8–27)
CO2: 23 mmol/L (ref 20–29)
Calcium: 9.8 mg/dL (ref 8.7–10.3)
Chloride: 103 mmol/L (ref 96–106)
Creatinine, Ser: 1.34 mg/dL — ABNORMAL HIGH (ref 0.57–1.00)
Glucose: 91 mg/dL (ref 70–99)
Potassium: 4.7 mmol/L (ref 3.5–5.2)
Sodium: 142 mmol/L (ref 134–144)
eGFR: 41 mL/min/{1.73_m2} — ABNORMAL LOW (ref >59)

## 2023-05-19 ENCOUNTER — Encounter (HOSPITAL_BASED_OUTPATIENT_CLINIC_OR_DEPARTMENT_OTHER): Payer: Self-pay | Admitting: Family Medicine

## 2023-05-28 MED FILL — Clopidogrel Bisulfate Tab 75 MG (Base Equiv): ORAL | 30 days supply | Qty: 30 | Fill #1 | Status: AC

## 2023-06-02 ENCOUNTER — Ambulatory Visit: Payer: Medicare Other | Attending: Physician Assistant | Admitting: Physician Assistant

## 2023-06-02 ENCOUNTER — Encounter: Payer: Self-pay | Admitting: Physician Assistant

## 2023-06-02 ENCOUNTER — Other Ambulatory Visit (HOSPITAL_BASED_OUTPATIENT_CLINIC_OR_DEPARTMENT_OTHER): Payer: Self-pay

## 2023-06-02 VITALS — BP 112/68 | HR 81 | Ht 66.0 in | Wt 148.4 lb

## 2023-06-02 DIAGNOSIS — E876 Hypokalemia: Secondary | ICD-10-CM | POA: Diagnosis not present

## 2023-06-02 DIAGNOSIS — I700 Atherosclerosis of aorta: Secondary | ICD-10-CM

## 2023-06-02 DIAGNOSIS — Z01818 Encounter for other preprocedural examination: Secondary | ICD-10-CM | POA: Diagnosis not present

## 2023-06-02 DIAGNOSIS — E785 Hyperlipidemia, unspecified: Secondary | ICD-10-CM | POA: Diagnosis not present

## 2023-06-02 DIAGNOSIS — R0609 Other forms of dyspnea: Secondary | ICD-10-CM

## 2023-06-02 DIAGNOSIS — I2510 Atherosclerotic heart disease of native coronary artery without angina pectoris: Secondary | ICD-10-CM

## 2023-06-02 DIAGNOSIS — I10 Essential (primary) hypertension: Secondary | ICD-10-CM

## 2023-06-02 MED ORDER — NITROGLYCERIN 0.4 MG SL SUBL
0.4000 mg | SUBLINGUAL_TABLET | SUBLINGUAL | 3 refills | Status: AC | PRN
  Filled 2023-06-02: qty 25, 1d supply, fill #0
  Filled 2023-06-04: qty 25, 8d supply, fill #0

## 2023-06-02 MED ORDER — CLOPIDOGREL BISULFATE 75 MG PO TABS
75.0000 mg | ORAL_TABLET | Freq: Every day | ORAL | 3 refills | Status: AC
  Filled 2023-06-02 – 2023-06-22 (×2): qty 90, 90d supply, fill #0
  Filled 2023-09-20: qty 90, 90d supply, fill #1
  Filled 2023-12-18: qty 90, 90d supply, fill #2
  Filled 2024-03-15: qty 90, 90d supply, fill #3

## 2023-06-02 NOTE — Progress Notes (Signed)
 Office Visit    Patient Name: Sara [name] Date of Encounter: 06/02/2023  PCP:  [name], Raymond J, MD   [facility]  Cardiologist:  Donato Schultz, MD  Advanced Practice Provider:  No care team member to display Electrophysiologist:  Sherryl Manges, MD   Chief Complaint    Sara [name] is a 78 y.o. female with a past medical history of CAD status post PCI with DES to proximal/mid RCA, SVT/atrial ectopy, LVH (not meeting criteria for HCM), hypertension, hyperlipidemia, CKD 3a presents today for follow-up appointment.  The patient was last seen by Dr. Graciela Husbands 09/18/2020 for palpitations and chest discomfort at the request of Dr. Daleen Squibb.  Patient wore a cardiac monitor showing SVT.  Patient was started on verapamil and her echo showed moderate asymmetric LVH on the basal/septal segment.  Nuclear stress test showed prior inferior infarct and minimal peri-infarct ischemia.  Cardiac MRI to evaluate for HCM showed asymmetric LVH but did not meet criteria for HCM.  At the patient's visit with Dr. Graciela Husbands 01/15/2022 there was a question of HFpEF given complaints of DOE with associated edema.  Was started on low-dose diuretics.  Patient was reevaluated by Ronie Spies, PA-C on 02/27/2022 for preoperative cardiac risk assessment pending hip arthroplasty.  At that time, she was complaining of a 58-month history of DOE that was slightly better with the addition of Lasix.  Labs were normal.  Echo was unchanged from 2022.  She underwent cardiac catheterization PET/CT at the suggestion of Dr. Graciela Husbands which showed evidence of ischemia.  Underwent [facility] 04/29/2022 and received PCI with DES to proximal/mid RCA.  Patient was last seen 05/13/2022 and at that time she reported her back pain had improved since her catheterization.  Still becomes dyspneic with longer distances but felt this was slowly improving.  Stated that walking into the office did not cause shortness of breath and this was an  issue previously.  Hypertensive in the office 162/84 and then 138/78 recheck.  She denied headaches, dizziness, lightheadedness or vision changes.  No chest pain or shortness of breath.  No lower extremity edema.  She does have as needed Lasix to use since she had previous lower extremity edema but had not needed it recently.  She was seen by me 4/1, she tells me that she has had a bit of a complicated course following her stent placement. She had some pains under he left breast and also a positive stool sample for blood. Her WBCs were elevated and she was started on an antibiotic. Most recent WBC count is normal and pain subsided. She does continue to have issues with her blood pressure being elevated. She was offered to increase her verapamil or add a new medication. She feels like her palpitations are well controlled so she opted to add a new medication.    I saw her 12/27/22. Having some issues with SOB and her feet swelling. Hiatal hernia could be contributing to her SOB.  She does have some swelling.  Has not been taking her Lasix.  Creatinine 1.8 at 1 Point and Likely Is Compounded 1.4 Which Is Her Baseline.  She Has Been Avoiding Salt.  We Discussed Sticking with Lower Extremity Stockings/Socks Whenever Able.  She Is Elevating Her Feet As Well.  Otherwise, No Chest Pain.She is on 1 Antiplatelet Agent Plavix).  She Also Tells Me That eventually she will need her hip replaced and I explained that typically we will get a preop clearance request  for this type of surgery and we can plan on holding her Plavix at that time   Reports no chest pain, pressure, or tightness. No orthopnea, PND. Reports no palpitations.   The patient, with a history of PCI 04/2022, is currently on Plavix alone. She reports experiencing shortness of breath, which has not worsened recently. She also mentions feeling dizzy upon waking up, but this is not a frequent occurrence. The patient is scheduled for a colonoscopy and endoscopy,  which had been rescheduled twice due to COVID and bronchitis. She also mentions a potential need for hip surgery in the future, but is currently managing the discomfort. The patient's blood pressure was initially low at 102 systolic , but increased to 112 over 68. She has been taking her medications with Plavix and has not noticed any adverse effects.  Reports no shortness of breath nor dyspnea on exertion. Reports no chest pain, pressure, or tightness. No edema, orthopnea, PND. Reports no palpitations.   Discussed the use of AI scribe software for clinical note transcription with the patient, who gave verbal consent to proceed.  Plavix x 5 day before, resume when its safe to do so. Might need biopsies.  No date right now and we do not have a cardiac clearance ye.  Past Medical History    Past Medical History:  Diagnosis Date   Anemia 08/13/2022   Anxiety 07/2020   Aortic dilatation (HCC) 03/25/2022   38 mm noted on echo   Arthritis 11/2017   Barrett's esophagus    CAD (coronary artery disease)    Cataract    had surgery   Chronic kidney disease, stage 3a (HCC)    GERD (gastroesophageal reflux disease)    hx of    HIATAL HERNIA    s/p surgical repair   Hyperlipidemia    HYPERTENSION    Kidney stone    1990s   Known medical problems 07/12/2022   LVH (left ventricular hypertrophy)    Osteoporosis    PAT (paroxysmal atrial tachycardia) (HCC)    Premature atrial contractions    Past Surgical History:  Procedure Laterality Date   ABDOMINAL HYSTERECTOMY  06/12/2012   CATARACT EXTRACTION  2009   both eyes   CHOLECYSTECTOMY  11/02/2012   Procedure: CHOLECYSTECTOMY;  Surgeon: Valarie Merino, MD;  Location: [facility]S;  Service: General;;   COLOSTOMY     CORONARY STENT INTERVENTION N/A 04/29/2022   Procedure: CORONARY STENT INTERVENTION;  Surgeon: Yvonne Kendall, MD;  Location: [facility] INVASIVE CV LAB;  Service: Cardiovascular;  Laterality: N/A;   CORONARY ULTRASOUND/IVUS N/A 04/29/2022    Procedure: Intravascular Ultrasound/IVUS;  Surgeon: Yvonne Kendall, MD;  Location: [facility] INVASIVE CV LAB;  Service: Cardiovascular;  Laterality: N/A;   CYSTOCELE REPAIR N/A 06/12/2012   Procedure: ANTERIOR REPAIR (CYSTOCELE);  Surgeon: Robley Fries, MD;  Location: [facility] ORS;  Service: Gynecology;  Laterality: N/A;   ESOPHAGOGASTRODUODENOSCOPY (EGD) WITH PROPOFOL N/A 07/13/2022   Procedure: ESOPHAGOGASTRODUODENOSCOPY (EGD) WITH PROPOFOL;  Surgeon: Jeani Hawking, MD;  Location: [facility] ENDOSCOPY;  Service: Gastroenterology;  Laterality: N/A;   EYE SURGERY  Cataract removal 2009   HERNIA REPAIR  2011   HIATAL HERNIA REPAIR  2011   LITHOTRIPSY     RIGHT/LEFT HEART CATH AND CORONARY ANGIOGRAPHY N/A 04/29/2022   Procedure: RIGHT/LEFT HEART CATH AND CORONARY ANGIOGRAPHY;  Surgeon: Yvonne Kendall, MD;  Location: [facility] CV [facility];  Service: Cardiovascular;  Laterality: N/A;   TONSILLECTOMY  1968   UPPER GASTROINTESTINAL ENDOSCOPY     UPPER  GI ENDOSCOPY  11/02/2012   Procedure: UPPER GI ENDOSCOPY;  Surgeon: Valarie Merino, MD;  Location: [facility];  Service: General;;   VAGINAL HYSTERECTOMY N/A 06/12/2012   Procedure: HYSTERECTOMY VAGINAL;  Surgeon: Robley Fries, MD;  Location: [facility];  Service: Gynecology;  Laterality: N/A;    Allergies  Allergies  Allergen Reactions   Augmentin [Amoxicillin-Pot Clavulanate] Diarrhea   Metoprolol     Had prior to cor CT, felt very dizzy   Nsaids     Avoid due to diminished kidney function     EKGs/Labs/Other Studies Reviewed:   The following studies were reviewed today:  Coronary CT 05/29/2021: Calcium score 929 (95th percentile).  Significant artifact obscuring mid to distal vessels.  Likely no severe stenosis in mid to distal LAD and LCx.  Not sent for FFR. Echo 03/27/2022: EF 60 to 65%.  Mild asymmetric LVH basal-septal segment.  Trivial MR.  Aortic valve sclerosis/calcification present without stenosis.  Borderline dilatation of ascending aorta 38  mm. Cardiac PET/CT 04/16/2022: LV perfusion is abnormal with evidence of ischemia.  Reversible medium defect with moderate reduction in uptake apical to basal inferior and inferoseptal.  Severe coronary calcifications present LAD, LCx, and RCA.  Severely abnormal blood flow reserve RCA distribution. R/LHC 04/29/2022: Nonobstructive CAD LM, LAD, LCx.  Proximal/mid RCA 99%.  PCI with DES 3.0 x 32 mm to proximal/mid RCA.  Normal left and right heart filling pressures, normal Fick cardiac output/index.  Cardiac event monitor 08/15/2020: Predominant underlying rhythm was sinus rhythm.  417 supraventricular tachycardia runs fastest lasting 1 minute 51 seconds max rate 266 bpm, longest 2 minutes 1 second with average rate 216 bpm.  Rare PACs and PVCs.  No atrial fibrillation.  Patient was symptomatic during SVT episodes.  Echo 10/10/2020: EF 55 to 60%.  Moderate asymmetric LVH of the basal-septal segment.  Trivial MR.  Mild aortic valve sclerosis without stenosis.  Mild dilatation of ascending aorta 38 mm. Cardiac MRI 10/31/2020: Asymmetric LVH measuring 12 mm and basal septum, does not meet criteria for HCM.  No suggestion of myocardial scar.  LV/RV size and function normal.  Large hiatal hernia. Repeat echo 03/27/2022 outlined above (see CAD).  EKG:  EKG is not ordered today.   Recent Labs: 07/15/2022: Magnesium 1.8 01/03/2023: ALT 25; NT-Pro BNP 201 05/13/2023: BUN 15; Creatinine, Ser 1.34; Hemoglobin 12.7; Platelets 265; Potassium 4.7; Sodium 142  Recent Lipid Panel    Component Value Date/Time   CHOL 109 01/03/2023 0737   TRIG 104 01/03/2023 0737   HDL 45 01/03/2023 0737   CHOLHDL 2.4 01/03/2023 0737   CHOLHDL 2 05/31/2020 0841   VLDL 19.6 05/31/2020 0841   LDLCALC 45 01/03/2023 0737   Home Medications   Current Meds  Medication Sig   acetaminophen (TYLENOL) 325 MG tablet Take 2 tablets (650 mg total) by mouth every 6 (six) hours as needed for mild pain (or Fever >/= 101).   clopidogrel (PLAVIX) 75  MG tablet Take 1 tablet (75 mg total) by mouth daily.   cyanocobalamin (VITAMIN B12) 1000 MCG tablet Take 1,000 mcg by mouth daily.   Ferrous Sulfate Dried (HIGH POTENCY IRON) 65 MG TABS    furosemide (LASIX) 20 MG tablet Take 1 tablet (20 mg total) by mouth daily as needed for edema.   losartan (COZAAR) 25 MG tablet Take 1 tablet (25 mg total) by mouth daily.   Multiple Vitamin (MULTIVITAMIN WITH MINERALS) TABS Take 1 tablet by mouth daily.   nitroGLYCERIN (NITROSTAT) 0.4 MG  SL tablet Place 1 tablet (0.4 mg total) under the tongue every 5 (five) minutes as needed for chest pain.   pantoprazole (PROTONIX) 40 MG tablet Take 1 tablet (40 mg total) by mouth 2 (two) times daily.   potassium chloride SA (KLOR-CON M) 20 MEQ tablet Take 1 tablet (20 mEq total) by mouth 2 (two) times daily for 3 days, THEN 1 tablet (20 mEq total) daily.   Probiotic Product (PROBIOTIC DAILY PO) Take 1 capsule by mouth daily.   rosuvastatin (CRESTOR) 5 MG tablet Take 1 tablet (5 mg total) by mouth daily.   verapamil (CALAN-SR) 180 MG CR tablet Take 1 tablet (180 mg total) by mouth daily.     Review of Systems      All other systems reviewed and are otherwise negative except as noted above.  Physical Exam    VS:  BP 112/68   Pulse 81   Ht 5\' 6"  (1.676 m)   Wt 148 lb 6.4 oz (67.3 kg)   LMP  (LMP Unknown)   SpO2 98%   BMI 23.95 kg/m  , BMI Body mass index is 23.95 kg/m.  Wt Readings from Last 3 Encounters:  06/02/23 148 lb 6.4 oz (67.3 kg)  05/13/23 151 lb 12.8 oz (68.9 kg)  04/03/23 152 lb (68.9 kg)     GEN: Well nourished, well developed, in no acute distress. HEENT: normal. Neck: Supple, no JVD, carotid bruits, or masses. Cardiac: RRR, no murmurs, rubs, or gallops. No clubbing, cyanosis,  bilateral 1-2 +edema.  Radials/PT 2+ and equal bilaterally.  Respiratory:  Respirations regular and unlabored, clear to auscultation bilaterally. GI: Soft, nontender, nondistended. MS: No deformity or atrophy. Skin:  Warm and dry, no rash. Neuro:  Strength and sensation are intact. Psych: Normal affect.  Assessment & Plan    Coronary Artery Disease On dual antiplatelet therapy (Plavix) since stent placement in February of the previous year. No current chest pain or shortness of breath. -Continue current medication regimen.  Colonoscopy Clearance Patient requires colonoscopy and endoscopy. It has been over a year since stent placement and initiation of Plavix. -Clear for colonoscopy and endoscopy (will await clearance fax number and request) -Hold Plavix for 5 days prior to procedure and resume when safe.  Hiatal Hernia Possible contribution to shortness of breath. -No specific plan discussed in the conversation.  Hypokalemia Previous low potassium levels. Recent labs show potassium within normal range. -Continue current management.  Hip Pain Patient has been managing hip pain conservatively. No current plans for hip replacement surgery. -Continue current management.  Follow-up -Schedule follow-up appointment in September. -Check labs prior to colonoscopy and endoscopy.    Disposition: Follow up 6 months with Donato Schultz, MD or APP.  Signed, Sharlene Dory, PA-C 06/02/2023, 3:50 PM [facility]

## 2023-06-02 NOTE — Patient Instructions (Signed)
 Medication Instructions:  Hold Plavix for 5 days prior to colonoscopy, resume when safe to do so *If you need a refill on your cardiac medications before your next appointment, please call your pharmacy*  Follow-Up: At [facility], you and your health needs are our priority.  As part of our continuing mission to provide you with exceptional heart care, we have created designated Provider Care Teams.  These Care Teams include your primary Cardiologist (physician) and Advanced Practice Providers (APPs -  Physician Assistants and Nurse Practitioners) who all work together to provide you with the care you need, when you need it.  We recommend signing up for the patient portal called "MyChart".  Sign up information is provided on this After Visit Summary.  MyChart is used to connect with patients for Virtual Visits (Telemedicine).  Patients are able to view lab/test results, encounter notes, upcoming appointments, etc.  Non-urgent messages can be sent to your provider as well.   To learn more about what you can do with MyChart, go to [email or web address].    Your next appointment:   6 month(s)  Provider:   Donato Schultz, MD     Other Instructions   1st Floor: - Lobby - Registration  - Pharmacy  - Lab - Cafe  2nd Floor: - PV Lab - Diagnostic Testing (echo, CT, nuclear med)  3rd Floor: - Vacant  4th Floor: - TCTS (cardiothoracic surgery) - AFib Clinic - Structural Heart Clinic - Vascular Surgery  - Vascular Ultrasound  5th Floor: - HeartCare Cardiology (general and EP) - Clinical Pharmacy for coumadin, hypertension, lipid, weight-loss medications, and med management appointments    Valet parking services will be available as well.

## 2023-06-03 ENCOUNTER — Telehealth: Payer: Self-pay | Admitting: Gastroenterology

## 2023-06-03 NOTE — Telephone Encounter (Signed)
 Pt stated that she recently seen her Cardiologist for cardiac clearance. Pt is on Plavix.  Pt was scheduled for an office visit on 06/25/2023 at 8:50 AM to see Dr. Chales Abrahams. Pt made aware.  Pt verbalized understanding with all questions answered.

## 2023-06-03 NOTE — Telephone Encounter (Signed)
 Inbound call from patient stating that she was seen with cardiologist and was advised that she was cleared to proceed with colonoscopy. Patient is requesting a call to discuss if we have received her clearance and if she can go ahead and schedule. Please advise.

## 2023-06-03 NOTE — Telephone Encounter (Signed)
 Left message for pt to call back

## 2023-06-04 ENCOUNTER — Other Ambulatory Visit (HOSPITAL_BASED_OUTPATIENT_CLINIC_OR_DEPARTMENT_OTHER): Payer: Self-pay

## 2023-06-08 MED FILL — Losartan Potassium Tab 25 MG: ORAL | 90 days supply | Qty: 90 | Fill #2 | Status: AC

## 2023-06-09 ENCOUNTER — Ambulatory Visit (HOSPITAL_BASED_OUTPATIENT_CLINIC_OR_DEPARTMENT_OTHER): Admitting: Family Medicine

## 2023-06-09 ENCOUNTER — Other Ambulatory Visit (HOSPITAL_BASED_OUTPATIENT_CLINIC_OR_DEPARTMENT_OTHER): Payer: Self-pay

## 2023-06-09 ENCOUNTER — Encounter (HOSPITAL_BASED_OUTPATIENT_CLINIC_OR_DEPARTMENT_OTHER): Payer: Self-pay | Admitting: Family Medicine

## 2023-06-09 VITALS — BP 120/68 | HR 84 | Ht 61.0 in | Wt 149.1 lb

## 2023-06-09 DIAGNOSIS — M545 Low back pain, unspecified: Secondary | ICD-10-CM

## 2023-06-09 DIAGNOSIS — R8290 Unspecified abnormal findings in urine: Secondary | ICD-10-CM | POA: Diagnosis not present

## 2023-06-09 DIAGNOSIS — N390 Urinary tract infection, site not specified: Secondary | ICD-10-CM | POA: Diagnosis not present

## 2023-06-09 LAB — POCT URINALYSIS DIP (CLINITEK)
Bilirubin, UA: NEGATIVE
Blood, UA: NEGATIVE
Glucose, UA: NEGATIVE mg/dL
Ketones, POC UA: NEGATIVE mg/dL
Nitrite, UA: NEGATIVE
POC PROTEIN,UA: NEGATIVE
Spec Grav, UA: 1.01 (ref 1.010–1.025)
Urobilinogen, UA: 0.2 U/dL
pH, UA: 5 (ref 5.0–8.0)

## 2023-06-09 MED ORDER — CIPROFLOXACIN HCL 500 MG PO TABS
500.0000 mg | ORAL_TABLET | Freq: Two times a day (BID) | ORAL | 0 refills | Status: AC
  Filled 2023-06-09: qty 14, 7d supply, fill #0

## 2023-06-09 NOTE — Assessment & Plan Note (Addendum)
 A few days ago, patient began to note left-sided back/side pain.  Pain reminded her of prior urinary tract infection.  She has not had any fever, chills, sweats.  No lightheadedness or dizziness.  No abdominal pain.  She has noted darker urine color, slight odor.  However denies any significant pain or burning with urination.  No blood noted in urine.  Notes issue with Augmentin in the past, tolerated other antibiotics without issue. On exam, patient is in no acute distress, vital signs stable.  Cardiovascular exam with regular rate and rhythm.  Lungs clear to auscultation bilaterally.  No significant CVA tenderness.  Abdomen with normal bowel sounds, soft, nontender. Urine testing with positive leukocytes, negative nitrates.  Given symptoms, can proceed initially with antibiotic therapy.  We will also send urine for culture and treat accordingly.  Discussed precautions with patient, particularly related to any worsening of symptoms, development of fevers, persistent nausea or vomiting

## 2023-06-09 NOTE — Progress Notes (Signed)
    Procedures performed today:    None.  Independent interpretation of notes and tests performed by another provider:   None.  Brief History, Exam, Impression, and Recommendations:    BP 120/68 (BP Location: Left Arm, Patient Position: Sitting, Cuff Size: Normal)   Pulse 84   Ht 5\' 1"  (1.549 m)   Wt 149 lb 1.6 oz (67.6 kg)   LMP  (LMP Unknown)   SpO2 100%   BMI 28.17 kg/m   Malodorous urine Assessment & Plan: A few days ago, patient began to note left-sided back/side pain.  Pain reminded her of prior urinary tract infection.  She has not had any fever, chills, sweats.  No lightheadedness or dizziness.  No abdominal pain.  She has noted darker urine color, slight odor.  However denies any significant pain or burning with urination.  No blood noted in urine.  Notes issue with Augmentin in the past, tolerated other antibiotics without issue. On exam, patient is in no acute distress, vital signs stable.  Cardiovascular exam with regular rate and rhythm.  Lungs clear to auscultation bilaterally.  No significant CVA tenderness.  Abdomen with normal bowel sounds, soft, nontender. Urine testing with positive leukocytes, negative nitrates.  Given symptoms, can proceed initially with antibiotic therapy.  We will also send urine for culture and treat accordingly.  Discussed precautions with patient, particularly related to any worsening of symptoms, development of fevers, persistent nausea or vomiting  Orders: -     POCT URINALYSIS DIP (CLINITEK) -     Urine Culture  Acute low back pain, unspecified back pain laterality, unspecified whether sciatica present -     POCT URINALYSIS DIP (CLINITEK) -     Urine Culture  Other orders -     Ciprofloxacin HCl; Take 1 tablet (500 mg total) by mouth 2 (two) times daily for 7 days.  Dispense: 14 tablet; Refill: 0  Return if symptoms worsen or fail to improve.   ___________________________________________ Keilani Terrance [name], MD, ABFM, CAQSM Primary  Care and Sports Medicine [facility]

## 2023-06-11 ENCOUNTER — Other Ambulatory Visit: Payer: Self-pay

## 2023-06-11 LAB — URINE CULTURE

## 2023-06-23 ENCOUNTER — Other Ambulatory Visit (HOSPITAL_BASED_OUTPATIENT_CLINIC_OR_DEPARTMENT_OTHER): Payer: Self-pay

## 2023-06-25 ENCOUNTER — Ambulatory Visit: Admitting: Gastroenterology

## 2023-06-25 ENCOUNTER — Encounter: Payer: Self-pay | Admitting: Gastroenterology

## 2023-06-25 ENCOUNTER — Telehealth: Payer: Self-pay

## 2023-06-25 ENCOUNTER — Other Ambulatory Visit (INDEPENDENT_AMBULATORY_CARE_PROVIDER_SITE_OTHER)

## 2023-06-25 VITALS — BP 128/80 | HR 88 | Ht 64.0 in | Wt 147.5 lb

## 2023-06-25 DIAGNOSIS — Z8719 Personal history of other diseases of the digestive system: Secondary | ICD-10-CM | POA: Diagnosis not present

## 2023-06-25 DIAGNOSIS — D509 Iron deficiency anemia, unspecified: Secondary | ICD-10-CM

## 2023-06-25 DIAGNOSIS — Z7902 Long term (current) use of antithrombotics/antiplatelets: Secondary | ICD-10-CM

## 2023-06-25 DIAGNOSIS — K449 Diaphragmatic hernia without obstruction or gangrene: Secondary | ICD-10-CM

## 2023-06-25 DIAGNOSIS — I2510 Atherosclerotic heart disease of native coronary artery without angina pectoris: Secondary | ICD-10-CM

## 2023-06-25 DIAGNOSIS — Z860100 Personal history of colon polyps, unspecified: Secondary | ICD-10-CM

## 2023-06-25 LAB — COMPREHENSIVE METABOLIC PANEL WITH GFR
ALT: 10 U/L (ref 0–35)
AST: 14 U/L (ref 0–37)
Albumin: 4.2 g/dL (ref 3.5–5.2)
Alkaline Phosphatase: 70 U/L (ref 39–117)
BUN: 18 mg/dL (ref 6–23)
CO2: 26 meq/L (ref 19–32)
Calcium: 9.7 mg/dL (ref 8.4–10.5)
Chloride: 105 meq/L (ref 96–112)
Creatinine, Ser: 1.31 mg/dL — ABNORMAL HIGH (ref 0.40–1.20)
GFR: 39.74 mL/min — ABNORMAL LOW (ref >60.00)
Glucose, Bld: 96 mg/dL (ref 70–99)
Potassium: 4.4 meq/L (ref 3.5–5.1)
Sodium: 140 meq/L (ref 135–145)
Total Bilirubin: 0.7 mg/dL (ref 0.2–1.2)
Total Protein: 6.8 g/dL (ref 6.0–8.3)

## 2023-06-25 LAB — CBC WITH DIFFERENTIAL/PLATELET
Basophils Absolute: 0 10*3/uL (ref 0.0–0.1)
Basophils Relative: 0.4 % (ref 0.0–3.0)
Eosinophils Absolute: 0.3 10*3/uL (ref 0.0–0.7)
Eosinophils Relative: 3.4 % (ref 0.0–5.0)
HCT: 40.3 % (ref 36.0–46.0)
Hemoglobin: 13.4 g/dL (ref 12.0–15.0)
Lymphocytes Relative: 25.8 % (ref 12.0–46.0)
Lymphs Abs: 2.2 10*3/uL (ref 0.7–4.0)
MCHC: 33.3 g/dL (ref 30.0–36.0)
MCV: 93.1 fl (ref 78.0–100.0)
Monocytes Absolute: 0.6 10*3/uL (ref 0.1–1.0)
Monocytes Relative: 7.7 % (ref 3.0–12.0)
Neutro Abs: 5.2 10*3/uL (ref 1.4–7.7)
Neutrophils Relative %: 62.7 % (ref 43.0–77.0)
Platelets: 271 10*3/uL (ref 150.0–400.0)
RBC: 4.33 Mil/uL (ref 3.87–5.11)
RDW: 14.2 % (ref 11.5–15.5)
WBC: 8.3 10*3/uL (ref 4.0–10.5)

## 2023-06-25 NOTE — Telephone Encounter (Addendum)
 Patient made aware that she can hold her Plavix 5 days prior to the procedure and voiced understanding per note 06-02-23

## 2023-06-25 NOTE — Telephone Encounter (Signed)
 Faxed clearance note from 06/02/2023 office visit with [place], [place].

## 2023-06-25 NOTE — Progress Notes (Signed)
 Chief Complaint: FU  Referring Provider:  [name], Raymond J, MD      ASSESSMENT AND PLAN;   #1. IDA d/t large HH (resolved with PO iron)  #2. H/O Polyps (colon 2019)  #3. H/O UGI bleed (resolved) d/t 10mm mid esophageal ulcer on EGD 07/13/2022  #4. Large 5 cm HH (seen Dr Daphine Deutscher- avoid rpt repair) with Barrett's esophagus.  No masses or nodularity and Barrett's esophagus.  Failed fundoplication 2011  #5. CAD s/p recent DES 04/29/2022 (on plavix, off ASA)  Plan: -Continue protonix 40 BID -CBC, CMP today -Continue Iron 1 tab po qd (may turn stool dark) -Miralax 17g po every day prn (for constipation) -EGD/colon on miralax off Plavix x 5 days. Cleared by cardio clearence   I discussed EGD/Colonoscopy- the indications, risks, alternatives and potential complications including, but not limited to bleeding, infection, reaction to meds, damage to internal organs, cardiac and/or pulmonary problems, and perforation requiring surgery. The possibility that significant findings could be missed was explained. All ? were answered. Pt consents to proceed.  HPI:    Sara [name] is a 78 y.o. female  Patient of Dr. Christella Hartigan With hypertension, hyperlipidemia, CKD stage III, CAD status post DES stent to mid RCA on 04/29/22 on (bASA/clopidogrel), hiatal hernia status post failed fundoplication 2011, gastritis, esophagitis, Barrett's esophagus  FU Much better Hb 12.9, MCV 93  Recent UTI Has CKD3 Cr 1.34 2/18  Seen by cardiology.  Taken off baby aspirin but need to continue Plavix.  She is scheduled for EGD/colonoscopy. OK to proceed.  Discussed the use of AI scribe software for clinical note transcription with the patient, who gave verbal consent to proceed.  History of Present Illness Sara [name] is a 78 year old female with anemia and gastrointestinal issues who presents for scheduling of endoscopy and colonoscopy.   She has been cleared by cardiology for the procedures and  has been off aspirin as per her cardiologist's instructions. She is currently taking Protonix twice a day.  Her anemia is improving, and she feels she is regaining strength with no recent dizzy spells. Her hemoglobin level was 12.7 in February, and she continues to take iron tablets.  She experiences occasional discomfort in the area of a previous ulcer but denies any recent bleeding. She uses Miralax as needed for constipation, which she describes as intermittent.  She recently had a urinary tract infection, which she describes as mild.     From prev notes: Adm with hematemesis. Hb 14 (05/2022) to 6.9 s/p 1U to 8.4.  EGD 07/13/2022 identified a 5 cm hiatal hernia, and esophageal ulcer without stigmata of recent bleeding and evidence of Barrett's esophagus. Stomach and duodenum were normal. On PPI bid. Plavix restarted post EGD.  Started iron 65mg  po QD Got constipated- taking miralax Hb 9.4. Stopped Carafate.  Does feel somewhat better.  Stool has cleared.  No nausea, vomiting, hematemesis, dysphagia or odynophagia.  No fever or chills.  Has history of chronic constipation for which she uses MiraLAX.  We have gone over recent EGD, CT Abdo/pelvis, labs etc.  She is under close cardiology follow-up as well.  It has been recommended to continue baby aspirin/Plavix for now.  She has seen Dr. Daphine Deutscher for surgical repair of hiatal hernia last year-recommended conservative management.     Latest Ref Rng & Units 05/13/2023   10:42 AM 11/04/2022    9:25 AM 09/03/2022    8:03 AM  CBC  WBC 3.4 - 10.8 x10E3/uL  5.1  7.0  6.7   Hemoglobin 11.1 - 15.9 g/dL 65.7  84.6  96.2   Hematocrit 34.0 - 46.6 % 38.6  36.8  35.8   Platelets 150 - 450 x10E3/uL 265  263.0  302.0      Past GI workup: EGD by Dr. Elnoria Howard 07/13/2022: - Esophageal ulcer with no stigmata of recent bleeding.  - 5 cm hiatal hernia.  - Gastroesophageal flap valve classified as Hill Grade IV (no fold, wide open lumen, hiatal hernia  present).  - Esophageal mucosal changes classified as Barrett's stage C13-M15 per Prague criteria.  - Normal stomach.  - Normal examined duodenum.  - No specimens collected.  CTA 07/12/2022 chest Large hiatal hernia. There is a dilated fluid-filled esophagus. Submucosal hyperenhancement as well along the lower esophagus. Further workup when clinically appropriate. Significant breathing motion significantly limiting evaluation for pulmonary emboli. No large or central embolus.  UGI series 07/2021 1. Small to moderate recurrent hiatal hernia. 2. Severe spontaneous gastroesophageal reflux, noting undigested food debris throughout the refluxed material in the esophagus. 3. Moderate esophageal dysmotility, compatible with chronic reflux related dysmotility. 4. Small periampullary duodenal diverticulum. Otherwise normal upper GI. No masses or strictures.  01/2018 colonoscopy Dr. Christella Hartigan 15 mm polyp cecum 3 mm polyp descending colon diverticula  12/10/2019 endoscopy for indigestion, remote fundoplication for large HH, Barrett's sowed significantly anatomical abnormal proximal stomach metaplasia is intact however the diaphragm is a medium size hiatal hernia free reflux of gastric secretion in the esophagus no retained solid or liquid food in the stomach nonspecific mild distal gastritis esophagus torturous GE junction single erosion, Barrett's appearing mucosa up to 25 cm mild reflux related esophagitis Pathology showed Barrett's changes without dysplasia recall 3 years.    Past Medical History:  Diagnosis Date   Anemia 08/13/2022   Anxiety 07/2020   Aortic dilatation (HCC) 03/25/2022   38 mm noted on echo   Arthritis 11/2017   Barrett's esophagus    CAD (coronary artery disease)    Cataract    had surgery   Chronic kidney disease, stage 3a (HCC)    GERD (gastroesophageal reflux disease)    hx of    HIATAL HERNIA    s/p surgical repair   Hyperlipidemia    HYPERTENSION    Kidney  stone    1990s   Known medical problems 07/12/2022   LVH (left ventricular hypertrophy)    Osteoporosis    PAT (paroxysmal atrial tachycardia) (HCC)    Premature atrial contractions     Past Surgical History:  Procedure Laterality Date   ABDOMINAL HYSTERECTOMY  06/12/2012   CATARACT EXTRACTION  2009   both eyes   CHOLECYSTECTOMY  11/02/2012   Procedure: CHOLECYSTECTOMY;  Surgeon: Valarie Merino, MD;  Location: [facility];  Service: General;;   COLOSTOMY     CORONARY STENT INTERVENTION N/A 04/29/2022   Procedure: CORONARY STENT INTERVENTION;  Surgeon: Yvonne Kendall, MD;  Location: [facility];  Service: Cardiovascular;  Laterality: N/A;   CORONARY ULTRASOUND/IVUS N/A 04/29/2022   Procedure: Intravascular Ultrasound/IVUS;  Surgeon: Yvonne Kendall, MD;  Location: [facility];  Service: Cardiovascular;  Laterality: N/A;   CYSTOCELE REPAIR N/A 06/12/2012   Procedure: ANTERIOR REPAIR (CYSTOCELE);  Surgeon: Robley Fries, MD;  Location: [facility];  Service: Gynecology;  Laterality: N/A;   ESOPHAGOGASTRODUODENOSCOPY (EGD) WITH PROPOFOL N/A 07/13/2022   Procedure: ESOPHAGOGASTRODUODENOSCOPY (EGD) WITH PROPOFOL;  Surgeon: Jeani Hawking, MD;  Location: [facility];  Service: Gastroenterology;  Laterality: N/A;  EYE SURGERY  Cataract removal 2009   HERNIA REPAIR  2011   HIATAL HERNIA REPAIR  2011   LITHOTRIPSY     RIGHT/LEFT HEART CATH AND CORONARY ANGIOGRAPHY N/A 04/29/2022   Procedure: RIGHT/LEFT HEART CATH AND CORONARY ANGIOGRAPHY;  Surgeon: Yvonne Kendall, MD;  Location: [facility];  Service: Cardiovascular;  Laterality: N/A;   TONSILLECTOMY  1968   UPPER GASTROINTESTINAL ENDOSCOPY     UPPER GI ENDOSCOPY  11/02/2012   Procedure: UPPER GI ENDOSCOPY;  Surgeon: Valarie Merino, MD;  Location: [facility];  Service: General;;   VAGINAL HYSTERECTOMY N/A 06/12/2012   Procedure: HYSTERECTOMY VAGINAL;  Surgeon: Robley Fries, MD;  Location: [facility];  Service: Gynecology;   Laterality: N/A;    Family History  Problem Relation Age of Onset   Breast cancer Mother 89       with mets    Cancer Mother    Hypertension Mother    Early death Father    Heart disease Father    Kidney disease Father    Colon cancer Paternal Aunt    Cancer Paternal Aunt    Cancer Paternal Uncle    Early death Paternal Uncle    Breast cancer Maternal Grandmother    Asthma Maternal Grandmother    Cancer Maternal Grandmother    Hypertension Other        Parent   Heart disease Other        parent, grandparent   Esophageal cancer Neg Hx    Rectal cancer Neg Hx    Stomach cancer Neg Hx    Colon polyps Neg Hx     Social History   Tobacco Use   Smoking status: Never    Passive exposure: Never   Smokeless tobacco: Never  Vaping Use   Vaping status: Never Used  Substance Use Topics   Alcohol use: Never   Drug use: Never    Current Outpatient Medications  Medication Sig Dispense Refill   clopidogrel (PLAVIX) 75 MG tablet Take 1 tablet (75 mg total) by mouth daily. 90 tablet 3   cyanocobalamin (VITAMIN B12) 1000 MCG tablet Take 1,000 mcg by mouth daily.     Ferrous Sulfate Dried (HIGH POTENCY IRON) 65 MG TABS      furosemide (LASIX) 20 MG tablet Take 1 tablet (20 mg total) by mouth daily as needed for edema. 90 tablet 1   losartan (COZAAR) 25 MG tablet Take 1 tablet (25 mg total) by mouth daily. 90 tablet 3   Multiple Vitamin (MULTIVITAMIN WITH MINERALS) TABS Take 1 tablet by mouth daily.     pantoprazole (PROTONIX) 40 MG tablet Take 1 tablet (40 mg total) by mouth 2 (two) times daily. 180 tablet 3   potassium chloride SA (KLOR-CON M) 20 MEQ tablet Take 1 tablet (20 mEq total) by mouth 2 (two) times daily for 3 days, THEN 1 tablet (20 mEq total) daily. 90 tablet 3   Probiotic Product (PROBIOTIC DAILY PO) Take 1 capsule by mouth daily.     rosuvastatin (CRESTOR) 5 MG tablet Take 1 tablet (5 mg total) by mouth daily. 90 tablet 3   verapamil (CALAN-SR) 180 MG CR tablet Take 1  tablet (180 mg total) by mouth daily. 90 tablet 2   nitroGLYCERIN (NITROSTAT) 0.4 MG SL tablet Place 1 tablet (0.4 mg total) under the tongue every 5 (five) minutes as needed for chest pain. (Patient not taking: Reported on 06/25/2023) 25 tablet 3   No current facility-administered medications for this  visit.    Allergies  Allergen Reactions   Augmentin [Amoxicillin-Pot Clavulanate] Diarrhea   Metoprolol     Had prior to cor CT, felt very dizzy   Nsaids     Avoid due to diminished kidney function    Review of Systems:  neg     Physical Exam:    BP 128/80 (BP Location: Left Arm, Patient Position: Sitting, Cuff Size: Normal)   Pulse 88   Ht 5\' 4"  (1.626 m)   Wt 147 lb 8 oz (66.9 kg)   LMP  (LMP Unknown)   BMI 25.32 kg/m  Wt Readings from Last 3 Encounters:  06/25/23 147 lb 8 oz (66.9 kg)  06/09/23 149 lb 1.6 oz (67.6 kg)  06/02/23 148 lb 6.4 oz (67.3 kg)   Constitutional:  Well-developed, in no acute distress. Psychiatric: Normal mood and affect. Behavior is normal. Cardiovascular: Normal rate, regular rhythm. No edema Pulmonary/chest: Effort normal and breath sounds normal. No wheezing, rales or rhonchi. Abdominal: Soft, nondistended. Nontender. Bowel sounds active throughout. There are no masses palpable. No hepatomegaly. Rectal: Deferred Neurological: Alert and oriented to person place and time. Skin: Skin is warm and dry. No rashes noted.  Data Reviewed: I have personally reviewed following labs and imaging studies  CBC:    Latest Ref Rng & Units 05/13/2023   10:42 AM 11/04/2022    9:25 AM 09/03/2022    8:03 AM  CBC  WBC 3.4 - 10.8 x10E3/uL 5.1  7.0  6.7   Hemoglobin 11.1 - 15.9 g/dL 69.6  29.5  28.4   Hematocrit 34.0 - 46.6 % 38.6  36.8  35.8   Platelets 150 - 450 x10E3/uL 265  263.0  302.0     CMP:    Latest Ref Rng & Units 05/13/2023   10:42 AM 01/14/2023    7:29 AM 01/03/2023    7:37 AM  CMP  Glucose 70 - 99 mg/dL 91  83  132   BUN 8 - 27 mg/dL 15  17   26    Creatinine 0.57 - 1.00 mg/dL 4.40  1.02  7.25   Sodium 134 - 144 mmol/L 142  138  135   Potassium 3.5 - 5.2 mmol/L 4.7  5.0  3.2   Chloride 96 - 106 mmol/L 103  104  97   CO2 20 - 29 mmol/L 23  24  23    Calcium 8.7 - 10.3 mg/dL 9.8  9.6  8.8   Total Protein 6.0 - 8.5 g/dL   6.1   Total Bilirubin 0.0 - 1.2 mg/dL   0.5   Alkaline Phos 44 - 121 IU/L   73   AST 0 - 40 IU/L   38   ALT 0 - 32 IU/L   25        Radiology Studies: No results found.    Edman Circle, MD 06/25/2023, 8:58 AM  Cc: [name], Raymond J, MD

## 2023-06-25 NOTE — Telephone Encounter (Signed)
 [facility] Pre-operative Risk Assessment     Request for surgical clearance:     Endoscopy Procedure  What type of surgery is being performed?     EGD/Colon  When is this surgery scheduled?     08-20-23  What type of clearance is required ?   Pharmacy  Are there any medications that need to be held prior to surgery and how long? Plavix 5 day hold  Practice name and name of physician performing surgery?      [facility]  What is your office phone and fax number?      Phone- [phone]  Fax- [phone]  Anesthesia type (None, local, MAC, general) ?       MAC   Please route your response to [employer] CMA (AAMA)

## 2023-06-25 NOTE — Patient Instructions (Addendum)
 _______________________________________________________  If your blood pressure at your visit was 140/90 or greater, please contact your primary care physician to follow up on this.  _______________________________________________________  If you are age 76 or older, your body mass index should be between 23-30. Your Body mass index is 25.32 kg/m. If this is out of the aforementioned range listed, please consider follow up with your Primary Care Provider.  If you are age 27 or younger, your body mass index should be between 19-25. Your Body mass index is 25.32 kg/m. If this is out of the aformentioned range listed, please consider follow up with your Primary Care Provider.   ________________________________________________________  The [facility] providers would like to encourage you to use [facility] to communicate with providers for non-urgent requests or questions.  Due to long hold times on the telephone, sending your provider a message by [facility] may be a faster and more efficient way to get a response.  Please allow 48 business hours for a response.  Please remember that this is for non-urgent requests.  _______________________________________________________  Your provider has requested that you go to the basement level for lab work before leaving today. Press "B" on the elevator. The lab is located at the first door on the left as you exit the elevator.  Please purchase the following medications over the counter and take as directed: Miralax 17g daily as needed  Continue Protonix 40mg  2 times a day. Continue iron supplement daily  You will be contacted by our office prior to your procedure for directions on holding your Plavix.  If you do not hear from our office 2 week prior to your scheduled procedure, please call [phone] to discuss.    You have been scheduled for an endoscopy and colonoscopy. Please follow the written instructions given to you at your visit today.  If you  use inhalers (even only as needed), please bring them with you on the day of your procedure.  DO NOT TAKE 7 DAYS PRIOR TO TEST- Trulicity (dulaglutide) Ozempic, Wegovy (semaglutide) Mounjaro (tirzepatide) Bydureon Bcise (exanatide extended release)  DO NOT TAKE 1 DAY PRIOR TO YOUR TEST Rybelsus (semaglutide) Adlyxin (lixisenatide) Victoza (liraglutide) Byetta (exanatide) ___________________________________________________________________________  Thank you,  Dr. Lynann Bologna

## 2023-07-26 MED FILL — Rosuvastatin Calcium Tab 5 MG: ORAL | 90 days supply | Qty: 90 | Fill #3 | Status: AC

## 2023-08-02 ENCOUNTER — Other Ambulatory Visit: Payer: Self-pay | Admitting: Gastroenterology

## 2023-08-04 ENCOUNTER — Other Ambulatory Visit: Payer: Self-pay

## 2023-08-04 ENCOUNTER — Other Ambulatory Visit (HOSPITAL_BASED_OUTPATIENT_CLINIC_OR_DEPARTMENT_OTHER): Payer: Self-pay

## 2023-08-04 MED ORDER — PANTOPRAZOLE SODIUM 40 MG PO TBEC
40.0000 mg | DELAYED_RELEASE_TABLET | Freq: Two times a day (BID) | ORAL | 3 refills | Status: AC
  Filled 2023-08-04: qty 180, 90d supply, fill #0
  Filled 2023-10-27: qty 180, 90d supply, fill #1

## 2023-08-07 ENCOUNTER — Encounter: Payer: Self-pay | Admitting: Gastroenterology

## 2023-08-14 ENCOUNTER — Telehealth: Payer: Self-pay | Admitting: Gastroenterology

## 2023-08-14 ENCOUNTER — Ambulatory Visit (HOSPITAL_COMMUNITY)
Admission: RE | Admit: 2023-08-14 | Discharge: 2023-08-14 | Disposition: A | Discharge status: Home / Self Care | Source: Ambulatory Visit | Attending: Family Medicine | Admitting: Family Medicine

## 2023-08-14 ENCOUNTER — Encounter (HOSPITAL_COMMUNITY): Payer: Self-pay

## 2023-08-14 VITALS — BP 145/73 | HR 82 | Temp 98.2°F | Resp 16

## 2023-08-14 DIAGNOSIS — M1612 Unilateral primary osteoarthritis, left hip: Secondary | ICD-10-CM

## 2023-08-14 DIAGNOSIS — I10 Essential (primary) hypertension: Secondary | ICD-10-CM

## 2023-08-14 NOTE — Telephone Encounter (Signed)
 All questions answered in regards to plavix  hold & prep.

## 2023-08-14 NOTE — Telephone Encounter (Signed)
 Patient called and stated that she is needing for the nurse to give her a call in order to go over her prep instruction. Patient is scheduled for a endo colon for May the 28 th. Patient is requesting a call back at [phone]. Please advise.

## 2023-08-14 NOTE — ED Triage Notes (Signed)
"  Pain in the left hip on the outer side of hip. When walking sudden sharp pain.  Able to walk up and down stairs gingerly. Was scheduled for hip replacement the first of 2025 but had to be canceled due to heart issues and put on plavix ." - Entered by patient.  Patient presenting with left hip pain onset 1 year ago. Hip surgery was delayed due to having a stent placed in the heart. Scheduled to see ortho soon but pain is ongoing. Was advised by PCP to be assessed at the urgent care. No recent falls or injuries.  Prescriptions or OTC medications tried: No

## 2023-08-14 NOTE — ED Provider Notes (Signed)
 [facility]    CSN: [id number] Arrival date & time: 08/14/23  0856      History   Chief Complaint Chief Complaint  Patient presents with   Hip Pain   Appointment    HPI Sara [name] is a 78 y.o. female.   The patient presents with left hip pain when walking that is mildly worsened since January. She is having a colonoscopy in the coming weeks and hopes to have her hip replaced sometime afterward once all of her other medical issues are addressed. She denies any trauma, falls, numbness, weakness, swelling, fever, or chills.   The history is provided by the patient.  Hip Pain Pertinent negatives include no chest pain and no shortness of breath.    Past Medical History:  Diagnosis Date   Anemia 08/13/2022   Anxiety 07/2020   Aortic dilatation (HCC) 03/25/2022   38 mm noted on echo   Arthritis 11/2017   Barrett's esophagus    CAD (coronary artery disease)    Cataract    had surgery   Chronic kidney disease, stage 3a (HCC)    GERD (gastroesophageal reflux disease)    hx of    HIATAL HERNIA    s/p surgical repair   Hyperlipidemia    HYPERTENSION    Kidney stone    1990s   Known medical problems 07/12/2022   LVH (left ventricular hypertrophy)    Osteoporosis    PAT (paroxysmal atrial tachycardia) (HCC)    Premature atrial contractions     Patient Active Problem List   Diagnosis Date Noted   Cough 01/13/2023   Swelling of lower extremity 12/17/2022   Recurrent UTI 11/04/2022   Hematuria 09/16/2022   Anemia 08/13/2022   UGIB (upper gastrointestinal bleed) 07/12/2022   CAD (coronary artery disease) 07/12/2022   Known medical problems 07/12/2022   AKI (acute kidney injury) (HCC) 07/12/2022   Urinary frequency 06/28/2022   Left-sided chest pain 06/03/2022   Acute non-recurrent maxillary sinusitis 05/21/2022   Dyspnea on exertion 04/29/2022   Abnormal stress test 04/29/2022   Preoperative clearance 03/06/2022   PAC (premature atrial  contraction) 01/08/2022   Left hip pain 12/24/2021   Dyslipidemia 06/22/2021   Viral sinusitis 02/27/2021   Atrial ectopy 02/12/2021   Atrial tachycardia (HCC) 02/12/2021   Need for influenza vaccination 01/17/2021   SVT (supraventricular tachycardia) (HCC) 09/17/2020   Barrett's esophagus determined by endoscopy 05/31/2020   ascvd risk 20.5% 06/03/2019   Osteopenia 12/02/2018   Aortic atherosclerosis (HCC) 10/07/2017   SI joint arthritis (HCC) 06/17/2016   CKD (chronic kidney disease) stage 3, GFR 30-59 ml/min (HCC) 12/16/2015   Low back pain 10/06/2015   Malodorous urine 03/07/2014   GERD 02/20/2009   RENAL CALCULUS, HX OF 02/20/2009   Primary hypertension 02/14/2009   Diaphragmatic hernia 02/14/2009    Past Surgical History:  Procedure Laterality Date   ABDOMINAL HYSTERECTOMY  06/12/2012   CATARACT EXTRACTION  2009   both eyes   CHOLECYSTECTOMY  11/02/2012   Procedure: CHOLECYSTECTOMY;  Surgeon: Azucena Bollard, MD;  Location: [facility];  Service: General;;   COLOSTOMY     CORONARY STENT INTERVENTION N/A 04/29/2022   Procedure: CORONARY STENT INTERVENTION;  Surgeon: Sammy Crisp, MD;  Location: [facility];  Service: Cardiovascular;  Laterality: N/A;   CORONARY ULTRASOUND/IVUS N/A 04/29/2022   Procedure: Intravascular Ultrasound/IVUS;  Surgeon: Sammy Crisp, MD;  [facility]: [facility];  Service: Cardiovascular;  Laterality: N/A;   CYSTOCELE REPAIR N/A 06/12/2012  Procedure: ANTERIOR REPAIR (CYSTOCELE);  Surgeon: Shasta Deist, MD;  Location: [facility];  Service: Gynecology;  Laterality: N/A;   ESOPHAGOGASTRODUODENOSCOPY (EGD) WITH PROPOFOL  N/A 07/13/2022   Procedure: ESOPHAGOGASTRODUODENOSCOPY (EGD) WITH PROPOFOL ;  Surgeon: Alvis Jourdain, MD;  Location: [facility];  Service: Gastroenterology;  Laterality: N/A;   EYE SURGERY  Cataract removal 2009   HERNIA REPAIR  2011   HIATAL HERNIA REPAIR  2011   LITHOTRIPSY     RIGHT/LEFT HEART CATH AND CORONARY  ANGIOGRAPHY N/A 04/29/2022   Procedure: RIGHT/LEFT HEART CATH AND CORONARY ANGIOGRAPHY;  Surgeon: Sammy Crisp, MD;  Location: [facility];  Service: Cardiovascular;  Laterality: N/A;   TONSILLECTOMY  1968   UPPER GASTROINTESTINAL ENDOSCOPY     UPPER GI ENDOSCOPY  11/02/2012   Procedure: UPPER GI ENDOSCOPY;  Surgeon: Azucena Bollard, MD;  Location: [facility];  Service: General;;   VAGINAL HYSTERECTOMY N/A 06/12/2012   Procedure: HYSTERECTOMY VAGINAL;  Surgeon: Shasta Deist, MD;  Location: [facility];  Service: Gynecology;  Laterality: N/A;    OB History   No obstetric history on file.      Home Medications    Prior to Admission medications   Medication Sig Start Date End Date Taking? Authorizing Provider  clopidogrel  (PLAVIX ) 75 MG tablet Take 1 tablet (75 mg total) by mouth daily. 06/02/23  Yes Conte, Tessa N, PA-C  cyanocobalamin  (VITAMIN B12) 1000 MCG tablet Take 1,000 mcg by mouth daily.   Yes [provider]  Ferrous Sulfate Dried (HIGH POTENCY IRON) 65 MG TABS  07/17/22  Yes [provider]  furosemide  (LASIX ) 20 MG tablet Take 1 tablet (20 mg total) by mouth daily as needed for edema. 12/27/22  Yes Conte, Tessa N, PA-C  losartan  (COZAAR ) 25 MG tablet Take 1 tablet (25 mg total) by mouth daily. 09/30/22  Yes Hugh Madura, MD  Multiple Vitamin (MULTIVITAMIN WITH MINERALS) TABS Take 1 tablet by mouth daily.   Yes [provider]  pantoprazole  (PROTONIX ) 40 MG tablet Take 1 tablet (40 mg total) by mouth 2 (two) times daily. 08/04/23  Yes Lajuan Pila, MD  potassium chloride  SA (KLOR-CON  M) 20 MEQ tablet Take 1 tablet (20 mEq total) by mouth 2 (two) times daily for 3 days, THEN 1 tablet (20 mEq total) daily. 01/06/23 01/04/24 Yes Conte, Tessa N, PA-C  Probiotic Product (PROBIOTIC DAILY PO) Take 1 capsule by mouth daily.   Yes [provider]  rosuvastatin  (CRESTOR ) 5 MG tablet Take 1 tablet (5 mg total) by mouth daily. 09/30/22  Yes Hugh Madura,  MD  verapamil  (CALAN -SR) 180 MG CR tablet Take 1 tablet (180 mg total) by mouth daily. 09/30/22  Yes Conte, Tessa N, PA-C  nitroGLYCERIN  (NITROSTAT ) 0.4 MG SL tablet Place 1 tablet (0.4 mg total) under the tongue every 5 (five) minutes as needed for chest pain. 06/02/23 08/31/23  Von Grumbling, PA-C    Family History Family History  Problem Relation Age of Onset   Breast cancer Mother 29       with mets    Cancer Mother    Hypertension Mother    Early death Father    Heart disease Father    Kidney disease Father    Colon cancer Paternal Aunt    Cancer Paternal Aunt    Cancer Paternal Uncle    Early death Paternal Uncle    Breast cancer Maternal Grandmother    Asthma Maternal Grandmother    Cancer Maternal Grandmother  Hypertension Other        Parent   Heart disease Other        parent, grandparent   Esophageal cancer Neg Hx    Rectal cancer Neg Hx    Stomach cancer Neg Hx    Colon polyps Neg Hx     Social History Social History   Tobacco Use   Smoking status: Never    Passive exposure: Never   Smokeless tobacco: Never  Vaping Use   Vaping status: Never Used  Substance Use Topics   Alcohol use: Never   Drug use: Never     Allergies   Augmentin  [amoxicillin -pot clavulanate], Metoprolol , and Nsaids   Review of Systems Review of Systems  Constitutional:  Negative for chills and fever.  Respiratory:  Negative for shortness of breath.   Cardiovascular:  Negative for chest pain and palpitations.  Musculoskeletal:  Positive for gait problem. Negative for arthralgias, back pain, joint swelling and myalgias.       Pain in the left anterior, posterior and somewhat over the lateral hip.   Skin:  Negative for color change and rash.  Neurological:  Negative for weakness and numbness.     Physical Exam Triage Vital Signs ED Triage Vitals  Encounter Vitals Group     BP 08/14/23 0933 (!) 145/73     Systolic BP Percentile --      Diastolic BP Percentile --       Pulse Rate 08/14/23 0933 82     Resp 08/14/23 0933 16     Temp 08/14/23 0933 98.2 F (36.8 C)     Temp Source 08/14/23 0933 Oral     SpO2 08/14/23 0933 98 %     Weight --      Height --      Head Circumference --      Peak Flow --      Pain Score 08/14/23 0931 5     Pain Loc --      Pain Education --      Exclude from Growth Chart --    No data found.  Updated Vital Signs BP (!) 145/73 (BP Location: Left Arm)   Pulse 82   Temp 98.2 F (36.8 C) (Oral)   Resp 16   LMP  (LMP Unknown)   SpO2 98%   Visual Acuity Right Eye Distance:   Left Eye Distance:   Bilateral Distance:    Right Eye Near:   Left Eye Near:    Bilateral Near:     Physical Exam Vitals reviewed.  Constitutional:      General: She is not in acute distress.    Appearance: She is normal weight. She is not ill-appearing, toxic-appearing or diaphoretic.  HENT:     Head: Normocephalic and atraumatic.  Eyes:     Extraocular Movements: Extraocular movements intact.     Pupils: Pupils are equal, round, and reactive to light.  Cardiovascular:     Pulses: Normal pulses.  Pulmonary:     Effort: Pulmonary effort is normal.  Musculoskeletal:     Left lower leg: No edema.     Comments: Left Hip: Gate antalgic Leg length: equal ROM: full except IR restricted to 20 degrees with some pain elicited at peak flexion and with IR.  Strength Flexion: 5/5,  Abduction: 3/5, Adduction: 5/5 Trendelenburg sign positive Greater trochanter mildly TTP Ober's negative No tenderness over piriformis.  Posterior Hip: FABER neg for SI pain FADIR positive for anterior hip pain, log roll  negative    Skin:    General: Skin is warm.     Capillary Refill: Capillary refill takes 2 to 3 seconds.     Findings: No bruising or erythema.  Neurological:     General: No focal deficit present.     Mental Status: She is alert.     Sensory: No sensory deficit.  Psychiatric:        Mood and Affect: Mood normal.      UC Treatments  / Results  Labs (all labs ordered are listed, but only abnormal results are displayed) Labs Reviewed - No data to display  EKG   Radiology No results found.  Procedures Procedures (including critical care time)  Medications Ordered in UC Medications - No data to display  Initial Impression / Assessment and Plan / UC Course  I have reviewed the triage vital signs and the nursing notes.  Pertinent labs & imaging results that were available during my care of the patient were reviewed by me and considered in my medical decision making (see chart for details).     Hip pain secondary to osteoarthritis of the left hip -The patient was previously set to have a hip replacement in January but this was delayed due to comorbidities. - Her history, physical exam, and x-ray findings are consistent with OA of the hip.  She likely has some greater trochanteric pain syndrome as well. -We discussed treatment options including oral steroids or referral to an orthopedic office for evaluation and treatment. -After joint decision-making the patient would like to contact her PCP for possible ultrasound-guided injection of the hip for some temporary relief while she is waiting for surgery clearance. - I gave her some contact information for other orthopedic offices if her PCP is unable to do this. - She was also given printed material for hip abduction strengthening for stabilization. - The patient voiced understanding and agreement with the plan.   Hypertension -I did discuss the patient's elevated blood pressure today.  She reports taking her medications.  She is currently asymptomatic. - She will continue to monitor her blood pressure at home and follow-up with her cardiologist if it remains elevated.   Final Clinical Impressions(s) / UC Diagnoses   Final diagnoses:  Primary osteoarthritis of left hip  Essential hypertension   Discharge Instructions   None    ED Prescriptions   None     PDMP not reviewed this encounter.   Claybon Cuna, MD 08/14/23 [phone]

## 2023-08-15 ENCOUNTER — Telehealth (HOSPITAL_BASED_OUTPATIENT_CLINIC_OR_DEPARTMENT_OTHER): Payer: Self-pay | Admitting: Family Medicine

## 2023-08-15 DIAGNOSIS — M1612 Unilateral primary osteoarthritis, left hip: Secondary | ICD-10-CM

## 2023-08-15 NOTE — Telephone Encounter (Signed)
 Copied from CRM [id number]. Topic: Referral - Request for Referral >> Aug 15, 2023  8:06 AM Rosamond Comes wrote: Did the patient discuss referral with their provider in the last year? Yes  Appointment offered? No patient has already talked to Dr Eddie Good [place] about this  Type of order/referral and detailed reason for visit: Left hip replacement  Preference of office, provider, location: Dr Claiborne Crew [facility]  If referral order, have you been seen by this specialty before? No (If Yes, this issue or another issue? When? Where?  Can we respond through MyChart? Yes

## 2023-08-20 ENCOUNTER — Ambulatory Visit: Admitting: Gastroenterology

## 2023-08-20 ENCOUNTER — Encounter: Payer: Self-pay | Admitting: Gastroenterology

## 2023-08-20 VITALS — BP 92/51 | HR 77 | Temp 97.0°F | Resp 13 | Ht 64.0 in | Wt 147.0 lb

## 2023-08-20 DIAGNOSIS — D124 Benign neoplasm of descending colon: Secondary | ICD-10-CM | POA: Diagnosis not present

## 2023-08-20 DIAGNOSIS — K449 Diaphragmatic hernia without obstruction or gangrene: Secondary | ICD-10-CM | POA: Diagnosis not present

## 2023-08-20 DIAGNOSIS — I2510 Atherosclerotic heart disease of native coronary artery without angina pectoris: Secondary | ICD-10-CM | POA: Diagnosis not present

## 2023-08-20 DIAGNOSIS — Z1211 Encounter for screening for malignant neoplasm of colon: Secondary | ICD-10-CM

## 2023-08-20 DIAGNOSIS — K2270 Barrett's esophagus without dysplasia: Secondary | ICD-10-CM | POA: Diagnosis not present

## 2023-08-20 DIAGNOSIS — D123 Benign neoplasm of transverse colon: Secondary | ICD-10-CM | POA: Diagnosis not present

## 2023-08-20 DIAGNOSIS — K640 First degree hemorrhoids: Secondary | ICD-10-CM

## 2023-08-20 DIAGNOSIS — K5730 Diverticulosis of large intestine without perforation or abscess without bleeding: Secondary | ICD-10-CM | POA: Diagnosis not present

## 2023-08-20 DIAGNOSIS — D509 Iron deficiency anemia, unspecified: Secondary | ICD-10-CM

## 2023-08-20 DIAGNOSIS — I10 Essential (primary) hypertension: Secondary | ICD-10-CM | POA: Diagnosis not present

## 2023-08-20 DIAGNOSIS — Q399 Congenital malformation of esophagus, unspecified: Secondary | ICD-10-CM

## 2023-08-20 MED ORDER — SODIUM CHLORIDE 0.9 % IV SOLN: 500.0000 mL | Freq: Once | INTRAVENOUS | Status: DC

## 2023-08-20 NOTE — Progress Notes (Signed)
 To pacu, VSS. Report to [email or web address]

## 2023-08-20 NOTE — Op Note (Signed)
 [facility] Patient Name: Sara [name] Procedure Date: 08/20/2023 7:19 AM MRN: [id number] Endoscopist: Lajuan Pila , MD, [id number] Age: 77 Referring MD:  Date of Birth: [birth year] Gender: Female Account #: [id number] Procedure:                Colonoscopy Indications:              High risk colon cancer surveillance: Personal                            history of colonic polyps Medicines:                Monitored Anesthesia Care Procedure:                Pre-Anesthesia Assessment:                           - Prior to the procedure, a History and Physical                            was performed, and patient medications and                            allergies were reviewed. The patient's tolerance of                            previous anesthesia was also reviewed. The risks                            and benefits of the procedure and the sedation                            options and risks were discussed with the patient.                            All questions were answered, and informed consent                            was obtained. Prior Anticoagulants: The patient has                            taken Plavix  (clopidogrel ), last dose was 5 days                            prior to procedure. ASA Grade Assessment: III - A                            patient with severe systemic disease. After                            reviewing the risks and benefits, the patient was                            deemed in satisfactory condition to undergo the  procedure.                           After obtaining informed consent, the colonoscope                            was passed under direct vision. Throughout the                            procedure, the patient's blood pressure, pulse, and                            oxygen saturations were monitored continuously. The                            Olympus Scope [id number] was introduced through the                             anus and advanced to the 1 cm into the ileum. The                            colonoscopy was somewhat difficult due to a                            tortuous colon. Successful completion of the                            procedure was aided by applying abdominal pressure.                            The patient tolerated the procedure well. The                            quality of the bowel preparation was adequate to                            identify polyps greater than 5 mm in size. Some                            retained solid vegetable material and stool in                            several areas of the colon. Nevertheless,                            aggressive suctioning and aspiration was performed.                            Overall over 90 to 95% of the colonic mucosa was                            visualized satisfactorily. Of note that small and  flat lesions could have been missed. The ileocecal                            valve, appendiceal orifice, and rectum were                            photographed. Scope In: 8:37:56 AM Scope Out: 9:09:24 AM Scope Withdrawal Time: 0 hours 23 minutes 3 seconds  Total Procedure Duration: 0 hours 31 minutes 28 seconds  Findings:                 A 15 mm polyp was found in the proximal transverse                            colon. The polyp was sessile. The polyp was removed                            with a hot snare. Resection and retrieval were                            complete.                           Two sessile polyps were found in the proximal                            descending colon and mid descending colon. The                            polyps were 6 to 10 mm in size. These polyps were                            removed with a cold snare. Resection and retrieval                            were complete. Estimated blood loss: none.                           Multiple medium-mouthed and  small-mouthed                            diverticula were found in the sigmoid colon, few in                            descending colon and ascending colon. Mild SCAD in                            sigmoid colon.                           The terminal ileum appeared normal.                           Non-bleeding internal hemorrhoids were found during  retroflexion. The hemorrhoids were small and Grade                            I (internal hemorrhoids that do not prolapse).                           The exam was otherwise without abnormality on                            direct and retroflexion views. Complications:            No immediate complications. Estimated Blood Loss:     Estimated blood loss: none. Impression:               - One 15 mm polyp in the proximal transverse colon,                            removed with a hot snare. Resected and retrieved.                           - Two 6 to 10 mm polyps in the proximal descending                            colon and in the mid descending colon, removed with                            a cold snare. Resected and retrieved.                           - Pancolonic diverticulosis predominantly in the                            sigmoid colon. Mild SCAD.                           - The examined portion of the ileum was normal.                           - Non-bleeding internal hemorrhoids.                           - The examination was otherwise normal on direct                            and retroflexion views. Recommendation:           - Patient has a contact number available for                            emergencies. The signs and symptoms of potential                            delayed complications were discussed with the  patient. Return to normal activities tomorrow.                            Written discharge instructions were provided to the                            patient.                            - Resume previous diet.                           - Continue present medications.                           - Resume Plavix  (clopidogrel ) at prior dose in 5                            days.                           - Await pathology results.                           - Continue MiraLAX .                           - Repeat colonoscopy for surveillance based on                            pathology results.                           - The findings and recommendations were discussed                            with the patient's family. Lajuan Pila, MD 08/20/2023 9:18:46 AM This report has been signed electronically.

## 2023-08-20 NOTE — Telephone Encounter (Signed)
 Patient advised with verbal understanding

## 2023-08-20 NOTE — Progress Notes (Signed)
 Denies use of home oxygen. Denies any chest pain or shortness of breath.

## 2023-08-20 NOTE — Progress Notes (Signed)
 Called to room to assist during endoscopic procedure.  Patient ID and intended procedure confirmed with present staff. Received instructions for my participation in the procedure from the performing physician.

## 2023-08-20 NOTE — Op Note (Signed)
 [facility] Patient Name: Sara [name] Procedure Date: 08/20/2023 7:26 AM MRN: [id number] Endoscopist: Lajuan Pila , MD, [id number] Age: 78 Referring MD:  Date of Birth: Oct 21, 1945 Gender: Female Account #: [id number] Procedure:                Upper GI endoscopy Indications:              Screening for Barrett's esophagus, Surveillance                            procedure Medicines:                Monitored Anesthesia Care Procedure:                Pre-Anesthesia Assessment:                           - Prior to the procedure, a History and Physical                            was performed, and patient medications and                            allergies were reviewed. The patient's tolerance of                            previous anesthesia was also reviewed. The risks                            and benefits of the procedure and the sedation                            options and risks were discussed with the patient.                            All questions were answered, and informed consent                            was obtained. Prior Anticoagulants: Plavix  was held                            5 days prior. ASA Grade Assessment: III - A patient                            with severe systemic disease. After reviewing the                            risks and benefits, the patient was deemed in                            satisfactory condition to undergo the procedure.                           After obtaining informed consent, the endoscope was  passed under direct vision. Throughout the                            procedure, the patient's blood pressure, pulse, and                            oxygen saturations were monitored continuously. The                            Olympus Scope SN [id number] was introduced through the                            mouth, and advanced to the second part of duodenum.                            The upper GI endoscopy  was accomplished without                            difficulty. The patient tolerated the procedure                            well. Scope In: Scope Out: Findings:                 There were esophageal mucosal changes consistent                            with long-segment Barrett's esophagus present in                            the middle third of the esophagus and in the lower                            third of the esophagus extending from 24 cm up to                            34 cm (from most proximal margin to GE junction).                            The maximum longitudinal extent of these mucosal                            changes was 10 cm in length. Examined by white                            light and narrowband imaging. Mucosa was biopsied                            with a cold forceps for histology in 4 quadrants at                            intervals of 2 cm at 24, 26, 28, 30 and 32 cm from  the incisors. A total of 6 specimen bottles were                            sent to pathology. Note of the esophagus was highly                            torturous.                           A 6 cm hiatal hernia was present. Evidence of                            previous fundoplication.                           The exam was otherwise without abnormality. No                            outlet obstruction. Complications:            No immediate complications. Estimated Blood Loss:     Estimated blood loss: none. Impression:               - Esophageal mucosal changes consistent with                            long-segment Barrett's esophagus. Biopsied.                           - 6 cm hiatal hernia.                           - The examination was otherwise normal. Recommendation:           - Patient has a contact number available for                            emergencies. The signs and symptoms of potential                            delayed complications  were discussed with the                            patient. Return to normal activities tomorrow.                            Written discharge instructions were provided to the                            patient.                           - Resume previous diet.                           - Continue present medications.                           -  Await pathology results.                           - Proceed with colonoscopy.                           - The findings and recommendations were discussed                            with the patient's family. Lajuan Pila, MD 08/20/2023 9:12:57 AM This report has been signed electronically.

## 2023-08-20 NOTE — Progress Notes (Signed)
 Chief Complaint: FU  Referring Provider:  [name], Raymond J, MD      ASSESSMENT AND PLAN;   #1. IDA d/t large HH (resolved with PO iron)  #2. H/O Polyps (colon 2019)  #3. H/O UGI bleed (resolved) d/t 10mm mid esophageal ulcer on EGD 07/13/2022  #4. Large 5 cm HH (seen Dr Gaylyn Keas- avoid rpt repair) with Barrett's esophagus.  No masses or nodularity and Barrett's esophagus.  Failed fundoplication 2011  #5. CAD s/p recent DES 04/29/2022 (on plavix , off ASA)  Plan: -Continue protonix  40 BID -CBC, CMP today -Continue Iron 1 tab po qd (may turn stool dark) -Miralax  17g po every day prn (for constipation) -EGD/colon on miralax  off Plavix  x 5 days. Cleared by cardio clearence   I discussed EGD/Colonoscopy- the indications, risks, alternatives and potential complications including, but not limited to bleeding, infection, reaction to meds, damage to internal organs, cardiac and/or pulmonary problems, and perforation requiring surgery. The possibility that significant findings could be missed was explained. All ? were answered. Pt consents to proceed.  HPI:    Sara [name] is a 78 y.o. female  Patient of Dr. Howard Macho With hypertension, hyperlipidemia, CKD stage III, CAD status post DES stent to mid RCA on 04/29/22 on (bASA/clopidogrel ), hiatal hernia status post failed fundoplication 2011, gastritis, esophagitis, Barrett's esophagus  FU Much better Hb 12.9, MCV 93  Recent UTI Has CKD3 Cr 1.34 2/18  Seen by cardiology.  Taken off baby aspirin  but need to continue Plavix .  She is scheduled for EGD/colonoscopy. OK to proceed.  Discussed the use of AI scribe software for clinical note transcription with the patient, who gave verbal consent to proceed.  History of Present Illness Sara [name] is a 78 year old female with anemia and gastrointestinal issues who presents for scheduling of endoscopy and colonoscopy.   She has been cleared by cardiology for the procedures and  has been off aspirin  as per her cardiologist's instructions. She is currently taking Protonix  twice a day.  Her anemia is improving, and she feels she is regaining strength with no recent dizzy spells. Her hemoglobin level was 12.7 in February, and she continues to take iron tablets.  She experiences occasional discomfort in the area of a previous ulcer but denies any recent bleeding. She uses Miralax  as needed for constipation, which she describes as intermittent.  She recently had a urinary tract infection, which she describes as mild.     From prev notes: Adm with hematemesis. Hb 14 (05/2022) to 6.9 s/p 1U to 8.4.  EGD 07/13/2022 identified a 5 cm hiatal hernia, and esophageal ulcer without stigmata of recent bleeding and evidence of Barrett's esophagus. Stomach and duodenum were normal. On PPI bid. Plavix  restarted post EGD.  Started iron 65mg  po QD Got constipated- taking miralax  Hb 9.4. Stopped Carafate .  Does feel somewhat better.  Stool has cleared.  No nausea, vomiting, hematemesis, dysphagia or odynophagia.  No fever or chills.  Has history of chronic constipation for which she uses MiraLAX .  We have gone over recent EGD, CT Abdo/pelvis, labs etc.  She is under close cardiology follow-up as well.  It has been recommended to continue baby aspirin /Plavix  for now.  She has seen Dr. Gaylyn Keas for surgical repair of hiatal hernia last year-recommended conservative management.     Latest Ref Rng & Units 06/25/2023    9:23 AM 05/13/2023   10:42 AM 11/04/2022    9:25 AM  CBC  WBC 4.0 - 10.5 K/uL  8.3  5.1  7.0   Hemoglobin 12.0 - 15.0 g/dL 82.9  56.2  13.0   Hematocrit 36.0 - 46.0 % 40.3  38.6  36.8   Platelets 150.0 - 400.0 K/uL 271.0  265  263.0      Past GI workup: EGD by Dr. Nickey Barn 07/13/2022: - Esophageal ulcer with no stigmata of recent bleeding.  - 5 cm hiatal hernia.  - Gastroesophageal flap valve classified as Hill Grade IV (no fold, wide open lumen, hiatal hernia  present).  - Esophageal mucosal changes classified as Barrett's stage C13-M15 per Prague criteria.  - Normal stomach.  - Normal examined duodenum.  - No specimens collected.  CTA 07/12/2022 chest Large hiatal hernia. There is a dilated fluid-filled esophagus. Submucosal hyperenhancement as well along the lower esophagus. Further workup when clinically appropriate. Significant breathing motion significantly limiting evaluation for pulmonary emboli. No large or central embolus.  UGI series 07/2021 1. Small to moderate recurrent hiatal hernia. 2. Severe spontaneous gastroesophageal reflux, noting undigested food debris throughout the refluxed material in the esophagus. 3. Moderate esophageal dysmotility, compatible with chronic reflux related dysmotility. 4. Small periampullary duodenal diverticulum. Otherwise normal upper GI. No masses or strictures.  01/2018 colonoscopy Dr. Howard Macho 15 mm polyp cecum 3 mm polyp descending colon diverticula  12/10/2019 endoscopy for indigestion, remote fundoplication for large HH, Barrett's sowed significantly anatomical abnormal proximal stomach metaplasia is intact however the diaphragm is a medium size hiatal hernia free reflux of gastric secretion in the esophagus no retained solid or liquid food in the stomach nonspecific mild distal gastritis esophagus torturous GE junction single erosion, Barrett's appearing mucosa up to 25 cm mild reflux related esophagitis Pathology showed Barrett's changes without dysplasia recall 3 years.    Past Medical History:  Diagnosis Date   Anemia 08/13/2022   Anxiety 07/2020   Aortic dilatation (HCC) 03/25/2022   38 mm noted on echo   Arthritis 11/2017   Barrett's esophagus    CAD (coronary artery disease)    Cataract    had surgery   Chronic kidney disease, stage 3a (HCC)    GERD (gastroesophageal reflux disease)    hx of    HIATAL HERNIA    s/p surgical repair   Hyperlipidemia    HYPERTENSION    Kidney  stone    1990s   Known medical problems 07/12/2022   LVH (left ventricular hypertrophy)    Osteoporosis    PAT (paroxysmal atrial tachycardia) (HCC)    Premature atrial contractions     Past Surgical History:  Procedure Laterality Date   ABDOMINAL HYSTERECTOMY  06/12/2012   CATARACT EXTRACTION  2009   both eyes   CHOLECYSTECTOMY  11/02/2012   Procedure: CHOLECYSTECTOMY;  Surgeon: Azucena Bollard, MD;  Location: [facility];  Service: General;;   COLOSTOMY     CORONARY STENT INTERVENTION N/A 04/29/2022   Procedure: CORONARY STENT INTERVENTION;  Surgeon: Sammy Crisp, MD;  Location: [facility];  Service: Cardiovascular;  Laterality: N/A;   CORONARY ULTRASOUND/IVUS N/A 04/29/2022   Procedure: Intravascular Ultrasound/IVUS;  Surgeon: Sammy Crisp, MD;  Location: [facility];  Service: Cardiovascular;  Laterality: N/A;   CYSTOCELE REPAIR N/A 06/12/2012   Procedure: ANTERIOR REPAIR (CYSTOCELE);  Surgeon: Shasta Deist, MD;  Location: [facility];  Service: Gynecology;  Laterality: N/A;   ESOPHAGOGASTRODUODENOSCOPY (EGD) WITH PROPOFOL  N/A 07/13/2022   Procedure: ESOPHAGOGASTRODUODENOSCOPY (EGD) WITH PROPOFOL ;  Surgeon: Alvis Jourdain, MD;  Location: [facility];  Service: Gastroenterology;  Laterality: N/A;  EYE SURGERY  Cataract removal 2009   HERNIA REPAIR  2011   HIATAL HERNIA REPAIR  2011   LITHOTRIPSY     RIGHT/LEFT HEART CATH AND CORONARY ANGIOGRAPHY N/A 04/29/2022   Procedure: RIGHT/LEFT HEART CATH AND CORONARY ANGIOGRAPHY;  Surgeon: Sammy Crisp, MD;  Location: [facility];  Service: Cardiovascular;  Laterality: N/A;   TONSILLECTOMY  1968   UPPER GASTROINTESTINAL ENDOSCOPY     UPPER GI ENDOSCOPY  11/02/2012   Procedure: UPPER GI ENDOSCOPY;  Surgeon: Azucena Bollard, MD;  Location: [facility];  Service: General;;   VAGINAL HYSTERECTOMY N/A 06/12/2012   Procedure: HYSTERECTOMY VAGINAL;  Surgeon: Shasta Deist, MD;  Location: [facility];  Service: Gynecology;   Laterality: N/A;    Family History  Problem Relation Age of Onset   Breast cancer Mother 27       with mets    Cancer Mother    Hypertension Mother    Early death Father    Heart disease Father    Kidney disease Father    Colon cancer Paternal Aunt    Cancer Paternal Aunt    Cancer Paternal Uncle    Early death Paternal Uncle    Breast cancer Maternal Grandmother    Asthma Maternal Grandmother    Cancer Maternal Grandmother    Hypertension Other        Parent   Heart disease Other        parent, grandparent   Esophageal cancer Neg Hx    Rectal cancer Neg Hx    Stomach cancer Neg Hx    Colon polyps Neg Hx     Social History   Tobacco Use   Smoking status: Never    Passive exposure: Never   Smokeless tobacco: Never  Vaping Use   Vaping status: Never Used  Substance Use Topics   Alcohol use: Never   Drug use: Never    Current Outpatient Medications  Medication Sig Dispense Refill   losartan  (COZAAR ) 25 MG tablet Take 1 tablet (25 mg total) by mouth daily. 90 tablet 3   Multiple Vitamin (MULTIVITAMIN WITH MINERALS) TABS Take 1 tablet by mouth daily.     pantoprazole  (PROTONIX ) 40 MG tablet Take 1 tablet (40 mg total) by mouth 2 (two) times daily. 180 tablet 3   Probiotic Product (PROBIOTIC DAILY PO) Take 1 capsule by mouth daily.     rosuvastatin  (CRESTOR ) 5 MG tablet Take 1 tablet (5 mg total) by mouth daily. 90 tablet 3   verapamil  (CALAN -SR) 180 MG CR tablet Take 1 tablet (180 mg total) by mouth daily. 90 tablet 2   clopidogrel  (PLAVIX ) 75 MG tablet Take 1 tablet (75 mg total) by mouth daily. 90 tablet 3   cyanocobalamin  (VITAMIN B12) 1000 MCG tablet Take 1,000 mcg by mouth daily.     Ferrous Sulfate Dried (HIGH POTENCY IRON) 65 MG TABS      furosemide  (LASIX ) 20 MG tablet Take 1 tablet (20 mg total) by mouth daily as needed for edema. 90 tablet 1   nitroGLYCERIN  (NITROSTAT ) 0.4 MG SL tablet Place 1 tablet (0.4 mg total) under the tongue every 5 (five) minutes as  needed for chest pain. 25 tablet 3   potassium chloride  SA (KLOR-CON  M) 20 MEQ tablet Take 1 tablet (20 mEq total) by mouth 2 (two) times daily for 3 days, THEN 1 tablet (20 mEq total) daily. 90 tablet 3   Current Facility-Administered Medications  Medication Dose Route Frequency Provider Last Rate Last  Admin   0.9 %  sodium chloride  infusion  500 mL Intravenous Once Lajuan Pila, MD        Allergies  Allergen Reactions   Augmentin  [Amoxicillin -Pot Clavulanate] Diarrhea   Metoprolol  Other (See Comments)    Had prior to cor CT, felt very dizzy   Nsaids Other (See Comments)    Avoid due to diminished kidney function    Review of Systems:  neg     Physical Exam:    BP 124/87   Pulse 77   Temp (!) 97 F (36.1 C) (Temporal)   Ht 5\' 4"  (1.626 m)   Wt 147 lb (66.7 kg)   LMP  (LMP Unknown)   SpO2 97%   BMI 25.23 kg/m  Wt Readings from Last 3 Encounters:  08/20/23 147 lb (66.7 kg)  06/25/23 147 lb 8 oz (66.9 kg)  06/09/23 149 lb 1.6 oz (67.6 kg)   Constitutional:  Well-developed, in no acute distress. Psychiatric: Normal mood and affect. Behavior is normal. Cardiovascular: Normal rate, regular rhythm. No edema Pulmonary/chest: Effort normal and breath sounds normal. No wheezing, rales or rhonchi. Abdominal: Soft, nondistended. Nontender. Bowel sounds active throughout. There are no masses palpable. No hepatomegaly. Rectal: Deferred Neurological: Alert and oriented to person place and time. Skin: Skin is warm and dry. No rashes noted.  Data Reviewed: I have personally reviewed following labs and imaging studies  CBC:    Latest Ref Rng & Units 06/25/2023    9:23 AM 05/13/2023   10:42 AM 11/04/2022    9:25 AM  CBC  WBC 4.0 - 10.5 K/uL 8.3  5.1  7.0   Hemoglobin 12.0 - 15.0 g/dL 19.1  47.8  29.5   Hematocrit 36.0 - 46.0 % 40.3  38.6  36.8   Platelets 150.0 - 400.0 K/uL 271.0  265  263.0     CMP:    Latest Ref Rng & Units 06/25/2023    9:23 AM 05/13/2023   10:42 AM  01/14/2023    7:29 AM  CMP  Glucose 70 - 99 mg/dL 96  91  83   BUN 6 - 23 mg/dL 18  15  17    Creatinine 0.40 - 1.20 mg/dL 6.21  3.08  6.57   Sodium 135 - 145 mEq/L 140  142  138   Potassium 3.5 - 5.1 mEq/L 4.4  4.7  5.0   Chloride 96 - 112 mEq/L 105  103  104   CO2 19 - 32 mEq/L 26  23  24    Calcium  8.4 - 10.5 mg/dL 9.7  9.8  9.6   Total Protein 6.0 - 8.3 g/dL 6.8     Total Bilirubin 0.2 - 1.2 mg/dL 0.7     Alkaline Phos 39 - 117 U/L 70     AST 0 - 37 U/L 14     ALT 0 - 35 U/L 10          Radiology Studies: No results found.    Magnus Schuller, MD 08/20/2023, 8:12 AM  Cc: [name], Raymond J, MD

## 2023-08-20 NOTE — Patient Instructions (Addendum)
 Thank you for letting us  take care of your healthcare needs today. Please see handouts given to you on Hiatal Hernia, Barrett's Esophagus, Polyps, Diverticulosis and Hemorrhoids. You may resume your Plavix  at Prior dose on Sara [name]. Continue Miralax . Await pathology results.     YOU HAD AN ENDOSCOPIC PROCEDURE TODAY AT THE [facility]:   Refer to the procedure report that was given to you for any specific questions about what was found during the examination.  If the procedure report does not answer your questions, please call your gastroenterologist to clarify.  If you requested that your care partner not be given the details of your procedure findings, then the procedure report has been included in a sealed envelope for you to review at your convenience later.  YOU SHOULD EXPECT: Some feelings of bloating in the abdomen. Passage of more gas than usual.  Walking can help get rid of the air that was put into your GI tract during the procedure and reduce the bloating. If you had a lower endoscopy (such as a colonoscopy or flexible sigmoidoscopy) you may notice spotting of blood in your stool or on the toilet paper. If you underwent a bowel prep for your procedure, you may not have a normal bowel movement for a few days.  Please Note:  You might notice some irritation and congestion in your nose or some drainage.  This is from the oxygen used during your procedure.  There is no need for concern and it should clear up in a day or so.  SYMPTOMS TO REPORT IMMEDIATELY:  Following lower endoscopy (colonoscopy or flexible sigmoidoscopy):  Excessive amounts of blood in the stool  Significant tenderness or worsening of abdominal pains  Swelling of the abdomen that is new, acute  Fever of 100F or higher  Following upper endoscopy (EGD)  Vomiting of blood or coffee ground material  New chest pain or pain under the shoulder blades  Painful or persistently difficult swallowing  New shortness  of breath  Fever of 100F or higher  Black, tarry-looking stools  For urgent or emergent issues, a gastroenterologist can be reached at any hour by calling [phone]. Do not use MyChart messaging for urgent concerns.    DIET:  We do recommend a small meal at first, but then you may proceed to your regular diet.  Drink plenty of fluids but you should avoid alcoholic beverages for 24 hours.  ACTIVITY:  You should plan to take it easy for the rest of today and you should NOT DRIVE or use heavy machinery until tomorrow (because of the sedation medicines used during the test).    FOLLOW UP: Our staff will call the number listed on your records the next business day following your procedure.  We will call around 7:15- 8:00 am to check on you and address any questions or concerns that you may have regarding the information given to you following your procedure. If we do not reach you, we will leave a message.     If any biopsies were taken you will be contacted by phone or by letter within the next 1-3 weeks.  Please call us  at [phone] if you have not heard about the biopsies in 3 weeks.    SIGNATURES/CONFIDENTIALITY: You and/or your care partner have signed paperwork which will be entered into your electronic medical record.  These signatures attest to the fact that that the information above on your After Visit Summary has been reviewed and is understood.  Full responsibility of the confidentiality of this discharge information lies with you and/or your care-partner.

## 2023-08-21 ENCOUNTER — Telehealth: Payer: Self-pay | Admitting: *Deleted

## 2023-08-21 NOTE — Telephone Encounter (Signed)
  Follow up Call-     08/20/2023    7:29 AM  Call back number  Post procedure Call Back phone  # [phone]  Permission to leave phone message Yes     Patient questions:  Do you have a fever, pain , or abdominal swelling? No. Pain Score  0 *  Have you tolerated food without any problems? Yes.    Have you been able to return to your normal activities? Yes.    Do you have any questions about your discharge instructions: Diet   No. Medications  No. Follow up visit  No.  Do you have questions or concerns about your Care? Yes.    Actions: * If pain score is 4 or above: No action needed, pain <4.  Pt stated she had minimal bleeding yesterday and bleeding improved today.  Explained that small amount of bleeding is normal but if bleeding increases to please contact [facility] office.  Pt verbalized understanding.

## 2023-08-25 LAB — SURGICAL PATHOLOGY

## 2023-08-29 ENCOUNTER — Telehealth: Payer: Self-pay | Admitting: Gastroenterology

## 2023-08-29 NOTE — Telephone Encounter (Signed)
 PT had an EGD/colon on 5/28 and just had her first soft BM and it was full of blood. She wanted to know if that was normal. Requesting to speak about her symptoms. Please advise.

## 2023-08-29 NOTE — Telephone Encounter (Signed)
 Patient had an endo on 5/28 with Dr. Venice Gillis & had her first bowel movement yesterday, and noticed bright red blood. No clots. Small episode this morning as well with bm. Denies any pain, n/v. It is noted on colon report that patient does have internal hemorrhoids. Advised that she continue to monitor bleeding for now, and if it returns in larger amounts or occurring more frequently then to give us  a call back & can always call in over the weekend if needed. Pt verbalized all understanding.

## 2023-09-01 ENCOUNTER — Observation Stay (HOSPITAL_COMMUNITY)
Admission: EM | Admit: 2023-09-01 | Discharge: 2023-09-02 | Disposition: A | Discharge status: Home / Self Care | Attending: Family Medicine | Admitting: Family Medicine

## 2023-09-01 ENCOUNTER — Encounter (HOSPITAL_COMMUNITY): Payer: Self-pay

## 2023-09-01 ENCOUNTER — Other Ambulatory Visit: Payer: Self-pay

## 2023-09-01 ENCOUNTER — Emergency Department (HOSPITAL_COMMUNITY)

## 2023-09-01 DIAGNOSIS — K625 Hemorrhage of anus and rectum: Secondary | ICD-10-CM | POA: Diagnosis not present

## 2023-09-01 DIAGNOSIS — K648 Other hemorrhoids: Secondary | ICD-10-CM | POA: Diagnosis not present

## 2023-09-01 DIAGNOSIS — K921 Melena: Secondary | ICD-10-CM | POA: Diagnosis not present

## 2023-09-01 DIAGNOSIS — R0602 Shortness of breath: Secondary | ICD-10-CM | POA: Diagnosis not present

## 2023-09-01 DIAGNOSIS — K5730 Diverticulosis of large intestine without perforation or abscess without bleeding: Secondary | ICD-10-CM | POA: Diagnosis not present

## 2023-09-01 DIAGNOSIS — Z7902 Long term (current) use of antithrombotics/antiplatelets: Secondary | ICD-10-CM | POA: Insufficient documentation

## 2023-09-01 DIAGNOSIS — N1831 Chronic kidney disease, stage 3a: Secondary | ICD-10-CM | POA: Insufficient documentation

## 2023-09-01 DIAGNOSIS — R7989 Other specified abnormal findings of blood chemistry: Secondary | ICD-10-CM

## 2023-09-01 DIAGNOSIS — I2510 Atherosclerotic heart disease of native coronary artery without angina pectoris: Secondary | ICD-10-CM | POA: Insufficient documentation

## 2023-09-01 DIAGNOSIS — K922 Gastrointestinal hemorrhage, unspecified: Secondary | ICD-10-CM | POA: Diagnosis not present

## 2023-09-01 DIAGNOSIS — I129 Hypertensive chronic kidney disease with stage 1 through stage 4 chronic kidney disease, or unspecified chronic kidney disease: Secondary | ICD-10-CM | POA: Insufficient documentation

## 2023-09-01 DIAGNOSIS — K2270 Barrett's esophagus without dysplasia: Secondary | ICD-10-CM

## 2023-09-01 DIAGNOSIS — Z79899 Other long term (current) drug therapy: Secondary | ICD-10-CM | POA: Insufficient documentation

## 2023-09-01 DIAGNOSIS — I471 Supraventricular tachycardia, unspecified: Secondary | ICD-10-CM

## 2023-09-01 DIAGNOSIS — I4719 Other supraventricular tachycardia: Secondary | ICD-10-CM

## 2023-09-01 DIAGNOSIS — D62 Acute posthemorrhagic anemia: Secondary | ICD-10-CM | POA: Diagnosis not present

## 2023-09-01 DIAGNOSIS — Z955 Presence of coronary angioplasty implant and graft: Secondary | ICD-10-CM | POA: Insufficient documentation

## 2023-09-01 DIAGNOSIS — Z860100 Personal history of colon polyps, unspecified: Secondary | ICD-10-CM

## 2023-09-01 DIAGNOSIS — D649 Anemia, unspecified: Secondary | ICD-10-CM

## 2023-09-01 DIAGNOSIS — N183 Chronic kidney disease, stage 3 unspecified: Secondary | ICD-10-CM | POA: Diagnosis not present

## 2023-09-01 DIAGNOSIS — K219 Gastro-esophageal reflux disease without esophagitis: Secondary | ICD-10-CM | POA: Diagnosis not present

## 2023-09-01 DIAGNOSIS — K633 Ulcer of intestine: Secondary | ICD-10-CM | POA: Diagnosis not present

## 2023-09-01 DIAGNOSIS — R000 Tachycardia, unspecified: Secondary | ICD-10-CM | POA: Diagnosis not present

## 2023-09-01 LAB — COMPREHENSIVE METABOLIC PANEL WITH GFR
ALT: 11 U/L (ref 0–44)
AST: 18 U/L (ref 15–41)
Albumin: 3.8 g/dL (ref 3.5–5.0)
Alkaline Phosphatase: 78 U/L (ref 38–126)
Anion gap: 12 (ref 5–15)
BUN: 24 mg/dL — ABNORMAL HIGH (ref 8–23)
CO2: 25 mmol/L (ref 22–32)
Calcium: 9.2 mg/dL (ref 8.9–10.3)
Chloride: 100 mmol/L (ref 98–111)
Creatinine, Ser: 1.66 mg/dL — ABNORMAL HIGH (ref 0.44–1.00)
GFR, Estimated: 32 mL/min — ABNORMAL LOW (ref >60)
Glucose, Bld: 113 mg/dL — ABNORMAL HIGH (ref 70–99)
Potassium: 3.6 mmol/L (ref 3.5–5.1)
Sodium: 137 mmol/L (ref 135–145)
Total Bilirubin: 0.6 mg/dL (ref 0.0–1.2)
Total Protein: 6.9 g/dL (ref 6.5–8.1)

## 2023-09-01 LAB — CBC WITH DIFFERENTIAL/PLATELET
Abs Immature Granulocytes: 0.02 10*3/uL (ref 0.00–0.07)
Basophils Absolute: 0 10*3/uL (ref 0.0–0.1)
Basophils Relative: 0 %
Eosinophils Absolute: 0.1 10*3/uL (ref 0.0–0.5)
Eosinophils Relative: 1 %
HCT: 31.5 % — ABNORMAL LOW (ref 36.0–46.0)
Hemoglobin: 10.2 g/dL — ABNORMAL LOW (ref 12.0–15.0)
Immature Granulocytes: 0 %
Lymphocytes Relative: 31 %
Lymphs Abs: 1.9 10*3/uL (ref 0.7–4.0)
MCH: 30.7 pg (ref 26.0–34.0)
MCHC: 32.4 g/dL (ref 30.0–36.0)
MCV: 94.9 fL (ref 80.0–100.0)
Monocytes Absolute: 0.5 10*3/uL (ref 0.1–1.0)
Monocytes Relative: 8 %
Neutro Abs: 3.6 10*3/uL (ref 1.7–7.7)
Neutrophils Relative %: 60 %
Platelets: 244 10*3/uL (ref 150–400)
RBC: 3.32 MIL/uL — ABNORMAL LOW (ref 3.87–5.11)
RDW: 14.7 % (ref 11.5–15.5)
WBC: 6.1 10*3/uL (ref 4.0–10.5)
nRBC: 0 % (ref 0.0–0.2)

## 2023-09-01 LAB — CBC
HCT: 34.8 % — ABNORMAL LOW (ref 36.0–46.0)
Hemoglobin: 11.3 g/dL — ABNORMAL LOW (ref 12.0–15.0)
MCH: 30.4 pg (ref 26.0–34.0)
MCHC: 32.5 g/dL (ref 30.0–36.0)
MCV: 93.5 fL (ref 80.0–100.0)
Platelets: 284 10*3/uL (ref 150–400)
RBC: 3.72 MIL/uL — ABNORMAL LOW (ref 3.87–5.11)
RDW: 14.8 % (ref 11.5–15.5)
WBC: 5.5 10*3/uL (ref 4.0–10.5)
nRBC: 0 % (ref 0.0–0.2)

## 2023-09-01 LAB — TYPE AND SCREEN
ABO/RH(D): O POS
Antibody Screen: NEGATIVE

## 2023-09-01 LAB — POC OCCULT BLOOD, ED: Fecal Occult Bld: POSITIVE — AB

## 2023-09-01 MED ORDER — TRAZODONE HCL 50 MG PO TABS: 25.0000 mg | ORAL_TABLET | Freq: Every evening | ORAL | Status: DC | PRN

## 2023-09-01 MED ORDER — SODIUM CHLORIDE 0.9 % IV SOLN: INTRAVENOUS | Status: AC

## 2023-09-01 MED ORDER — ONDANSETRON HCL 4 MG/2ML IJ SOLN
4.0000 mg | Freq: Four times a day (QID) | INTRAMUSCULAR | Status: DC | PRN
Start: 2023-09-01 — End: 2023-09-02

## 2023-09-01 MED ORDER — ACETAMINOPHEN 325 MG PO TABS
650.0000 mg | ORAL_TABLET | Freq: Four times a day (QID) | ORAL | Status: DC | PRN
Start: 2023-09-01 — End: 2023-09-02

## 2023-09-01 MED ORDER — BOOST / RESOURCE BREEZE PO LIQD CUSTOM
1.0000 | Freq: Three times a day (TID) | ORAL | Status: DC
  Administered 2023-09-01: 1 via ORAL

## 2023-09-01 MED ORDER — BISACODYL 5 MG PO TBEC
10.0000 mg | DELAYED_RELEASE_TABLET | Freq: Once | ORAL | Status: AC
  Administered 2023-09-01: 10 mg via ORAL
  Filled 2023-09-01: qty 2

## 2023-09-01 MED ORDER — NA SULFATE-K SULFATE-MG SULF 17.5-3.13-1.6 GM/177ML PO SOLN
0.5000 | Freq: Once | ORAL | Status: AC
  Administered 2023-09-01: 177 mL via ORAL

## 2023-09-01 MED ORDER — HYDRALAZINE HCL 20 MG/ML IJ SOLN: 5.0000 mg | Freq: Four times a day (QID) | INTRAMUSCULAR | Status: DC | PRN

## 2023-09-01 MED ORDER — ONDANSETRON HCL 4 MG PO TABS: 4.0000 mg | ORAL_TABLET | Freq: Four times a day (QID) | ORAL | Status: DC | PRN

## 2023-09-01 MED ORDER — ALBUTEROL SULFATE (2.5 MG/3ML) 0.083% IN NEBU: 2.5000 mg | INHALATION_SOLUTION | RESPIRATORY_TRACT | Status: DC | PRN

## 2023-09-01 MED ORDER — ACETAMINOPHEN 650 MG RE SUPP
650.0000 mg | Freq: Four times a day (QID) | RECTAL | Status: DC | PRN
Start: 2023-09-01 — End: 2023-09-02

## 2023-09-01 MED ORDER — NA SULFATE-K SULFATE-MG SULF 17.5-3.13-1.6 GM/177ML PO SOLN
0.5000 | Freq: Once | ORAL | Status: AC
  Administered 2023-09-01: 177 mL via ORAL
  Filled 2023-09-01: qty 1

## 2023-09-01 NOTE — ED Provider Notes (Signed)
 [place] EMERGENCY DEPARTMENT AT [facility] Provider Note   CSN: [id number] Arrival date & time: 09/01/23  1914     History {Add pertinent medical, surgical, social history, OB history to HPI:1} Chief Complaint  Patient presents with   Blood In Stools    Sara [name] is a 78 y.o. female.  HPI   Patient has a history of hiatal hernia hypertension acid reflux Barrett's esophagus coronary artery disease chronic kidney disease, anemia.  Patient had an upper endoscopy and colonoscopy performed on May 28.  Patient states she had biopsies of her esophagus.  Patient states she also had polyps removed.  Patient states she is normally on Plavix  but she stopped it 5 days prior to her procedure.  She restarted it 5 days after.  Patient states she started to notice blood in her stool over the last couple days.  It started on Friday.  Patient also has been feeling somewhat short of breath and lightheaded.  She called her GI doctor and was told to come to the ED for evaluation.  Home Medications Prior to Admission medications   Medication Sig Start Date End Date Taking? Authorizing Provider  clopidogrel  (PLAVIX ) 75 MG tablet Take 1 tablet (75 mg total) by mouth daily. 06/02/23   Von Grumbling, PA-C  cyanocobalamin  (VITAMIN B12) 1000 MCG tablet Take 1,000 mcg by mouth daily.    [provider]  Ferrous Sulfate Dried (HIGH POTENCY IRON) 65 MG TABS  07/17/22   [provider]  furosemide  (LASIX ) 20 MG tablet Take 1 tablet (20 mg total) by mouth daily as needed for edema. 12/27/22   Von Grumbling, PA-C  losartan  (COZAAR ) 25 MG tablet Take 1 tablet (25 mg total) by mouth daily. 09/30/22   Hugh Madura, MD  Multiple Vitamin (MULTIVITAMIN WITH MINERALS) TABS Take 1 tablet by mouth daily.    [provider]  nitroGLYCERIN  (NITROSTAT ) 0.4 MG SL tablet Place 1 tablet (0.4 mg total) under the tongue every 5 (five) minutes as needed for chest pain. 06/02/23 08/31/23   Von Grumbling, PA-C  pantoprazole  (PROTONIX ) 40 MG tablet Take 1 tablet (40 mg total) by mouth 2 (two) times daily. 08/04/23   Lajuan Pila, MD  potassium chloride  SA (KLOR-CON  M) 20 MEQ tablet Take 1 tablet (20 mEq total) by mouth 2 (two) times daily for 3 days, THEN 1 tablet (20 mEq total) daily. 01/06/23 01/04/24  Von Grumbling, PA-C  Probiotic Product (PROBIOTIC DAILY PO) Take 1 capsule by mouth daily.    [provider]  rosuvastatin  (CRESTOR ) 5 MG tablet Take 1 tablet (5 mg total) by mouth daily. 09/30/22   Hugh Madura, MD  verapamil  (CALAN -SR) 180 MG CR tablet Take 1 tablet (180 mg total) by mouth daily. 09/30/22   Von Grumbling, PA-C      Allergies    Augmentin  [amoxicillin -pot clavulanate], Metoprolol , and Nsaids    Review of Systems   Review of Systems  Physical Exam Updated Vital Signs BP 121/65 (BP Location: Right Arm)   Pulse 84   Temp 98 F (36.7 C) (Oral)   Resp 19   LMP  (LMP Unknown)   SpO2 100%  Physical Exam Vitals and nursing note reviewed.  Constitutional:      General: She is not in acute distress.    Appearance: She is well-developed.  HENT:     Head: Normocephalic and atraumatic.     Right Ear: External ear normal.  Left Ear: External ear normal.  Eyes:     General: No scleral icterus.       Right eye: No discharge.        Left eye: No discharge.     Conjunctiva/sclera: Conjunctivae normal.  Neck:     Trachea: No tracheal deviation.  Cardiovascular:     Rate and Rhythm: Normal rate and regular rhythm.  Pulmonary:     Effort: Pulmonary effort is normal. No respiratory distress.     Breath sounds: Normal breath sounds. No stridor. No wheezing or rales.  Abdominal:     General: Bowel sounds are normal. There is no distension.     Palpations: Abdomen is soft.     Tenderness: There is no abdominal tenderness. There is no guarding or rebound.  Genitourinary:    Comments: Maroon-colored stool noted on rectal exam Musculoskeletal:         General: No tenderness or deformity.     Cervical back: Neck supple.  Skin:    General: Skin is warm and dry.     Findings: No rash.  Neurological:     General: No focal deficit present.     Mental Status: She is alert.     Cranial Nerves: No cranial nerve deficit, dysarthria or facial asymmetry.     Sensory: No sensory deficit.     Motor: No abnormal muscle tone or seizure activity.     Coordination: Coordination normal.  Psychiatric:        Mood and Affect: Mood normal.     ED Results / Procedures / Treatments   Labs (all labs ordered are listed, but only abnormal results are displayed) Labs Reviewed  COMPREHENSIVE METABOLIC PANEL WITH GFR  CBC  POC OCCULT BLOOD, ED  TYPE AND SCREEN    EKG EKG Interpretation Date/Time:  Monday September 01 2023 09:54:28 EDT Ventricular Rate:  108 PR Interval:  141 QRS Duration:  83 QT Interval:  374 QTC Calculation: 502 R Axis:   15  Text Interpretation: Sinus tachycardia with irregular rate Nonspecific T abnrm, anterolateral leads Prolonged QT interval No significant change since last tracing Confirmed by Trish Furl [phone]) on 09/01/2023 10:16:36 AM  Radiology No results found.  Procedures Procedures  {Document cardiac monitor, telemetry assessment procedure when appropriate:1}  Medications Ordered in ED Medications - No data to display  ED Course/ Medical Decision Making/ A&P   {   Click here for ABCD2, HEART and other calculatorsREFRESH Note before signing :1}                              Medical Decision Making Amount and/or Complexity of Data Reviewed Labs: ordered. Radiology: ordered.   ***  {Document critical care time when appropriate:1} {Document review of labs and clinical decision tools ie heart score, Chads2Vasc2 etc:1}  {Document your independent review of radiology images, and any outside records:1} {Document your discussion with family members, caretakers, and with consultants:1} {Document social  determinants of health affecting pt's care:1} {Document your decision making why or why not admission, treatments were needed:1} Final Clinical Impression(s) / ED Diagnoses Final diagnoses:  None    Rx / DC Orders ED Discharge Orders     None

## 2023-09-01 NOTE — H&P (Signed)
 History and Physical  Sara [name] ZOX:[id number] DOB: 1945-05-30 DOA: 09/01/2023  PCP: [name], Raymond J, MD   Chief Complaint: Bloody stool  HPI: Sara [name] is a 78 y.o. female with medical history significant for hypertension, acid reflux recent colonoscopy 5/28 with polypectomies being admitted to the hospital with hematochezia and blood loss anemia.  She underwent screening colonoscopy and EGD on 5/28, had multiple polyps removed including two 6 to 10 mm polyps.  Per recommendations, she held her Plavix  for the ensuing 5 days, resumed Plavix  on 6/2.  3 days later approximately 6/5, she noticed maroon-colored stools, this has continued.  She denies any abdominal pain, she has some associated dizziness.  She denies chest pain, abdominal pain, nausea or any other complaints.  Review of Systems: Please see HPI for pertinent positives and negatives. A complete 10 system review of systems are otherwise negative.  Past Medical History:  Diagnosis Date   Anemia 08/13/2022   Anxiety 07/2020   Aortic dilatation (HCC) 03/25/2022   38 mm noted on echo   Arthritis 11/2017   Barrett's esophagus    CAD (coronary artery disease)    Cataract    had surgery   Chronic kidney disease, stage 3a (HCC)    GERD (gastroesophageal reflux disease)    hx of    HIATAL HERNIA    s/p surgical repair   Hyperlipidemia    HYPERTENSION    Kidney stone    1990s   Known medical problems 07/12/2022   LVH (left ventricular hypertrophy)    Osteoporosis    PAT (paroxysmal atrial tachycardia) (HCC)    Premature atrial contractions    Past Surgical History:  Procedure Laterality Date   ABDOMINAL HYSTERECTOMY  06/12/2012   CATARACT EXTRACTION  2009   both eyes   CHOLECYSTECTOMY  11/02/2012   Procedure: CHOLECYSTECTOMY;  Surgeon: Azucena Bollard, MD;  Location: [facility];  Service: General;;   COLOSTOMY     CORONARY STENT INTERVENTION N/A 04/29/2022   Procedure: CORONARY STENT INTERVENTION;   Surgeon: Sammy Crisp, MD;  Location: [facility];  Service: Cardiovascular;  Laterality: N/A;   CORONARY ULTRASOUND/IVUS N/A 04/29/2022   Procedure: Intravascular Ultrasound/IVUS;  Surgeon: Sammy Crisp, MD;  Location: [facility];  Service: Cardiovascular;  Laterality: N/A;   CYSTOCELE REPAIR N/A 06/12/2012   Procedure: ANTERIOR REPAIR (CYSTOCELE);  Surgeon: Shasta Deist, MD;  Location: [facility];  Service: Gynecology;  Laterality: N/A;   ESOPHAGOGASTRODUODENOSCOPY (EGD) WITH PROPOFOL  N/A 07/13/2022   Procedure: ESOPHAGOGASTRODUODENOSCOPY (EGD) WITH PROPOFOL ;  Surgeon: Alvis Jourdain, MD;  Location: [facility];  Service: Gastroenterology;  Laterality: N/A;   EYE SURGERY  Cataract removal 2009   HERNIA REPAIR  2011   HIATAL HERNIA REPAIR  2011   LITHOTRIPSY     RIGHT/LEFT HEART CATH AND CORONARY ANGIOGRAPHY N/A 04/29/2022   Procedure: RIGHT/LEFT HEART CATH AND CORONARY ANGIOGRAPHY;  Surgeon: Sammy Crisp, MD;  [facility]: [facility];  Service: Cardiovascular;  Laterality: N/A;   TONSILLECTOMY  1968   UPPER GASTROINTESTINAL ENDOSCOPY     UPPER GI ENDOSCOPY  11/02/2012   Procedure: UPPER GI ENDOSCOPY;  Surgeon: Azucena Bollard, MD;  Location: [facility];  Service: General;;   VAGINAL HYSTERECTOMY N/A 06/12/2012   Procedure: HYSTERECTOMY VAGINAL;  Surgeon: Shasta Deist, MD;  Location: [facility];  Service: Gynecology;  Laterality: N/A;   Social History:  reports that she has never smoked. She has never been exposed to tobacco smoke. She has  never used smokeless tobacco. She reports that she does not drink alcohol and does not use drugs.  Allergies  Allergen Reactions   Augmentin  [Amoxicillin -Pot Clavulanate] Diarrhea   Metoprolol  Other (See Comments)    Had prior to cor CT, felt very dizzy   Nsaids Other (See Comments)    Avoid due to diminished kidney function    Family History  Problem Relation Age of Onset   Breast cancer Mother 71       with mets    Cancer  Mother    Hypertension Mother    Early death Father    Heart disease Father    Kidney disease Father    Colon cancer Paternal Aunt    Cancer Paternal Aunt    Cancer Paternal Uncle    Early death Paternal Uncle    Breast cancer Maternal Grandmother    Asthma Maternal Grandmother    Cancer Maternal Grandmother    Hypertension Other        Parent   Heart disease Other        parent, grandparent   Esophageal cancer Neg Hx    Rectal cancer Neg Hx    Stomach cancer Neg Hx    Colon polyps Neg Hx      Prior to Admission medications   Medication Sig Start Date End Date Taking? Authorizing Provider  clopidogrel  (PLAVIX ) 75 MG tablet Take 1 tablet (75 mg total) by mouth daily. 06/02/23   Von Grumbling, PA-C  cyanocobalamin  (VITAMIN B12) 1000 MCG tablet Take 1,000 mcg by mouth daily.    [provider]  Ferrous Sulfate Dried (HIGH POTENCY IRON) 65 MG TABS  07/17/22   [provider]  furosemide  (LASIX ) 20 MG tablet Take 1 tablet (20 mg total) by mouth daily as needed for edema. 12/27/22   Von Grumbling, PA-C  losartan  (COZAAR ) 25 MG tablet Take 1 tablet (25 mg total) by mouth daily. 09/30/22   Hugh Madura, MD  Multiple Vitamin (MULTIVITAMIN WITH MINERALS) TABS Take 1 tablet by mouth daily.    [provider]  nitroGLYCERIN  (NITROSTAT ) 0.4 MG SL tablet Place 1 tablet (0.4 mg total) under the tongue every 5 (five) minutes as needed for chest pain. 06/02/23 08/31/23  Von Grumbling, PA-C  pantoprazole  (PROTONIX ) 40 MG tablet Take 1 tablet (40 mg total) by mouth 2 (two) times daily. 08/04/23   Lajuan Pila, MD  potassium chloride  SA (KLOR-CON  M) 20 MEQ tablet Take 1 tablet (20 mEq total) by mouth 2 (two) times daily for 3 days, THEN 1 tablet (20 mEq total) daily. 01/06/23 01/04/24  Von Grumbling, PA-C  Probiotic Product (PROBIOTIC DAILY PO) Take 1 capsule by mouth daily.    [provider]  rosuvastatin  (CRESTOR ) 5 MG tablet Take 1 tablet (5 mg total) by mouth  daily. 09/30/22   Hugh Madura, MD  verapamil  (CALAN -SR) 180 MG CR tablet Take 1 tablet (180 mg total) by mouth daily. 09/30/22   Von Grumbling, PA-C    Physical Exam: BP 110/62   Pulse 86   Temp 98 F (36.7 C) (Oral)   Resp (!) 21   LMP  (LMP Unknown)   SpO2 98%  General:  Alert, oriented, calm, in no acute distress  Eyes: EOMI, clear conjuctivae, white sclerea Neck: supple, no masses, trachea mildline  Cardiovascular: RRR, no murmurs or rubs, no peripheral edema  Respiratory: clear to auscultation bilaterally, no wheezes, no crackles  Abdomen: soft, nontender, nondistended, normal bowel  tones heard  Skin: dry, no rashes  Musculoskeletal: no joint effusions, normal range of motion  Psychiatric: appropriate affect, normal speech  Neurologic: extraocular muscles intact, clear speech, moving all extremities with intact sensorium         Labs on Admission:  Basic Metabolic Panel: Recent Labs  Lab 09/01/23 1030  NA 137  K 3.6  CL 100  CO2 25  GLUCOSE 113*  BUN 24*  CREATININE 1.66*  CALCIUM  9.2   Liver Function Tests: Recent Labs  Lab 09/01/23 1030  AST 18  ALT 11  ALKPHOS 78  BILITOT 0.6  PROT 6.9  ALBUMIN 3.8   No results for input(s): "LIPASE", "AMYLASE" in the last 168 hours. No results for input(s): "AMMONIA" in the last 168 hours. CBC: Recent Labs  Lab 09/01/23 1030  WBC 5.5  HGB 11.3*  HCT 34.8*  MCV 93.5  PLT 284   Cardiac Enzymes: No results for input(s): "CKTOTAL", "CKMB", "CKMBINDEX", "TROPONINI" in the last 168 hours. BNP (last 3 results) No results for input(s): "BNP" in the last 8760 hours.  ProBNP (last 3 results) Recent Labs    01/03/23 0737  PROBNP 201    CBG: No results for input(s): "GLUCAP" in the last 168 hours.  Radiological Exams on Admission: DG Chest 2 View Result Date: 09/01/2023 CLINICAL DATA:  Shortness of breath EXAM: CHEST - 2 VIEW COMPARISON:  January 13, 2023 FINDINGS: The heart size and mediastinal contours are  within normal limits. Both lungs are clear. The visualized skeletal structures are unremarkable. IMPRESSION: No active cardiopulmonary disease. Electronically Signed   By: Fredrich Jefferson M.D.   On: 09/01/2023 11:10   Assessment/Plan Imoni Kohen is a 78 y.o. female with medical history significant for hypertension, acid reflux recent colonoscopy 5/28 with polypectomies being admitted to the hospital with hematochezia and blood loss anemia.  Hematochezia and blood loss anemia-hemoglobin has fallen from baseline of about 13, to 11.3.  In the ER today, she has Hemoccult positive, maroon-colored stool.  Concerned that this may be ongoing bleeding in the setting of polypectomy, and resuming her Plavix .  Patient is hemodynamically stable and does not require blood transfusion. -Observation admission -Clear liquid diet -Seen by GI already today, they plan colonoscopy in the morning -Hold Plavix   Hyperlipidemia-Crestor   Hypertension-systolic blood pressure 110, will hold home antihypertensives for now.  Will use IV hydralazine  for SBP greater than 160.  GERD-Protonix   DVT prophylaxis: SCDs only    Code Status: Full Code  Consults called: [facility]  Admission status: Observation  Time spent: 48 minutes  Dallyn Bergland Rickey Charm MD [facility] Pager [phone]  If 7PM-7AM, please contact night-coverage [email or web address] Password [email or web address]  09/01/2023, 1:32 PM

## 2023-09-01 NOTE — H&P (View-Only) (Signed)
 Attending physician's note   I have taken a history, reviewed the chart, and examined the patient. I performed a substantive portion of this encounter, including complete performance of at least one of the key components, in conjunction with the APP. I agree with the APP's note, impression, and recommendations with my edits.   78 year old female with medical history as outlined below, presents with hematochezia and acute blood loss anemia after recent EGD/colonoscopy.  Underwent colonoscopy in 08/12/2023 which was notable for 15 mm transverse colon polyp removed via hot snare along with two 6-10 mm polyps removed from the ascending colon via cold snare, pandiverticulosis with mild SCAD in the sigmoid, and internal hemorrhoids. EGD that same day with long segment nondysplastic Barrett's Esophagus, large sick centimeter hiatal hernia with evidence of prior fundoplication.  Had some scant BRB shortly after colonoscopy which resolved, then developed hematochezia shortly after resuming her Plavix  5 days post procedure.  Admission evaluation notable for the following: - H/H 11.3/34.8 with MCV/RDW 93.5/14.8 (baseline Hgb ~12.5-13.5) - BUN/creatinine 20/1.66 (baseline creatinine~1.3), with otherwise normal CMP - FOBT positive  1) Hematochezia 2) Acute blood loss anemia Discussed DDx for hematochezia and 2 g decline in hemoglobin, to include post polypectomy bleed along with possibility of diverticular bleed, small AVM, hemorrhoidal bleed, etc. with plan for the following: - Clear liquids today with n.p.o. at midnight - Bowel prep this evening - Colonoscopy tomorrow for diagnostic and therapeutic intent - Hold Plavix .  Did discuss some intraoperative limitations since Plavix  will not be washed out, but ultimately ok to proceed with colonoscopy for therapeutic intent - Trend serial CBCs while in house  3) GERD 4) Long segment, nondysplastic Barrett's Esophagus - Resume PPI  5) CKD 3 - Trend  daily BMP - Management per primary Hospital service   The indications, risks, and benefits of colonoscopy were explained to the patient in detail. Risks include but are not limited to bleeding, perforation, adverse reaction to medications, and cardiopulmonary compromise. Sequelae include but are not limited to the possibility of surgery, hospitalization, and mortality. The patient verbalized understanding and wished to proceed. All questions answered.   [address], DO, [place] [phone] office          Consultation  Referring Provider: Dr. Monnie Anthony     Primary Care Physician:  [name], [name], MD Primary Gastroenterologist:  Dr. Venice Gillis       Reason for Consultation:  GI Bleed            HPI:   Sara [name] is a 78 y.o. female past medical history listed hypertension, acid reflux, anemia presenting to the ER today with complaint of maroon colored stools.    At time of presentation patient described that she had an upper endoscopy and colonoscopy on 08/20/2023 and had biopsies of her esophagus.  Also polyps removed.  Normally on Plavix  but she stopped it 5 days prior to her procedure and restarted 5 days after.  Apparently noticed blood in her stool for the past couple of days that it started on Friday, 08/29/23.  Associated symptoms include shortness of breath and lightheadedness.    08/20/2023 colonoscopy and EGD.  Colonoscopy with a 15 mm polyp removed from the proximal transverse colon via hot snare, two 6-10 mm polyps in the proximal ascending colon removed with cold snare, pancolonic diverticulosis predominantly in the sigmoid colon, mild SCAD, and nonbleeding internal hemorrhoid.  Biopsy showed tubular adenomas.  EGD for screening for Barrett's esophagus/surveillance with esophageal  mucosal changes consistent with long segment Barrett's esophagus biopsied and a 6 cm hiatal.  Biopsy showed columnar mucosa with intestinal metaplasia consistent with Barrett's esophagus at 32 cm,  30, 28, 26 and 24 cm.    Today, patient presents to the hospital and explains that she has had some issues with bleeding ever since the time of her colonoscopy.  Apparently had some bright red blood just when she came home and was told this was likely hemorrhoids, it stopped over the next couple of days.  She did well for a while and in fact remembers having a normal brown stool on Wednesday, 08/27/2023.  She had restarted her Plavix  on 08/27/2023 after waiting 5 days.  Since then though she has had some trouble with maroon-colored stool which started on Thursday evening.  This is only with a bowel movement and appears to be "rusty" in the toilet bowl.  Tells me she has been having 2 bowel movements a day and the bleeding only comes with the stool, but she has also had a decreased appetite and notes a 7 pound weight loss over this time.  She is not sure if it is related or not.  Also has been using MiraLAX  daily to avoid constipation.  Her last dose of Plavix  was this morning 09/01/2023.     Denies fever, chills or symptoms that awaken her from sleep.  ER Course: Hemoccult positive, maroon-colored stool, hemoglobin 13.4 on/2/25--> 11.3 today, normal MCV.  CMP with a BUN minimally elevated at 24, creatinine 1.66, glucose 113.  Past Medical History:  Diagnosis Date   Anemia 08/13/2022   Anxiety 07/2020   Aortic dilatation (HCC) 03/25/2022   38 mm noted on echo   Arthritis 11/2017   Barrett's esophagus    CAD (coronary artery disease)    Cataract    had surgery   Chronic kidney disease, stage 3a (HCC)    GERD (gastroesophageal reflux disease)    hx of    HIATAL HERNIA    s/p surgical repair   Hyperlipidemia    HYPERTENSION    Kidney stone    1990s   Known medical problems 07/12/2022   LVH (left ventricular hypertrophy)    Osteoporosis    PAT (paroxysmal atrial tachycardia) (HCC)    Premature atrial contractions     Past Surgical History:  Procedure Laterality Date   ABDOMINAL HYSTERECTOMY   06/12/2012   CATARACT EXTRACTION  2009   both eyes   CHOLECYSTECTOMY  11/02/2012   Procedure: CHOLECYSTECTOMY;  Surgeon: Azucena Bollard, MD;  Location: [facility];  Service: General;;   COLOSTOMY     CORONARY STENT INTERVENTION N/A 04/29/2022   Procedure: CORONARY STENT INTERVENTION;  Surgeon: Sammy Crisp, MD;  [facility]: [facility];  Service: Cardiovascular;  Laterality: N/A;   CORONARY ULTRASOUND/IVUS N/A 04/29/2022   Procedure: Intravascular Ultrasound/IVUS;  Surgeon: Sammy Crisp, MD;  [facility]: [facility];  Service: Cardiovascular;  Laterality: N/A;   CYSTOCELE REPAIR N/A 06/12/2012   Procedure: ANTERIOR REPAIR (CYSTOCELE);  Surgeon: Shasta Deist, MD;  Location: [facility];  Service: Gynecology;  Laterality: N/A;   ESOPHAGOGASTRODUODENOSCOPY (EGD) WITH PROPOFOL  N/A 07/13/2022   Procedure: ESOPHAGOGASTRODUODENOSCOPY (EGD) WITH PROPOFOL ;  Surgeon: Alvis Jourdain, MD;  Location: [facility];  Service: Gastroenterology;  Laterality: N/A;   EYE SURGERY  Cataract removal 2009   HERNIA REPAIR  2011   HIATAL HERNIA REPAIR  2011   LITHOTRIPSY     RIGHT/LEFT HEART CATH AND CORONARY ANGIOGRAPHY N/A 04/29/2022  Procedure: RIGHT/LEFT HEART CATH AND CORONARY ANGIOGRAPHY;  Surgeon: Sammy Crisp, MD;  [facility];  Service: Cardiovascular;  Laterality: N/A;   TONSILLECTOMY  1968   UPPER GASTROINTESTINAL ENDOSCOPY     UPPER GI ENDOSCOPY  11/02/2012   Procedure: UPPER GI ENDOSCOPY;  Surgeon: Azucena Bollard, MD;  Location: [facility];  Service: General;;   VAGINAL HYSTERECTOMY N/A 06/12/2012   Procedure: HYSTERECTOMY VAGINAL;  Surgeon: Shasta Deist, MD;  Location: [facility];  Service: Gynecology;  Laterality: N/A;    Family History  Problem Relation Age of Onset   Breast cancer Mother 53       with mets    Cancer Mother    Hypertension Mother    Early death Father    Heart disease Father    Kidney disease Father    Colon cancer Paternal Aunt    Cancer  Paternal Aunt    Cancer Paternal Uncle    Early death Paternal Uncle    Breast cancer Maternal Grandmother    Asthma Maternal Grandmother    Cancer Maternal Grandmother    Hypertension Other        Parent   Heart disease Other        parent, grandparent   Esophageal cancer Neg Hx    Rectal cancer Neg Hx    Stomach cancer Neg Hx    Colon polyps Neg Hx     Social History   Tobacco Use   Smoking status: Never    Passive exposure: Never   Smokeless tobacco: Never  Vaping Use   Vaping status: Never Used  Substance Use Topics   Alcohol use: Never   Drug use: Never    Prior to Admission medications   Medication Sig Start Date End Date Taking? Authorizing Provider  clopidogrel  (PLAVIX ) 75 MG tablet Take 1 tablet (75 mg total) by mouth daily. 06/02/23   Von Grumbling, PA-C  cyanocobalamin  (VITAMIN B12) 1000 MCG tablet Take 1,000 mcg by mouth daily.    [provider]  Ferrous Sulfate Dried (HIGH POTENCY IRON) 65 MG TABS  07/17/22   [provider]  furosemide  (LASIX ) 20 MG tablet Take 1 tablet (20 mg total) by mouth daily as needed for edema. 12/27/22   Von Grumbling, PA-C  losartan  (COZAAR ) 25 MG tablet Take 1 tablet (25 mg total) by mouth daily. 09/30/22   Hugh Madura, MD  Multiple Vitamin (MULTIVITAMIN WITH MINERALS) TABS Take 1 tablet by mouth daily.    [provider]  nitroGLYCERIN  (NITROSTAT ) 0.4 MG SL tablet Place 1 tablet (0.4 mg total) under the tongue every 5 (five) minutes as needed for chest pain. 06/02/23 08/31/23  Von Grumbling, PA-C  pantoprazole  (PROTONIX ) 40 MG tablet Take 1 tablet (40 mg total) by mouth 2 (two) times daily. 08/04/23   Lajuan Pila, MD  potassium chloride  SA (KLOR-CON  M) 20 MEQ tablet Take 1 tablet (20 mEq total) by mouth 2 (two) times daily for 3 days, THEN 1 tablet (20 mEq total) daily. 01/06/23 01/04/24  Von Grumbling, PA-C  Probiotic Product (PROBIOTIC DAILY PO) Take 1 capsule by mouth daily.    [provider]   rosuvastatin  (CRESTOR ) 5 MG tablet Take 1 tablet (5 mg total) by mouth daily. 09/30/22   Hugh Madura, MD  verapamil  (CALAN -SR) 180 MG CR tablet Take 1 tablet (180 mg total) by mouth daily. 09/30/22   Von Grumbling, PA-C    No current facility-administered medications for  this encounter.   Current Outpatient Medications  Medication Sig Dispense Refill   clopidogrel  (PLAVIX ) 75 MG tablet Take 1 tablet (75 mg total) by mouth daily. 90 tablet 3   cyanocobalamin  (VITAMIN B12) 1000 MCG tablet Take 1,000 mcg by mouth daily.     Ferrous Sulfate Dried (HIGH POTENCY IRON) 65 MG TABS      furosemide  (LASIX ) 20 MG tablet Take 1 tablet (20 mg total) by mouth daily as needed for edema. 90 tablet 1   losartan  (COZAAR ) 25 MG tablet Take 1 tablet (25 mg total) by mouth daily. 90 tablet 3   Multiple Vitamin (MULTIVITAMIN WITH MINERALS) TABS Take 1 tablet by mouth daily.     nitroGLYCERIN  (NITROSTAT ) 0.4 MG SL tablet Place 1 tablet (0.4 mg total) under the tongue every 5 (five) minutes as needed for chest pain. 25 tablet 3   pantoprazole  (PROTONIX ) 40 MG tablet Take 1 tablet (40 mg total) by mouth 2 (two) times daily. 180 tablet 3   potassium chloride  SA (KLOR-CON  M) 20 MEQ tablet Take 1 tablet (20 mEq total) by mouth 2 (two) times daily for 3 days, THEN 1 tablet (20 mEq total) daily. 90 tablet 3   Probiotic Product (PROBIOTIC DAILY PO) Take 1 capsule by mouth daily.     rosuvastatin  (CRESTOR ) 5 MG tablet Take 1 tablet (5 mg total) by mouth daily. 90 tablet 3   verapamil  (CALAN -SR) 180 MG CR tablet Take 1 tablet (180 mg total) by mouth daily. 90 tablet 2    Allergies as of 09/01/2023 - Review Complete 09/01/2023  Allergen Reaction Noted   Augmentin  [amoxicillin -pot clavulanate] Diarrhea 01/29/2018   Metoprolol  Other (See Comments) 02/27/2022   Nsaids Other (See Comments) 04/24/2022     Review of Systems:    Constitutional: No weight loss, fever or chills Skin: No rash  Cardiovascular: No chest  pain Respiratory: No SOB Gastrointestinal: See HPI and otherwise negative Genitourinary: No dysuria  Neurological: +dizziness Musculoskeletal: No new muscle or joint pain Hematologic: No bruising Psychiatric: No history of depression or anxiety    Physical Exam:  Vital signs in last 24 hours: Temp:  [98 F (36.7 C)] 98 F (36.7 C) (06/09 0948) Pulse Rate:  [66-121] 84 (06/09 1030) Resp:  [16-19] 19 (06/09 1030) BP: (121-161)/(65-72) 121/65 (06/09 0950) SpO2:  [100 %] 100 % (06/09 1030)   General:   Pleasant elderly Caucasian female appears to be in NAD, Well developed, Well nourished, alert and cooperative Head:  Normocephalic and atraumatic. Eyes:   PEERL, EOMI. No icterus. Conjunctiva pink. Ears:  Normal auditory acuity. Neck:  Supple Throat: Oral cavity and pharynx without inflammation, swelling or lesion. Teeth in good condition. Lungs: Respirations even and unlabored. Lungs clear to auscultation bilaterally.   No wheezes, crackles, or rhonchi.  Heart: Normal S1, S2. No MRG. Regular rate and rhythm. No peripheral edema, cyanosis or pallor.  Abdomen:  Soft, nondistended, nontender. No rebound or guarding. Normal bowel sounds. No appreciable masses or hepatomegaly. Rectal: Maroon stool per ER Msk:  Symmetrical without gross deformities. Peripheral pulses intact.  Extremities:  Without edema, no deformity or joint abnormality. Normal ROM, normal sensation. Neurologic:  Alert and  oriented x4;  grossly normal neurologically. Skin:   Dry and intact without significant lesions or rashes. Psychiatric: Demonstrates good judgement and reason without abnormal affect or behaviors.   LAB RESULTS: Recent Labs    09/01/23 1030  WBC 5.5  HGB 11.3*  HCT 34.8*  PLT 284   BMET Recent  Labs    09/01/23 1030  NA 137  K 3.6  CL 100  CO2 25  GLUCOSE 113*  BUN 24*  CREATININE 1.66*  CALCIUM  9.2   LFT Recent Labs    09/01/23 1030  PROT 6.9  ALBUMIN 3.8  AST 18  ALT 11   ALKPHOS 78  BILITOT 0.6   STUDIES: DG Chest 2 View Result Date: 09/01/2023 CLINICAL DATA:  Shortness of breath EXAM: CHEST - 2 VIEW COMPARISON:  January 13, 2023 FINDINGS: The heart size and mediastinal contours are within normal limits. Both lungs are clear. The visualized skeletal structures are unremarkable. IMPRESSION: No active cardiopulmonary disease. Electronically Signed   By: Fredrich Jefferson M.D.   On: 09/01/2023 11:10    Impression / Plan:   Impression: 1.  GI bleed: With decreasing hemoglobin and elevated BUN, recent EGD and colonoscopy, EGD with multiple biopsies for Barretts esophagus, colonoscopy with removal of large polyp via hot snare in the transverse colon which is the most likely culprit 2.  CAD: on Plavix , last dose this morning  3. CKD stage 3 4. Barretts Esophagus  Plan: 1.  Plan for colonoscopy tomorrow with Dr. Karene Oto.  Patient be on a clear liquid diet today and n.p.o. at midnight.  Did discuss risks, benefits, limitations and alternatives and the patient agrees to proceed. 2.  Patient will have Suprep starting this afternoon in split dose fashion. 3.  Continue to monitor hemoglobin and transfusion as needed less than 7 4.  Hold Plavix  for now  Thank you for your kind consultation, we will continue to follow.  Sara [name]  09/01/2023, 12:45 PM

## 2023-09-01 NOTE — Consult Note (Addendum)
 Attending physician's note   I have taken a history, reviewed the chart, and examined the patient. I performed a substantive portion of this encounter, including complete performance of at least one of the key components, in conjunction with the APP. I agree with the APP's note, impression, and recommendations with my edits.   78 year old female with medical history as outlined below, presents with hematochezia and acute blood loss anemia after recent EGD/colonoscopy.  Underwent colonoscopy in 08/12/2023 which was notable for 15 mm transverse colon polyp removed via hot snare along with two 6-10 mm polyps removed from the ascending colon via cold snare, pandiverticulosis with mild SCAD in the sigmoid, and internal hemorrhoids. EGD that same day with long segment nondysplastic Barrett's Esophagus, large sick centimeter hiatal hernia with evidence of prior fundoplication.  Had some scant BRB shortly after colonoscopy which resolved, then developed hematochezia shortly after resuming her Plavix  5 days post procedure.  Admission evaluation notable for the following: - H/H 11.3/34.8 with MCV/RDW 93.5/14.8 (baseline Hgb ~12.5-13.5) - BUN/creatinine 20/1.66 (baseline creatinine~1.3), with otherwise normal CMP - FOBT positive  1) Hematochezia 2) Acute blood loss anemia Discussed DDx for hematochezia and 2 g decline in hemoglobin, to include post polypectomy bleed along with possibility of diverticular bleed, small AVM, hemorrhoidal bleed, etc. with plan for the following: - Clear liquids today with n.p.o. at midnight - Bowel prep this evening - Colonoscopy tomorrow for diagnostic and therapeutic intent - Hold Plavix .  Did discuss some intraoperative limitations since Plavix  will not be washed out, but ultimately ok to proceed with colonoscopy for therapeutic intent - Trend serial CBCs while in house  3) GERD 4) Long segment, nondysplastic Barrett's Esophagus - Resume PPI  5) CKD 3 - Trend  daily BMP - Management per primary Hospital service   The indications, risks, and benefits of colonoscopy were explained to the patient in detail. Risks include but are not limited to bleeding, perforation, adverse reaction to medications, and cardiopulmonary compromise. Sequelae include but are not limited to the possibility of surgery, hospitalization, and mortality. The patient verbalized understanding and wished to proceed. All questions answered.   [address], DO, [place] [phone] office          Consultation  Referring Provider: Dr. Monnie Anthony     Primary Care Physician:  [name], [name], MD Primary Gastroenterologist:  Dr. Venice Gillis       Reason for Consultation:  GI Bleed            HPI:   Sara [name] is a 78 y.o. female past medical history listed hypertension, acid reflux, anemia presenting to the ER today with complaint of maroon colored stools.    At time of presentation patient described that she had an upper endoscopy and colonoscopy on 08/20/2023 and had biopsies of her esophagus.  Also polyps removed.  Normally on Plavix  but she stopped it 5 days prior to her procedure and restarted 5 days after.  Apparently noticed blood in her stool for the past couple of days that it started on Friday, 08/29/23.  Associated symptoms include shortness of breath and lightheadedness.    08/20/2023 colonoscopy and EGD.  Colonoscopy with a 15 mm polyp removed from the proximal transverse colon via hot snare, two 6-10 mm polyps in the proximal ascending colon removed with cold snare, pancolonic diverticulosis predominantly in the sigmoid colon, mild SCAD, and nonbleeding internal hemorrhoid.  Biopsy showed tubular adenomas.  EGD for screening for Barrett's esophagus/surveillance with esophageal  mucosal changes consistent with long segment Barrett's esophagus biopsied and a 6 cm hiatal.  Biopsy showed columnar mucosa with intestinal metaplasia consistent with Barrett's esophagus at 32 cm,  30, 28, 26 and 24 cm.    Today, patient presents to the hospital and explains that she has had some issues with bleeding ever since the time of her colonoscopy.  Apparently had some bright red blood just when she came home and was told this was likely hemorrhoids, it stopped over the next couple of days.  She did well for a while and in fact remembers having a normal brown stool on Wednesday, 08/27/2023.  She had restarted her Plavix  on 08/27/2023 after waiting 5 days.  Since then though she has had some trouble with maroon-colored stool which started on Thursday evening.  This is only with a bowel movement and appears to be "rusty" in the toilet bowl.  Tells me she has been having 2 bowel movements a day and the bleeding only comes with the stool, but she has also had a decreased appetite and notes a 7 pound weight loss over this time.  She is not sure if it is related or not.  Also has been using MiraLAX  daily to avoid constipation.  Her last dose of Plavix  was this morning 09/01/2023.     Denies fever, chills or symptoms that awaken her from sleep.  ER Course: Hemoccult positive, maroon-colored stool, hemoglobin 13.4 on/2/25--> 11.3 today, normal MCV.  CMP with a BUN minimally elevated at 24, creatinine 1.66, glucose 113.  Past Medical History:  Diagnosis Date   Anemia 08/13/2022   Anxiety 07/2020   Aortic dilatation (HCC) 03/25/2022   38 mm noted on echo   Arthritis 11/2017   Barrett's esophagus    CAD (coronary artery disease)    Cataract    had surgery   Chronic kidney disease, stage 3a (HCC)    GERD (gastroesophageal reflux disease)    hx of    HIATAL HERNIA    s/p surgical repair   Hyperlipidemia    HYPERTENSION    Kidney stone    1990s   Known medical problems 07/12/2022   LVH (left ventricular hypertrophy)    Osteoporosis    PAT (paroxysmal atrial tachycardia) (HCC)    Premature atrial contractions     Past Surgical History:  Procedure Laterality Date   ABDOMINAL HYSTERECTOMY   06/12/2012   CATARACT EXTRACTION  2009   both eyes   CHOLECYSTECTOMY  11/02/2012   Procedure: CHOLECYSTECTOMY;  Surgeon: Azucena Bollard, MD;  Location: [facility];  Service: General;;   COLOSTOMY     CORONARY STENT INTERVENTION N/A 04/29/2022   Procedure: CORONARY STENT INTERVENTION;  Surgeon: Sammy Crisp, MD;  Location: [facility];  Service: Cardiovascular;  Laterality: N/A;   CORONARY ULTRASOUND/IVUS N/A 04/29/2022   Procedure: Intravascular Ultrasound/IVUS;  Surgeon: Sammy Crisp, MD;  Location: [facility];  Service: Cardiovascular;  Laterality: N/A;   CYSTOCELE REPAIR N/A 06/12/2012   Procedure: ANTERIOR REPAIR (CYSTOCELE);  Surgeon: Shasta Deist, MD;  Location: [facility];  Service: Gynecology;  Laterality: N/A;   ESOPHAGOGASTRODUODENOSCOPY (EGD) WITH PROPOFOL  N/A 07/13/2022   Procedure: ESOPHAGOGASTRODUODENOSCOPY (EGD) WITH PROPOFOL ;  Surgeon: Alvis Jourdain, MD;  Location: [facility];  Service: Gastroenterology;  Laterality: N/A;   EYE SURGERY  Cataract removal 2009   HERNIA REPAIR  2011   HIATAL HERNIA REPAIR  2011   LITHOTRIPSY     RIGHT/LEFT HEART CATH AND CORONARY ANGIOGRAPHY N/A 04/29/2022  Procedure: RIGHT/LEFT HEART CATH AND CORONARY ANGIOGRAPHY;  Surgeon: Sammy Crisp, MD;  Location: [facility];  Service: Cardiovascular;  Laterality: N/A;   TONSILLECTOMY  1968   UPPER GASTROINTESTINAL ENDOSCOPY     UPPER GI ENDOSCOPY  11/02/2012   Procedure: UPPER GI ENDOSCOPY;  Surgeon: Azucena Bollard, MD;  Location: [facility];  Service: General;;   VAGINAL HYSTERECTOMY N/A 06/12/2012   Procedure: HYSTERECTOMY VAGINAL;  Surgeon: Shasta Deist, MD;  Location: [facility];  Service: Gynecology;  Laterality: N/A;    Family History  Problem Relation Age of Onset   Breast cancer Mother 53       with mets    Cancer Mother    Hypertension Mother    Early death Father    Heart disease Father    Kidney disease Father    Colon cancer Paternal Aunt    Cancer  Paternal Aunt    Cancer Paternal Uncle    Early death Paternal Uncle    Breast cancer Maternal Grandmother    Asthma Maternal Grandmother    Cancer Maternal Grandmother    Hypertension Other        Parent   Heart disease Other        parent, grandparent   Esophageal cancer Neg Hx    Rectal cancer Neg Hx    Stomach cancer Neg Hx    Colon polyps Neg Hx     Social History   Tobacco Use   Smoking status: Never    Passive exposure: Never   Smokeless tobacco: Never  Vaping Use   Vaping status: Never Used  Substance Use Topics   Alcohol use: Never   Drug use: Never    Prior to Admission medications   Medication Sig Start Date End Date Taking? Authorizing Provider  clopidogrel  (PLAVIX ) 75 MG tablet Take 1 tablet (75 mg total) by mouth daily. 06/02/23   Von Grumbling, PA-C  cyanocobalamin  (VITAMIN B12) 1000 MCG tablet Take 1,000 mcg by mouth daily.    [provider]  Ferrous Sulfate Dried (HIGH POTENCY IRON) 65 MG TABS  07/17/22   [provider]  furosemide  (LASIX ) 20 MG tablet Take 1 tablet (20 mg total) by mouth daily as needed for edema. 12/27/22   Von Grumbling, PA-C  losartan  (COZAAR ) 25 MG tablet Take 1 tablet (25 mg total) by mouth daily. 09/30/22   Hugh Madura, MD  Multiple Vitamin (MULTIVITAMIN WITH MINERALS) TABS Take 1 tablet by mouth daily.    [provider]  nitroGLYCERIN  (NITROSTAT ) 0.4 MG SL tablet Place 1 tablet (0.4 mg total) under the tongue every 5 (five) minutes as needed for chest pain. 06/02/23 08/31/23  Von Grumbling, PA-C  pantoprazole  (PROTONIX ) 40 MG tablet Take 1 tablet (40 mg total) by mouth 2 (two) times daily. 08/04/23   Lajuan Pila, MD  potassium chloride  SA (KLOR-CON  M) 20 MEQ tablet Take 1 tablet (20 mEq total) by mouth 2 (two) times daily for 3 days, THEN 1 tablet (20 mEq total) daily. 01/06/23 01/04/24  Von Grumbling, PA-C  Probiotic Product (PROBIOTIC DAILY PO) Take 1 capsule by mouth daily.    [provider]   rosuvastatin  (CRESTOR ) 5 MG tablet Take 1 tablet (5 mg total) by mouth daily. 09/30/22   Hugh Madura, MD  verapamil  (CALAN -SR) 180 MG CR tablet Take 1 tablet (180 mg total) by mouth daily. 09/30/22   Von Grumbling, PA-C    No current facility-administered medications for  this encounter.   Current Outpatient Medications  Medication Sig Dispense Refill   clopidogrel  (PLAVIX ) 75 MG tablet Take 1 tablet (75 mg total) by mouth daily. 90 tablet 3   cyanocobalamin  (VITAMIN B12) 1000 MCG tablet Take 1,000 mcg by mouth daily.     Ferrous Sulfate Dried (HIGH POTENCY IRON) 65 MG TABS      furosemide  (LASIX ) 20 MG tablet Take 1 tablet (20 mg total) by mouth daily as needed for edema. 90 tablet 1   losartan  (COZAAR ) 25 MG tablet Take 1 tablet (25 mg total) by mouth daily. 90 tablet 3   Multiple Vitamin (MULTIVITAMIN WITH MINERALS) TABS Take 1 tablet by mouth daily.     nitroGLYCERIN  (NITROSTAT ) 0.4 MG SL tablet Place 1 tablet (0.4 mg total) under the tongue every 5 (five) minutes as needed for chest pain. 25 tablet 3   pantoprazole  (PROTONIX ) 40 MG tablet Take 1 tablet (40 mg total) by mouth 2 (two) times daily. 180 tablet 3   potassium chloride  SA (KLOR-CON  M) 20 MEQ tablet Take 1 tablet (20 mEq total) by mouth 2 (two) times daily for 3 days, THEN 1 tablet (20 mEq total) daily. 90 tablet 3   Probiotic Product (PROBIOTIC DAILY PO) Take 1 capsule by mouth daily.     rosuvastatin  (CRESTOR ) 5 MG tablet Take 1 tablet (5 mg total) by mouth daily. 90 tablet 3   verapamil  (CALAN -SR) 180 MG CR tablet Take 1 tablet (180 mg total) by mouth daily. 90 tablet 2    Allergies as of 09/01/2023 - Review Complete 09/01/2023  Allergen Reaction Noted   Augmentin  [amoxicillin -pot clavulanate] Diarrhea 01/29/2018   Metoprolol  Other (See Comments) 02/27/2022   Nsaids Other (See Comments) 04/24/2022     Review of Systems:    Constitutional: No weight loss, fever or chills Skin: No rash  Cardiovascular: No chest  pain Respiratory: No SOB Gastrointestinal: See HPI and otherwise negative Genitourinary: No dysuria  Neurological: +dizziness Musculoskeletal: No new muscle or joint pain Hematologic: No bruising Psychiatric: No history of depression or anxiety    Physical Exam:  Vital signs in last 24 hours: Temp:  [98 F (36.7 C)] 98 F (36.7 C) (06/09 0948) Pulse Rate:  [66-121] 84 (06/09 1030) Resp:  [16-19] 19 (06/09 1030) BP: (121-161)/(65-72) 121/65 (06/09 0950) SpO2:  [100 %] 100 % (06/09 1030)   General:   Pleasant elderly Caucasian female appears to be in NAD, Well developed, Well nourished, alert and cooperative Head:  Normocephalic and atraumatic. Eyes:   PEERL, EOMI. No icterus. Conjunctiva pink. Ears:  Normal auditory acuity. Neck:  Supple Throat: Oral cavity and pharynx without inflammation, swelling or lesion. Teeth in good condition. Lungs: Respirations even and unlabored. Lungs clear to auscultation bilaterally.   No wheezes, crackles, or rhonchi.  Heart: Normal S1, S2. No MRG. Regular rate and rhythm. No peripheral edema, cyanosis or pallor.  Abdomen:  Soft, nondistended, nontender. No rebound or guarding. Normal bowel sounds. No appreciable masses or hepatomegaly. Rectal: Maroon stool per ER Msk:  Symmetrical without gross deformities. Peripheral pulses intact.  Extremities:  Without edema, no deformity or joint abnormality. Normal ROM, normal sensation. Neurologic:  Alert and  oriented x4;  grossly normal neurologically. Skin:   Dry and intact without significant lesions or rashes. Psychiatric: Demonstrates good judgement and reason without abnormal affect or behaviors.   LAB RESULTS: Recent Labs    09/01/23 1030  WBC 5.5  HGB 11.3*  HCT 34.8*  PLT 284   BMET Recent  Labs    09/01/23 1030  NA 137  K 3.6  CL 100  CO2 25  GLUCOSE 113*  BUN 24*  CREATININE 1.66*  CALCIUM  9.2   LFT Recent Labs    09/01/23 1030  PROT 6.9  ALBUMIN 3.8  AST 18  ALT 11   ALKPHOS 78  BILITOT 0.6   STUDIES: DG Chest 2 View Result Date: 09/01/2023 CLINICAL DATA:  Shortness of breath EXAM: CHEST - 2 VIEW COMPARISON:  January 13, 2023 FINDINGS: The heart size and mediastinal contours are within normal limits. Both lungs are clear. The visualized skeletal structures are unremarkable. IMPRESSION: No active cardiopulmonary disease. Electronically Signed   By: Fredrich Jefferson M.D.   On: 09/01/2023 11:10    Impression / Plan:   Impression: 1.  GI bleed: With decreasing hemoglobin and elevated BUN, recent EGD and colonoscopy, EGD with multiple biopsies for Barretts esophagus, colonoscopy with removal of large polyp via hot snare in the transverse colon which is the most likely culprit 2.  CAD: on Plavix , last dose this morning  3. CKD stage 3 4. Barretts Esophagus  Plan: 1.  Plan for colonoscopy tomorrow with Dr. Karene Oto.  Patient be on a clear liquid diet today and n.p.o. at midnight.  Did discuss risks, benefits, limitations and alternatives and the patient agrees to proceed. 2.  Patient will have Suprep starting this afternoon in split dose fashion. 3.  Continue to monitor hemoglobin and transfusion as needed less than 7 4.  Hold Plavix  for now  Thank you for your kind consultation, we will continue to follow.  Kathy Parker Lemmon  09/01/2023, 12:45 PM

## 2023-09-01 NOTE — Anesthesia Preprocedure Evaluation (Signed)
 Anesthesia Evaluation  Patient identified by MRN, date of birth, ID band Patient awake    Reviewed: Allergy & Precautions, NPO status , Patient's Chart, lab work & pertinent test results  History of Anesthesia Complications Negative for: history of anesthetic complications  Airway Mallampati: I  TM Distance: >3 FB Neck ROM: Full    Dental no notable dental hx. (+) Teeth Intact   Pulmonary neg pulmonary ROS   Pulmonary exam normal breath sounds clear to auscultation       Cardiovascular hypertension, + CAD  Normal cardiovascular exam Rhythm:Regular Rate:Normal     Neuro/Psych   Anxiety      Neuromuscular disease    GI/Hepatic ,GERD  Medicated and Controlled,,  Endo/Other    Renal/GU Renal disease     Musculoskeletal  (+) Arthritis ,    Abdominal   Peds  Hematology  (+) Blood dyscrasia, anemia Lab Results      Component                Value               Date                      WBC                      6.1                 09/01/2023                HGB                      10.2 (L)            09/01/2023                HCT                      31.5 (L)            09/01/2023                MCV                      94.9                09/01/2023                PLT                      244                 09/01/2023           On plavix    Anesthesia Other Findings All: Metoprolol  Nsaids, augmentin   Reproductive/Obstetrics                             Anesthesia Physical Anesthesia Plan  ASA: 3  Anesthesia Plan: MAC   Post-op Pain Management: Minimal or no pain anticipated   Induction:   PONV Risk Score and Plan: Treatment may vary due to age or medical condition and Propofol  infusion  Airway Management Planned: Natural Airway and Nasal Cannula  Additional Equipment: None  Intra-op Plan:   Post-operative Plan:   Informed Consent: I have reviewed the patients History and  Physical, chart, labs and discussed the procedure including the risks,  benefits and alternatives for the proposed anesthesia with the patient or authorized representative who has indicated his/her understanding and acceptance.     Dental advisory given  Plan Discussed with: CRNA and Surgeon  Anesthesia Plan Comments: (Lower GI Bleed)        Anesthesia Quick Evaluation

## 2023-09-01 NOTE — ED Triage Notes (Signed)
 Pt arrives via POV. Pt reports reports she had a colonoscopy and EGD on 08/20/23. Pt states her provider stopped her plavix  for 5 days prior to the surgery and restarted her back on it 5 days after the procedure. Pt reports sob and dizziness for the last two days. Pt she has had some dark red blood in her stool since Friday as well. Reports internal hemorrhoids and that she had polyps removed. Pt is AxOx4.

## 2023-09-01 NOTE — Telephone Encounter (Signed)
 Pt stated that she has an EGD/Colon on 08/20/2023 and went a week with no BM. Pt stated she had a BM on 08/28/2023 which had BRB in it. Pt had called in on 08/29/2023 and spoke with a nurse. ( See Note Below) Pt stated that she has been having formed stools since that have been mixed with dark red blood in the stool along with dark red blood when she wipes. (2 BM yesterday and 1 BM today). No straining when having a BM. Pt stated that she went off her Plavix  for 5 days prior and she restarted it 5 days after her procedure on 08/25/2023. No pain, No nausea. Pt stated that the last several days that she has been lightheaded/dizzy  and having shortness of breath when walking. Pt stated that these symptoms are new for her and she did not have this prior.  Pt was notified that with her current symptoms that the recommendation would be to proceed to the ED for evaluation and assessment. Pt stated that she would go to [facility].  Pt verbalized understanding with all questions answered.  Routed as FYI

## 2023-09-01 NOTE — Telephone Encounter (Signed)
 Inbound call from patient, states she's been experiencing more rectal bleeding and its became "worse" over the weekend, patient would like to know if this is caused by the plavix  she is on, patient would like to discuss urgently with a nurse.

## 2023-09-02 ENCOUNTER — Observation Stay (HOSPITAL_COMMUNITY): Admitting: Certified Registered"

## 2023-09-02 ENCOUNTER — Encounter (HOSPITAL_COMMUNITY)
Admission: EM | Disposition: A | Discharge status: Home / Self Care | Payer: Self-pay | Source: Home / Self Care | Attending: Emergency Medicine

## 2023-09-02 ENCOUNTER — Encounter (HOSPITAL_COMMUNITY): Payer: Self-pay | Admitting: Internal Medicine

## 2023-09-02 ENCOUNTER — Observation Stay (HOSPITAL_BASED_OUTPATIENT_CLINIC_OR_DEPARTMENT_OTHER): Admitting: Certified Registered"

## 2023-09-02 DIAGNOSIS — K648 Other hemorrhoids: Secondary | ICD-10-CM

## 2023-09-02 DIAGNOSIS — K633 Ulcer of intestine: Secondary | ICD-10-CM | POA: Diagnosis not present

## 2023-09-02 DIAGNOSIS — K219 Gastro-esophageal reflux disease without esophagitis: Secondary | ICD-10-CM | POA: Diagnosis not present

## 2023-09-02 DIAGNOSIS — K921 Melena: Secondary | ICD-10-CM | POA: Diagnosis not present

## 2023-09-02 DIAGNOSIS — D62 Acute posthemorrhagic anemia: Secondary | ICD-10-CM

## 2023-09-02 DIAGNOSIS — K5730 Diverticulosis of large intestine without perforation or abscess without bleeding: Secondary | ICD-10-CM | POA: Diagnosis not present

## 2023-09-02 DIAGNOSIS — I2510 Atherosclerotic heart disease of native coronary artery without angina pectoris: Secondary | ICD-10-CM | POA: Diagnosis not present

## 2023-09-02 DIAGNOSIS — I700 Atherosclerosis of aorta: Secondary | ICD-10-CM

## 2023-09-02 DIAGNOSIS — I129 Hypertensive chronic kidney disease with stage 1 through stage 4 chronic kidney disease, or unspecified chronic kidney disease: Secondary | ICD-10-CM | POA: Diagnosis not present

## 2023-09-02 DIAGNOSIS — N183 Chronic kidney disease, stage 3 unspecified: Secondary | ICD-10-CM | POA: Diagnosis not present

## 2023-09-02 DIAGNOSIS — K2270 Barrett's esophagus without dysplasia: Secondary | ICD-10-CM | POA: Diagnosis not present

## 2023-09-02 DIAGNOSIS — R7989 Other specified abnormal findings of blood chemistry: Secondary | ICD-10-CM | POA: Diagnosis not present

## 2023-09-02 DIAGNOSIS — K922 Gastrointestinal hemorrhage, unspecified: Secondary | ICD-10-CM | POA: Diagnosis not present

## 2023-09-02 HISTORY — PX: COLONOSCOPY: SHX5424

## 2023-09-02 LAB — COMPREHENSIVE METABOLIC PANEL WITH GFR
ALT: 10 U/L (ref 0–44)
AST: 15 U/L (ref 15–41)
Albumin: 3.3 g/dL — ABNORMAL LOW (ref 3.5–5.0)
Alkaline Phosphatase: 68 U/L (ref 38–126)
Anion gap: 12 (ref 5–15)
BUN: 22 mg/dL (ref 8–23)
CO2: 26 mmol/L (ref 22–32)
Calcium: 8.9 mg/dL (ref 8.9–10.3)
Chloride: 102 mmol/L (ref 98–111)
Creatinine, Ser: 1.52 mg/dL — ABNORMAL HIGH (ref 0.44–1.00)
GFR, Estimated: 35 mL/min — ABNORMAL LOW (ref >60)
Glucose, Bld: 82 mg/dL (ref 70–99)
Potassium: 3.6 mmol/L (ref 3.5–5.1)
Sodium: 140 mmol/L (ref 135–145)
Total Bilirubin: 0.9 mg/dL (ref 0.0–1.2)
Total Protein: 6.2 g/dL — ABNORMAL LOW (ref 6.5–8.1)

## 2023-09-02 LAB — CBC
HCT: 32.4 % — ABNORMAL LOW (ref 36.0–46.0)
HCT: 32.5 % — ABNORMAL LOW (ref 36.0–46.0)
Hemoglobin: 10.4 g/dL — ABNORMAL LOW (ref 12.0–15.0)
Hemoglobin: 10.6 g/dL — ABNORMAL LOW (ref 12.0–15.0)
MCH: 30.9 pg (ref 26.0–34.0)
MCH: 30.5 pg (ref 26.0–34.0)
MCHC: 32.1 g/dL (ref 30.0–36.0)
MCHC: 32.6 g/dL (ref 30.0–36.0)
MCV: 96.1 fL (ref 80.0–100.0)
MCV: 93.4 fL (ref 80.0–100.0)
Platelets: 254 10*3/uL (ref 150–400)
Platelets: 265 10*3/uL (ref 150–400)
RBC: 3.37 MIL/uL — ABNORMAL LOW (ref 3.87–5.11)
RBC: 3.48 MIL/uL — ABNORMAL LOW (ref 3.87–5.11)
RDW: 14.9 % (ref 11.5–15.5)
RDW: 14.9 % (ref 11.5–15.5)
WBC: 6.1 10*3/uL (ref 4.0–10.5)
WBC: 5.7 10*3/uL (ref 4.0–10.5)
nRBC: 0 % (ref 0.0–0.2)
nRBC: 0 % (ref 0.0–0.2)

## 2023-09-02 SURGERY — COLONOSCOPY
Anesthesia: Monitor Anesthesia Care

## 2023-09-02 MED ORDER — PHENYLEPHRINE 80 MCG/ML (10ML) SYRINGE FOR IV PUSH (FOR BLOOD PRESSURE SUPPORT)
PREFILLED_SYRINGE | INTRAVENOUS | Status: DC | PRN
  Administered 2023-09-02: 120 ug via INTRAVENOUS
  Administered 2023-09-02 (×2): 80 ug via INTRAVENOUS
  Administered 2023-09-02: 160 ug via INTRAVENOUS
  Administered 2023-09-02: 120 ug via INTRAVENOUS
  Administered 2023-09-02 (×4): 80 ug via INTRAVENOUS

## 2023-09-02 MED ORDER — LIDOCAINE 2% (20 MG/ML) 5 ML SYRINGE
INTRAMUSCULAR | Status: DC | PRN
  Administered 2023-09-02: 50 mg via INTRAVENOUS

## 2023-09-02 MED ORDER — PROPOFOL 500 MG/50ML IV EMUL
INTRAVENOUS | Status: DC | PRN
  Administered 2023-09-02: 140 ug/kg/min via INTRAVENOUS

## 2023-09-02 MED ORDER — PHENYLEPHRINE HCL-NACL 20-0.9 MG/250ML-% IV SOLN
INTRAVENOUS | Status: DC | PRN
  Administered 2023-09-02: 50 ug/min via INTRAVENOUS

## 2023-09-02 MED ORDER — SODIUM CHLORIDE 0.9 % IV SOLN
INTRAVENOUS | Status: AC | PRN
  Administered 2023-09-02: 500 mL via INTRAVENOUS

## 2023-09-02 MED ORDER — PROPOFOL 1000 MG/100ML IV EMUL
INTRAVENOUS | Status: AC
  Filled 2023-09-02: qty 100

## 2023-09-02 MED ORDER — PROPOFOL 10 MG/ML IV BOLUS
INTRAVENOUS | Status: DC | PRN
  Administered 2023-09-02: 10 mg via INTRAVENOUS

## 2023-09-02 NOTE — Interval H&P Note (Signed)
 History and Physical Interval Note:  Completed bowel prep without issue.  No further bleeding overnight.  Repeat H/H largely stable at 10.6/32.5.  Plan to proceed today with colonoscopy for therapeutic intent.  09/02/2023 8:58 AM  Sara [name]  has presented today for surgery, with the diagnosis of Lower GI Bleed.  The various methods of treatment have been discussed with the patient and family. After consideration of risks, benefits and other options for treatment, the patient has consented to  Procedure(s): COLONOSCOPY (N/A) as a surgical intervention.  The patient's history has been reviewed, patient examined, no change in status, stable for surgery.  I have reviewed the patient's chart and labs.  Questions were answered to the patient's satisfaction.     Sara [name]

## 2023-09-02 NOTE — Anesthesia Postprocedure Evaluation (Signed)
 Anesthesia Post Note  Patient: Sara [name]  Procedure(s) Performed: COLONOSCOPY     Patient location during evaluation: Endoscopy Anesthesia Type: MAC Level of consciousness: awake and alert Pain management: pain level controlled Vital Signs Assessment: post-procedure vital signs reviewed and stable Respiratory status: spontaneous breathing, nonlabored ventilation, respiratory function stable and patient connected to nasal cannula oxygen Cardiovascular status: blood pressure returned to baseline and stable Postop Assessment: no apparent nausea or vomiting Anesthetic complications: no  No notable events documented.  Last Vitals:  Vitals:   09/02/23 1035 09/02/23 1056  BP: (!) 109/52 115/65  Pulse: 74 80  Resp: 17 16  Temp:  (!) 36.4 C  SpO2: 97% 100%    Last Pain:  Vitals:   09/02/23 1056  TempSrc: Oral  PainSc:                  Rosalita Combe

## 2023-09-02 NOTE — Discharge Summary (Signed)
 Physician Discharge Summary   Patient: Sara [name] MRN: [id number] DOB: [birth year]  Admit date:     09/01/2023  Discharge date: 09/02/23  Discharge Physician: Aneita Keens, MD   PCP: [name], [name], MD   Recommendations at discharge:  PCP visit for hospital follow-up and 1 week GI visit for repeat CBC in 7 to 10 days Hold Plavix  for the next 5 days  Discharge Diagnoses: Principal Problem:   Lower GI bleed Active Problems:   ABLA (acute blood loss anemia)   Diverticulosis of colon without hemorrhage   History of colonic polyps   Barrett's esophagus without dysplasia   Symptomatic anemia   Colon ulcer  Resolved Problems:   * No resolved hospital problems. *  Hospital Course: Sara [name] is a 78 y.o. female with a history of CAD, hypertension, GERD, hyperlipidemia.  Patient presented secondary to hematochezia in setting of recent colonoscopy with polypectomy complicated by Plavix  use. GI consulted and patient underwent colonoscopy with clips placed for two ulcers. Hemoglobin stable.  Assessment and Plan:  Hematochezia In setting of recent polypectomy with concern for bleeding from likely ulcer based complicated by Plavix  use GI consulted and performed a colonoscopy on 6/10 which was significant for 2 ulcers in the proximal descending colon and in the proximal transverse colon.  Clips were placed diverticulosis was also noted in the sigmoid, descending, ascending colon in addition to nonbleeding internal hemorrhoids.  Recommendation to hold Plavix  for 5 days.  Recommendation to have repeat CBC check in 7 to 10 days.  Patient to follow-up with GI as an outpatient.  No recurrent hematochezia prior to discharge  Acute blood loss anemia Baseline hemoglobin of 13.4 g/dL.  Acute blood loss secondary to hematochezia.  Hemoglobin of 11.3 g/dL on admission with slight downtrend to a low of 10.2 g/dL.  Hemoglobin stable at 10.6 g/dL on day of  discharge.  Hyperlipidemia Continue Crestor   Primary hypertension Losartan  held on admission secondary to low-normal blood pressure.  Blood pressure improved prior to discharge.  Continue verapamil .  Restart losartan  once blood pressure is persistently stable.  GERD Continue Protonix  on discharge  CAD Patient is on Plavix  as an outpatient. Held on discharge for 5 days post-colonoscopy.   Consultants: [facility] Procedures performed: Colonoscopy Disposition: Home Diet recommendation: Cardiac diet   DISCHARGE MEDICATION: Allergies as of 09/02/2023       Reactions   Augmentin  [amoxicillin -pot Clavulanate] Diarrhea   Metoprolol  Other (See Comments)   Had prior to cor CT, felt very dizzy   Nsaids Other (See Comments)   Avoid due to diminished kidney function        Medication List     PAUSE taking these medications    clopidogrel  75 MG tablet Wait to take this until: September 08, 2023 Morning Commonly known as: PLAVIX  Take 1 tablet (75 mg total) by mouth daily.   losartan  25 MG tablet Wait to take this until your doctor or other care provider tells you to start again. Commonly known as: COZAAR  Take 1 tablet (25 mg total) by mouth daily.       TAKE these medications    cyanocobalamin  1000 MCG tablet Commonly known as: VITAMIN B12 Take 1,000 mcg by mouth daily.   furosemide  20 MG tablet Commonly known as: LASIX  Take 1 tablet (20 mg total) by mouth daily as needed for edema.   High Potency Iron 65 MG Tabs   multivitamin with minerals Tabs tablet Take 1 tablet by mouth daily.  nitroGLYCERIN  0.4 MG SL tablet Commonly known as: NITROSTAT  Place 1 tablet (0.4 mg total) under the tongue every 5 (five) minutes as needed for chest pain.   pantoprazole  40 MG tablet Commonly known as: PROTONIX  Take 1 tablet (40 mg total) by mouth 2 (two) times daily.   potassium chloride  SA 20 MEQ tablet Commonly known as: KLOR-CON  M Take 1 tablet (20 mEq total) by  mouth 2 (two) times daily for 3 days, THEN 1 tablet (20 mEq total) daily. Start taking on: January 06, 2023   PROBIOTIC DAILY PO Take 1 capsule by mouth daily.   rosuvastatin  5 MG tablet Commonly known as: CRESTOR  Take 1 tablet (5 mg total) by mouth daily.   verapamil  180 MG CR tablet Commonly known as: CALAN -SR Take 1 tablet (180 mg total) by mouth daily.        Follow-up Information     [name], [name], MD. Schedule an appointment as soon as possible for a visit in 1 week(s).   Specialty: Family Medicine Why: For hospital follow-up Contact information: [address] [place] [address] [phone]         Mansouraty, Albino Alu., MD Follow up in 1 week(s).   Specialties: Gastroenterology, Internal Medicine Why: Complete blood count Contact information: [address] [place] [address] [phone]                Discharge Exam: BP 115/65 (BP Location: Left Arm)   Pulse 80   Temp (!) 97.5 F (36.4 C) (Oral)   Resp 16   Ht 5\' 4"  (1.626 m)   Wt 63.4 kg   LMP  (LMP Unknown)   SpO2 100%   BMI 23.99 kg/m   General exam: Appears calm and comfortable Respiratory system: Clear to auscultation. Respiratory effort normal. Cardiovascular system: S1 & S2 heard, RRR. No murmurs, rubs, gallops or clicks. Gastrointestinal system: Abdomen is nondistended, soft and nontender. Normal bowel sounds heard. Central nervous system: Alert and oriented. No focal neurological deficits. Musculoskeletal: No edema. No calf tenderness Psychiatry: Judgement and insight appear normal. Mood & affect appropriate.   Condition at discharge: stable  The results of significant diagnostics from this hospitalization (including imaging, microbiology, ancillary and laboratory) are listed below for reference.   Imaging Studies: DG Chest 2 View Result Date: 09/01/2023 CLINICAL DATA:  Shortness of breath EXAM: CHEST - 2 VIEW COMPARISON:  January 13, 2023 FINDINGS: The heart  size and mediastinal contours are within normal limits. Both lungs are clear. The visualized skeletal structures are unremarkable. IMPRESSION: No active cardiopulmonary disease. Electronically Signed   By: Fredrich Jefferson M.D.   On: 09/01/2023 11:10    Microbiology: Results for orders placed or performed in visit on 06/09/23  Urine Culture     Status: None   Collection Time: 06/09/23  1:57 PM   Specimen: Urine   UR  Result Value Ref Range Status   Urine Culture, Routine Final report  Final   Organism ID, Bacteria Comment  Final    Comment: Mixed urogenital flora Less than 10,000 colonies/mL     Labs: CBC: Recent Labs  Lab 09/01/23 1030 09/01/23 1437 09/02/23 0514 09/02/23 0759  WBC 5.5 6.1 6.1 5.7  NEUTROABS  --  3.6  --   --   HGB 11.3* 10.2* 10.4* 10.6*  HCT 34.8* 31.5* 32.4* 32.5*  MCV 93.5 94.9 96.1 93.4  PLT 284 244 254 265   Basic Metabolic Panel: Recent Labs  Lab 09/01/23 1030 09/02/23 0514  NA 137 140  K 3.6 3.6  CL 100 102  CO2 25 26  GLUCOSE 113* 82  BUN 24* 22  CREATININE 1.66* 1.52*  CALCIUM  9.2 8.9   Liver Function Tests: Recent Labs  Lab 09/01/23 1030 09/02/23 0514  AST 18 15  ALT 11 10  ALKPHOS 78 68  BILITOT 0.6 0.9  PROT 6.9 6.2*  ALBUMIN 3.8 3.3*    Discharge time spent: 35 minutes.  Signed: Aneita Keens, MD [facility] 09/02/2023

## 2023-09-02 NOTE — Care Management Obs Status (Signed)
 MEDICARE OBSERVATION STATUS NOTIFICATION   Patient Details  Name: Sara [name] MRN: [id number] Date of Birth: Jan 17, 1946   Medicare Observation Status Notification Given:  Yes    Zenon Hilda, LCSW 09/02/2023, 1:23 PM

## 2023-09-02 NOTE — Anesthesia Procedure Notes (Signed)
 Procedure Name: MAC Date/Time: 09/02/2023 9:04 AM  Performed by: Alwyn Juba, CRNAPre-anesthesia Checklist: Patient identified, Emergency Drugs available, Suction available, Patient being monitored and Timeout performed Oxygen Delivery Method: Simple face mask Placement Confirmation: positive ETCO2

## 2023-09-02 NOTE — Transfer of Care (Signed)
 Immediate Anesthesia Transfer of Care Note  Patient: Sara [name]  Procedure(s) Performed: COLONOSCOPY  Patient Location: PACU and Endoscopy Unit  Anesthesia Type:MAC  Level of Consciousness: awake, alert , and patient cooperative  Airway & Oxygen Therapy: Patient Spontanous Breathing and Patient connected to face mask oxygen  Post-op Assessment: Report given to RN, treated hypotension at bedside, Dr Lasalle Pointer informed and at bedside as we give fluid and titrate neosynephrine  Post vital signs: Reviewed  Last Vitals:  Vitals Value Taken Time  BP 108/44 09/02/23 0946  Temp    Pulse 64 09/02/23 0949  Resp 18 09/02/23 0949  SpO2 100 % 09/02/23 0949  Vitals shown include unfiled device data.  Last Pain:  Vitals:   09/02/23 0841  TempSrc: Temporal  PainSc: 0-No pain      Patients Stated Pain Goal: 0 (09/01/23 1656)  Complications: No notable events documented.

## 2023-09-02 NOTE — Discharge Instructions (Signed)
 Sara [name],  You were in the hospital because of GI bleeding related to your recent colonoscopy and polypectomy. This was complicated by your Plavix  use. You were seen by your GI doctor her performed a colonoscopy and treated the bleeding ulcers that were present. He wants you to hold Plavix  for 5 days and have a repeat blood count in 7-10 days.

## 2023-09-02 NOTE — Hospital Course (Addendum)
 Sara [name] is a 78 y.o. female with a history of CAD, hypertension, GERD, hyperlipidemia.  Patient presented secondary to hematochezia in setting of recent colonoscopy with polypectomy complicated by Plavix  use. GI consulted and patient underwent colonoscopy with clips placed for two ulcers. Hemoglobin stable.

## 2023-09-02 NOTE — Progress Notes (Signed)
   09/02/23 1332  TOC Brief Assessment  Insurance and Status Reviewed  Patient has primary care physician Yes  Home environment has been reviewed Resides in an apartment with relatives  Prior level of function: Independent with ADLs at baseline  Prior/Current Home Services No current home services  Social Drivers of Health Review SDOH reviewed no interventions necessary  Readmission risk has been reviewed Yes  Transition of care needs no transition of care needs at this time

## 2023-09-02 NOTE — Op Note (Signed)
 [facility] Patient Name: Sara [name] Procedure Date: 09/02/2023 MRN: [id number] Attending MD: Harry Lindau , MD, [id number] Date of Birth: 04-25-1945 CSN: [id number] Age: 78 Admit Type: Inpatient Procedure:                Colonoscopy Indications:              Hematochezia, Acute post hemorrhagic anemia                           78 year old female with medical history as outlined                            below, presents with hematochezia and acute blood                            loss anemia after recent EGD/colonoscopy. Underwent                            colonoscopy in 08/12/2023 which was notable for 15                            mm proximal transverse colon polyp removed via hot                            snare along with two 6-10 mm polyps removed from                            the proximal and mid descending colon via cold                            snare, pandiverticulosis with mild SCAD in the                            sigmoid, and internal hemorrhoids. EGD that same                            day with long segment nondysplastic Barrett's                            Esophagus, large six centimeter hiatal hernia with                            evidence of prior fundoplication. She had held her                            Plavix  for 5 days after procedures without any                            bleeding. Resumed her Plavix  and shortly after                            noticed hematochezia. Presented with hemoglobin 11  from baseline 13. Providers:                Harry Lindau, MD, Marden Shaggy, RN, Marvelyn Slim, Technician Referring MD:              Medicines:                Monitored Anesthesia Care Complications:            No immediate complications. Estimated Blood Loss:     Estimated blood loss: none. Procedure:                Pre-Anesthesia Assessment:                           - Prior to the  procedure, a History and Physical                            was performed, and patient medications and                            allergies were reviewed. The patient's tolerance of                            previous anesthesia was also reviewed. The risks                            and benefits of the procedure and the sedation                            options and risks were discussed with the patient.                            All questions were answered, and informed consent                            was obtained. Prior Anticoagulants: The patient has                            taken Plavix  (clopidogrel ), last dose was 1 day                            prior to procedure. ASA Grade Assessment: III - A                            patient with severe systemic disease. After                            reviewing the risks and benefits, the patient was                            deemed in satisfactory condition to undergo the  procedure.                           After obtaining informed consent, the colonoscope                            was passed under direct vision. Throughout the                            procedure, the patient's blood pressure, pulse, and                            oxygen saturations were monitored continuously. The                            CF-HQ190L ([id number]) Olympus colonoscope was                            introduced through the anus and advanced to the the                            terminal ileum. The colonoscopy was performed                            without difficulty. The patient tolerated the                            procedure well. The quality of the bowel                            preparation was good. The terminal ileum, ileocecal                            valve, appendiceal orifice, and rectum were                            photographed. Scope In: 9:12:53 AM Scope Out: 9:34:10 AM Scope Withdrawal Time: 0 hours 16  minutes 0 seconds  Total Procedure Duration: 0 hours 21 minutes 17 seconds  Findings:      The perianal and digital rectal examinations were normal.      Two post polypectomy ulcers were found in the proximal descending colon       x1 and in the proximal transverse colon x1. Ulcers were 4-5 mm in size.       No bleeding was present and no high grade stigmata. Based on clinical       presentation, endoscopic appearance, recent bleed, and plan to resume       antiplatelet therapy, I elected to perform endoscopic intervention. For       hemostasis, two hemostatic clips were successfully placed (MR       conditional). Clip manufacturer: [employer]. There was no       bleeding at the end of the procedure.      Multiple medium-mouthed and small-mouthed diverticula were found in the       sigmoid colon, descending colon and ascending colon. Lavage of the area  was performed using sterile water, resulting in clearance with good       visualization. There was a mild amount of inflammatory changes in the       sigmoid colon, which was also seen on the recent colonoscopy and favored       to be consistent with mild SCAD. The mucosa was otherwise normal       throughout the remainder of the colon. No blood in the GI lumen.      Non-bleeding internal hemorrhoids were found during retroflexion. The       hemorrhoids were small.      The terminal ileum appeared normal. Impression:               - Two ulcers in the proximal descending colon and                            in the proximal transverse colon. Clips (MR                            conditional) were placed. Clip manufacturer: [employer].                           - Diverticulosis in the sigmoid colon, in the                            descending colon and in the ascending colon.                           - Non-bleeding internal hemorrhoids.                           - The examined portion of the  ileum was normal.                           - No specimens collected. Moderate Sedation:      Not Applicable - Patient had care per Anesthesia. Recommendation:           - Return patient to hospital ward for possible                            discharge same day.                           - Advance diet as tolerated.                           - Resume Plavix  (clopidogrel ) at prior dose in 5                            days.                           - Repeat CBC check 7-10 days after hospital  discharge to ensure returning to baseline.                           - Will coordinate outpatient follow-up in the GI                            clinic. Procedure Code(s):        --- Professional ---                           [phone], Colonoscopy, flexible; with control of                            bleeding, any method Diagnosis Code(s):        --- Professional ---                           K63.3, Ulcer of intestine                           K64.8, Other hemorrhoids                           K92.1, Melena (includes Hematochezia)                           D62, Acute posthemorrhagic anemia                           K57.30, Diverticulosis of large intestine without                            perforation or abscess without bleeding CPT copyright 2022 American Medical Association. All rights reserved. The codes documented in this report are preliminary and upon coder review may  be revised to meet current compliance requirements. Harry Lindau, MD 09/02/2023 10:00:23 AM Number of Addenda: 0

## 2023-09-03 ENCOUNTER — Other Ambulatory Visit: Payer: Self-pay

## 2023-09-03 ENCOUNTER — Telehealth: Payer: Self-pay | Admitting: Gastroenterology

## 2023-09-03 DIAGNOSIS — D509 Iron deficiency anemia, unspecified: Secondary | ICD-10-CM

## 2023-09-03 DIAGNOSIS — Z8719 Personal history of other diseases of the digestive system: Secondary | ICD-10-CM

## 2023-09-03 DIAGNOSIS — K922 Gastrointestinal hemorrhage, unspecified: Secondary | ICD-10-CM

## 2023-09-03 NOTE — Telephone Encounter (Signed)
 Pt was notified that labs were ordered for her for 7-10 days. Location to the lab provided.  Pt was scheduled for an office visit with Dr. Venice Gillis on 10/14/2023 at 8:50 AM. Pt made aware.  Pt verbalized understanding with all questions answered.

## 2023-09-03 NOTE — Telephone Encounter (Signed)
 Inbound call from patient stating she was advised yesterday after procedure to follow up within 7-10 days to complete labs. Patient is requesting to know if she needs an appointment. Requesting a call back. Please advise, thank you

## 2023-09-05 ENCOUNTER — Encounter (HOSPITAL_COMMUNITY): Payer: Self-pay | Admitting: Gastroenterology

## 2023-09-06 ENCOUNTER — Other Ambulatory Visit (HOSPITAL_BASED_OUTPATIENT_CLINIC_OR_DEPARTMENT_OTHER): Payer: Self-pay

## 2023-09-06 MED FILL — Losartan Potassium Tab 25 MG: ORAL | 90 days supply | Qty: 90 | Fill #3 | Status: AC

## 2023-09-06 MED FILL — Losartan Potassium Tab 25 MG: ORAL | 90 days supply | Qty: 90 | Fill #3 | Status: CN

## 2023-09-07 ENCOUNTER — Ambulatory Visit: Payer: Self-pay | Admitting: Gastroenterology

## 2023-09-08 ENCOUNTER — Other Ambulatory Visit (HOSPITAL_BASED_OUTPATIENT_CLINIC_OR_DEPARTMENT_OTHER): Payer: Self-pay | Admitting: Physician Assistant

## 2023-09-08 DIAGNOSIS — I471 Supraventricular tachycardia, unspecified: Secondary | ICD-10-CM

## 2023-09-08 DIAGNOSIS — I4719 Other supraventricular tachycardia: Secondary | ICD-10-CM

## 2023-09-09 ENCOUNTER — Other Ambulatory Visit (INDEPENDENT_AMBULATORY_CARE_PROVIDER_SITE_OTHER)

## 2023-09-09 DIAGNOSIS — Z8719 Personal history of other diseases of the digestive system: Secondary | ICD-10-CM

## 2023-09-09 DIAGNOSIS — D509 Iron deficiency anemia, unspecified: Secondary | ICD-10-CM

## 2023-09-09 DIAGNOSIS — K922 Gastrointestinal hemorrhage, unspecified: Secondary | ICD-10-CM | POA: Diagnosis not present

## 2023-09-09 LAB — CBC
HCT: 31.3 % — ABNORMAL LOW (ref 36.0–46.0)
Hemoglobin: 10.5 g/dL — ABNORMAL LOW (ref 12.0–15.0)
MCHC: 33.6 g/dL (ref 30.0–36.0)
MCV: 93 fl (ref 78.0–100.0)
Platelets: 279 10*3/uL (ref 150.0–400.0)
RBC: 3.36 Mil/uL — ABNORMAL LOW (ref 3.87–5.11)
RDW: 14.9 % (ref 11.5–15.5)
WBC: 6.2 10*3/uL (ref 4.0–10.5)

## 2023-09-10 ENCOUNTER — Other Ambulatory Visit (HOSPITAL_BASED_OUTPATIENT_CLINIC_OR_DEPARTMENT_OTHER): Payer: Self-pay

## 2023-09-10 MED ORDER — VERAPAMIL HCL ER 180 MG PO TBCR
180.0000 mg | EXTENDED_RELEASE_TABLET | Freq: Every day | ORAL | 2 refills | Status: AC
  Filled 2023-09-10: qty 90, 90d supply, fill #0
  Filled 2023-12-08: qty 90, 90d supply, fill #1
  Filled 2024-03-06: qty 90, 90d supply, fill #2

## 2023-09-12 ENCOUNTER — Ambulatory Visit: Payer: Self-pay | Admitting: Gastroenterology

## 2023-09-15 ENCOUNTER — Ambulatory Visit (INDEPENDENT_AMBULATORY_CARE_PROVIDER_SITE_OTHER): Admitting: Family Medicine

## 2023-09-15 ENCOUNTER — Encounter (HOSPITAL_BASED_OUTPATIENT_CLINIC_OR_DEPARTMENT_OTHER): Payer: Self-pay | Admitting: Family Medicine

## 2023-09-15 VITALS — BP 101/63 | HR 79 | Ht 66.0 in | Wt 141.2 lb

## 2023-09-15 DIAGNOSIS — D649 Anemia, unspecified: Secondary | ICD-10-CM

## 2023-09-15 DIAGNOSIS — I10 Essential (primary) hypertension: Secondary | ICD-10-CM

## 2023-09-15 DIAGNOSIS — N1832 Chronic kidney disease, stage 3b: Secondary | ICD-10-CM

## 2023-09-15 NOTE — Patient Instructions (Signed)

## 2023-09-15 NOTE — Assessment & Plan Note (Signed)
 Blood pressure slightly low in office today, recommend continuing to hold losartan  which was held at time of discharge.  Recommend intermittent monitoring of blood pressure at home

## 2023-09-15 NOTE — Assessment & Plan Note (Signed)
 Labs completed last week, anemia stable from time of discharge. No symptoms concerning for further blood loss.  Can continue with monitoring and scheduled follow-up with hematology.  No additional labs completed today

## 2023-09-15 NOTE — Progress Notes (Signed)
    Procedures performed today:    None.  Independent interpretation of notes and tests performed by another provider:   None.  Brief History, Exam, Impression, and Recommendations:    BP 101/63 (BP Location: Left Arm, Patient Position: Sitting, Cuff Size: Normal)   Pulse 79   Ht 5' 6 (1.676 m)   Wt 141 lb 3.2 oz (64 kg)   LMP  (LMP Unknown)   SpO2 100%   BMI 22.79 kg/m   Patient had recent hospitalization due to rectal bleeding.  She did have moderate anemia noted during hospitalization, this did remain stable at time of discharge. Additionally had AKI on CKD, this was mild in the hospital. Has been doing well since discharge.  No further bleeding noted.  Anemia, unspecified type Assessment & Plan: Labs completed last week, anemia stable from time of discharge. No symptoms concerning for further blood loss.  Can continue with monitoring and scheduled follow-up with hematology.  No additional labs completed today   Stage 3b chronic kidney disease (HCC) Assessment & Plan: History of CKD, did have mild AKI on CKD during hospitalization.  We will proceed with labs as below.  Orders: -     Basic metabolic panel with GFR  Primary hypertension Assessment & Plan: Blood pressure slightly low in office today, recommend continuing to hold losartan  which was held at time of discharge.  Recommend intermittent monitoring of blood pressure at home   Return in about 4 months (around 01/15/2024).   ___________________________________________ [name], MD, ABFM, CAQSM Primary Care and Sports Medicine [facility]

## 2023-09-15 NOTE — Assessment & Plan Note (Signed)
 History of CKD, did have mild AKI on CKD during hospitalization.  We will proceed with labs as below.

## 2023-09-16 LAB — BASIC METABOLIC PANEL WITH GFR
BUN/Creatinine Ratio: 17 (ref 12–28)
BUN: 25 mg/dL (ref 8–27)
CO2: 21 mmol/L (ref 20–29)
Calcium: 9.4 mg/dL (ref 8.7–10.3)
Chloride: 99 mmol/L (ref 96–106)
Creatinine, Ser: 1.45 mg/dL — ABNORMAL HIGH (ref 0.57–1.00)
Glucose: 97 mg/dL (ref 70–99)
Potassium: 3.9 mmol/L (ref 3.5–5.2)
Sodium: 137 mmol/L (ref 134–144)
eGFR: 37 mL/min/{1.73_m2} — ABNORMAL LOW (ref >59)

## 2023-09-17 ENCOUNTER — Ambulatory Visit (HOSPITAL_BASED_OUTPATIENT_CLINIC_OR_DEPARTMENT_OTHER): Payer: Self-pay | Admitting: Family Medicine

## 2023-09-22 ENCOUNTER — Ambulatory Visit: Attending: Cardiology | Admitting: Cardiology

## 2023-09-22 ENCOUNTER — Encounter: Payer: Self-pay | Admitting: Cardiology

## 2023-09-22 VITALS — BP 100/58 | HR 82 | Resp 16 | Ht 66.0 in | Wt 134.0 lb

## 2023-09-22 DIAGNOSIS — I700 Atherosclerosis of aorta: Secondary | ICD-10-CM | POA: Diagnosis not present

## 2023-09-22 DIAGNOSIS — I2510 Atherosclerotic heart disease of native coronary artery without angina pectoris: Secondary | ICD-10-CM

## 2023-09-22 DIAGNOSIS — I10 Essential (primary) hypertension: Secondary | ICD-10-CM

## 2023-09-22 DIAGNOSIS — I471 Supraventricular tachycardia, unspecified: Secondary | ICD-10-CM

## 2023-09-22 NOTE — Patient Instructions (Signed)
 Medication Instructions:  The current medical regimen is effective;  continue present plan and medications.  *If you need a refill on your cardiac medications before your next appointment, please call your pharmacy*  Follow-Up: At [facility], you and your health needs are our priority.  As part of our continuing mission to provide you with exceptional heart care, our providers are all part of one team.  This team includes your primary Cardiologist (physician) and Advanced Practice Providers or APPs (Physician Assistants and Nurse Practitioners) who all work together to provide you with the care you need, when you need it.  Your next appointment:   1 year(s)  Provider:   Orren Fabry, PA-C          We recommend signing up for the patient portal called MyChart.  Sign up information is provided on this After Visit Summary.  MyChart is used to connect with patients for Virtual Visits (Telemedicine).  Patients are able to view lab/test results, encounter notes, upcoming appointments, etc.  Non-urgent messages can be sent to your provider as well.   To learn more about what you can do with MyChart, go to [email or web address].

## 2023-09-22 NOTE — Progress Notes (Signed)
 Cardiology Office Note:  .   Date:  09/22/2023  ID:  Sara [name], DOB 10-Jul-1945, MRN [id number] PCP: [name], Raymond J, MD  [facility] Providers Cardiologist:  Oneil Parchment, MD Electrophysiologist:  Elspeth Sage, MD     History of Present Illness: .   Sara [name] is a 78 y.o. female Discussed the use of AI scribe software for clinical note transcription with the patient, who gave verbal consent to proceed.  History of Present Illness Sara [name] is a 78 year old female with coronary artery disease who presents for follow-up after recent cardiac catheterization.  She underwent cardiac catheterization on April 29, 2022, resulting in the placement of a single vessel stent to a 99% mid RCA lesion. The stent used was a Synergy 3.0 by 32 mm. An echocardiogram on January 15, 2023, showed an ejection fraction of 65%, grade one diastolic dysfunction, pulmonary pressures of 17 mmHg, mild mitral regurgitation, and an aortic measurement of 39 mm. She has a history of an abnormal cardiac PET scan in January 2024, which showed evidence of ischemia in the inferior distribution. Her current medications include Plavix  75 mg daily, furosemide  20 mg as needed, Crestor  5 mg daily, verapamil  180 mg CR daily, and losartan  25 mg, which has been paused in the past due to low blood pressure.  She has a history of a hiatal hernia, aortic atherosclerosis, and chronic kidney disease stage 3A. Her palpitations are controlled with verapamil . She has experienced shortness of breath in the past. Hypokalemia is managed with potassium supplementation.  She was admitted to the hospital on September 01, 2023, with rectal bleeding and underwent a colonoscopy, which revealed moderate anemia. Clips were placed at two ulcers in the proximal and descending colon. After restarting Plavix , she experienced bloody stools, which worsened over the weekend, leading to shortness of breath and another hospital visit.  A subsequent colonoscopy was performed, and clamps were placed.  She has a history of a fall a few years ago, resulting in left shoulder pain, which was exacerbated after a COVID shot in November. She reports weight loss without trying and has been experiencing laryngitis and shortness of breath, which she associates with her GERD and large hiatal hernia. She is currently taking pantoprazole , which she finds helpful.      ROS: No CP  Studies Reviewed: .        Results LABS Hb: 10.5 (09/09/2023) Cr: 1.5 (2025) LDL: 45 (2025)  DIAGNOSTIC Cardiac catheterization: Single vessel stent to 99% mid RCA lesion, Synergy 3.0 by 32 mm stent (04/29/2022) Echocardiogram: EF 65%, grade 1 diastolic dysfunction, pulmonary pressures 17 mmHg, mild mitral regurgitation, borderline aortic dilatation 39 mm (01/15/2023) Cardiac PET scan: Evidence of ischemia in the inferior distribution (03/2022) Colonoscopy: Clips placed at two ulcers in the proximal and descending colon (09/01/2023) EKG: Normal (2025) Risk Assessment/Calculations:            Physical Exam:   VS:  BP (!) 100/58 (BP Location: Right Arm, Patient Position: Sitting, Cuff Size: Normal)   Pulse 82   Resp 16   Ht 5' 6 (1.676 m)   Wt 134 lb (60.8 kg)   LMP  (LMP Unknown)   SpO2 97%   BMI 21.63 kg/m    Wt Readings from Last 3 Encounters:  09/22/23 134 lb (60.8 kg)  09/15/23 141 lb 3.2 oz (64 kg)  09/01/23 139 lb 12.4 oz (63.4 kg)    GEN: Well nourished, well developed in no  acute distress NECK: No JVD; No carotid bruits CARDIAC: RRR, no murmurs, no rubs, no gallops RESPIRATORY:  Clear to auscultation without rales, wheezing or rhonchi  ABDOMEN: Soft, non-tender, non-distended EXTREMITIES:  No edema; No deformity   ASSESSMENT AND PLAN: .    Assessment and Plan Assessment & Plan Coronary artery disease with stent placement Coronary artery disease status post single vessel stent placement in the mid RCA for a 99% lesion.  Currently on Plavix  monotherapy. No recent episodes of ischemia or chest pain. LDL is well controlled at 45 mg/dL. No aspirin  is recommended due to previous bleeding episodes. - Continue Plavix  75 mg daily - Discontinue aspirin  - Continue Crestor  5 mg daily  Palpitations controlled with verapamil  Palpitations are well controlled with verapamil . No evidence of atrial fibrillation on recent EKG. Heart rate is stable. - Continue verapamil  SR 180 mg daily  Primary hypertension Primary hypertension with previously low blood pressure readings. Losartan  was paused due to low pressures and will remain discontinued. Blood pressure is currently stable on verapamil . - Continue verapamil  180 mg daily - Do not restart losartan   Chronic kidney disease stage 3A Chronic kidney disease stage 3A with a stable creatinine level of 1.5 mg/dL. No acute changes noted.  Rectal bleeding with diverticulosis Recent rectal bleeding episodes with hospitalization. Colonoscopy revealed ulcers in the proximal and descending colon, treated with clips. Hemoglobin is slightly reduced at 10.5 g/dL but stable. No transfusion required. Bleeding episodes have stabilized. - Discuss any new symptoms with GI specialist  Hiatal hernia Large hiatal hernia noted. GERD symptoms are managed with pantoprazole . No surgical intervention planned due to age and current management efficacy. - Continue pantoprazole  - Elevate pillow during sleep to reduce GERD symptoms  Gastroesophageal reflux disease (GERD) GERD symptoms are present, potentially contributing to laryngitis and shortness of breath. Managed with pantoprazole , which does not interfere with Plavix  efficacy. - Continue pantoprazole  - Discuss GERD symptoms with GI specialist if she persists  Hypokalemia Hypokalemia managed with potassium supplementation. No acute issues noted.           Signed, Oneil Parchment, MD

## 2023-09-29 ENCOUNTER — Ambulatory Visit: Payer: Self-pay

## 2023-09-29 NOTE — Telephone Encounter (Signed)
 Patient scheduled for 9:50 at 7/8 with de peru

## 2023-09-29 NOTE — Telephone Encounter (Signed)
 FYI Only or Action Required?: FYI only for provider.  Patient was last seen in primary care on 09/15/2023 by [name], [name], MD. Called Nurse Triage reporting Shoulder Pain. Symptoms began several months ago. Interventions attempted: Prescription medications: Voltaren  and Rest, hydration, or home remedies. Symptoms are: gradually worsening.  Triage Disposition: See PCP When Office is Open (Within 3 Days)  Patient/caregiver understands and will follow disposition?: Yes      Pain in shoulder.. popping off and on.. worsening    Reason for Disposition  [1] MODERATE pain (e.g., interferes with normal activities) AND [2] present > 3 days  Answer Assessment - Initial Assessment Questions 1. ONSET: When did the pain start?     Started in November following post Covid immunization, worsening x 3-4 months 2. LOCATION: Where is the pain located?     Left shoulder 3. PAIN: How bad is the pain? (Scale 1-10; or mild, moderate, severe)   - MILD (1-3): doesn't interfere with normal activities   - MODERATE (4-7): interferes with normal activities (e.g., work or school) or awakens from sleep   - SEVERE (8-10): excruciating pain, unable to do any normal activities, unable to move arm at all due to pain     4/10, 10/10 at worst 4. WORK OR EXERCISE: Has there been any recent work or exercise that involved this part of the body?     None 5. CAUSE: What do you think is causing the shoulder pain?     Unknown 6. OTHER SYMPTOMS: Do you have any other symptoms? (e.g., neck pain, swelling, rash, fever, numbness, weakness)     None  Protocols used: Shoulder Pain-A-AH

## 2023-09-30 ENCOUNTER — Encounter (HOSPITAL_BASED_OUTPATIENT_CLINIC_OR_DEPARTMENT_OTHER): Payer: Self-pay | Admitting: Family Medicine

## 2023-09-30 ENCOUNTER — Ambulatory Visit (HOSPITAL_BASED_OUTPATIENT_CLINIC_OR_DEPARTMENT_OTHER): Payer: Self-pay | Admitting: Family Medicine

## 2023-09-30 ENCOUNTER — Ambulatory Visit (HOSPITAL_BASED_OUTPATIENT_CLINIC_OR_DEPARTMENT_OTHER): Admitting: Family Medicine

## 2023-09-30 ENCOUNTER — Ambulatory Visit (INDEPENDENT_AMBULATORY_CARE_PROVIDER_SITE_OTHER)

## 2023-09-30 VITALS — BP 112/64 | HR 94 | Ht 66.0 in | Wt 139.2 lb

## 2023-09-30 DIAGNOSIS — M25512 Pain in left shoulder: Secondary | ICD-10-CM | POA: Diagnosis not present

## 2023-09-30 NOTE — Assessment & Plan Note (Signed)
 Patient reports that she has been experiencing left shoulder pain over the past few weeks.  She does not recall any specific injury to trigger symptoms.  She does recall remote history of left shoulder injury about 14 years ago.  Pain primarily over the lateral aspect of shoulder.  Some reduced range of motion.  Pain with external rotation, abduction.  Occasionally she will have pain radiate down her arm.  No associated numbness or tingling. On exam, Left shoulder: Obvious swelling, bruising or erythema: absent Deformity of the shoulder: absent Active ROM: diminished range with pain Passive ROM: diminished range with pain Strength: normal/normal Empty can: Positive Hawkins: Positive Neer's: Negative Neurovascular exam: intact  Discussed potential causes for ongoing pain.  Discussed potential for underlying osteoarthritis, subacromial impingement, rotator cuff injury, adhesive capsulitis.  At this time, I do feel that subacromial impingement/bursitis and adhesive capsulitis are more likely causes, less likely specific rotator cuff injury. At this time we will proceed with x-rays.  She would also be open to physical therapy, referral placed today.  We discussed consideration for corticosteroid injection.  We will hold off on this for now, but consider in the future pending progress with above X-ray imaging completed and reviewed today.  No significant abnormality observed, no abnormal calcifications.

## 2023-09-30 NOTE — Progress Notes (Signed)
    Procedures performed today:    None.  Independent interpretation of notes and tests performed by another provider:   None.  Brief History, Exam, Impression, and Recommendations:    BP 112/64 (BP Location: Right Arm, Patient Position: Sitting, Cuff Size: Normal)   Pulse 94   Ht 5' 6 (1.676 m)   Wt 139 lb 3.2 oz (63.1 kg)   LMP  (LMP Unknown)   SpO2 100%   BMI 22.47 kg/m   Left shoulder pain, unspecified chronicity Assessment & Plan: Patient reports that she has been experiencing left shoulder pain over the past few weeks.  She does not recall any specific injury to trigger symptoms.  She does recall remote history of left shoulder injury about 14 years ago.  Pain primarily over the lateral aspect of shoulder.  Some reduced range of motion.  Pain with external rotation, abduction.  Occasionally she will have pain radiate down her arm.  No associated numbness or tingling. On exam, Left shoulder: Obvious swelling, bruising or erythema: absent Deformity of the shoulder: absent Active ROM: diminished range with pain Passive ROM: diminished range with pain Strength: normal/normal Empty can: Positive Hawkins: Positive Neer's: Negative Neurovascular exam: intact  Discussed potential causes for ongoing pain.  Discussed potential for underlying osteoarthritis, subacromial impingement, rotator cuff injury, adhesive capsulitis.  At this time, I do feel that subacromial impingement/bursitis and adhesive capsulitis are more likely causes, less likely specific rotator cuff injury. At this time we will proceed with x-rays.  She would also be open to physical therapy, referral placed today.  We discussed consideration for corticosteroid injection.  We will hold off on this for now, but consider in the future pending progress with above X-ray imaging completed and reviewed today.  No significant abnormality observed, no abnormal calcifications.  Orders: -     DG Shoulder Left; Future -      Ambulatory referral to Physical Therapy  Return in about 6 weeks (around 11/11/2023) for shoulder pain.   ___________________________________________ Myrian Botello [name], MD, ABFM, CAQSM Primary Care and Sports Medicine [facility]

## 2023-09-30 NOTE — Patient Instructions (Signed)
  Medication Instructions:  Your physician recommends that you continue on your current medications as directed. Please refer to the Current Medication list given to you today. --If you need a refill on any your medications before your next appointment, please call your pharmacy first. If no refills are authorized on file call the office.--   Referrals/Procedures/Imaging: Xray and physical therapy   Follow-Up: Your next appointment:   Your physician recommends that you schedule a follow-up appointment in: 6-8 week follow up  with Dr. [name]  You will receive a text message or e-mail with a link to a survey about your care and experience with us  today! We would greatly appreciate your feedback!   Thanks for letting us  be apart of your health journey!!  Primary Care and Sports Medicine   Dr. Quintin sheerer Peru   We encourage you to activate your patient portal called MyChart.  Sign up information is provided on this After Visit Summary.  MyChart is used to connect with patients for Virtual Visits (Telemedicine).  Patients are able to view lab/test results, encounter notes, upcoming appointments, etc.  Non-urgent messages can be sent to your provider as well. To learn more about what you can do with MyChart, please visit --  [email or web address].

## 2023-10-01 ENCOUNTER — Ambulatory Visit (HOSPITAL_BASED_OUTPATIENT_CLINIC_OR_DEPARTMENT_OTHER): Admitting: Family Medicine

## 2023-10-02 ENCOUNTER — Ambulatory Visit: Payer: Self-pay

## 2023-10-02 DIAGNOSIS — M25512 Pain in left shoulder: Secondary | ICD-10-CM

## 2023-10-02 NOTE — Telephone Encounter (Signed)
**Note Sara-Identified via Obfuscation**  FYI Only or Action Required?: Action required by provider: update on patient condition. Patient is requesting an MRI and asking what medication she can take to help.  Patient was last seen in primary care on 09/30/2023 by Sara [name], Sara PARAS, MD.  Called Nurse Triage reporting Shoulder Pain.  Symptoms began several months ago.  Interventions attempted: OTC medications: Tylenol , Voltaren  gel.  Symptoms are: left shoulder pain radiates down left armstates about he same or worse and was severe yesterday.  Triage Disposition: Call PCP Within 24 Hours  Patient/caregiver understands and will follow disposition?: Yes         Copied from CRM [id number]. Topic: Clinical - Medical Advice >> Oct 02, 2023  8:05 AM Sara [name] wrote: Reason for CRM: Patient was in just a few days ago for left shoulder pain. Had an xray done that didn't show anything. States she is still having the pain and the nurse advised if still having to call and leave a message.  Patient can be reached at [phone] Reason for Disposition  [1] Caller has NON-URGENT question AND [2] triager unable to answer question  Answer Assessment - Initial Assessment Questions Patient is asking if Sara [name] would recommend her having an MRI. She states she already called her insurance and they told her it would be approved. She would like to have that done before her first physical therapy 10/27/23). Also she would like to know if there is something else medication he would recommend.   1. MAIN CONCERN OR SYMPTOM:  What is your main concern right now? What question do you have? What's the main symptom you're worried about? (e.g., fever, pain, redness, swelling)     Patient states yesterday she had pain from left shoulder (felt like the pain ran down the bone under her shoulder blade) radiating down her hand. Patient states last night she couldn't sleep in bed, she had to sleep in a recliner due to pain.  2. ONSET: When did the   left shoulder pain  start?     X 4 months, worsened over the past month.  3. BETTER-SAME-WORSE: Are you getting better, staying the same, or getting worse compared to how you felt at your last visit to the doctor (most recent medical visit)?     Steady if not worse pain,   4. VISIT DATE: When were you seen? (e.g., date)     09/30/23.  5. VISIT DOCTOR: What is the name of the doctor taking care of you now?     Sara [name].  6. VISIT DIAGNOSIS:  What was the main symptom or problem that you were seen for? Were you given a diagnosis?      Left shoulder pain, unspecified chronicity  7. VISIT TREATMENT: What treatment did you receive? (e.g., staples, sutures, splint, cast)     X rays. Physical therapy referral (she states she is scheduled for August 4th)  8. VISIT MEDICINES: Did the doctor order any new medicines for you to use? If Yes, ask: Have you filled the prescription and started taking the medicine?      No.  9. NEXT APPOINTMENT: Have you scheduled a follow-up appointment with your doctor?     11/19/23.  10. PAIN: Is there any pain? If Yes, ask: How bad is it?  (Scale 0-10; or none, mild, moderate, severe)       Yes, 3/10. She states yesterday was 10/10.   11. FEVER: Do you have a fever? If Yes, ask: What  is it, how was it measured  and when did it start?       No.  12. OTHER SYMPTOMS: Do you have any other symptoms?       No.  13. PREGNANCY: Is there any chance you are pregnant? When was your last menstrual period?       N/A.  Protocols used: Recent Medical Visit for Injury Follow-up Call-A-AH

## 2023-10-03 NOTE — Telephone Encounter (Signed)
 Patient notified to be by the phone

## 2023-10-03 NOTE — Addendum Note (Signed)
 Addended by: [name], QUINTIN J on: 10/03/2023 09:02 AM   Modules accepted: Orders

## 2023-10-06 ENCOUNTER — Other Ambulatory Visit (INDEPENDENT_AMBULATORY_CARE_PROVIDER_SITE_OTHER)

## 2023-10-06 ENCOUNTER — Ambulatory Visit (INDEPENDENT_AMBULATORY_CARE_PROVIDER_SITE_OTHER): Admitting: Family Medicine

## 2023-10-06 ENCOUNTER — Encounter (HOSPITAL_BASED_OUTPATIENT_CLINIC_OR_DEPARTMENT_OTHER): Payer: Self-pay | Admitting: Family Medicine

## 2023-10-06 VITALS — BP 149/66 | HR 64 | Ht 66.0 in | Wt 142.4 lb

## 2023-10-06 DIAGNOSIS — M25512 Pain in left shoulder: Secondary | ICD-10-CM

## 2023-10-06 NOTE — Progress Notes (Signed)
    Procedures performed today:    Procedure: Real-time Ultrasound Guided injection of the left glenohumeral joint Device: Samsung HS60  Verbal informed consent obtained.  Time-out conducted.  Noted no overlying erythema, induration, or other signs of local infection.  Skin prepped in a sterile fashion.  Local anesthesia: None.  With sterile technique and under real time ultrasound guidance: 1 cc Kenalog 40, 3 cc lidocaine  injected easily Completed without difficulty  Advised to call if fevers/chills, erythema, induration, drainage, or persistent bleeding.  Images permanently stored and available for review in PACS.  Impression: Technically successful ultrasound guided injection.  Independent interpretation of notes and tests performed by another provider:   None.  Brief History, Exam, Impression, and Recommendations:    BP (!) 149/66 (BP Location: Right Arm, Patient Position: Sitting, Cuff Size: Normal)   Pulse 64   Ht 5' 6 (1.676 m)   Wt 142 lb 6.4 oz (64.6 kg)   LMP  (LMP Unknown)   SpO2 100%   BMI 22.98 kg/m   Left shoulder pain, unspecified chronicity Assessment & Plan: Patient presents with gradual worsening of left shoulder pain.  She has had x-rays completed and these were generally unremarkable with no obvious joint or bony abnormality noted.  She has noticed increasing pain as well as progression in restriction of range of motion.  She has been utilizing Tylenol  to help with the pain.  Pain is negatively affecting sleep, feels that she can only sleep a few hours before pain will wake her up at night. She does have some restriction in range of motion, slightly worsened since last appointment.  Active and passive external rotation as well as forward flexion and abduction are reduced.  Pain is also elicited with all of these movements. We discussed considerations, does appear that patient is having some onset of adhesive capsulitis.  We reviewed options from a pain  management and treatment standpoint.  She would like to proceed with glenohumeral joint injection today, see procedure note above.  Cautioned on potential side effects and risks associated with procedure. Previously, we did order MRI and she has a scheduled for next week.  Advised that she can continue with this.  She does have physical therapy arranged in about 3 weeks, can also continue with this.  Would expect that injection provides improvement in symptoms and this should allow for her to work better with PT then otherwise.  Will plan for follow-up after she has started with physical therapy or sooner as needed  Orders: -     US  LIMITED JOINT SPACE STRUCTURES UP LEFT; Future   ___________________________________________ Nazaret Chea [name], MD, ABFM, CAQSM Primary Care and Sports Medicine [facility]

## 2023-10-06 NOTE — Telephone Encounter (Signed)
Scheduled 7/14

## 2023-10-06 NOTE — Assessment & Plan Note (Signed)
 Patient presents with gradual worsening of left shoulder pain.  She has had x-rays completed and these were generally unremarkable with no obvious joint or bony abnormality noted.  She has noticed increasing pain as well as progression in restriction of range of motion.  She has been utilizing Tylenol  to help with the pain.  Pain is negatively affecting sleep, feels that she can only sleep a few hours before pain will wake her up at night. She does have some restriction in range of motion, slightly worsened since last appointment.  Active and passive external rotation as well as forward flexion and abduction are reduced.  Pain is also elicited with all of these movements. We discussed considerations, does appear that patient is having some onset of adhesive capsulitis.  We reviewed options from a pain management and treatment standpoint.  She would like to proceed with glenohumeral joint injection today, see procedure note above.  Cautioned on potential side effects and risks associated with procedure. Previously, we did order MRI and she has a scheduled for next week.  Advised that she can continue with this.  She does have physical therapy arranged in about 3 weeks, can also continue with this.  Would expect that injection provides improvement in symptoms and this should allow for her to work better with PT then otherwise.  Will plan for follow-up after she has started with physical therapy or sooner as needed

## 2023-10-14 ENCOUNTER — Other Ambulatory Visit (INDEPENDENT_AMBULATORY_CARE_PROVIDER_SITE_OTHER)

## 2023-10-14 ENCOUNTER — Ambulatory Visit: Payer: Self-pay | Admitting: Gastroenterology

## 2023-10-14 ENCOUNTER — Ambulatory Visit: Admitting: Gastroenterology

## 2023-10-14 ENCOUNTER — Encounter: Payer: Self-pay | Admitting: Gastroenterology

## 2023-10-14 VITALS — BP 126/72 | HR 82 | Ht 66.0 in | Wt 141.0 lb

## 2023-10-14 DIAGNOSIS — Z7902 Long term (current) use of antithrombotics/antiplatelets: Secondary | ICD-10-CM | POA: Diagnosis not present

## 2023-10-14 DIAGNOSIS — K449 Diaphragmatic hernia without obstruction or gangrene: Secondary | ICD-10-CM | POA: Diagnosis not present

## 2023-10-14 DIAGNOSIS — K2270 Barrett's esophagus without dysplasia: Secondary | ICD-10-CM | POA: Diagnosis not present

## 2023-10-14 DIAGNOSIS — Z860100 Personal history of colon polyps, unspecified: Secondary | ICD-10-CM

## 2023-10-14 DIAGNOSIS — K219 Gastro-esophageal reflux disease without esophagitis: Secondary | ICD-10-CM | POA: Diagnosis not present

## 2023-10-14 DIAGNOSIS — I2510 Atherosclerotic heart disease of native coronary artery without angina pectoris: Secondary | ICD-10-CM

## 2023-10-14 LAB — COMPREHENSIVE METABOLIC PANEL WITH GFR
ALT: 13 U/L (ref 0–35)
AST: 12 U/L (ref 0–37)
Albumin: 4.1 g/dL (ref 3.5–5.2)
Alkaline Phosphatase: 64 U/L (ref 39–117)
BUN: 25 mg/dL — ABNORMAL HIGH (ref 6–23)
CO2: 25 meq/L (ref 19–32)
Calcium: 8.9 mg/dL (ref 8.4–10.5)
Chloride: 101 meq/L (ref 96–112)
Creatinine, Ser: 1.56 mg/dL — ABNORMAL HIGH (ref 0.40–1.20)
GFR: 32.16 mL/min — ABNORMAL LOW (ref >60.00)
Glucose, Bld: 101 mg/dL — ABNORMAL HIGH (ref 70–99)
Potassium: 3.9 meq/L (ref 3.5–5.1)
Sodium: 137 meq/L (ref 135–145)
Total Bilirubin: 0.7 mg/dL (ref 0.2–1.2)
Total Protein: 6.5 g/dL (ref 6.0–8.3)

## 2023-10-14 LAB — CBC WITH DIFFERENTIAL/PLATELET
Basophils Absolute: 0 K/uL (ref 0.0–0.1)
Basophils Relative: 0.1 % (ref 0.0–3.0)
Eosinophils Absolute: 0.1 K/uL (ref 0.0–0.7)
Eosinophils Relative: 0.9 % (ref 0.0–5.0)
HCT: 36 % (ref 36.0–46.0)
Hemoglobin: 12 g/dL (ref 12.0–15.0)
Lymphocytes Relative: 19.1 % (ref 12.0–46.0)
Lymphs Abs: 2.4 K/uL (ref 0.7–4.0)
MCHC: 33.3 g/dL (ref 30.0–36.0)
MCV: 92.3 fl (ref 78.0–100.0)
Monocytes Absolute: 1.1 K/uL — ABNORMAL HIGH (ref 0.1–1.0)
Monocytes Relative: 8.8 % (ref 3.0–12.0)
Neutro Abs: 8.8 K/uL — ABNORMAL HIGH (ref 1.4–7.7)
Neutrophils Relative %: 71.1 % (ref 43.0–77.0)
Platelets: 332 K/uL (ref 150.0–400.0)
RBC: 3.9 Mil/uL (ref 3.87–5.11)
RDW: 14 % (ref 11.5–15.5)
WBC: 12.4 K/uL — ABNORMAL HIGH (ref 4.0–10.5)

## 2023-10-14 NOTE — Patient Instructions (Addendum)
 _______________________________________________________  If your blood pressure at your visit was 140/90 or greater, please contact your primary care physician to follow up on this.  _______________________________________________________  If you are age 76 or older, your body mass index should be between 23-30. Your Body mass index is 22.76 kg/m. If this is out of the aforementioned range listed, please consider follow up with your Primary Care Provider.  If you are age 76 or younger, your body mass index should be between 19-25. Your Body mass index is 22.76 kg/m. If this is out of the aformentioned range listed, please consider follow up with your Primary Care Provider.   ________________________________________________________  The [facility] providers would like to encourage you to use MYCHART to communicate with providers for non-urgent requests or questions.  Due to long hold times on the telephone, sending your provider a message by [facility] may be a faster and more efficient way to get a response.  Please allow 48 business hours for a response.  Please remember that this is for non-urgent requests.  _______________________________________________________  Your provider has requested that you go to the basement level for lab work before leaving today. Press B on the elevator. The lab is located at the first door on the left as you exit the elevator.  Please call us  in [phone] to see about a repeat EGD/Colon  Thank you,  Dr. Lynnie Bring

## 2023-10-14 NOTE — Progress Notes (Signed)
**Note Sara-Identified via Obfuscation**  Chief Complaint:   Referring Provider:  de [name], Raymond J, MD      ASSESSMENT AND PLAN;  78 year old female with medical history as outlined below, presents with hematochezia and acute blood loss anemia after recent EGD/colonoscopy.  Underwent colonoscopy in 08/12/2023 which was notable for 15 mm transverse colon polyp removed via hot snare along with two 6-10 mm polyps removed from the ascending colon via cold snare, pandiverticulosis with mild SCAD in the sigmoid, and internal hemorrhoids. EGD that same day with long segment nondysplastic Barrett's Esophagus, large 6 cm hiatal hernia with evidence of prior fundoplication.  Had some scant BRB shortly after colonoscopy which resolved, then developed hematochezia shortly after resuming her Plavix  5 days post procedure. Rpt colon - neg, s/p clipping of post polypectomy sites.  No further bleeding.   IMP:   1) H/O polyps- s/p post polypectomy bleeding s/p repeat colonoscopy with clipping 09/02/2023 in setting of Plavix . 2) GERD with large HH, failed previous fundoplication. 3) Barrett's esophagus 4) Assoc CAD s/p DES to RCA (Feb 2024) on plavix .  Plan: -CBC, CMP today. -Rpt EGD/colon in 3 yrs pending clinic status at that time -Can decrease Protonix  40 every day. If breakthru, then BID. -FU PRN until then.   HPI:    Sara [name] is a 78 y.o. female  With CAD s/p RCA DES February 2024 on Plavix  (Nl EF), CKD 3, HTN, HLD, GERD with HH, failed fundoplication, Barrett's esophagus, history of polyps, osteoarthritis, osteoporosis  For follow-up. No further bleeding Has resumed Plavix   Denies having any GI complaints  We decided to reduce Protonix  due to history of osteoporosis.  However, if she has any breakthrough symptoms, she will resume twice daily.  Awaiting shoulder MRI  Advised to repeat EGD/colonoscopy in 3 years.  Although age would be prohibitive, she is doing well medically.  We will have her come back to office  prior to procedures to make sure she is doing good medically.     Past GI workup:  EGD/colonoscopy 08/20/2023 -Colonoscopy with a 15 mm polyp removed from the proximal transverse colon via hot snare, two 6-10 mm polyps in the proximal ascending colon removed with cold snare, pancolonic diverticulosis predominantly in the sigmoid colon, mild SCAD, and nonbleeding internal hemorrhoid.  Biopsy showed tubular adenomas.  -EGD for screening for Barrett's esophagus/surveillance with esophageal mucosal changes consistent with long segment Barrett's esophagus biopsied and a 6 cm hiatal. Bx- c/w Barrett's esophagus without dysplasia.    Repeat colonoscopy 09/02/2023 - Two ulcers in the proximal descending colon and in the proximal transverse colon. Clips ( MR conditional) were placed. Clip manufacturer: [employer]. - Diverticulosis in the sigmoid colon, in the descending colon and in the ascending colon. - Non- bleeding internal hemorrhoids. - The examined portion of the ileum was normal. - No specimens collected.  Past Medical History:  Diagnosis Date   Anemia 08/13/2022   As a result of bleed ulcer   Anxiety 07/2020   Aortic dilatation (HCC) 03/25/2022   38 mm noted on echo   Arthritis 11/2017   Barrett's esophagus    Blood transfusion without reported diagnosis 07/15/2022   Result of bleeding ulcer from plavix    CAD (coronary artery disease)    Cataract    had surgery   Chronic kidney disease, stage 3a (HCC)    Clotting disorder (HCC) 04/29/2022   Result of  being put on plavix  for heart stent   GERD (gastroesophageal reflux disease)    hx  of    HIATAL HERNIA    s/p surgical repair   Hyperlipidemia    HYPERTENSION    Kidney stone    1990s   Known medical problems 07/12/2022   LVH (left ventricular hypertrophy)    Osteoporosis    PAT (paroxysmal atrial tachycardia) (HCC)    Premature atrial contractions    Ulcer 07/12/22    Past Surgical History:  Procedure Laterality Date    ABDOMINAL HYSTERECTOMY  06/12/2012   CATARACT EXTRACTION  2009   both eyes   CHOLECYSTECTOMY  11/02/2012   Procedure: CHOLECYSTECTOMY;  Surgeon: Donnice KATHEE Lunger, MD;  Location: [facility];  Service: General;;   COLONOSCOPY N/A 09/02/2023   Procedure: COLONOSCOPY;  Surgeon: San Sandor GAILS, DO;  Location: [facility];  Service: Gastroenterology;  Laterality: N/A;   COLOSTOMY     CORONARY STENT INTERVENTION N/A 04/29/2022   Procedure: CORONARY STENT INTERVENTION;  Surgeon: Mady Bruckner, MD;  Location: [facility];  Service: Cardiovascular;  Laterality: N/A;   CORONARY ULTRASOUND/IVUS N/A 04/29/2022   Procedure: Intravascular Ultrasound/IVUS;  Surgeon: Mady Bruckner, MD;  Location: [facility];  Service: Cardiovascular;  Laterality: N/A;   CYSTOCELE REPAIR N/A 06/12/2012   Procedure: ANTERIOR REPAIR (CYSTOCELE);  Surgeon: Robbi JONELLE Render, MD;  Location: [facility];  Service: Gynecology;  Laterality: N/A;   ESOPHAGOGASTRODUODENOSCOPY (EGD) WITH PROPOFOL  N/A 07/13/2022   Procedure: ESOPHAGOGASTRODUODENOSCOPY (EGD) WITH PROPOFOL ;  Surgeon: Rollin Dover, MD;  Location: [facility];  Service: Gastroenterology;  Laterality: N/A;   EYE SURGERY  Cataract removal 2009   HERNIA REPAIR  2011   HIATAL HERNIA REPAIR  2011   LITHOTRIPSY     RIGHT/LEFT HEART CATH AND CORONARY ANGIOGRAPHY N/A 04/29/2022   Procedure: RIGHT/LEFT HEART CATH AND CORONARY ANGIOGRAPHY;  Surgeon: Mady Bruckner, MD;  Location: [facility];  Service: Cardiovascular;  Laterality: N/A;   TONSILLECTOMY  1968   UPPER GASTROINTESTINAL ENDOSCOPY     UPPER GI ENDOSCOPY  11/02/2012   Procedure: UPPER GI ENDOSCOPY;  Surgeon: Donnice KATHEE Lunger, MD;  Location: [facility];  Service: General;;   VAGINAL HYSTERECTOMY N/A 06/12/2012   Procedure: HYSTERECTOMY VAGINAL;  Surgeon: Robbi JONELLE Render, MD;  Location: [facility];  Service: Gynecology;  Laterality: N/A;    Family History  Problem Relation Age of Onset   Breast cancer  Mother 66       with mets    Cancer Mother    Hypertension Mother    Kidney disease Mother    Early death Father    Heart disease Father    Kidney disease Father    Colon cancer Paternal Aunt    Cancer Paternal Aunt    Cancer Paternal Uncle    Early death Paternal Uncle    Breast cancer Maternal Grandmother    Asthma Maternal Grandmother    Cancer Maternal Grandmother    Hypertension Other        Parent   Heart disease Other        parent, grandparent   Drug abuse Daughter    Esophageal cancer Neg Hx    Rectal cancer Neg Hx    Stomach cancer Neg Hx    Colon polyps Neg Hx     Social History   Tobacco Use   Smoking status: Never    Passive exposure: Never   Smokeless tobacco: Never  Vaping Use   Vaping status: Never Used  Substance Use Topics   Alcohol use: Never   Drug use: Never  Current Outpatient Medications  Medication Sig Dispense Refill   clopidogrel  (PLAVIX ) 75 MG tablet Take 1 tablet (75 mg total) by mouth daily. 90 tablet 3   cyanocobalamin  (VITAMIN B12) 1000 MCG tablet Take 1,000 mcg by mouth daily.     Ferrous Sulfate Dried (HIGH POTENCY IRON) 65 MG TABS      furosemide  (LASIX ) 20 MG tablet Take 1 tablet (20 mg total) by mouth daily as needed for edema. 90 tablet 1   Multiple Vitamin (MULTIVITAMIN WITH MINERALS) TABS Take 1 tablet by mouth daily.     nitroGLYCERIN  (NITROSTAT ) 0.4 MG SL tablet Place 1 tablet (0.4 mg total) under the tongue every 5 (five) minutes as needed for chest pain. 25 tablet 3   pantoprazole  (PROTONIX ) 40 MG tablet Take 1 tablet (40 mg total) by mouth 2 (two) times daily. 180 tablet 3   potassium chloride  SA (KLOR-CON  M) 20 MEQ tablet Take 1 tablet (20 mEq total) by mouth 2 (two) times daily for 3 days, THEN 1 tablet (20 mEq total) daily. 90 tablet 3   Probiotic Product (PROBIOTIC DAILY PO) Take 1 capsule by mouth daily.     rosuvastatin  (CRESTOR ) 5 MG tablet Take 1 tablet (5 mg total) by mouth daily. 90 tablet 3   verapamil   (CALAN -SR) 180 MG CR tablet Take 1 tablet (180 mg total) by mouth daily. 90 tablet 2   No current facility-administered medications for this visit.    Allergies  Allergen Reactions   Augmentin  [Amoxicillin -Pot Clavulanate] Diarrhea   Metoprolol  Other (See Comments)    Had prior to cor CT, felt very dizzy   Nsaids Other (See Comments)    Avoid due to diminished kidney function    Review of Systems:  Neg Having shoulder pain.     Physical Exam:    BP 126/72   Pulse 82   Ht 5' 6 (1.676 m)   Wt 141 lb (64 kg)   LMP  (LMP Unknown)   BMI 22.76 kg/m  Wt Readings from Last 3 Encounters:  10/14/23 141 lb (64 kg)  10/06/23 142 lb 6.4 oz (64.6 kg)  09/30/23 139 lb 3.2 oz (63.1 kg)   Constitutional:  Well-developed, in no acute distress. Psychiatric: Normal mood and affect. Behavior is normal. HEENT: Pupils normal.  Conjunctivae are normal. No scleral icterus. Cardiovascular: Normal rate, regular rhythm. No edema Pulmonary/chest: Effort normal and breath sounds normal. No wheezing, rales or rhonchi. Abdominal: Soft, nondistended. Nontender. Bowel sounds active throughout. There are no masses palpable. No hepatomegaly. Rectal: Deferred Neurological: Alert and oriented to person place and time. Skin: Skin is warm and dry. No rashes noted.  Data Reviewed: I have personally reviewed following labs and imaging studies  CBC:    Latest Ref Rng & Units 09/09/2023    7:32 AM 09/02/2023    7:59 AM 09/02/2023    5:14 AM  CBC  WBC 4.0 - 10.5 K/uL 6.2  5.7  6.1   Hemoglobin 12.0 - 15.0 g/dL 89.4  89.3  89.5   Hematocrit 36.0 - 46.0 % 31.3  32.5  32.4   Platelets 150.0 - 400.0 K/uL 279.0  265  254     CMP:    Latest Ref Rng & Units 09/15/2023    9:59 AM 09/02/2023    5:14 AM 09/01/2023   10:30 AM  CMP  Glucose 70 - 99 mg/dL 97  82  886   BUN 8 - 27 mg/dL 25  22  24    Creatinine 0.57 -  1.00 mg/dL 8.54  8.47  8.33   Sodium 134 - 144 mmol/L 137  140  137   Potassium 3.5 - 5.2  mmol/L 3.9  3.6  3.6   Chloride 96 - 106 mmol/L 99  102  100   CO2 20 - 29 mmol/L 21  26  25    Calcium  8.7 - 10.3 mg/dL 9.4  8.9  9.2   Total Protein 6.5 - 8.1 g/dL  6.2  6.9   Total Bilirubin 0.0 - 1.2 mg/dL  0.9  0.6   Alkaline Phos 38 - 126 U/L  68  78   AST 15 - 41 U/L  15  18   ALT 0 - 44 U/L  10  11        Radiology Studies: US  LIMITED JOINT SPACE STRUCTURES UP LEFT Result Date: 10/06/2023 Procedure: Real-time Ultrasound Guided injection of the left glenohumeral joint Device: Samsung [id number] Verbal informed consent obtained. Time-out conducted. Noted no overlying erythema, induration, or other signs of local infection. Skin prepped in a sterile fashion. Local anesthesia: None. With sterile technique and under real time ultrasound guidance: 1 cc Kenalog 40, 3 cc lidocaine  injected easily Completed without difficulty Advised to call if fevers/chills, erythema, induration, drainage, or persistent bleeding. Images permanently stored and available for review in PACS. Impression: Technically successful ultrasound guided injection.   DG Shoulder Left Result Date: 09/30/2023 EXAM: 1 VIEW XRAY OF THE LEFT SHOULDER 09/30/2023 10:31:42 AM COMPARISON: None available. CLINICAL HISTORY: Left shoulder pain, remote history of injury > 10 years ago. FINDINGS: BONES AND JOINTS: Glenohumeral joint is normally aligned. No acute fracture or dislocation. The [facility] joint is unremarkable in appearance. SOFT TISSUES: No abnormal calcifications. Visualized lung is unremarkable. IMPRESSION: 1. No significant abnormality. Electronically signed by: Pinkie Pebbles MD 09/30/2023 12:01 PM EDT RP Workstation: [id number]      Anselm Bring, MD 10/14/2023, 8:43 AM  Cc: Sara [name], Raymond J, MD

## 2023-10-15 ENCOUNTER — Ambulatory Visit
Admission: RE | Admit: 2023-10-15 | Discharge: 2023-10-15 | Disposition: A | Discharge status: Home / Self Care | Source: Ambulatory Visit | Attending: Family Medicine | Admitting: Family Medicine

## 2023-10-15 DIAGNOSIS — M67814 Other specified disorders of tendon, left shoulder: Secondary | ICD-10-CM | POA: Diagnosis not present

## 2023-10-15 DIAGNOSIS — M25512 Pain in left shoulder: Secondary | ICD-10-CM

## 2023-10-18 ENCOUNTER — Encounter (HOSPITAL_BASED_OUTPATIENT_CLINIC_OR_DEPARTMENT_OTHER): Payer: Self-pay | Admitting: Family Medicine

## 2023-10-23 ENCOUNTER — Ambulatory Visit: Payer: Self-pay

## 2023-10-23 DIAGNOSIS — D72829 Elevated white blood cell count, unspecified: Secondary | ICD-10-CM

## 2023-10-23 DIAGNOSIS — M25512 Pain in left shoulder: Secondary | ICD-10-CM

## 2023-10-23 NOTE — Telephone Encounter (Signed)
 FYI Only or Action Required?: Action required by provider: referral request.  Patient was last seen in primary care on 10/06/2023 by [name], [name], MD.  Called Nurse Triage reporting No chief complaint on file..  Symptoms began several days ago.  Interventions attempted: OTC medications: Tylenol , Rest, hydration, or home remedies, and Ice/heat application.  Symptoms are: gradually worsening.  Triage Disposition: See PCP When Office is Open (Within 3 Days)  Patient/caregiver understands and will follow disposition?: Yes Reason for Disposition  [1] MODERATE pain (e.g., interferes with normal activities) AND [2] present > 3 days  Answer Assessment - Initial Assessment Questions 1. ONSET: When did the pain start?     This past Monday  2. LOCATION: Where is the pain located?     Right Shoulder  3. PAIN: How bad is the pain? (Scale 1-10; or mild, moderate, severe)     Moderate to Severe  4. WORK OR EXERCISE: Has there been any recent work or exercise that involved this part of the body?     Denies  5. CAUSE: What do you think is causing the shoulder pain?     Unsure, maybe Frozen shoulder  6. OTHER SYMPTOMS: Do you have any other symptoms? (e.g., neck pain, swelling, rash, fever, numbness, weakness)     Popping,   7. PREGNANCY: Is there any chance you are pregnant? When was your last menstrual period?     No and No  Protocols used: Shoulder Pain-A-AH

## 2023-10-25 ENCOUNTER — Other Ambulatory Visit (HOSPITAL_BASED_OUTPATIENT_CLINIC_OR_DEPARTMENT_OTHER): Payer: Self-pay | Admitting: Cardiology

## 2023-10-27 ENCOUNTER — Ambulatory Visit (HOSPITAL_BASED_OUTPATIENT_CLINIC_OR_DEPARTMENT_OTHER): Admitting: Physical Therapy

## 2023-10-27 ENCOUNTER — Other Ambulatory Visit (HOSPITAL_BASED_OUTPATIENT_CLINIC_OR_DEPARTMENT_OTHER): Payer: Self-pay

## 2023-10-27 MED ORDER — ROSUVASTATIN CALCIUM 5 MG PO TABS
5.0000 mg | ORAL_TABLET | Freq: Every day | ORAL | 3 refills | Status: AC
  Filled 2023-10-27: qty 90, 90d supply, fill #0
  Filled 2024-01-18: qty 90, 90d supply, fill #1
  Filled 2024-04-20: qty 90, 90d supply, fill #2

## 2023-10-30 ENCOUNTER — Other Ambulatory Visit (HOSPITAL_BASED_OUTPATIENT_CLINIC_OR_DEPARTMENT_OTHER): Payer: Self-pay | Admitting: *Deleted

## 2023-10-30 DIAGNOSIS — D72829 Elevated white blood cell count, unspecified: Secondary | ICD-10-CM | POA: Diagnosis not present

## 2023-10-30 LAB — CBC WITH DIFFERENTIAL/PLATELET
Basophils Absolute: 0 x10E3/uL (ref 0.0–0.2)
Basos: 0 %
EOS (ABSOLUTE): 0.1 x10E3/uL (ref 0.0–0.4)
Eos: 2 %
Hematocrit: 35.1 % (ref 34.0–46.6)
Hemoglobin: 11.4 g/dL (ref 11.1–15.9)
Immature Grans (Abs): 0 x10E3/uL (ref 0.0–0.1)
Immature Granulocytes: 0 %
Lymphocytes Absolute: 1.8 x10E3/uL (ref 0.7–3.1)
Lymphs: 29 %
MCH: 30.8 pg (ref 26.6–33.0)
MCHC: 32.5 g/dL (ref 31.5–35.7)
MCV: 95 fL (ref 79–97)
Monocytes Absolute: 0.4 x10E3/uL (ref 0.1–0.9)
Monocytes: 7 %
Neutrophils Absolute: 3.8 x10E3/uL (ref 1.4–7.0)
Neutrophils: 62 %
Platelets: 219 x10E3/uL (ref 150–450)
RBC: 3.7 x10E6/uL — ABNORMAL LOW (ref 3.77–5.28)
RDW: 12.2 % (ref 11.7–15.4)
WBC: 6.2 x10E3/uL (ref 3.4–10.8)

## 2023-10-31 ENCOUNTER — Ambulatory Visit (HOSPITAL_BASED_OUTPATIENT_CLINIC_OR_DEPARTMENT_OTHER): Payer: Self-pay | Admitting: Family Medicine

## 2023-11-05 ENCOUNTER — Other Ambulatory Visit: Payer: Self-pay | Admitting: Family Medicine

## 2023-11-05 DIAGNOSIS — Z1231 Encounter for screening mammogram for malignant neoplasm of breast: Secondary | ICD-10-CM

## 2023-11-08 ENCOUNTER — Ambulatory Visit (HOSPITAL_BASED_OUTPATIENT_CLINIC_OR_DEPARTMENT_OTHER): Admitting: Physical Therapy

## 2023-11-12 ENCOUNTER — Encounter (HOSPITAL_BASED_OUTPATIENT_CLINIC_OR_DEPARTMENT_OTHER): Payer: Self-pay

## 2023-11-12 ENCOUNTER — Telehealth (HOSPITAL_BASED_OUTPATIENT_CLINIC_OR_DEPARTMENT_OTHER): Payer: Self-pay | Admitting: Family Medicine

## 2023-11-12 NOTE — Telephone Encounter (Signed)
 Copied from CRM #[id number]. Topic: General - Other >> Nov 12, 2023  3:15 PM Roselie BROCKS wrote: Reason for CRM: Patient was to be referred to [facility] and patient said they  have not received the order, the Orthopedics STAT fax number is [phone]   8.20.25 sent referral to stat fax [phone].

## 2023-11-17 ENCOUNTER — Ambulatory Visit (HOSPITAL_BASED_OUTPATIENT_CLINIC_OR_DEPARTMENT_OTHER): Payer: Self-pay | Admitting: Family Medicine

## 2023-11-19 ENCOUNTER — Ambulatory Visit (HOSPITAL_BASED_OUTPATIENT_CLINIC_OR_DEPARTMENT_OTHER): Admitting: Family Medicine

## 2023-11-19 ENCOUNTER — Telehealth: Payer: Self-pay | Admitting: Cardiology

## 2023-11-19 DIAGNOSIS — R600 Localized edema: Secondary | ICD-10-CM

## 2023-11-19 NOTE — Telephone Encounter (Signed)
 Called patient back about message. Patient stated she had been having swelling in her BLE and SOB, and she has been taking lasix  20 mg PRN tablet daily for the last 2 weeks. Patient would like to start taking it daily, due to when she is not on it she has BLE edema. Patient also knows her creatinine is high, so she is not sure if Dr. Jeffrie will agree to her taking lasix  daily instead of PRN. Will forward to Dr. Jeffrie for advisement.

## 2023-11-19 NOTE — Telephone Encounter (Signed)
 Pt c/o swelling: STAT is pt has developed SOB within 24 hours  How much weight have you gained and in what time span?  Not weight gain. Lost about 6lbs since last appointment, ?6/30  If swelling, where is the swelling located?  Feet   Are you currently taking a fluid pill?  Yes   Are you currently SOB?  No, but mentions she has been more SOB recently  Do you have a log of your daily weights (if so, list)?   Have you gained 3 pounds in a day or 5 pounds in a week?   Have you traveled recently?  None whatsoever.

## 2023-11-25 ENCOUNTER — Other Ambulatory Visit (HOSPITAL_BASED_OUTPATIENT_CLINIC_OR_DEPARTMENT_OTHER): Payer: Self-pay

## 2023-11-25 MED ORDER — FUROSEMIDE 20 MG PO TABS
20.0000 mg | ORAL_TABLET | Freq: Every day | ORAL | 3 refills | Status: AC
  Filled 2023-11-25: qty 90, 90d supply, fill #0
  Filled 2024-02-17: qty 90, 90d supply, fill #1

## 2023-11-25 NOTE — Telephone Encounter (Signed)
 Left message for patient to call back. Will inform her of Dr. Jeffrie message when she returns call.   Jeffrie Oneil BROCKS, MD to Me  Theotis Sharlet PARAS, RN   11/24/23  2:28 PM Yes, she may take her Lasix  20 mg on a daily basis. Repeat basic metabolic profile in 1 month. Oneil Jeffrie, MD

## 2023-11-25 NOTE — Telephone Encounter (Signed)
 Patient called back. Informed her of Dr. Jeffrie' advisement. Sent in Lasix  20 mg for patient to start taking daily. Patient will come in a month to get BMET.

## 2023-12-02 NOTE — Progress Notes (Unsigned)
   LILLETTE Ileana Collet, PhD, LAT, ATC acting as a scribe for Artist Lloyd, MD.  Sara [name] is a 78 y.o. female who presents to [facility] today for exacerbation of her R ankle pain. Pt was last seen by Dr. Lloyd on 04/03/23 and was advised to cont compression and HEP.   Today, pt reports ***. Pt locates pain to ***  Dx imaging: 06/07/19 R ankle XR             02/06/18 R ankle XR             01/30/23 R foot & ankle XR  Pertinent review of systems: ***  Relevant historical information: ***   Exam:  LMP  (LMP Unknown)  General: Well Developed, well nourished, and in no acute distress.   MSK: ***    Lab and Radiology Results No results found for this or any previous visit (from the past 72 hours). No results found.     Assessment and Plan: 78 y.o. female with ***   PDMP not reviewed this encounter. No orders of the defined types were placed in this encounter.  No orders of the defined types were placed in this encounter.    Discussed warning signs or symptoms. Please see discharge instructions. Patient expresses understanding.   ***

## 2023-12-03 ENCOUNTER — Encounter: Payer: Self-pay | Admitting: Family Medicine

## 2023-12-03 ENCOUNTER — Other Ambulatory Visit: Payer: Self-pay

## 2023-12-03 ENCOUNTER — Ambulatory Visit: Admitting: Family Medicine

## 2023-12-03 VITALS — BP 130/80 | HR 88 | Ht 66.0 in | Wt 144.0 lb

## 2023-12-03 DIAGNOSIS — M25571 Pain in right ankle and joints of right foot: Secondary | ICD-10-CM | POA: Diagnosis not present

## 2023-12-03 DIAGNOSIS — G8929 Other chronic pain: Secondary | ICD-10-CM | POA: Diagnosis not present

## 2023-12-03 NOTE — Patient Instructions (Addendum)
 Thank you for coming in today.   Recommend Cam Vannie Seaman from [facility] [address] [place], [place] [address]  Please work on the home exercises the athletic trainer went over with you:  View at [email or web address]-exercise-[email or web address] code [id number]  If not better, can consider an injection in 2 weeks.

## 2023-12-11 ENCOUNTER — Ambulatory Visit
Admission: RE | Admit: 2023-12-11 | Discharge: 2023-12-11 | Disposition: A | Discharge status: Home / Self Care | Source: Ambulatory Visit | Attending: Family Medicine | Admitting: Family Medicine

## 2023-12-11 DIAGNOSIS — Z1231 Encounter for screening mammogram for malignant neoplasm of breast: Secondary | ICD-10-CM

## 2023-12-17 DIAGNOSIS — M25512 Pain in left shoulder: Secondary | ICD-10-CM | POA: Diagnosis not present

## 2023-12-18 DIAGNOSIS — R600 Localized edema: Secondary | ICD-10-CM | POA: Diagnosis not present

## 2023-12-19 ENCOUNTER — Ambulatory Visit (HOSPITAL_BASED_OUTPATIENT_CLINIC_OR_DEPARTMENT_OTHER): Payer: Self-pay | Admitting: Family Medicine

## 2023-12-19 ENCOUNTER — Ambulatory Visit: Payer: Self-pay | Admitting: Cardiology

## 2023-12-19 LAB — BASIC METABOLIC PANEL WITH GFR
BUN/Creatinine Ratio: 13 (ref 12–28)
BUN: 20 mg/dL (ref 8–27)
CO2: 24 mmol/L (ref 20–29)
Calcium: 9.1 mg/dL (ref 8.7–10.3)
Chloride: 101 mmol/L (ref 96–106)
Creatinine, Ser: 1.55 mg/dL — ABNORMAL HIGH (ref 0.57–1.00)
Glucose: 88 mg/dL (ref 70–99)
Potassium: 4.1 mmol/L (ref 3.5–5.2)
Sodium: 140 mmol/L (ref 134–144)
eGFR: 35 mL/min/1.73 — ABNORMAL LOW (ref >59)

## 2024-01-08 DIAGNOSIS — M25512 Pain in left shoulder: Secondary | ICD-10-CM | POA: Diagnosis not present

## 2024-01-12 DIAGNOSIS — M25512 Pain in left shoulder: Secondary | ICD-10-CM | POA: Diagnosis not present

## 2024-01-16 ENCOUNTER — Ambulatory Visit (HOSPITAL_BASED_OUTPATIENT_CLINIC_OR_DEPARTMENT_OTHER): Admitting: Family Medicine

## 2024-01-19 DIAGNOSIS — M25512 Pain in left shoulder: Secondary | ICD-10-CM | POA: Diagnosis not present

## 2024-01-20 ENCOUNTER — Ambulatory Visit (INDEPENDENT_AMBULATORY_CARE_PROVIDER_SITE_OTHER): Admitting: Family Medicine

## 2024-01-20 VITALS — BP 101/64 | HR 76 | Ht 66.0 in | Wt 143.0 lb

## 2024-01-20 DIAGNOSIS — Z23 Encounter for immunization: Secondary | ICD-10-CM

## 2024-01-20 DIAGNOSIS — N1832 Chronic kidney disease, stage 3b: Secondary | ICD-10-CM

## 2024-01-20 DIAGNOSIS — I10 Essential (primary) hypertension: Secondary | ICD-10-CM

## 2024-01-20 DIAGNOSIS — E559 Vitamin D deficiency, unspecified: Secondary | ICD-10-CM | POA: Insufficient documentation

## 2024-01-20 NOTE — Assessment & Plan Note (Signed)
 She has questions today about vitamin D  as a different provider had told her about vitamin D  supplementation.  She found on her own that there is some discussion about vitamin D  impacting kidney function and so has been hesitant regarding dosage of vitamin D  supplement.  She has been taking 2000 international unit dose for a couple months now. Reviewed vitamin D  management in relation to CKD.  We can proceed with check vitamin D  level today. Once lab results received, we will we will review and provide further recommendations related to vitamin D  dosing

## 2024-01-20 NOTE — Progress Notes (Signed)
    Procedures performed today:    None.  Independent interpretation of notes and tests performed by another provider:   None.  Brief History, Exam, Impression, and Recommendations:    BP 101/64 (BP Location: Right Arm, Patient Position: Sitting, Cuff Size: Normal)   Pulse 76   Ht 5' 6 (1.676 m)   Wt 143 lb (64.9 kg)   LMP  (LMP Unknown)   SpO2 100%   BMI 23.08 kg/m   Vitamin D  deficiency Assessment & Plan: She has questions today about vitamin D  as a different provider had told her about vitamin D  supplementation.  She found on her own that there is some discussion about vitamin D  impacting kidney function and so has been hesitant regarding dosage of vitamin D  supplement.  She has been taking 2000 international unit dose for a couple months now. Reviewed vitamin D  management in relation to CKD.  We can proceed with check vitamin D  level today. Once lab results received, we will we will review and provide further recommendations related to vitamin D  dosing  Orders: -     VITAMIN D  25 Hydroxy (Vit-D Deficiency, Fractures)  Stage 3b chronic kidney disease (HCC) Assessment & Plan: History of CKD, labs have been stable since hospitalization.  We discussed impact of CKD on bone health and vitamin D  management.  Orders: -     VITAMIN D  25 Hydroxy (Vit-D Deficiency, Fractures)  Primary hypertension Assessment & Plan: Blood pressure at low end of normal range in office today.  She is currently asymptomatic.  Continues to hold losartan . Would be reasonable to continue with current medication regimen.  Recommend intermittent monitoring of blood pressure at home.  Advised that primary concern would be if starting to develop symptoms related to low blood pressure which she does not have currently. We will continue with monitoring in the office.  Recommend follow-up with cardiology as scheduled.   Immunization due -     Flu vaccine HIGH DOSE PF(Fluzone Trivalent)  Return in about 4  months (around 05/22/2024).   ___________________________________________ Martine Bleecker [place], MD, ABFM, CAQSM Primary Care and Sports [facility]

## 2024-01-20 NOTE — Assessment & Plan Note (Signed)
 Blood pressure at low end of normal range in office today.  She is currently asymptomatic.  Continues to hold losartan . Would be reasonable to continue with current medication regimen.  Recommend intermittent monitoring of blood pressure at home.  Advised that primary concern would be if starting to develop symptoms related to low blood pressure which she does not have currently. We will continue with monitoring in the office.  Recommend follow-up with cardiology as scheduled.

## 2024-01-20 NOTE — Assessment & Plan Note (Signed)
 History of CKD, labs have been stable since hospitalization.  We discussed impact of CKD on bone health and vitamin D  management.

## 2024-01-21 LAB — VITAMIN D 25 HYDROXY (VIT D DEFICIENCY, FRACTURES): Vit D, 25-Hydroxy: 53.1 ng/mL (ref 30.0–100.0)

## 2024-01-23 ENCOUNTER — Ambulatory Visit (HOSPITAL_BASED_OUTPATIENT_CLINIC_OR_DEPARTMENT_OTHER): Payer: Self-pay | Admitting: Family Medicine

## 2024-03-26 ENCOUNTER — Encounter (HOSPITAL_BASED_OUTPATIENT_CLINIC_OR_DEPARTMENT_OTHER): Payer: Self-pay | Admitting: Family Medicine

## 2024-04-05 ENCOUNTER — Encounter: Payer: Self-pay | Admitting: *Deleted

## 2024-04-06 ENCOUNTER — Telehealth (HOSPITAL_BASED_OUTPATIENT_CLINIC_OR_DEPARTMENT_OTHER): Payer: Self-pay

## 2024-04-06 NOTE — Telephone Encounter (Signed)
"  ° °  Pre-operative Risk Assessment    Patient Name: Sara [name]  DOB: 01/28/1946 MRN: [id number]   Date of last office visit: 09/22/23 with Dr. Jeffrie Date of next office visit: None  Request for Surgical Clearance    Procedure:  Dental Extraction - Amount of Teeth to be Pulled:  ELEVEN teeth total (5 surgical and 7 simple)   Date of Surgery:  Clearance TBD                                 Surgeon:  Dr. Norleen Greet Surgeon's Group or Practice Name:  [facility]  Phone number:  [phone] Fax number:  [phone]   Type of Clearance Requested:   - Medical  - Pharmacy:  Hold Clopidogrel  (Plavix ) -does not specify   Type of Anesthesia:  Lidocaine    Additional requests/questions:  None  SignedPatrcia Iverson CROME   04/06/2024, 3:09 PM   "

## 2024-04-06 NOTE — Telephone Encounter (Signed)
 Hi Dr. Jeffrie, she has a history of PCI with a stent to her RCA that is 32mm in length. Are you able to weigh in regarding holding Plavix  prior to extraction of 11 teeth? Please route your reply to [facility]. Thank you so much!

## 2024-04-09 ENCOUNTER — Telehealth (HOSPITAL_BASED_OUTPATIENT_CLINIC_OR_DEPARTMENT_OTHER): Payer: Self-pay | Admitting: *Deleted

## 2024-04-09 NOTE — Telephone Encounter (Signed)
 Pt has been scheduled tele preop appt 04/15/24. Med rec and consent are done.      Patient Consent for Virtual Visit        [name] has provided verbal consent on 04/09/2024 for a virtual visit (video or telephone).   CONSENT FOR VIRTUAL VISIT FOR:  Sara [name]  By participating in this virtual visit I agree to the following:  I hereby voluntarily request, consent and authorize [facility] and its employed or contracted physicians, physician assistants, nurse practitioners or other licensed health care professionals (the Practitioner), to provide me with telemedicine health care services (the Services) as deemed necessary by the treating Practitioner. I acknowledge and consent to receive the Services by the Practitioner via telemedicine. I understand that the telemedicine visit will involve communicating with the Practitioner through live audiovisual communication technology and the disclosure of certain medical information by electronic transmission. I acknowledge that I have been given the opportunity to request an in-person assessment or other available alternative prior to the telemedicine visit and am voluntarily participating in the telemedicine visit.  I understand that I have the right to withhold or withdraw my consent to the use of telemedicine in the course of my care at any time, without affecting my right to future care or treatment, and that the Practitioner or I may terminate the telemedicine visit at any time. I understand that I have the right to inspect all information obtained and/or recorded in the course of the telemedicine visit and may receive copies of available information for a reasonable fee.  I understand that some of the potential risks of receiving the Services via telemedicine include:  Delay or interruption in medical evaluation due to technological equipment failure or disruption; Information transmitted may not be sufficient (e.g.  poor resolution of images) to allow for appropriate medical decision making by the Practitioner; and/or  In rare instances, security protocols could fail, causing a breach of personal health information.  Furthermore, I acknowledge that it is my responsibility to provide information about my medical history, conditions and care that is complete and accurate to the best of my ability. I acknowledge that Practitioner's advice, recommendations, and/or decision may be based on factors not within their control, such as incomplete or inaccurate data provided by me or distortions of diagnostic images or specimens that may result from electronic transmissions. I understand that the practice of medicine is not an exact science and that Practitioner makes no warranties or guarantees regarding treatment outcomes. I acknowledge that a copy of this consent can be made available to me via my patient portal [facility] MyChart), or I can request a printed copy by calling the office of [facility].    I understand that my insurance will be billed for this visit.   I have read or had this consent read to me. I understand the contents of this consent, which adequately explains the benefits and risks of the Services being provided via telemedicine.  I have been provided ample opportunity to ask questions regarding this consent and the Services and have had my questions answered to my satisfaction. I give my informed consent for the services to be provided through the use of telemedicine in my medical care

## 2024-04-09 NOTE — Telephone Encounter (Signed)
 Pt has been scheduled tele preop appt 04/15/24. Med rec and consent are done.

## 2024-04-09 NOTE — Telephone Encounter (Signed)
" ° °  Name: Sara [name]  DOB: 05-Feb-1946  MRN: [id number]  Primary Cardiologist: Oneil Parchment, MD   Preoperative team, please contact this patient and set up a phone call appointment for further preoperative risk assessment. Please obtain consent and complete medication review. Thank you for your help.  I confirm that guidance regarding antiplatelet and oral anticoagulation therapy has been completed and, if necessary, noted below: - Per Dr. Parchment, okay to hold Plavix  for procedure.   I also confirmed the patient resides in the state of [place] . As per [facility] Medical Board telemedicine laws, the patient must reside in the state in which the provider is licensed.   Patt Steinhardt E Tunis Gentle, PA-C 04/09/2024, 7:55 AM [facility]    "

## 2024-04-15 ENCOUNTER — Ambulatory Visit: Attending: Cardiology | Admitting: Student

## 2024-04-15 DIAGNOSIS — Z01810 Encounter for preprocedural cardiovascular examination: Secondary | ICD-10-CM | POA: Diagnosis not present

## 2024-04-15 NOTE — Progress Notes (Signed)
 "   Virtual Visit via Telephone Note   Because of Annslee Diane Nong's co-morbid illnesses, she is at least at moderate risk for complications without adequate follow up.  This format is felt to be most appropriate for this patient at this time.  The patient did not have access to video technology/had technical difficulties with video requiring transitioning to audio format only (telephone).  All issues noted in this document were discussed and addressed.  No physical exam could be performed with this format.  Please refer to the patient's chart for her consent to telehealth for [facility].  Evaluation Performed:  Preoperative cardiovascular risk assessment _____________   Date:  04/15/2024   Patient ID:  Ladeana Laplant, DOB 11/05/1945, MRN [id number] Patient Location:  Home Provider location:   Office  Primary Care Provider:  [place], [name], MD  Primary Cardiologist:  [facility] Providers Cardiologist:  Oneil Parchment, MD Electrophysiologist:  Elspeth Sage, MD (Inactive)    Chief Complaint / Patient Profile   79 y.o. y/o female with a h/o CAD s/p PCI with DES to RCA February 2024, palpitations/PSVT, hypertension, hyperlipidemia, GERD, diaphragmatic hernia s/p repair 2011, CKD stage III who is pending 11 dental extractions by Dr. Darrel and presents today for telephonic preoperative cardiovascular risk assessment.  History of Present Illness    Cece Milhouse is a 79 y.o. female who presents via audio/video conferencing for a telehealth visit today.  Pt was last seen in cardiology clinic on 09/22/2023 by Dr. Parchment.  At that time Roxan Yamamoto was stable from a cardiac standpoint. She was dealing with increased GERD symptoms with weight loss, laryngitis and dyspnea managed with pantoprazole . She was working with GI. The patient is now pending procedure as outlined above. Since her last visit, she is doing well. Patient denies shortness of breath, dyspnea  on exertion, lower extremity edema, orthopnea or PND. She tends to have lower extremity edema in the warmer months managed with Lasix . She has not needed Lasix  since the weather has cooled off. No chest pain, pressure, or tightness. No palpitations.  She is not experiencing any lightheadedness, dizziness, presyncope or syncope. Her GERD symptoms have improved. She is active walking for exercise 45-60 minutes 4 days a week and performing PT exercises for her shoulder.   Past Medical History    Past Medical History:  Diagnosis Date   Anemia 08/13/2022   As a result of bleed ulcer   Anxiety 07/2020   Aortic dilatation 03/25/2022   38 mm noted on echo   Arthritis 11/2017   Barrett's esophagus    Blood transfusion without reported diagnosis 07/15/2022   Result of bleeding ulcer from plavix    CAD (coronary artery disease)    Cataract    had surgery   Chronic kidney disease, stage 3a (HCC)    Clotting disorder 04/29/2022   Result of  being put on plavix  for heart stent   GERD (gastroesophageal reflux disease)    hx of    HIATAL HERNIA    s/p surgical repair   Hyperlipidemia    HYPERTENSION    Kidney stone    1990s   Known medical problems 07/12/2022   LVH (left ventricular hypertrophy)    Osteoporosis    PAT (paroxysmal atrial tachycardia)    Premature atrial contractions    Ulcer 07/12/22   Past Surgical History:  Procedure Laterality Date   ABDOMINAL HYSTERECTOMY  06/12/2012   CATARACT EXTRACTION  2009   both  eyes   CHOLECYSTECTOMY  11/02/2012   Procedure: CHOLECYSTECTOMY;  Surgeon: Donnice KATHEE Lunger, MD;  Location: [facility];  Service: General;;   COLONOSCOPY N/A 09/02/2023   Procedure: COLONOSCOPY;  Surgeon: San Sandor GAILS, DO;  Location: [facility];  Service: Gastroenterology;  Laterality: N/A;   COLOSTOMY     CORONARY STENT INTERVENTION N/A 04/29/2022   Procedure: CORONARY STENT INTERVENTION;  Surgeon: Mady Bruckner, MD;  Location: [facility];  Service:  Cardiovascular;  Laterality: N/A;   CORONARY ULTRASOUND/IVUS N/A 04/29/2022   Procedure: Intravascular Ultrasound/IVUS;  Surgeon: Mady Bruckner, MD;  Location: [facility];  Service: Cardiovascular;  Laterality: N/A;   CYSTOCELE REPAIR N/A 06/12/2012   Procedure: ANTERIOR REPAIR (CYSTOCELE);  Surgeon: Robbi JONELLE Render, MD;  Location: [facility];  Service: Gynecology;  Laterality: N/A;   ESOPHAGOGASTRODUODENOSCOPY (EGD) WITH PROPOFOL  N/A 07/13/2022   Procedure: ESOPHAGOGASTRODUODENOSCOPY (EGD) WITH PROPOFOL ;  Surgeon: Rollin Dover, MD;  Location: [facility];  Service: Gastroenterology;  Laterality: N/A;   EYE SURGERY  Cataract removal 2009   HERNIA REPAIR  2011   HIATAL HERNIA REPAIR  2011   LITHOTRIPSY     RIGHT/LEFT HEART CATH AND CORONARY ANGIOGRAPHY N/A 04/29/2022   Procedure: RIGHT/LEFT HEART CATH AND CORONARY ANGIOGRAPHY;  Surgeon: Mady Bruckner, MD;  Location: [facility];  Service: Cardiovascular;  Laterality: N/A;   TONSILLECTOMY  1968   UPPER GASTROINTESTINAL ENDOSCOPY     UPPER GI ENDOSCOPY  11/02/2012   Procedure: UPPER GI ENDOSCOPY;  Surgeon: Donnice KATHEE Lunger, MD;  Location: [facility];  Service: General;;   VAGINAL HYSTERECTOMY N/A 06/12/2012   Procedure: HYSTERECTOMY VAGINAL;  Surgeon: Robbi JONELLE Render, MD;  Location: [facility];  Service: Gynecology;  Laterality: N/A;    Allergies  Allergies[1]  Home Medications    Prior to Admission medications  Medication Sig Start Date End Date Taking? Authorizing Provider  clopidogrel  (PLAVIX ) 75 MG tablet Take 1 tablet (75 mg total) by mouth daily. 06/02/23   Lucien Orren SAILOR, PA-C  cyanocobalamin  (VITAMIN B12) 1000 MCG tablet Take 1,000 mcg by mouth daily.    [provider]  furosemide  (LASIX ) 20 MG tablet Take 1 tablet (20 mg total) by mouth daily. Patient taking differently: Take 20 mg by mouth daily. PER PT TAKES PRN AT THIS TIME.....SABRACMF 11/25/23   Jeffrie Oneil BROCKS, MD  Multiple Vitamin (MULTIVITAMIN WITH MINERALS)  TABS Take 1 tablet by mouth daily.    [provider]  nitroGLYCERIN  (NITROSTAT ) 0.4 MG SL tablet Place 1 tablet (0.4 mg total) under the tongue every 5 (five) minutes as needed for chest pain. 06/02/23 04/09/24  Lucien Orren SAILOR, PA-C  pantoprazole  (PROTONIX ) 40 MG tablet Take 1 tablet (40 mg total) by mouth 2 (two) times daily. 08/04/23   Charlanne Groom, MD  Probiotic Product (PROBIOTIC DAILY PO) Take 1 capsule by mouth daily.    [provider]  rosuvastatin  (CRESTOR ) 5 MG tablet Take 1 tablet (5 mg total) by mouth daily. 10/27/23   Jeffrie Oneil BROCKS, MD  verapamil  (CALAN -SR) 180 MG CR tablet Take 1 tablet (180 mg total) by mouth daily. 09/10/23   Lelon Glendia DASEN, PA-C    Physical Exam    Vital Signs:  Diamonds Lippard does not have vital signs available for review today.  Given telephonic nature of communication, physical exam is limited. AAOx3. NAD. Normal affect.  Speech and respirations are unlabored.   Assessment & Plan    Preoperative cardiovascular risk assessment. 11 dental extractions by  Dr. Darrel.  Chart reviewed as part of pre-operative protocol coverage. According to the RCRI, patient has a 0.9% risk of MACE. Patient reports activity equivalent to >4.0 METS (walks for exercise 45-60 minutes 4 days a week, performs PT exercises for shoulder).   Given past medical history and time since last visit, based on ACC/AHA guidelines, Offie Diane Chubbuck would be at acceptable risk for the planned procedure without further cardiovascular testing.   Patient was advised that if she develops new symptoms prior to surgery to contact our office to arrange a follow-up appointment.  she verbalized understanding.  Per office protocol and Dr. Jeffrie, he may hold Plavix  for 5 days prior to procedure and should resume as soon as hemodynamically stable postoperatively.    I will route this recommendation to the requesting party via Epic fax function.  Please call with  questions.  Time:   Today, I have spent 6 minutes with the patient with telehealth technology discussing medical history, symptoms, and management plan.     Barnie Hila, NP  04/15/2024, 7:49 AM     [1]  Allergies Allergen Reactions   Augmentin  [Amoxicillin -Pot Clavulanate] Diarrhea   Metoprolol  Other (See Comments)    Had prior to cor CT, felt very dizzy   Nsaids Other (See Comments)    Avoid due to diminished kidney function   "

## 2024-04-27 NOTE — Telephone Encounter (Signed)
 Clearance has been refax to requesting office

## 2024-05-21 ENCOUNTER — Ambulatory Visit (HOSPITAL_BASED_OUTPATIENT_CLINIC_OR_DEPARTMENT_OTHER): Admitting: Family Medicine

## 2024-05-24 ENCOUNTER — Ambulatory Visit (HOSPITAL_BASED_OUTPATIENT_CLINIC_OR_DEPARTMENT_OTHER): Admitting: Family Medicine

## 2024-05-25 ENCOUNTER — Ambulatory Visit (HOSPITAL_BASED_OUTPATIENT_CLINIC_OR_DEPARTMENT_OTHER)
